# Patient Record
Sex: Male | Born: 1945 | Race: White | Hispanic: No | Marital: Single | Smoking: Light tobacco smoker
Health system: Southern US, Community
[De-identification: ages and names within clinical notes are randomized; demographics above are authoritative.]

## PROBLEM LIST (undated history)

## (undated) DIAGNOSIS — M069 Rheumatoid arthritis, unspecified: Secondary | ICD-10-CM

## (undated) DIAGNOSIS — N529 Male erectile dysfunction, unspecified: Secondary | ICD-10-CM

## (undated) DIAGNOSIS — R569 Unspecified convulsions: Secondary | ICD-10-CM

## (undated) DIAGNOSIS — K219 Gastro-esophageal reflux disease without esophagitis: Secondary | ICD-10-CM

## (undated) DIAGNOSIS — I4891 Unspecified atrial fibrillation: Secondary | ICD-10-CM

## (undated) DIAGNOSIS — I499 Cardiac arrhythmia, unspecified: Secondary | ICD-10-CM

## (undated) DIAGNOSIS — G20 Parkinson's disease: Secondary | ICD-10-CM

## (undated) DIAGNOSIS — B0229 Other postherpetic nervous system involvement: Secondary | ICD-10-CM

## (undated) DIAGNOSIS — M48061 Spinal stenosis, lumbar region without neurogenic claudication: Secondary | ICD-10-CM

## (undated) DIAGNOSIS — R972 Elevated prostate specific antigen [PSA]: Secondary | ICD-10-CM

## (undated) HISTORY — DX: Unspecified convulsions: R56.9

## (undated) HISTORY — DX: Rheumatoid arthritis, unspecified: M06.9

## (undated) HISTORY — DX: Gastro-esophageal reflux disease without esophagitis: K21.9

## (undated) HISTORY — DX: Elevated prostate specific antigen (PSA): R97.20

## (undated) HISTORY — DX: Spinal stenosis, lumbar region without neurogenic claudication: M48.061

## (undated) HISTORY — PX: COLONOSCOPY: SHX174

## (undated) HISTORY — DX: Other postherpetic nervous system involvement: B02.29

## (undated) HISTORY — DX: Male erectile dysfunction, unspecified: N52.9

## (undated) HISTORY — PX: AMPUTATION: SHX166

## (undated) HISTORY — DX: Parkinson's disease: G20

---

## 2004-04-03 ENCOUNTER — Ambulatory Visit: Payer: Self-pay | Admitting: Internal Medicine

## 2004-04-11 ENCOUNTER — Ambulatory Visit: Payer: Self-pay | Admitting: Internal Medicine

## 2005-05-01 ENCOUNTER — Ambulatory Visit: Payer: Self-pay | Admitting: Internal Medicine

## 2005-05-08 ENCOUNTER — Ambulatory Visit: Payer: Self-pay | Admitting: Internal Medicine

## 2006-06-26 ENCOUNTER — Ambulatory Visit: Payer: Self-pay | Admitting: Internal Medicine

## 2006-06-26 LAB — CONVERTED CEMR LAB
Albumin: 3.9 g/dL (ref 3.5–5.2)
Basophils Absolute: 0 10*3/uL (ref 0.0–0.1)
Bilirubin, Direct: 0.1 mg/dL (ref 0.0–0.3)
Chloride: 106 meq/L (ref 96–112)
Cholesterol: 184 mg/dL (ref 0–200)
Eosinophils Absolute: 0.2 10*3/uL (ref 0.0–0.6)
Eosinophils Relative: 3.2 % (ref 0.0–5.0)
GFR calc Af Amer: 99 mL/min
GFR calc non Af Amer: 82 mL/min
Glucose, Bld: 104 mg/dL — ABNORMAL HIGH (ref 70–99)
HCT: 44.6 % (ref 39.0–52.0)
Lymphocytes Relative: 26.7 % (ref 12.0–46.0)
MCHC: 33.8 g/dL (ref 30.0–36.0)
MCV: 88.9 fL (ref 78.0–100.0)
Monocytes Absolute: 0.6 10*3/uL (ref 0.2–0.7)
Neutro Abs: 4.3 10*3/uL (ref 1.4–7.7)
Neutrophils Relative %: 61.5 % (ref 43.0–77.0)
PSA: 1.07 ng/mL (ref 0.10–4.00)
Potassium: 4.2 meq/L (ref 3.5–5.1)
RBC: 5.02 M/uL (ref 4.22–5.81)
Sodium: 139 meq/L (ref 135–145)
TSH: 1.34 microintl units/mL (ref 0.35–5.50)
Total CHOL/HDL Ratio: 3.7

## 2006-07-03 ENCOUNTER — Ambulatory Visit: Payer: Self-pay | Admitting: Internal Medicine

## 2007-03-10 ENCOUNTER — Telehealth (INDEPENDENT_AMBULATORY_CARE_PROVIDER_SITE_OTHER): Payer: Self-pay | Admitting: *Deleted

## 2007-03-12 ENCOUNTER — Telehealth: Payer: Self-pay | Admitting: Internal Medicine

## 2007-06-24 ENCOUNTER — Emergency Department (HOSPITAL_COMMUNITY): Admission: EM | Admit: 2007-06-24 | Discharge: 2007-06-24 | Payer: Self-pay | Admitting: Emergency Medicine

## 2007-06-25 ENCOUNTER — Telehealth (INDEPENDENT_AMBULATORY_CARE_PROVIDER_SITE_OTHER): Payer: Self-pay | Admitting: *Deleted

## 2007-06-28 ENCOUNTER — Telehealth: Payer: Self-pay | Admitting: Internal Medicine

## 2007-06-29 ENCOUNTER — Ambulatory Visit: Payer: Self-pay | Admitting: Internal Medicine

## 2007-06-29 DIAGNOSIS — R972 Elevated prostate specific antigen [PSA]: Secondary | ICD-10-CM

## 2007-06-29 DIAGNOSIS — E785 Hyperlipidemia, unspecified: Secondary | ICD-10-CM | POA: Insufficient documentation

## 2007-06-29 HISTORY — DX: Elevated prostate specific antigen (PSA): R97.20

## 2007-06-29 LAB — CONVERTED CEMR LAB
Cholesterol: 155 mg/dL (ref 0–200)
HDL: 34.2 mg/dL — ABNORMAL LOW (ref >39.0)
Triglycerides: 47 mg/dL (ref 0–149)
VLDL: 9 mg/dL (ref 0–40)

## 2007-07-01 ENCOUNTER — Ambulatory Visit: Payer: Self-pay | Admitting: Internal Medicine

## 2007-07-01 DIAGNOSIS — R569 Unspecified convulsions: Secondary | ICD-10-CM | POA: Insufficient documentation

## 2007-07-01 DIAGNOSIS — K219 Gastro-esophageal reflux disease without esophagitis: Secondary | ICD-10-CM | POA: Insufficient documentation

## 2007-07-01 DIAGNOSIS — K209 Esophagitis, unspecified without bleeding: Secondary | ICD-10-CM | POA: Insufficient documentation

## 2007-07-01 HISTORY — DX: Gastro-esophageal reflux disease without esophagitis: K21.9

## 2007-07-01 HISTORY — DX: Unspecified convulsions: R56.9

## 2007-07-06 ENCOUNTER — Telehealth: Payer: Self-pay | Admitting: Internal Medicine

## 2007-07-07 ENCOUNTER — Telehealth: Payer: Self-pay | Admitting: Internal Medicine

## 2007-07-07 ENCOUNTER — Ambulatory Visit (HOSPITAL_COMMUNITY): Admission: RE | Admit: 2007-07-07 | Discharge: 2007-07-07 | Payer: Self-pay | Admitting: Internal Medicine

## 2007-07-12 ENCOUNTER — Encounter: Payer: Self-pay | Admitting: Internal Medicine

## 2007-07-13 ENCOUNTER — Telehealth: Payer: Self-pay | Admitting: Internal Medicine

## 2007-07-19 ENCOUNTER — Telehealth: Payer: Self-pay | Admitting: Internal Medicine

## 2007-07-23 ENCOUNTER — Encounter: Payer: Self-pay | Admitting: Internal Medicine

## 2007-08-26 ENCOUNTER — Telehealth (INDEPENDENT_AMBULATORY_CARE_PROVIDER_SITE_OTHER): Payer: Self-pay | Admitting: *Deleted

## 2007-09-13 ENCOUNTER — Ambulatory Visit: Payer: Self-pay | Admitting: Internal Medicine

## 2007-09-13 DIAGNOSIS — B0229 Other postherpetic nervous system involvement: Secondary | ICD-10-CM | POA: Insufficient documentation

## 2007-09-13 DIAGNOSIS — B029 Zoster without complications: Secondary | ICD-10-CM | POA: Insufficient documentation

## 2007-09-13 HISTORY — DX: Other postherpetic nervous system involvement: B02.29

## 2007-12-01 ENCOUNTER — Ambulatory Visit: Payer: Self-pay | Admitting: Internal Medicine

## 2007-12-01 DIAGNOSIS — L259 Unspecified contact dermatitis, unspecified cause: Secondary | ICD-10-CM | POA: Insufficient documentation

## 2007-12-13 ENCOUNTER — Telehealth: Payer: Self-pay | Admitting: Family Medicine

## 2008-01-11 ENCOUNTER — Telehealth: Payer: Self-pay | Admitting: Internal Medicine

## 2008-06-28 ENCOUNTER — Ambulatory Visit: Payer: Self-pay | Admitting: Internal Medicine

## 2008-06-28 LAB — CONVERTED CEMR LAB
Alkaline Phosphatase: 49 units/L (ref 39–117)
Basophils Absolute: 0.1 10*3/uL (ref 0.0–0.1)
Bilirubin Urine: NEGATIVE
Bilirubin, Direct: 0.1 mg/dL (ref 0.0–0.3)
Blood in Urine, dipstick: NEGATIVE
Calcium: 8.8 mg/dL (ref 8.4–10.5)
Cholesterol: 164 mg/dL (ref 0–200)
GFR calc Af Amer: 72 mL/min
GFR calc non Af Amer: 60 mL/min
Glucose, Urine, Semiquant: NEGATIVE
HCT: 35.2 % — ABNORMAL LOW (ref 39.0–52.0)
HDL: 32.7 mg/dL — ABNORMAL LOW (ref >39.0)
Hemoglobin: 11.7 g/dL — ABNORMAL LOW (ref 13.0–17.0)
Ketones, urine, test strip: NEGATIVE
LDL Cholesterol: 117 mg/dL — ABNORMAL HIGH (ref 0–99)
MCHC: 33.3 g/dL (ref 30.0–36.0)
Monocytes Absolute: 0.6 10*3/uL (ref 0.1–1.0)
Neutro Abs: 3.6 10*3/uL (ref 1.4–7.7)
Nitrite: NEGATIVE
Platelets: 214 10*3/uL (ref 150–400)
Potassium: 3.9 meq/L (ref 3.5–5.1)
Protein, U semiquant: NEGATIVE
RDW: 14.2 % (ref 11.5–14.6)
Sodium: 142 meq/L (ref 135–145)
Specific Gravity, Urine: 1.015
TSH: 1.61 microintl units/mL (ref 0.35–5.50)
Total Bilirubin: 0.8 mg/dL (ref 0.3–1.2)
Total CHOL/HDL Ratio: 5
Triglycerides: 70 mg/dL (ref 0–149)
Urobilinogen, UA: 0.2
pH: 5

## 2008-07-03 ENCOUNTER — Ambulatory Visit: Payer: Self-pay | Admitting: Internal Medicine

## 2008-07-04 ENCOUNTER — Telehealth: Payer: Self-pay | Admitting: Internal Medicine

## 2008-07-31 ENCOUNTER — Telehealth: Payer: Self-pay | Admitting: Internal Medicine

## 2009-07-06 ENCOUNTER — Ambulatory Visit: Payer: Self-pay | Admitting: Internal Medicine

## 2009-07-06 LAB — CONVERTED CEMR LAB
Albumin: 3.8 g/dL (ref 3.5–5.2)
Alkaline Phosphatase: 46 units/L (ref 39–117)
Basophils Relative: 0.6 % (ref 0.0–3.0)
CO2: 28 meq/L (ref 19–32)
Calcium: 9 mg/dL (ref 8.4–10.5)
Chloride: 107 meq/L (ref 96–112)
Eosinophils Absolute: 0.3 10*3/uL (ref 0.0–0.7)
Glucose, Bld: 93 mg/dL (ref 70–99)
HCT: 40.7 % (ref 39.0–52.0)
Hemoglobin: 13.6 g/dL (ref 13.0–17.0)
Lymphocytes Relative: 23.5 % (ref 12.0–46.0)
Lymphs Abs: 1.5 10*3/uL (ref 0.7–4.0)
MCHC: 33.3 g/dL (ref 30.0–36.0)
MCV: 88.8 fL (ref 78.0–100.0)
Neutro Abs: 4.2 10*3/uL (ref 1.4–7.7)
PSA: 1.2 ng/mL (ref 0.10–4.00)
Potassium: 4.2 meq/L (ref 3.5–5.1)
RBC: 4.58 M/uL (ref 4.22–5.81)
Sodium: 140 meq/L (ref 135–145)
Specific Gravity, Urine: 1.025 (ref 1.000–1.030)
TSH: 1.5 microintl units/mL (ref 0.35–5.50)
Total CHOL/HDL Ratio: 4
Total Protein: 7.5 g/dL (ref 6.0–8.3)
Triglycerides: 62 mg/dL (ref 0.0–149.0)
Urine Glucose: NEGATIVE mg/dL
Urobilinogen, UA: 0.2 (ref 0.0–1.0)

## 2009-07-17 ENCOUNTER — Ambulatory Visit: Payer: Self-pay | Admitting: Internal Medicine

## 2009-08-03 ENCOUNTER — Telehealth: Payer: Self-pay | Admitting: Internal Medicine

## 2010-06-05 NOTE — Progress Notes (Signed)
Summary: REQ FOR REFILL (Nexium)  Phone Note Call from Patient   Caller: Patient  [phone] Reason for Call: Refill Medication Summary of Call: Pt called to adv that he needs a refill on med: cialis (already done) and nexium...Marland KitchenMarland KitchenMarland Kitchen Pt adv that Rx for nexium can be sent to [facility].... Pt can be reached at [phone] with any questions or concerns.  Initial call taken by: Debbra Riding,  August 03, 2009 9:43 AM    Prescriptions: NEXIUM 40 MG PACK (ESOMEPRAZOLE MAGNESIUM) q day  #90 x 4   Entered by:   Duard Brady LPN   Authorized by:   Gordy Savers  MD   Signed by:   Duard Brady LPN on 95/28/4132   Method used:   Faxed to ...       [facility] [phone]* (retail)       [address]       [place], [place]  [address]       Ph: [phone]       Fax: [phone]   RxID:   [id number]

## 2010-06-05 NOTE — Assessment & Plan Note (Signed)
Summary: cpx/cjr   Vital Signs:  Patient profile:   65 year old male Height:      70 inches Weight:      230 pounds BMI:     33.12 Temp:     98.3 degrees F oral BP sitting:   152 / 80  (right arm) Cuff size:   regular  Vitals Entered By: Duard Brady LPN (July 17, 2009 8:52 AM) CC: cpx-no problems Is Patient Diabetic? No   CC:  cpx-no problems.  History of Present Illness: 65 old patient who is seen today for a wellness exam and he enjoys excellent health.  He had a single seizure approximately 2 years ago, which has not recurred.  Neurological evaluation was normal.  He has a history of gastroesophageal reflux disease and also a history of mild episodic ED.  No concerns or complaints.  Today  Preventive Screening-Counseling & Management  Alcohol-Tobacco     Smoking Status: current  Allergies (verified): No Known Drug Allergies  Past History:  Past Medical History: new-onset seizure disorder,February 2009 GERD ED  Past Surgical History: traumatic amputation, right great toe, age 79 colonoscopy 2006 or 2007  Family History: Reviewed history from 07/01/2007 and no changes required. father died age 36, history of COPD, coronary artery disease mother history of Zollinger-Ellison syndrome, diabetes  Grandfather history of throat cancer  No siblings except for a stepsister who is in good health  Social History: Reviewed history from 07/01/2007 and no changes required. quite active physically work Saturday, local gym 3 to 4 times weekly nonsmoker social drinker Occupation: Airline pilot Smoking Status:  current  Review of Systems  The patient denies anorexia, fever, weight loss, weight gain, vision loss, decreased hearing, hoarseness, chest pain, syncope, dyspnea on exertion, peripheral edema, prolonged cough, headaches, hemoptysis, abdominal pain, melena, hematochezia, severe indigestion/heartburn, hematuria, incontinence, genital sores, muscle weakness,  suspicious skin lesions, transient blindness, difficulty walking, depression, unusual weight change, abnormal bleeding, enlarged lymph nodes, angioedema, breast masses, and testicular masses.    Physical Exam  General:  Well-developed,well-nourished,in no acute distress; alert,appropriate and cooperative throughout examination Head:  Normocephalic and atraumatic without obvious abnormalities. No apparent alopecia or balding. Eyes:  No corneal or conjunctival inflammation noted. EOMI. Perrla. Funduscopic exam benign, without hemorrhages, exudates or papilledema. Vision grossly normal. Ears:  External ear exam shows no significant lesions or deformities.  Otoscopic examination reveals clear canals, tympanic membranes are intact bilaterally without bulging, retraction, inflammation or discharge. Hearing is grossly normal bilaterally. Nose:  External nasal examination shows no deformity or inflammation. Nasal mucosa are pink and moist without lesions or exudates. Mouth:  Oral mucosa and oropharynx without lesions or exudates.  Teeth in good repair. Neck:  No deformities, masses, or tenderness noted. Chest Wall:  No deformities, masses, tenderness or gynecomastia noted. Breasts:  No masses or gynecomastia noted Lungs:  Normal respiratory effort, chest expands symmetrically. Lungs are clear to auscultation, no crackles or wheezes. Heart:  Normal rate and regular rhythm. S1 and S2 normal without gallop, murmur, click, rub or other extra sounds. Abdomen:  Bowel sounds positive,abdomen soft and non-tender without masses, organomegaly or hernias noted. Rectal:  No external abnormalities noted. Normal sphincter tone. No rectal masses or tenderness. Genitalia:  Testes bilaterally descended without nodularity, tenderness or masses. No scrotal masses or lesions. No penis lesions or urethral discharge. Prostate:  left lobe, slightly more prominent Msk:  No deformity or scoliosis noted of thoracic or lumbar  spine.   Pulses:  dorsalis pedis pulses faint;  posterior tibial pulses full Extremities:  amputation right great toe and Neurologic:  No cranial nerve deficits noted. Station and gait are normal. Plantar reflexes are down-going bilaterally. DTRs are symmetrical throughout. Sensory, motor and coordinative functions appear intact. Skin:  Intact without suspicious lesions or rashes Cervical Nodes:  No lymphadenopathy noted Axillary Nodes:  No palpable lymphadenopathy Inguinal Nodes:  No significant adenopathy Psych:  Cognition and judgment appear intact. Alert and cooperative with normal attention span and concentration. No apparent delusions, illusions, hallucinations   Impression & Recommendations:  Problem # 1:  PREVENTIVE HEALTH CARE (ICD-V70.0)  Orders: EKG w/ Interpretation (93000)  Complete Medication List: 1)  Anacin 81 Mg Tbec (Aspirin) .... One daily 2)  Fish Oil Oil (Fish oil) 3)  Centrum Silver Tabs (Multiple vitamins-minerals) 4)  Cialis 20 Mg Tabs (Tadalafil) .... One daily and 5)  Claritin 10 Mg Tabs (Loratadine) .... One by mouth daily 6)  Nexium 40 Mg Pack (Esomeprazole magnesium) .... Q day  Patient Instructions: 1)  Please schedule a follow-up appointment in 1 year. 2)  Limit your Sodium (Salt). 3)  Avoid foods high in acid (tomatoes, citrus juices, spicy foods). Avoid eating within two hours of lying down or before exercising. Do not over eat; try smaller more frequent meals. Elevate head of bed twelve inches when sleeping. 4)  It is important that you exercise regularly at least 20 minutes 5 times a week. If you develop chest pain, have severe difficulty breathing, or feel very tired , stop exercising immediately and seek medical attention. Prescriptions: NEXIUM 40 MG PACK (ESOMEPRAZOLE MAGNESIUM) q day  #90 x 6   Entered and Authorized by:   Gordy Savers  MD   Signed by:   Gordy Savers  MD on 07/17/2009   Method used:   Print then Give to Patient    RxID:   [id number] CIALIS 20 MG  TABS (TADALAFIL) one daily and  #4 Tablet x 6   Entered and Authorized by:   Gordy Savers  MD   Signed by:   Gordy Savers  MD on 07/17/2009   Method used:   Print then Give to Patient   RxID:   [id number]

## 2010-07-25 ENCOUNTER — Other Ambulatory Visit: Payer: Self-pay

## 2010-07-26 ENCOUNTER — Other Ambulatory Visit (INDEPENDENT_AMBULATORY_CARE_PROVIDER_SITE_OTHER): Payer: BC Managed Care – PPO

## 2010-07-26 DIAGNOSIS — Z0000 Encounter for general adult medical examination without abnormal findings: Secondary | ICD-10-CM

## 2010-07-26 LAB — CBC WITH DIFFERENTIAL/PLATELET
Eosinophils Absolute: 0.3 10*3/uL (ref 0.0–0.7)
MCHC: 33.5 g/dL (ref 30.0–36.0)
MCV: 80.4 fl (ref 78.0–100.0)
Monocytes Absolute: 0.5 10*3/uL (ref 0.1–1.0)
Neutrophils Relative %: 60.3 % (ref 43.0–77.0)
Platelets: 210 10*3/uL (ref 150.0–400.0)
RDW: 16 % — ABNORMAL HIGH (ref 11.5–14.6)

## 2010-07-26 LAB — BASIC METABOLIC PANEL
BUN: 21 mg/dL (ref 6–23)
GFR: 69.67 mL/min (ref >60.00)
Potassium: 4.9 mEq/L (ref 3.5–5.1)
Sodium: 139 mEq/L (ref 135–145)

## 2010-07-26 LAB — HEPATIC FUNCTION PANEL
ALT: 15 U/L (ref 0–53)
Bilirubin, Direct: 0.1 mg/dL (ref 0.0–0.3)
Total Protein: 6.7 g/dL (ref 6.0–8.3)

## 2010-07-26 LAB — LIPID PANEL
Cholesterol: 165 mg/dL (ref 0–200)
HDL: 43.9 mg/dL (ref >39.00)
VLDL: 8.8 mg/dL (ref 0.0–40.0)

## 2010-07-26 LAB — URINALYSIS, ROUTINE W REFLEX MICROSCOPIC
Bilirubin Urine: NEGATIVE
Hgb urine dipstick: NEGATIVE
Ketones, ur: NEGATIVE
Nitrite: POSITIVE
Total Protein, Urine: NEGATIVE

## 2010-07-31 ENCOUNTER — Encounter: Payer: Self-pay | Admitting: Internal Medicine

## 2010-08-01 ENCOUNTER — Encounter: Payer: Self-pay | Admitting: Internal Medicine

## 2010-08-01 ENCOUNTER — Ambulatory Visit (INDEPENDENT_AMBULATORY_CARE_PROVIDER_SITE_OTHER): Payer: BC Managed Care – PPO | Admitting: Internal Medicine

## 2010-08-01 VITALS — BP 128/70 | HR 62 | Temp 98.1°F | Resp 14 | Ht 71.0 in | Wt 230.0 lb

## 2010-08-01 DIAGNOSIS — Z0000 Encounter for general adult medical examination without abnormal findings: Secondary | ICD-10-CM

## 2010-08-01 DIAGNOSIS — Z136 Encounter for screening for cardiovascular disorders: Secondary | ICD-10-CM

## 2010-08-01 MED ORDER — TADALAFIL 20 MG PO TABS: 20.0000 mg | ORAL_TABLET | Freq: Every day | ORAL | Status: DC | PRN

## 2010-08-01 NOTE — Patient Instructions (Signed)
It is important that you exercise regularly, at least 20 minutes 3 to 4 times per week.  If you develop chest pain or shortness of breath seek  medical attention.  Limit your sodium (Salt) intake  Avoids foods high in acid such as tomatoes citrus juices, and spicy foods.  Avoid eating within two hours of lying down or before exercising.  Do not overheat.  Try smaller more frequent meals.  If symptoms persist, elevate the head of her bed 12 inches while sleeping.  Return in one year for follow-up

## 2010-08-01 NOTE — Progress Notes (Signed)
  Subjective:    Patient ID: Samuel Moran, male    DOB: [birth year], 65 y.o.   MRN: [id number]  HPI  65 year old patient who is seen today for a annual health exam. He has done quite well. He does have remote history of a seizure that occurred 3 years ago without recurrence. He exercises regularly and has done quite well he takes no chronic medications except for ED.    Review of Systems  Constitutional: Negative for fever, chills, activity change, appetite change and fatigue.  HENT: Negative for hearing loss, ear pain, congestion, rhinorrhea, sneezing, mouth sores, trouble swallowing, neck pain, neck stiffness, dental problem, voice change, sinus pressure and tinnitus.   Eyes: Negative for photophobia, pain, redness and visual disturbance.  Respiratory: Negative for apnea, cough, choking, chest tightness, shortness of breath and wheezing.   Cardiovascular: Negative for chest pain, palpitations and leg swelling.  Gastrointestinal: Negative for nausea, vomiting, abdominal pain, diarrhea, constipation, blood in stool, abdominal distention, anal bleeding and rectal pain.  Genitourinary: Negative for dysuria, urgency, frequency, hematuria, flank pain, decreased urine volume, discharge, penile swelling, scrotal swelling, difficulty urinating, genital sores and testicular pain.  Musculoskeletal: Negative for myalgias, back pain, joint swelling, arthralgias and gait problem.  Skin: Negative for color change, rash and wound.  Neurological: Negative for dizziness, tremors, seizures, syncope, facial asymmetry, speech difficulty, weakness, light-headedness, numbness and headaches.  Hematological: Negative for adenopathy. Does not bruise/bleed easily.  Psychiatric/Behavioral: Negative for suicidal ideas, hallucinations, behavioral problems, confusion, sleep disturbance, self-injury, dysphoric mood, decreased concentration and agitation. The patient is not nervous/anxious.        Objective:   Physical  Exam  Constitutional: He appears well-developed and well-nourished.  HENT:  Head: Normocephalic and atraumatic.  Right Ear: External ear normal.  Left Ear: External ear normal.  Nose: Nose normal.  Mouth/Throat: Oropharynx is clear and moist.  Eyes: Conjunctivae and EOM are normal. Pupils are equal, round, and reactive to light. No scleral icterus.  Neck: Normal range of motion. Neck supple. No JVD present. No thyromegaly present.  Cardiovascular: Regular rhythm, normal heart sounds and intact distal pulses.  Exam reveals no gallop and no friction rub.   No murmur heard. Pulmonary/Chest: Effort normal and breath sounds normal. He exhibits no tenderness.  Abdominal: Soft. Bowel sounds are normal. He exhibits no distension and no mass. There is no tenderness.  Genitourinary: Prostate normal and penis normal.  Musculoskeletal: Normal range of motion. He exhibits no edema and no tenderness.       Amputated great toe  Lymphadenopathy:    He has no cervical adenopathy.  Neurological: He is alert. He has normal reflexes. No cranial nerve deficit. Coordination normal.  Skin: Skin is warm and dry. No rash noted.  Psychiatric: He has a normal mood and affect. His behavior is normal.          Assessment & Plan:  Unremarkable preventative health examination. We'll continue low-salt heart healthy diet and regular exercise program. He walks and exercises several times per week. He does have a history of mild reflux that is presently controlled off medication. Laboratory studies were reviewed and these were unremarkable. PSA was less than 2. He did have a history of mild anemia that has been sporadic over time related to blood donations. He did have a colonoscopy in 2006. We'll reassess in one year

## 2010-08-08 ENCOUNTER — Telehealth: Payer: Self-pay | Admitting: *Deleted

## 2010-08-08 NOTE — Telephone Encounter (Signed)
Pt got his Cialis which he thought was to be #12, but only got #2 for 52.00.   Would like to speak to someone that correct this.

## 2010-08-12 NOTE — Telephone Encounter (Signed)
[facility] - pt pic'd [facility] 4 pills earlier in month - ins , then pic'd up 2 on 3/29 paid cash . They do have our rx to disp # 12 .   Attempted to call pt on cell# - left msg r/t he needed to check with ins about how many and what they will cover in a month - otherwise , unfortunatly - he will have to pay out of pocket. KIK

## 2010-09-17 NOTE — Procedures (Signed)
EEG NUMBER:  [id number]   Ordered by Gordy Savers, M.D.   This is a 65 year old man with a history of seizure a few weeks ago and  has no prior history of seizure in the past.  He recalls walking out of  the grocery store, remembers seeing his car, and then woke up in an  ambulance.  He does not remember anything in between.   MEDICATIONS:  Baby aspirin and Fish oil.   This was a routine 17-channel EEG with one channel devoted to EKG  utilizing [employer] 10/20 lead placement system.  The patient was  described clinically as being awake and drowsy.  Electrographically he  also appeared to be awake and drowsy.  While awake, the background  consisted of a well-organized, well-developed, well-modulated, 9 Hz  alpha activity which was predominant in the posterior head regions and  reactive to eye opening.  No interhemispheric asymmetry is identified  and no definite epileptiform discharges were seen.  Both photic  stimulation and hypoventilation were performed and neither of them  produced any significant change in the background activity.  During  drowsiness there was an attenuation in the background with decrease in  frequency and amplitude.  The EKG monitor revealed there was a  relatively regular rhythm with a rate of 66 beats per minute.   CONCLUSION:  Normal EEG in the awake and drowsy states without focal  abnormality or seizure activity seen during the course of today's  recording.  Clinical correlation is recommended.      Catherine A. Orlin Hilding, M.D.  Electronically Signed     EAV:[id number]  D:  07/07/2007 11:58:09  T:  07/07/2007 12:32:15  Job #:  [id number]

## 2010-09-20 NOTE — Assessment & Plan Note (Signed)
[facility] OFFICE NOTE   Samuel Moran, Samuel Moran                    MRN:          [id number]  DATE:07/03/2006                            DOB:          Dec 11, 1945    A 65 year old gentleman seen today for a annual exam.  He enjoys  excellent health and takes no chronic medications.  There is a history  of a traumatic right great toe amputation at age 71.  He has some mild  ED difficulties controlled with Viagra.  Remains quite physically active  without cardiopulmonary complaints or physical limitations.   REVIEW OF SYSTEMS:  Negative.   FAMILY HISTORY:  Unchanged.   EXAMINATION:  Revealed a healthy appearing, fit male, in no acute  distress.  Blood pressure was down to 120/80 on repeat.  SKIN:  Negative,  FUNDI, EAR, NOSE, AND THROAT:  Unremarkable.  NECK:  So bruits and adenopathy.  CHEST:  Clear.  CARDIOVASCULAR:  Normal heart sounds, no murmurs.  ABDOMEN:  Benign.  EXTERNAL GENITALIA:  Normal.  EXTREMITIES:  Negative, intact peripheral pulses.  Right great toe was  absent.  RECTAL:  Minimally large prostate, heme negative stool.  NEURO:  Negative.   IMPRESSION:  Unremarkable clinical examination.   DISPOSITION:  Will continue p.r.n. Viagra, laboratory studies were  reviewed, there were unremarkable.  Recheck in one year.     Gordy Savers, MD  Electronically Signed    PFK/MedQ  DD: 07/03/2006  DT: 07/03/2006  Job #: [id number]

## 2010-11-29 ENCOUNTER — Ambulatory Visit (INDEPENDENT_AMBULATORY_CARE_PROVIDER_SITE_OTHER): Payer: BC Managed Care – PPO | Admitting: Family Medicine

## 2010-11-29 ENCOUNTER — Encounter: Payer: Self-pay | Admitting: Family Medicine

## 2010-11-29 VITALS — BP 120/82 | HR 69 | Temp 99.1°F | Wt 230.0 lb

## 2010-11-29 DIAGNOSIS — N390 Urinary tract infection, site not specified: Secondary | ICD-10-CM

## 2010-11-29 LAB — POCT URINALYSIS DIPSTICK
Ketones, UA: NEGATIVE
Nitrite, UA: NEGATIVE
Protein, UA: NEGATIVE
Urobilinogen, UA: 0.2

## 2010-11-29 MED ORDER — CIPROFLOXACIN HCL 500 MG PO TABS: 500.0000 mg | ORAL_TABLET | Freq: Two times a day (BID) | ORAL | Status: AC

## 2010-11-29 NOTE — Progress Notes (Signed)
  Subjective:    Patient ID: Samuel Moran, male    DOB: 02-15-1946, 65 y.o.   MRN: [id number]  HPI Here after he noticed a tiny bit of blood in his urine last night and again this morning. No burning or urgency or fever.    Review of Systems  Constitutional: Negative.   Respiratory: Negative.   Cardiovascular: Negative.   Gastrointestinal: Negative.   Genitourinary: Positive for hematuria. Negative for dysuria, flank pain, difficulty urinating and testicular pain.       Objective:   Physical Exam  Constitutional: He appears well-developed and well-nourished.  Abdominal: Soft. Bowel sounds are normal. He exhibits no distension and no mass. There is no tenderness. There is no rebound and no guarding.  Genitourinary: Testes normal. Right testis shows no mass, no swelling and no tenderness. Left testis shows no mass, no swelling and no tenderness.          Assessment & Plan:   Treat with Cipro, drink plenty of water. Await culture results

## 2010-11-29 NOTE — Progress Notes (Signed)
Addended by: Aniceto Boss A on: 11/29/2010 03:04 PM   Modules accepted: Orders

## 2010-12-04 LAB — URINE CULTURE: Colony Count: >=100000

## 2010-12-05 NOTE — Progress Notes (Signed)
Spoke with pt and gave results. 

## 2011-01-27 LAB — CBC
MCHC: 34.2
Platelets: 222
RDW: 13.3

## 2011-01-27 LAB — DIFFERENTIAL
Basophils Absolute: 0
Basophils Relative: 0
Lymphocytes Relative: 12
Neutro Abs: 10.8 — ABNORMAL HIGH
Neutrophils Relative %: 82 — ABNORMAL HIGH

## 2011-01-27 LAB — RAPID URINE DRUG SCREEN, HOSP PERFORMED
Amphetamines: NOT DETECTED
Barbiturates: NOT DETECTED
Opiates: NOT DETECTED

## 2011-01-27 LAB — POCT I-STAT CREATININE
Creatinine, Ser: 1.4
Operator id: 270651

## 2011-01-27 LAB — I-STAT 8, (EC8 V) (CONVERTED LAB)
Acid-base deficit: 5 — ABNORMAL HIGH
Chloride: 106
HCT: 41
Operator id: 270651
Potassium: 3.6
TCO2: 20
pCO2, Ven: 31 — ABNORMAL LOW

## 2011-01-27 LAB — ETHANOL: Alcohol, Ethyl (B): 6

## 2011-05-15 ENCOUNTER — Telehealth: Payer: Self-pay | Admitting: *Deleted

## 2011-05-15 NOTE — Telephone Encounter (Signed)
Is asking if we have anymore flu vaccines, and if so, would like to schedule appt.

## 2011-05-15 NOTE — Telephone Encounter (Signed)
Notified pt. 

## 2011-05-15 NOTE — Telephone Encounter (Signed)
We don not have any more at this time - I have not been told we will be ordering any more either

## 2011-07-28 ENCOUNTER — Other Ambulatory Visit: Payer: BC Managed Care – PPO

## 2011-07-30 ENCOUNTER — Other Ambulatory Visit (INDEPENDENT_AMBULATORY_CARE_PROVIDER_SITE_OTHER): Payer: BC Managed Care – PPO

## 2011-07-30 DIAGNOSIS — Z0000 Encounter for general adult medical examination without abnormal findings: Secondary | ICD-10-CM

## 2011-07-30 LAB — CBC WITH DIFFERENTIAL/PLATELET
Eosinophils Absolute: 0.4 10*3/uL (ref 0.0–0.7)
Eosinophils Relative: 5.1 % — ABNORMAL HIGH (ref 0.0–5.0)
HCT: 42.7 % (ref 39.0–52.0)
Lymphs Abs: 1.8 10*3/uL (ref 0.7–4.0)
MCHC: 33.6 g/dL (ref 30.0–36.0)
MCV: 92 fl (ref 78.0–100.0)
Monocytes Absolute: 0.5 10*3/uL (ref 0.1–1.0)
Platelets: 185 10*3/uL (ref 150.0–400.0)
WBC: 7 10*3/uL (ref 4.5–10.5)

## 2011-07-30 LAB — HEPATIC FUNCTION PANEL
ALT: 13 U/L (ref 0–53)
Total Bilirubin: 1 mg/dL (ref 0.3–1.2)
Total Protein: 7.1 g/dL (ref 6.0–8.3)

## 2011-07-30 LAB — BASIC METABOLIC PANEL
BUN: 22 mg/dL (ref 6–23)
CO2: 26 mEq/L (ref 19–32)
Chloride: 104 mEq/L (ref 96–112)
Creatinine, Ser: 1.2 mg/dL (ref 0.4–1.5)
Glucose, Bld: 96 mg/dL (ref 70–99)
Potassium: 4.8 mEq/L (ref 3.5–5.1)

## 2011-07-30 LAB — POCT URINALYSIS DIPSTICK
Blood, UA: NEGATIVE
Glucose, UA: NEGATIVE
Nitrite, UA: NEGATIVE
Urobilinogen, UA: 0.2
pH, UA: 6.5

## 2011-07-30 LAB — PSA: PSA: 0.65 ng/mL (ref 0.10–4.00)

## 2011-07-30 LAB — TSH: TSH: 1.38 u[IU]/mL (ref 0.35–5.50)

## 2011-07-30 LAB — LIPID PANEL: Total CHOL/HDL Ratio: 4

## 2011-08-04 ENCOUNTER — Encounter: Payer: Self-pay | Admitting: Internal Medicine

## 2011-08-04 ENCOUNTER — Ambulatory Visit (INDEPENDENT_AMBULATORY_CARE_PROVIDER_SITE_OTHER): Payer: BC Managed Care – PPO | Admitting: Internal Medicine

## 2011-08-04 VITALS — BP 118/70 | HR 56 | Temp 98.5°F | Resp 16 | Ht 71.0 in | Wt 232.0 lb

## 2011-08-04 DIAGNOSIS — Z0000 Encounter for general adult medical examination without abnormal findings: Secondary | ICD-10-CM

## 2011-08-04 MED ORDER — TADALAFIL 20 MG PO TABS: 20.0000 mg | ORAL_TABLET | Freq: Every day | ORAL | Status: DC | PRN

## 2011-08-04 NOTE — Progress Notes (Signed)
Patient ID: Samuel Moran, male   DOB: 08/13/1945, 66 y.o.   MRN: [id number]  Subjective:    Patient ID: Samuel Moran, male    DOB: 08-22-1945, 66 y.o.   MRN: [id number]  HPI  66 year old patient who is seen today for a annual health exam. He has done quite well. He does have remote history of a seizure that occurred 4 and alert gentleman doing for a 1.7 for her and including today things for years ago without recurrence. He exercises regularly and has done quite well he takes no chronic medications except for ED.    Review of Systems  Constitutional: Negative for fever, chills, activity change, appetite change and fatigue.  HENT: Negative for hearing loss, ear pain, congestion, rhinorrhea, sneezing, mouth sores, trouble swallowing, neck pain, neck stiffness, dental problem, voice change, sinus pressure and tinnitus.   Eyes: Negative for photophobia, pain, redness and visual disturbance.  Respiratory: Negative for apnea, cough, choking, chest tightness, shortness of breath and wheezing.   Cardiovascular: Negative for chest pain, palpitations and leg swelling.  Gastrointestinal: Negative for nausea, vomiting, abdominal pain, diarrhea, constipation, blood in stool, abdominal distention, anal bleeding and rectal pain.  Genitourinary: Negative for dysuria, urgency, frequency, hematuria, flank pain, decreased urine volume, discharge, penile swelling, scrotal swelling, difficulty urinating, genital sores and testicular pain.  Musculoskeletal: Negative for myalgias, back pain, joint swelling, arthralgias and gait problem.  Skin: Negative for color change, rash and wound.  Neurological: Negative for dizziness, tremors, seizures, syncope, facial asymmetry, speech difficulty, weakness, light-headedness, numbness and headaches.  Hematological: Negative for adenopathy. Does not bruise/bleed easily.  Psychiatric/Behavioral: Negative for suicidal ideas, hallucinations, behavioral problems, confusion,  sleep disturbance, self-injury, dysphoric mood, decreased concentration and agitation. The patient is not nervous/anxious.        Objective:   Physical Exam  Constitutional: He appears well-developed and well-nourished.  HENT:  Head: Normocephalic and atraumatic.  Right Ear: External ear normal.  Left Ear: External ear normal.  Nose: Nose normal.  Mouth/Throat: Oropharynx is clear and moist.  Eyes: Conjunctivae and EOM are normal. Pupils are equal, round, and reactive to light. No scleral icterus.  Neck: Normal range of motion. Neck supple. No JVD present. No thyromegaly present.  Cardiovascular: Regular rhythm, normal heart sounds and intact distal pulses.  Exam reveals no gallop and no friction rub.   No murmur heard. Pulmonary/Chest: Effort normal and breath sounds normal. He exhibits no tenderness.  Abdominal: Soft. Bowel sounds are normal. He exhibits no distension and no mass. There is no tenderness.  Genitourinary: Prostate normal and penis normal.  Musculoskeletal: Normal range of motion. He exhibits no edema and no tenderness.       Amputated great toe  Lymphadenopathy:    He has no cervical adenopathy.  Neurological: He is alert. He has normal reflexes. No cranial nerve deficit. Coordination normal.  Skin: Skin is warm and dry. No rash noted.  Psychiatric: He has a normal mood and affect. His behavior is normal.          Assessment & Plan:  Unremarkable preventative health examination. We'll continue low-salt heart healthy diet and regular exercise program. He walks and exercises several times per week. He does have a history of mild reflux that is presently controlled off medication. Laboratory studies were reviewed and these were unremarkable. PSA was less than 2. He did have a history of mild anemia that has been sporadic over time related to blood donations. He did have a colonoscopy in 2006.  We'll reassess in 2016.

## 2011-08-04 NOTE — Patient Instructions (Signed)
It is important that you exercise regularly, at least 20 minutes 3 to 4 times per week.  If you develop chest pain or shortness of breath seek  medical attention.   

## 2012-03-06 ENCOUNTER — Other Ambulatory Visit: Payer: Self-pay | Admitting: Internal Medicine

## 2012-04-19 ENCOUNTER — Telehealth: Payer: Self-pay | Admitting: Internal Medicine

## 2012-04-19 NOTE — Telephone Encounter (Signed)
Patient Information:  Caller Name: Jshawn  Phone: [phone]  Patient: Samuel Moran, Samuel Moran  Gender: Male  DOB: 1946-01-06  Age: 66 Years  PCP: Eleonore Chiquito [facility])  Office Follow Up:  Does the office need to follow up with this patient?: No  Instructions For The Office: N/A  RN Note:  Cough is dry and present mostly upon laying down.  Denies difficulty breathing.  Symptoms  Reason For Call & Symptoms: Cough  Reviewed Health History In EMR: Yes  Reviewed Medications In EMR: Yes  Reviewed Allergies In EMR: Yes  Reviewed Surgeries / Procedures: Yes  Date of Onset of Symptoms: 03/29/2012  Guideline(s) Used:  Cough  Disposition Per Guideline:   See Within 3 Days in Office  Reason For Disposition Reached:   Cough has been present for > 10 days  Advice Given:  Prevent Dehydration:  Drink adequate liquids.  This will help soothe an irritated or dry throat and loosen up the phlegm.  Call Back If:  Difficulty breathing  You become worse.  Appointment Scheduled:  04/20/2012 08:30:00 Appointment Scheduled Provider:  Eleonore Chiquito [facility])

## 2012-04-20 ENCOUNTER — Ambulatory Visit (INDEPENDENT_AMBULATORY_CARE_PROVIDER_SITE_OTHER): Payer: BC Managed Care – PPO | Admitting: Internal Medicine

## 2012-04-20 ENCOUNTER — Encounter: Payer: Self-pay | Admitting: Internal Medicine

## 2012-04-20 VITALS — BP 160/90 | HR 77 | Temp 98.2°F | Resp 18 | Wt 240.0 lb

## 2012-04-20 DIAGNOSIS — J069 Acute upper respiratory infection, unspecified: Secondary | ICD-10-CM

## 2012-04-20 DIAGNOSIS — N529 Male erectile dysfunction, unspecified: Secondary | ICD-10-CM

## 2012-04-20 DIAGNOSIS — K219 Gastro-esophageal reflux disease without esophagitis: Secondary | ICD-10-CM

## 2012-04-20 LAB — TESTOSTERONE: Testosterone: 406.74 ng/dL (ref 350.00–890.00)

## 2012-04-20 NOTE — Progress Notes (Signed)
Continue Cialis as needed 

## 2012-04-20 NOTE — Patient Instructions (Addendum)
It is important that you exercise regularly, at least 20 minutes 3 to 4 times per week.  If you develop chest pain or shortness of breath seek  medical attention.  Call or return to clinic prn if these symptoms worsen or fail to improve as anticipated.  Return in 6 months for follow-up 

## 2012-04-20 NOTE — Progress Notes (Signed)
Subjective:    Patient ID: Samuel Moran, male    DOB: [birth year], 66 y.o.   MRN: [id number]  HPI  66 year old patient who presents with chief complaint of chronic cough. This has been present for approximately 3 weeks following a URI. He describes a minimal postnasal drip but no significant sputum production.  No fever. Cough is described as mild and does not interfere with sleep or activities He has a history of erectile dysfunction and the suboptimal response with Cialis. He is requesting a testosterone level Past Medical History  Diagnosis Date  . GERD 07/01/2007  . HYPERLIPIDEMIA 06/29/2007  . 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 PROSTATE SPECIFIC ANTIGEN, ELEVATED 06/29/2007  . SEIZURE DISORDER 07/01/2007  . ED (erectile dysfunction)     History   Social History  . Marital Status: Single    Spouse Name: N/A    Number of Children: N/A  . Years of Education: N/A   Occupational History  . Not on file.   Social History Main Topics  . Smoking status: Never Smoker   . Smokeless tobacco: Never Used  . Alcohol Use: 2.0 oz/week    4 drink(s) per week  . Drug Use: No  . Sexually Active: Not on file   Other Topics Concern  . Not on file   Social History Narrative  . No narrative on file    Past Surgical History  Procedure Date  . Amputation     right great toe - tramatic age 66    No family history on file.  No Known Allergies  Current Outpatient Prescriptions on File Prior to Visit  Medication Sig Dispense Refill  . aspirin 81 MG EC tablet Take 81 mg by mouth daily.        . Multiple Vitamins-Minerals (CENTRUM SILVER PO) Take by mouth daily.        . Omega-3 Fatty Acids (FISH OIL) 1000 MG CAPS Take by mouth daily.        Marland Kitchen omeprazole (PRILOSEC) 20 MG capsule Take 20 mg by mouth daily.        . tadalafil (CIALIS) 20 MG tablet Take 1 tablet (20 mg total) by mouth daily as needed.  12 tablet  12  . loratadine (CLARITIN) 10 MG tablet Take 10 mg by mouth daily.           BP 160/90  Pulse 77  Temp 98.2 F (36.8 C) (Oral)  Resp 18  Wt 240 lb (108.863 kg)  SpO2 97%      Review of Systems  Constitutional: Negative for fever, chills, appetite change and fatigue.  HENT: Positive for postnasal drip. Negative for hearing loss, ear pain, congestion, sore throat, trouble swallowing, neck stiffness, dental problem, voice change and tinnitus.   Eyes: Negative for pain, discharge and visual disturbance.  Respiratory: Positive for cough. Negative for chest tightness, wheezing and stridor.   Cardiovascular: Negative for chest pain, palpitations and leg swelling.  Gastrointestinal: Negative for nausea, vomiting, abdominal pain, diarrhea, constipation, blood in stool and abdominal distention.  Genitourinary: Negative for urgency, hematuria, flank pain, discharge, difficulty urinating and genital sores.  Musculoskeletal: Negative for myalgias, back pain, joint swelling, arthralgias and gait problem.  Skin: Negative for rash.  Neurological: Negative for dizziness, syncope, speech difficulty, weakness, numbness and headaches.  Hematological: Negative for adenopathy. Does not bruise/bleed easily.  Psychiatric/Behavioral: Negative for behavioral problems and dysphoric mood. The patient is not nervous/anxious.        Objective:   Physical Exam  Constitutional: He is oriented to person, place, and time. He appears well-developed.  HENT:  Head: Normocephalic.  Right Ear: External ear normal.  Left Ear: External ear normal.  Eyes: Conjunctivae normal and EOM are normal.  Neck: Normal range of motion.  Cardiovascular: Normal rate and normal heart sounds.   Pulmonary/Chest: Breath sounds normal.  Abdominal: Bowel sounds are normal.  Musculoskeletal: Normal range of motion. He exhibits no edema and no tenderness.  Neurological: He is alert and oriented to person, place, and time.  Psychiatric: He has a normal mood and affect. His behavior is normal.           Assessment & Plan:   Resolving URI with  post viral cough syndrome.  Options were discussed we'll try Claritin to minimize postnasal drip and hopefully secondarily help cough. We'll call if unimproved ED. We'll check a testosterone level

## 2012-05-25 ENCOUNTER — Telehealth: Payer: Self-pay | Admitting: Internal Medicine

## 2012-05-25 NOTE — Telephone Encounter (Signed)
Patient Information:  Caller Name: Maclean  Phone: [phone]  Patient: Kameron, Blethen  Gender: Male  DOB: 03-14-1946  Age: 67 Years  PCP: Eleonore [facility])  Office Follow Up:  Does the office need to follow up with this patient?: No  Instructions For The Office: N/A  RN Note:  offered to schedule appt, but Justan had another appt at 0800 1/22 and will call back tomorrow about an appt; denies any back/flank pain  Symptoms  Reason For Call & Symptoms: was straining to have a BM and there was blood in the urine; has not went since; feels fine; denies blood in stool  Reviewed Health History In EMR: Yes  Reviewed Medications In EMR: Yes  Reviewed Allergies In EMR: Yes  Reviewed Surgeries / Procedures: Yes  Date of Onset of Symptoms: 05/25/2012  Guideline(s) Used:  Urination Pain - Male  No Protocol Available - Sick Adult  Disposition Per Guideline:   See Within 3 Days in Office  Reason For Disposition Reached:   Nursing judgment  Advice Given:  Call Back If:  You become worse.

## 2012-08-23 ENCOUNTER — Telehealth: Payer: Self-pay | Admitting: Internal Medicine

## 2012-08-23 NOTE — Telephone Encounter (Signed)
Patient is calling concerning the cost of his Cialis 20mg  .  He states he is currently paying out of pocket for the medication and for two pills it is  $63.00.  (1) He needs a new Rx   (2) Is there a cheaper alternative (3)  Are there any samples in the office. PLEASE CONTACT PATIENT REGARDING COST OF MEDICATION AND ALTERNATIVES IS AVAILABLE. Patient phone - [phone] Pharmacy is [facility] [place].

## 2012-08-23 NOTE — Telephone Encounter (Signed)
Please notify patient that samples are available and he may come by for pickup. A new prescription for Levitra 20 mg may be less expensive; please call in a new prescription for #6 with refill x6

## 2012-08-24 MED ORDER — VARDENAFIL HCL 20 MG PO TABS: 20.0000 mg | ORAL_TABLET | Freq: Every day | ORAL | Status: DC | PRN

## 2012-08-24 NOTE — Telephone Encounter (Signed)
Spoke to pt told him I do have samples of Cialis 20 mg, can come by and pick up. Also sending new Rx Levitra 20 mg to pharmacy it may be less expensive. Pt verbalized understanding.

## 2012-10-18 ENCOUNTER — Encounter: Payer: Self-pay | Admitting: Internal Medicine

## 2012-10-18 ENCOUNTER — Ambulatory Visit (INDEPENDENT_AMBULATORY_CARE_PROVIDER_SITE_OTHER): Payer: BC Managed Care – PPO | Admitting: Internal Medicine

## 2012-10-18 VITALS — BP 142/80 | HR 67 | Temp 98.9°F | Resp 20 | Wt 232.0 lb

## 2012-10-18 DIAGNOSIS — R05 Cough: Secondary | ICD-10-CM

## 2012-10-18 DIAGNOSIS — R059 Cough, unspecified: Secondary | ICD-10-CM

## 2012-10-18 NOTE — Progress Notes (Signed)
Subjective:    Patient ID: Samuel Moran, male    DOB: 1945/09/03, 67 y.o.   MRN: [id number]  HPI  67 year old patient who has a history of dyslipidemia remote seizure disorder. He presents with a three-week history of mild nonproductive cough. He did have some allergy symptoms in the spring which remain as well with Claritin. He did obtain a new dog 3 months ago and symptoms have been present for 3 weeks. No nocturnal coughing.  Past Medical History  Diagnosis Date  . GERD 07/01/2007  . HYPERLIPIDEMIA 06/29/2007  . 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 PROSTATE SPECIFIC ANTIGEN, ELEVATED 06/29/2007  . SEIZURE DISORDER 07/01/2007  . ED (erectile dysfunction)     History   Social History  . Marital Status: Single    Spouse Name: N/A    Number of Children: N/A  . Years of Education: N/A   Occupational History  . Not on file.   Social History Main Topics  . Smoking status: Never Smoker   . Smokeless tobacco: Never Used  . Alcohol Use: 2.0 oz/week    4 drink(s) per week  . Drug Use: No  . Sexually Active: Not on file   Other Topics Concern  . Not on file   Social History Narrative  . No narrative on file    Past Surgical History  Procedure Laterality Date  . Amputation      right great toe - tramatic age 17    No family history on file.  No Known Allergies  Current Outpatient Prescriptions on File Prior to Visit  Medication Sig Dispense Refill  . aspirin 81 MG EC tablet Take 81 mg by mouth daily.        . Multiple Vitamins-Minerals (CENTRUM SILVER PO) Take by mouth daily.        . Omega-3 Fatty Acids (FISH OIL) 1000 MG CAPS Take by mouth daily.        Marland Kitchen omeprazole (PRILOSEC) 20 MG capsule Take 20 mg by mouth daily.        . tadalafil (CIALIS) 20 MG tablet Take 1 tablet (20 mg total) by mouth daily as needed.  12 tablet  12  . vardenafil (LEVITRA) 20 MG tablet Take 1 tablet (20 mg total) by mouth daily as needed for erectile dysfunction.  6 tablet  6   No current  facility-administered medications on file prior to visit.    BP 142/80  Pulse 67  Temp(Src) 98.9 F (37.2 C) (Oral)  Resp 20  Wt 232 lb (105.235 kg)  BMI 32.37 kg/m2  SpO2 98%       Review of Systems  Constitutional: Negative for fever, chills, appetite change and fatigue.  HENT: Negative for hearing loss, ear pain, congestion, sore throat, trouble swallowing, neck stiffness, dental problem, voice change and tinnitus.   Eyes: Negative for pain, discharge and visual disturbance.  Respiratory: Negative for cough, chest tightness, wheezing and stridor.   Cardiovascular: Negative for chest pain, palpitations and leg swelling.  Gastrointestinal: Negative for nausea, vomiting, abdominal pain, diarrhea, constipation, blood in stool and abdominal distention.  Genitourinary: Negative for urgency, hematuria, flank pain, discharge, difficulty urinating and genital sores.  Musculoskeletal: Negative for myalgias, back pain, joint swelling, arthralgias and gait problem.  Skin: Negative for rash.  Neurological: Negative for dizziness, syncope, speech difficulty, weakness, numbness and headaches.  Hematological: Negative for adenopathy. Does not bruise/bleed easily.  Psychiatric/Behavioral: Negative for behavioral problems and dysphoric mood. The patient is not nervous/anxious.  Objective:   Physical Exam  Constitutional: He is oriented to person, place, and time. He appears well-developed.  HENT:  Head: Normocephalic.  Right Ear: External ear normal.  Left Ear: External ear normal.  Eyes: Conjunctivae and EOM are normal.  Neck: Normal range of motion.  Cardiovascular: Normal rate and normal heart sounds.   Pulmonary/Chest: Breath sounds normal.  Abdominal: Bowel sounds are normal.  Musculoskeletal: Normal range of motion. He exhibits no edema and no tenderness.  Neurological: He is alert and oriented to person, place, and time.  Psychiatric: He has a normal mood and affect. His  behavior is normal.          Assessment & Plan:   Cough. Probable mild viral syndrome. The patient will be treated symptomatically. He'll be given a trial  of Claritin and if this is not effective will try Mucinex DM

## 2012-10-18 NOTE — Patient Instructions (Signed)
Take over-the-counter expectorants and cough medications such as  Mucinex DM.  Call if there is no improvement in 5 to 7 days or if he developed worsening cough, fever, or new symptoms, such as shortness of breath or chest pain. 

## 2012-11-11 ENCOUNTER — Ambulatory Visit (INDEPENDENT_AMBULATORY_CARE_PROVIDER_SITE_OTHER): Payer: Medicare Other | Admitting: Family Medicine

## 2012-11-11 ENCOUNTER — Encounter: Payer: Self-pay | Admitting: Family Medicine

## 2012-11-11 VITALS — BP 130/68 | Temp 98.3°F | Wt 235.0 lb

## 2012-11-11 DIAGNOSIS — M79609 Pain in unspecified limb: Secondary | ICD-10-CM

## 2012-11-11 DIAGNOSIS — M2040 Other hammer toe(s) (acquired), unspecified foot: Secondary | ICD-10-CM

## 2012-11-11 DIAGNOSIS — M21961 Unspecified acquired deformity of right lower leg: Secondary | ICD-10-CM

## 2012-11-11 DIAGNOSIS — M2041 Other hammer toe(s) (acquired), right foot: Secondary | ICD-10-CM

## 2012-11-11 DIAGNOSIS — M79671 Pain in right foot: Secondary | ICD-10-CM

## 2012-11-11 DIAGNOSIS — M21969 Unspecified acquired deformity of unspecified lower leg: Secondary | ICD-10-CM

## 2012-11-11 DIAGNOSIS — S98119A Complete traumatic amputation of unspecified great toe, initial encounter: Secondary | ICD-10-CM

## 2012-11-11 DIAGNOSIS — IMO0002 Reserved for concepts with insufficient information to code with codable children: Secondary | ICD-10-CM

## 2012-11-11 MED ORDER — TRAMADOL HCL 50 MG PO TABS: 50.0000 mg | ORAL_TABLET | Freq: Three times a day (TID) | ORAL | Status: DC | PRN

## 2012-11-11 NOTE — Patient Instructions (Addendum)
-  wear wide toe well fitting shoes  -elevate feet for 30 minutes twice daily above waist  -get xrays - we will contact you once read by radiology  --We placed a referral for you as discussed to podiatry. It usually takes about 1-2 weeks to process and schedule this referral. If you have not heard from [place] regarding this appointment in 2 weeks please contact our office.  -follow up with your doctor in 1-2 weeks or sooner if worsening or other concerns

## 2012-11-11 NOTE — Progress Notes (Signed)
Chief Complaint  Patient presents with  . right foot swelling    HPI:  67 yo pt of Dr. Amador Cunas here for acute visit for R foot pain: -sudden onset R forefront pain a few days ago in the middle of a walk -had lawn mower accident remotely and had surgery on this foot - has some swelling in this foot from time to time but more then usual the last few days -has been out in the heat and on feet quite a bit -no diet changes he can think of -pain in ant foot persist -denies: fevers, chills, redness, heat, leg swelling, injury that he can think of, hx blood clots, recent immobilization of surgery, SOB, DOE -walks 2-3 miles several times per week -ibuprofen helps but doesn't want to take this frequently -new shoes a few months ago   ROS: See pertinent positives and negatives per HPI.  Past Medical History  Diagnosis Date  . GERD 07/01/2007  . HYPERLIPIDEMIA 06/29/2007  . 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 PROSTATE SPECIFIC ANTIGEN, ELEVATED 06/29/2007  . SEIZURE DISORDER 07/01/2007  . ED (erectile dysfunction)     No family history on file.  History   Social History  . Marital Status: Single    Spouse Name: N/A    Number of Children: N/A  . Years of Education: N/A   Social History Main Topics  . Smoking status: Never Smoker   . Smokeless tobacco: Never Used  . Alcohol Use: 2.0 oz/week    4 drink(s) per week  . Drug Use: No  . Sexually Active: None   Other Topics Concern  . None   Social History Narrative  . None    Current outpatient prescriptions:aspirin 81 MG EC tablet, Take 81 mg by mouth daily.  , Disp: , Rfl: ;  Multiple Vitamins-Minerals (CENTRUM SILVER PO), Take by mouth daily.  , Disp: , Rfl: ;  Omega-3 Fatty Acids (FISH OIL) 1000 MG CAPS, Take by mouth daily.  , Disp: , Rfl: ;  omeprazole (PRILOSEC) 20 MG capsule, Take 20 mg by mouth daily.  , Disp: , Rfl:  tadalafil (CIALIS) 20 MG tablet, Take 1 tablet (20 mg total) by mouth daily as needed., Disp: 12  tablet, Rfl: 12;  vardenafil (LEVITRA) 20 MG tablet, Take 1 tablet (20 mg total) by mouth daily as needed for erectile dysfunction., Disp: 6 tablet, Rfl: 6;  traMADol (ULTRAM) 50 MG tablet, Take 1 tablet (50 mg total) by mouth every 8 (eight) hours as needed for pain., Disp: 15 tablet, Rfl: 0  EXAM:  Filed Vitals:   11/11/12 1342  BP: 130/68  Temp: 98.3 F (36.8 C)    Body mass index is 32.79 kg/(m^2).  GENERAL: vitals reviewed and listed above, alert, oriented, appears well hydrated and in no acute distress  HEENT: atraumatic, conjunttiva clear, no obvious abnormalities on inspection of external nose and ears  NECK: no obvious masses on inspection  LUNGS: clear to auscultation bilaterally, no wheezes, rales or rhonchi, good air movement  CV: HRRR, no peripheral edema  MS: moves all extremities without noticeable abnormality -s/p amputation of R great toe, hammer toe defority of R 2-4th digits with varus deformity, no redness or warmth in feet or toes, some swelling of R forefoot an dminimal bilat swelling of ankles NOT > on R then left, minimal defuse TTP over R MTP joints 2-4, normal DP pulses and cap refill  PSYCH: pleasant and cooperative, no obvious depression or anxiety  ASSESSMENT AND PLAN:  Discussed the following assessment and plan:  Right foot pain - Plan: DG Foot Complete Right, Ambulatory referral to Podiatry, traMADol (ULTRAM) 50 MG tablet  Hammer toe of right foot - Plan: Ambulatory referral to Podiatry  Great toe amputation status, right - Plan: Ambulatory referral to Podiatry  Foot deformity, right - Plan: Ambulatory referral to Podiatry  -no obvious etiology on exam, suspect strain and discussed conservative tx. Given his concern and sudden onset will get plain films to exclude stress fx etc. Also discussed given degree of deformity and his active lifestyle seeing a podiatrist may be beneficial and he wanted this referral. Placed. Bilat mild edema - likely  mild venous insufficiency - advised elevation and follow up with PCP. -Patient advised to return or notify a doctor immediately if symptoms worsen or persist or new concerns arise.  Patient Instructions  -wear wide toe well fitting shoes  -elevate feet for 30 minutes twice daily above waist  -get xrays - we will contact you once read by radiology  --We placed a referral for you as discussed to podiatry. It usually takes about 1-2 weeks to process and schedule this referral. If you have not heard from [place] regarding this appointment in 2 weeks please contact our office.  -follow up with your doctor in 1-2 weeks or sooner if worsening or other concerns         Kenya Shiraishi R.

## 2012-11-12 ENCOUNTER — Ambulatory Visit (INDEPENDENT_AMBULATORY_CARE_PROVIDER_SITE_OTHER)
Admission: RE | Admit: 2012-11-12 | Discharge: 2012-11-12 | Disposition: A | Discharge status: Home / Self Care | Payer: Medicare Other | Source: Ambulatory Visit | Attending: Family Medicine | Admitting: Family Medicine

## 2012-11-12 DIAGNOSIS — M79671 Pain in right foot: Secondary | ICD-10-CM

## 2012-11-12 DIAGNOSIS — M79609 Pain in unspecified limb: Secondary | ICD-10-CM

## 2012-11-12 NOTE — Progress Notes (Signed)
Quick Note:  Called and spoke with pt and pt is aware. ______ 

## 2012-11-13 NOTE — Progress Notes (Signed)
Quick Note:  Called and spoke with pt and pt is aware. ______ 

## 2013-05-04 ENCOUNTER — Encounter: Payer: Self-pay | Admitting: Internal Medicine

## 2013-05-04 ENCOUNTER — Ambulatory Visit (INDEPENDENT_AMBULATORY_CARE_PROVIDER_SITE_OTHER): Payer: BC Managed Care – PPO | Admitting: Internal Medicine

## 2013-05-04 VITALS — BP 140/86 | HR 78 | Temp 98.3°F | Resp 20 | Wt 228.0 lb

## 2013-05-04 DIAGNOSIS — M2550 Pain in unspecified joint: Secondary | ICD-10-CM

## 2013-05-04 DIAGNOSIS — E785 Hyperlipidemia, unspecified: Secondary | ICD-10-CM

## 2013-05-04 NOTE — Patient Instructions (Addendum)
It is important that you exercise regularly, at least 20 minutes 3 to 4 times per week.  If you develop chest pain or shortness of breath seek  medical attention.  Call or return to clinic prn if these symptoms worsen or fail to improve as anticipated.   Return in 3 months for follow-up

## 2013-05-04 NOTE — Progress Notes (Signed)
Subjective:    Patient ID: Samuel Moran, male    DOB: [birth year], 67 y.o.   MRN: [id number]  HPI   67 year old patient who presents with a chief complaint of joint pain. This began about 6 weeks ago when he initially had left shoulder pain that was quite bothersome for a few days. He had a difficult time lifting his left arm. He later developed similar right shoulder pain. He has had the pain and also soft tissue swelling involving his third and fourth MCP joints on the left. He also describes pain and stiffness involving his right wrist. He continues to have a very active lifestyle. Presently no joint complaints. Denies any early morning stiffness or other constitutional complaints.  Past Medical History  Diagnosis Date  . GERD 07/01/2007  . HYPERLIPIDEMIA 06/29/2007  . 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 PROSTATE SPECIFIC ANTIGEN, ELEVATED 06/29/2007  . SEIZURE DISORDER 07/01/2007  . ED (erectile dysfunction)     History   Social History  . Marital Status: Single    Spouse Name: N/A    Number of Children: N/A  . Years of Education: N/A   Occupational History  . Not on file.   Social History Main Topics  . Smoking status: Never Smoker   . Smokeless tobacco: Never Used  . Alcohol Use: 2.0 oz/week    4 drink(s) per week  . Drug Use: No  . Sexual Activity: Not on file   Other Topics Concern  . Not on file   Social History Narrative  . No narrative on file    Past Surgical History  Procedure Laterality Date  . Amputation      right great toe - tramatic age 29    No family history on file.  No Known Allergies  Current Outpatient Prescriptions on File Prior to Visit  Medication Sig Dispense Refill  . aspirin 81 MG EC tablet Take 81 mg by mouth daily.        . Multiple Vitamins-Minerals (CENTRUM SILVER PO) Take by mouth daily.        . Omega-3 Fatty Acids (FISH OIL) 1000 MG CAPS Take by mouth daily.        Marland Kitchen omeprazole (PRILOSEC) 20 MG capsule Take 20 mg by mouth  daily.        . tadalafil (CIALIS) 20 MG tablet Take 1 tablet (20 mg total) by mouth daily as needed.  12 tablet  12  . vardenafil (LEVITRA) 20 MG tablet Take 1 tablet (20 mg total) by mouth daily as needed for erectile dysfunction.  6 tablet  6   No current facility-administered medications on file prior to visit.    BP 140/86  Pulse 78  Temp(Src) 98.3 F (36.8 C) (Oral)  Resp 20  Wt 228 lb (103.42 kg)  SpO2 98%       Review of Systems  Constitutional: Negative for fever, chills, appetite change and fatigue.  HENT: Negative for congestion, dental problem, ear pain, hearing loss, sore throat, tinnitus, trouble swallowing and voice change.   Eyes: Negative for pain, discharge and visual disturbance.  Respiratory: Negative for cough, chest tightness, wheezing and stridor.   Cardiovascular: Negative for chest pain, palpitations and leg swelling.  Gastrointestinal: Negative for nausea, vomiting, abdominal pain, diarrhea, constipation, blood in stool and abdominal distention.  Genitourinary: Negative for urgency, hematuria, flank pain, discharge, difficulty urinating and genital sores.  Musculoskeletal: Positive for arthralgias and joint swelling. Negative for back pain, gait problem, myalgias and neck stiffness.  Skin: Negative for rash.  Neurological: Negative for dizziness, syncope, speech difficulty, weakness, numbness and headaches.  Hematological: Negative for adenopathy. Does not bruise/bleed easily.  Psychiatric/Behavioral: Negative for behavioral problems and dysphoric mood. The patient is not nervous/anxious.        Objective:   Physical Exam  Constitutional: He is oriented to person, place, and time. He appears well-developed.  HENT:  Head: Normocephalic.  Right Ear: External ear normal.  Left Ear: External ear normal.  Eyes: Conjunctivae and EOM are normal.  Neck: Normal range of motion.  Cardiovascular: Normal rate and normal heart sounds.   Pulmonary/Chest:  Breath sounds normal.  Abdominal: Bowel sounds are normal.  Musculoskeletal: Normal range of motion. He exhibits no edema and no tenderness.  No signs of active synovitis No stiffness  Neurological: He is alert and oriented to person, place, and time.  Psychiatric: He has a normal mood and affect. His behavior is normal.          Assessment & Plan:   Arthralgias. Options discussed. Have elected to observe over the short-term and schedule a complete physical exam. If he develops worsening symptoms we'll perform a rheumatologic evaluation. Doubtful would be very revealing at this point

## 2013-05-04 NOTE — Progress Notes (Signed)
Pre-visit discussion using our clinic review tool. No additional management support is needed unless otherwise documented below in the visit note.  

## 2013-07-13 ENCOUNTER — Ambulatory Visit (INDEPENDENT_AMBULATORY_CARE_PROVIDER_SITE_OTHER): Payer: Medicare HMO | Admitting: Family

## 2013-07-13 ENCOUNTER — Encounter: Payer: Self-pay | Admitting: Family

## 2013-07-13 VITALS — BP 118/80 | Temp 97.8°F | Wt 233.0 lb

## 2013-07-13 DIAGNOSIS — M1711 Unilateral primary osteoarthritis, right knee: Secondary | ICD-10-CM

## 2013-07-13 DIAGNOSIS — M1710 Unilateral primary osteoarthritis, unspecified knee: Secondary | ICD-10-CM

## 2013-07-13 DIAGNOSIS — IMO0002 Reserved for concepts with insufficient information to code with codable children: Secondary | ICD-10-CM

## 2013-07-13 DIAGNOSIS — M7051 Other bursitis of knee, right knee: Secondary | ICD-10-CM

## 2013-07-13 DIAGNOSIS — M76899 Other specified enthesopathies of unspecified lower limb, excluding foot: Secondary | ICD-10-CM

## 2013-07-13 MED ORDER — MELOXICAM 15 MG PO TABS: 15.0000 mg | ORAL_TABLET | Freq: Every day | ORAL | Status: DC

## 2013-07-13 MED ORDER — METHYLPREDNISOLONE ACETATE 40 MG/ML IJ SUSP: 40.0000 mg | Freq: Once | INTRAMUSCULAR | Status: DC

## 2013-07-13 NOTE — Progress Notes (Signed)
Pre visit review using our clinic review tool, if applicable. No additional management support is needed unless otherwise documented below in the visit note. 

## 2013-07-13 NOTE — Progress Notes (Signed)
Subjective:    Patient ID: Samuel Moran, male    DOB: 1945-05-17, 68 y.o.   MRN: [id number]  Knee Pain    68 year old white male, nonsmoker is in today with complaints of swelling to the right knee present x1 week without any injury. Has a history of osteoarthritis. Then he tends to be better with movement and worse after sitting still for an extended period of time. Has mild pain about a 3/10. Occasionally takes ibuprofen.   Review of Systems  Constitutional: Negative.   Respiratory: Negative.   Cardiovascular: Negative.   Gastrointestinal: Negative.   Genitourinary: Negative.   Musculoskeletal: Positive for arthralgias.       Right knee swelling  Skin: Negative.   Neurological: Negative.    Past Medical History  Diagnosis Date  . GERD 07/01/2007  . HYPERLIPIDEMIA 06/29/2007  . 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 PROSTATE SPECIFIC ANTIGEN, ELEVATED 06/29/2007  . SEIZURE DISORDER 07/01/2007  . ED (erectile dysfunction)     History   Social History  . Marital Status: Single    Spouse Name: N/A    Number of Children: N/A  . Years of Education: N/A   Occupational History  . Not on file.   Social History Main Topics  . Smoking status: Never Smoker   . Smokeless tobacco: Never Used  . Alcohol Use: 2.0 oz/week    4 drink(s) per week  . Drug Use: No  . Sexual Activity: Not on file   Other Topics Concern  . Not on file   Social History Narrative  . No narrative on file    Past Surgical History  Procedure Laterality Date  . Amputation      right great toe - tramatic age 68    No family history on file.  No Known Allergies  Current Outpatient Prescriptions on File Prior to Visit  Medication Sig Dispense Refill  . aspirin 81 MG EC tablet Take 81 mg by mouth daily.        . Multiple Vitamins-Minerals (CENTRUM SILVER PO) Take by mouth daily.        . naproxen sodium (ALEVE) 220 MG tablet Take 220 mg by mouth 2 (two) times daily with a meal.      . Omega-3  Fatty Acids (FISH OIL) 1000 MG CAPS Take by mouth daily.        Marland Kitchen omeprazole (PRILOSEC) 20 MG capsule Take 20 mg by mouth daily.        . tadalafil (CIALIS) 20 MG tablet Take 1 tablet (20 mg total) by mouth daily as needed.  12 tablet  12  . vardenafil (LEVITRA) 20 MG tablet Take 1 tablet (20 mg total) by mouth daily as needed for erectile dysfunction.  6 tablet  6   No current facility-administered medications on file prior to visit.    BP 118/80  Temp(Src) 97.8 F (36.6 C) (Oral)  Wt 233 lb (105.688 kg)chart    Objective:   Physical Exam  Constitutional: He is oriented to person, place, and time. He appears well-developed and well-nourished.  Neck: Normal range of motion. Neck supple.  Cardiovascular: Normal rate, regular rhythm and normal heart sounds.   Pulmonary/Chest: Effort normal and breath sounds normal.  Musculoskeletal: He exhibits edema. He exhibits no tenderness.  Moderate swelling said the prepatellar area of the right knee. Mild crepitus. No erythema.  Neurological: He is alert and oriented to person, place, and time.  Skin: Skin is warm and dry.  Psychiatric:  He has a normal mood and affect.      Right knee: Informed consent obtained.  Under sterile technique  the patient's knee was prepped with betadine. Local anesthesia was obtained with topical spray. Then 40 mg of Depo-Medrol and 1/2 cc of lidocaine was injected into the joint space. The patient tolerated the procedure without complications. Post injection care discussed with patient.     Assessment & Plan:  Samuel Moran was seen today for knee pain.  Diagnoses and associated orders for this visit:  Bursitis of right knee  Other Orders - meloxicam (MOBIC) 15 MG tablet; Take 1 tablet (15 mg total) by mouth daily.    Consider referral to orthopedics if symptoms persist. Ice applied to the affected area.

## 2013-07-13 NOTE — Patient Instructions (Signed)
Prepatellar Bursitis °with Rehab  °Bursitis is a condition that is characterized by inflammation of a bursa. Bursa exists in many areas of the body. They are fluid filled sacs that lie between a soft tissue (skin, tendon, or ligament) and a bone, and they reduce friction between the structures as well as the stress placed on the soft tissue. Prepatellar bursitis is inflammation of the bursa that lies between the skin and the kneecap (patella). This condition often causes pain over the patella. °SYMPTOMS  °· Pain, tenderness, and/or inflammation over the patella. °· Pain that worsens with movement of the knee joint. °· Decreased range of motion for the knee joint. °· A crackling sound (crepitation) when the bursa is moved or touched. °· Occasionally, painless swelling of the bursa. °· Fever (when infected). °CAUSES  °Bursitis is caused by damage to the bursa, which results in an inflammatory response. Common mechanisms of injury include: °· Direct trauma to the front of the knee. °· Repetitive and/ or stressful use of the knee. °RISK INCREASES WITH: °· Activities in which kneeling and/or falling on one's knees is likely (volleyball or football). °· Repetitive and stressful training, especially if it involves running on hills. °· Improper training techniques, such as a sudden increase in the intensity, frequency or duration of training. °· Failure to warm-up properly before activity. °· Poor technique. °· Artificial turf. °PREVENTION  °· Avoid kneeling or falling on your knees. °· Warm up and stretch properly before activity. °· Allow for adequate recovery between workouts. °· Maintain physical fitness: °· Strength, flexibility, and endurance. °· Cardiovascular fitness. °· Learn and use proper technique. When possible, a have coach correct improper technique. °· Wear properly fitted and padded protective equipment (knee pads). °PROGNOSIS  °If treated properly, then the symptoms of prepatellar bursitis usually resolve  within 2 weeks. °RELATED COMPLICATIONS  °· Recurrent symptoms that result in a chronic problem. °· Prolonged healing time, if improperly treated or re-injured. °· Limited range of motion. °· Infection of bursa. °· Chronic inflammation or scarring of bursa. °TREATMENT  °Treatment initially involves the use of ice and medication to help reduce pain and inflammation. The use of strengthening and stretching exercises may help reduce pain with activity, especially those of the quadriceps and hamstring muscles. These exercises may be performed at home or with referral to a therapist. Your caregiver may recommend kneepads when you return to playing sports, in order to reduce the stress on the prepatellar bursa. If symptoms persist despite treatment, then your caregiver may drain fluid out with a needle (aspirate) the bursa. If symptoms persist for greater than 6 months despite non-surgical (conservative) treatment, then surgery may be recommended to remove the bursa.  °MEDICATION °· If pain medication is necessary, then nonsteroidal anti-inflammatory medications, such as aspirin and ibuprofen, or other minor pain relievers, such as acetaminophen, are often recommended. °· Do not take pain medication for 7 days before surgery. °· Prescription pain relievers may be given if deemed necessary by your caregiver. Use only as directed and only as much as you need. °· Corticosteroid injections may be given by your caregiver. These injections should be reserved for the most serious cases, because they may only be given a certain number of times. °HEAT AND COLD °· Cold treatment (icing) relieves pain and reduces inflammation. Cold treatment should be applied for 10 to 15 minutes every 2 to 3 hours for inflammation and pain and immediately after any activity that aggravates your symptoms. Use ice packs or massage the   area with a piece of ice (ice massage). °· Heat treatment may be used prior to performing the stretching and  strengthening activities prescribed by your caregiver, physical therapist, or athletic trainer. Use a heat pack or soak the injury in warm water. °SEEK MEDICAL CARE IF: °· Treatment seems to offer no benefit, or the condition worsens. °· Any medications produce adverse side effects. °EXERCISES °RANGE OF MOTION (ROM) AND STRETCHING EXERCISES - Prepatellar Bursitis °These exercises may help you when beginning to rehabilitate your injury. Your symptoms may resolve with or without further involvement from your physician, physical therapist or athletic trainer. While completing these exercises, remember:  °· Restoring tissue flexibility helps normal motion to return to the joints. This allows healthier, less painful movement and activity. °· An effective stretch should be held for at least 30 seconds. °· A stretch should never be painful. You should only feel a gentle lengthening or release in the stretched tissue. °STRETCH - Hamstrings, Standing °· Stand or sit and extend your right / left leg, placing your foot on a chair or foot stool °· Keeping a slight arch in your low back and your hips straight forward. °· Lead with your chest and lean forward at the waist until you feel a gentle stretch in the back of your right / left knee or thigh. (When done correctly, this exercise requires leaning only a small distance.) °· Hold this position for __________ seconds. °Repeat __________ times. Complete this stretch __________ times per day. °STRETCH - Quadriceps, Prone  °· Lie on your stomach on a firm surface, such as a bed or padded floor. °· Bend your right / left knee and grasp your ankle. If you are unable to reach, your ankle or pant leg, use a belt around your foot to lengthen your reach. °· Gently pull your heel toward your buttocks. Your knee should not slide out to the side. You should feel a stretch in the front of your thigh and/or knee. °· Hold this position for __________ seconds. °Repeat __________ times.  Complete this stretch __________ times per day.  °STRETCH - Hamstrings/Adductors, V-Sit  °· Sit on the floor with your legs extended in a large "V," keeping your knees straight. °· With your head and chest upright, bend at your waist reaching for your right foot to stretch your left adductors. °· You should feel a stretch in your left inner thigh. Hold for __________ seconds. °· Return to the upright position to relax your leg muscles. °· Continuing to keep your chest upright, bend straight forward at your waist to stretch your hamstrings. °· You should feel a stretch behind both of your thighs and/or knees. Hold for __________ seconds. °· Return to the upright position to relax your leg muscles. °· Repeat steps 2 through 4. °Repeat __________ times. Complete this exercise __________ times per day.  °STRENGTHENING EXERCISES - Prepatellar Bursitis °· These exercises may help you when beginning to rehabilitate your injury. They may resolve your symptoms with or without further involvement from your physician, physical therapist or athletic trainer. While completing these exercises, remember: °· Muscles can gain both the endurance and the strength needed for everyday activities through controlled exercises. °· Complete these exercises as instructed by your physician, physical therapist or athletic trainer. Progress the resistance and repetitions only as guided. °STRENGTH - Quadriceps, Isometrics °· Lie on your back with your right / left leg extended and your opposite knee bent. °· Gradually tense the muscles in the front of your right /   left thigh. You should see either your kneecap slide up toward your hip or increased dimpling just above the knee. This motion will push the back of the knee down toward the floor/mat/bed on which you are lying. °· Hold the muscle as tight as you can without increasing your pain for __________ seconds. °· Relax the muscles slowly and completely in between each repetition. °Repeat  __________ times. Complete this exercise __________ times per day.  °STRENGTH - Quadriceps, Short Arcs  °· Lie on your back. Place a __________ inch towel roll under your knee so that the knee slightly bends. °· Raise only your lower leg by tightening the muscles in the front of your thigh. Do not allow your thigh to rise. °· Hold this position for __________ seconds. °Repeat __________ times. Complete this exercise __________ times per day.  °OPTIONAL ANKLE WEIGHTS: Begin with ____________________, but DO NOT exceed ____________________. Increase in1 lb/0.5 kg increments.  °STRENGTH - Quadriceps, Straight Leg Raises  °Quality counts! Watch for signs that the quadriceps muscle is working to insure you are strengthening the correct muscles and not "cheating" by substituting with healthier muscles. °· Lay on your back with your right / left leg extended and your opposite knee bent. °· Tense the muscles in the front of your right / left thigh. You should see either your kneecap slide up or increased dimpling just above the knee. Your thigh may even quiver. °· Tighten these muscles even more and raise your leg 4 to 6 inches off the floor. Hold for __________ seconds. °· Keeping these muscles tense, lower your leg. °· Relax the muscles slowly and completely in between each repetition. °Repeat __________ times. Complete this exercise __________ times per day.  °STRENGTH - Quadriceps, Step-Ups  °· Use a thick book, step or step stool that is __________ inches tall. °· Holding a wall or counter for balance only, not support. °· Slowly step-up with your right / left foot, keeping your knee in line with your hip and foot. Do not allow your knee to bend so far that you cannot see your toes. °· Slowly unlock your knee and lower yourself to the starting position. Your muscles, not gravity, should lower you. °Repeat __________ times. Complete this exercise __________ times per day. °Document Released: 04/21/2005 Document Revised:  07/14/2011 Document Reviewed: 08/03/2008 °[employer]® Patient Information ©2014 [employer]. ° °

## 2013-07-18 ENCOUNTER — Ambulatory Visit: Payer: Medicare Other | Admitting: Family

## 2013-08-04 ENCOUNTER — Ambulatory Visit (INDEPENDENT_AMBULATORY_CARE_PROVIDER_SITE_OTHER): Payer: Medicare HMO | Admitting: Internal Medicine

## 2013-08-04 ENCOUNTER — Encounter: Payer: Self-pay | Admitting: Internal Medicine

## 2013-08-04 VITALS — BP 136/70 | HR 57 | Temp 98.4°F | Resp 20 | Ht 70.25 in | Wt 229.0 lb

## 2013-08-04 DIAGNOSIS — Z0000 Encounter for general adult medical examination without abnormal findings: Secondary | ICD-10-CM

## 2013-08-04 DIAGNOSIS — N529 Male erectile dysfunction, unspecified: Secondary | ICD-10-CM

## 2013-08-04 DIAGNOSIS — N400 Enlarged prostate without lower urinary tract symptoms: Secondary | ICD-10-CM

## 2013-08-04 DIAGNOSIS — E785 Hyperlipidemia, unspecified: Secondary | ICD-10-CM

## 2013-08-04 DIAGNOSIS — K219 Gastro-esophageal reflux disease without esophagitis: Secondary | ICD-10-CM

## 2013-08-04 DIAGNOSIS — Z23 Encounter for immunization: Secondary | ICD-10-CM

## 2013-08-04 DIAGNOSIS — R569 Unspecified convulsions: Secondary | ICD-10-CM

## 2013-08-04 LAB — CBC WITH DIFFERENTIAL/PLATELET
BASOS PCT: 0.4 % (ref 0.0–3.0)
Basophils Absolute: 0 10*3/uL (ref 0.0–0.1)
EOS ABS: 0.3 10*3/uL (ref 0.0–0.7)
EOS PCT: 4.2 % (ref 0.0–5.0)
HEMATOCRIT: 35.7 % — AB (ref 39.0–52.0)
Hemoglobin: 11.8 g/dL — ABNORMAL LOW (ref 13.0–17.0)
LYMPHS ABS: 1.8 10*3/uL (ref 0.7–4.0)
Lymphocytes Relative: 21.2 % (ref 12.0–46.0)
MCHC: 33 g/dL (ref 30.0–36.0)
MCV: 81.9 fl (ref 78.0–100.0)
MONO ABS: 0.6 10*3/uL (ref 0.1–1.0)
Monocytes Relative: 7.4 % (ref 3.0–12.0)
NEUTROS PCT: 66.8 % (ref 43.0–77.0)
Neutro Abs: 5.5 10*3/uL (ref 1.4–7.7)
PLATELETS: 250 10*3/uL (ref 150.0–400.0)
RBC: 4.36 Mil/uL (ref 4.22–5.81)
RDW: 14.7 % — ABNORMAL HIGH (ref 11.5–14.6)
WBC: 8.3 10*3/uL (ref 4.5–10.5)

## 2013-08-04 LAB — COMPREHENSIVE METABOLIC PANEL
ALBUMIN: 3.7 g/dL (ref 3.5–5.2)
ALK PHOS: 48 U/L (ref 39–117)
ALT: 15 U/L (ref 0–53)
AST: 16 U/L (ref 0–37)
BILIRUBIN TOTAL: 0.9 mg/dL (ref 0.3–1.2)
BUN: 23 mg/dL (ref 6–23)
CO2: 24 mEq/L (ref 19–32)
Calcium: 8.6 mg/dL (ref 8.4–10.5)
Chloride: 102 mEq/L (ref 96–112)
Creatinine, Ser: 1 mg/dL (ref 0.4–1.5)
GFR: 79.46 mL/min (ref >60.00)
Glucose, Bld: 92 mg/dL (ref 70–99)
POTASSIUM: 4.2 meq/L (ref 3.5–5.1)
SODIUM: 134 meq/L — AB (ref 135–145)
TOTAL PROTEIN: 7 g/dL (ref 6.0–8.3)

## 2013-08-04 LAB — LIPID PANEL
CHOL/HDL RATIO: 4
Cholesterol: 170 mg/dL (ref 0–200)
HDL: 47.9 mg/dL (ref >39.00)
LDL CALC: 114 mg/dL — AB (ref 0–99)
Triglycerides: 40 mg/dL (ref 0.0–149.0)
VLDL: 8 mg/dL (ref 0.0–40.0)

## 2013-08-04 LAB — TSH: TSH: 4.26 u[IU]/mL (ref 0.35–5.50)

## 2013-08-04 LAB — PSA: PSA: 0.56 ng/mL (ref 0.10–4.00)

## 2013-08-04 NOTE — Progress Notes (Signed)
Pre-visit discussion using our clinic review tool. No additional management support is needed unless otherwise documented below in the visit note.  

## 2013-08-04 NOTE — Patient Instructions (Signed)
It is important that you exercise regularly, at least 20 minutes 3 to 4 times per week.  If you develop chest pain or shortness of breath seek  medical attention.  Limit your sodium (Salt) intake  Return in one year for follow-up  

## 2013-08-04 NOTE — Progress Notes (Signed)
Subjective:    Patient ID: Samuel Moran, male    DOB: [birth year], 68 y.o.   MRN: [id number]  HPI   68 year old patient who is seen today for a wellness exam.  He has a remote history of a seizure in 2009 which has not reoccurred.  No new concerns or complaints.   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  HENT: Negative for congestion, dental problem, ear pain, hearing loss, mouth sores, rhinorrhea, sinus pressure, sneezing, tinnitus, trouble swallowing and voice change.   Eyes: Negative for photophobia, pain, redness and visual disturbance.  Respiratory: Negative for apnea, cough, choking, chest tightness, shortness of breath and wheezing.   Cardiovascular: Negative for chest pain, palpitations and leg swelling.  Gastrointestinal: Negative for nausea, vomiting, abdominal pain, diarrhea, constipation, blood in stool, abdominal distention, anal bleeding and rectal pain.  Genitourinary: Negative for dysuria, urgency, frequency, hematuria, flank pain, decreased urine volume, discharge, penile swelling, scrotal swelling, difficulty urinating, genital sores and testicular pain.  Musculoskeletal: Negative for arthralgias, back pain, gait problem, joint swelling, myalgias, neck pain and neck stiffness.  Skin: Negative for color  change, rash and wound.  Neurological: Negative for dizziness, tremors, seizures, syncope, facial asymmetry, speech difficulty, weakness, light-headedness, numbness and headaches.  Hematological: Negative for adenopathy. Does not bruise/bleed easily.  Psychiatric/Behavioral: Negative for suicidal ideas, hallucinations, behavioral problems, confusion, sleep disturbance, self-injury, dysphoric mood, decreased concentration and agitation. The patient is not nervous/anxious.        Objective:   Physical Exam  Constitutional: He appears well-developed and well-nourished.  HENT:  Head: Normocephalic and atraumatic.  Right Ear: External ear normal.  Left Ear: External ear normal.  Nose: Nose normal.  Mouth/Throat: Oropharynx is clear and moist.  Eyes: Conjunctivae and EOM are normal. Pupils are equal, round, and reactive to light. No scleral icterus.  Neck: Normal range of motion. Neck supple. No JVD present. No thyromegaly present.  Cardiovascular: Regular rhythm, normal heart sounds and intact distal pulses.  Exam reveals no gallop and no friction rub.   No murmur heard. Pulmonary/Chest: Effort normal and breath sounds normal. He exhibits no tenderness.  Abdominal: Soft. Bowel sounds are normal. He exhibits no distension and no mass. There is no tenderness.  Genitourinary: Prostate normal and penis normal.  Musculoskeletal: Normal range of motion. He exhibits no edema and no tenderness.  Amputated great toe  Lymphadenopathy:    He has no cervical adenopathy.  Neurological: He is alert. He has normal reflexes. No cranial nerve deficit. Coordination normal.  Skin: Skin is warm and dry. No rash noted.  Psychiatric: He has a normal mood and affect. His behavior is normal.          Assessment & Plan:  Unremarkable preventative health examination. We'll continue low-salt heart healthy diet and regular exercise program. He walks  and exercises several times per week. He does have a history of  mild reflux that is presently controlled off medication. Laboratory studies were reviewed and these were unremarkable.  He did have a colonoscopy in 2006. We'll reassess in one year

## 2013-08-18 ENCOUNTER — Encounter: Payer: Self-pay | Admitting: Internal Medicine

## 2013-08-18 ENCOUNTER — Ambulatory Visit (INDEPENDENT_AMBULATORY_CARE_PROVIDER_SITE_OTHER): Payer: Medicare HMO | Admitting: Internal Medicine

## 2013-08-18 VITALS — BP 140/80 | HR 64 | Temp 97.7°F | Resp 20 | Ht 70.25 in | Wt 227.0 lb

## 2013-08-18 DIAGNOSIS — M25561 Pain in right knee: Secondary | ICD-10-CM

## 2013-08-18 DIAGNOSIS — M25569 Pain in unspecified knee: Secondary | ICD-10-CM

## 2013-08-18 NOTE — Patient Instructions (Signed)
Orthopedic followup as discussed

## 2013-08-18 NOTE — Progress Notes (Signed)
Subjective:    Patient ID: Samuel Moran, male    DOB: January 24, 1946, 68 y.o.   MRN: [id number]  HPI  68 year old patient who is seen today with persistent right knee pain.  He was seen one month ago with a right knee effusion.  He responded well temporarily with a cortisone injection.  He was seen recently for his annual exam and was placed on Loxitane, with the some benefit.  He denies that pain that interferes with daily activities.  Pain is aggravated by getting in and out of a vehicle.  Past Medical History  Diagnosis Date  . GERD 07/01/2007  . HYPERLIPIDEMIA 06/29/2007  . 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 PROSTATE SPECIFIC ANTIGEN, ELEVATED 06/29/2007  . SEIZURE DISORDER 07/01/2007  . ED (erectile dysfunction)     History   Social History  . Marital Status: Single    Spouse Name: N/A    Number of Children: N/A  . Years of Education: N/A   Occupational History  . Not on file.   Social History Main Topics  . Smoking status: Never Smoker   . Smokeless tobacco: Never Used  . Alcohol Use: 2.0 oz/week    4 drink(s) per week  . Drug Use: No  . Sexual Activity: Not on file   Other Topics Concern  . Not on file   Social History Narrative  . No narrative on file    Past Surgical History  Procedure Laterality Date  . Amputation      right great toe - tramatic age 75    No family history on file.  No Known Allergies  Current Outpatient Prescriptions on File Prior to Visit  Medication Sig Dispense Refill  . aspirin 81 MG EC tablet Take 81 mg by mouth daily.        . Multiple Vitamins-Minerals (CENTRUM SILVER PO) Take by mouth daily.        . naproxen sodium (ALEVE) 220 MG tablet Take 220 mg by mouth 2 (two) times daily with a meal.      . Omega-3 Fatty Acids (FISH OIL) 1000 MG CAPS Take by mouth daily.        Marland Kitchen omeprazole (PRILOSEC) 20 MG capsule Take 20 mg by mouth daily.        . tadalafil (CIALIS) 20 MG tablet Take 1 tablet (20 mg total) by mouth daily as  needed.  12 tablet  12  . vardenafil (LEVITRA) 20 MG tablet Take 1 tablet (20 mg total) by mouth daily as needed for erectile dysfunction.  6 tablet  6  . meloxicam (MOBIC) 15 MG tablet Take 1 tablet (15 mg total) by mouth daily.  30 tablet  3   No current facility-administered medications on file prior to visit.    BP 140/80  Pulse 64  Temp(Src) 97.7 F (36.5 C) (Oral)  Resp 20  Ht 5' 10.25" (1.784 m)  Wt 227 lb (102.967 kg)  BMI 32.35 kg/m2  SpO2 99%       Review of Systems  Constitutional: Negative for fever, chills, appetite change and fatigue.  HENT: Negative for congestion, dental problem, ear pain, hearing loss, sore throat, tinnitus, trouble swallowing and voice change.   Eyes: Negative for pain, discharge and visual disturbance.  Respiratory: Negative for cough, chest tightness, wheezing and stridor.   Cardiovascular: Negative for chest pain, palpitations and leg swelling.  Gastrointestinal: Negative for nausea, vomiting, abdominal pain, diarrhea, constipation, blood in stool and abdominal distention.  Genitourinary: Negative for urgency,  hematuria, flank pain, discharge, difficulty urinating and genital sores.  Musculoskeletal: Positive for arthralgias, gait problem and joint swelling. Negative for back pain, myalgias and neck stiffness.  Skin: Negative for rash.  Neurological: Negative for dizziness, syncope, speech difficulty, weakness, numbness and headaches.  Hematological: Negative for adenopathy. Does not bruise/bleed easily.  Psychiatric/Behavioral: Negative for behavioral problems and dysphoric mood. The patient is not nervous/anxious.        Objective:   Physical Exam  Constitutional: He appears well-developed and well-nourished. No distress.  Musculoskeletal:  Suggestion of a mild effusion of The right knee, but not definite.  No signs of active inflammation          Assessment & Plan:   Right knee pain.  Probable meniscal tear in view of his  recent effusion.  Will refer to orthopedics for further evaluation and management

## 2013-08-18 NOTE — Progress Notes (Signed)
Pre-visit discussion using our clinic review tool. No additional management support is needed unless otherwise documented below in the visit note.  

## 2013-12-30 ENCOUNTER — Encounter: Payer: Self-pay | Admitting: Internal Medicine

## 2013-12-30 ENCOUNTER — Ambulatory Visit (INDEPENDENT_AMBULATORY_CARE_PROVIDER_SITE_OTHER): Payer: Medicare HMO | Admitting: Internal Medicine

## 2013-12-30 VITALS — BP 153/87 | HR 66 | Temp 98.0°F | Resp 20 | Ht 70.25 in | Wt 230.0 lb

## 2013-12-30 DIAGNOSIS — R600 Localized edema: Secondary | ICD-10-CM

## 2013-12-30 DIAGNOSIS — R609 Edema, unspecified: Secondary | ICD-10-CM

## 2013-12-30 MED ORDER — FUROSEMIDE 40 MG PO TABS: 40.0000 mg | ORAL_TABLET | Freq: Every day | ORAL | Status: DC

## 2013-12-30 NOTE — Progress Notes (Signed)
Pre visit review using our clinic review tool, if applicable. No additional management support is needed unless otherwise documented below in the visit note. 

## 2013-12-30 NOTE — Progress Notes (Signed)
Subjective:    Patient ID: Samuel Moran, male    DOB: 1945-12-13, 68 y.o.   MRN: [id number]  HPI  68 year old patient who was seen by orthopedics recently.  Due to right knee pain.  He was placed in a knee brace temporarily with improvement of the pain but this caused the edema of the right lower leg in a brace was discontinued.  He was also treated with a 12 day prednisone dose pack, which completed about one week ago.  He has noted some significant lower extremity edema bilaterally.  He describes some stiffness in the knees and hips that he feels is related to the edema.  Past Medical History  Diagnosis Date  . GERD 07/01/2007  . HYPERLIPIDEMIA 06/29/2007  . 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 PROSTATE SPECIFIC ANTIGEN, ELEVATED 06/29/2007  . SEIZURE DISORDER 07/01/2007  . ED (erectile dysfunction)     History   Social History  . Marital Status: Single    Spouse Name: N/A    Number of Children: N/A  . Years of Education: N/A   Occupational History  . Not on file.   Social History Main Topics  . Smoking status: Never Smoker   . Smokeless tobacco: Never Used  . Alcohol Use: 2.0 oz/week    4 drink(s) per week  . Drug Use: No  . Sexual Activity: Not on file   Other Topics Concern  . Not on file   Social History Narrative  . No narrative on file    Past Surgical History  Procedure Laterality Date  . Amputation      right great toe - tramatic age 80    No family history on file.  No Known Allergies  Current Outpatient Prescriptions on File Prior to Visit  Medication Sig Dispense Refill  . aspirin 81 MG EC tablet Take 81 mg by mouth daily.        . meloxicam (MOBIC) 15 MG tablet Take 1 tablet (15 mg total) by mouth daily.  30 tablet  3  . Multiple Vitamins-Minerals (CENTRUM SILVER PO) Take by mouth daily.        . naproxen sodium (ALEVE) 220 MG tablet Take 220 mg by mouth 2 (two) times daily with a meal.      . Omega-3 Fatty Acids (FISH OIL) 1000 MG CAPS Take  by mouth daily.        Marland Kitchen omeprazole (PRILOSEC) 20 MG capsule Take 20 mg by mouth daily.        . tadalafil (CIALIS) 20 MG tablet Take 1 tablet (20 mg total) by mouth daily as needed.  12 tablet  12  . vardenafil (LEVITRA) 20 MG tablet Take 1 tablet (20 mg total) by mouth daily as needed for erectile dysfunction.  6 tablet  6   No current facility-administered medications on file prior to visit.    BP 153/87  Pulse 66  Temp(Src) 98 F (36.7 C) (Oral)  Resp 20  Ht 5' 10.25" (1.784 m)  Wt 230 lb (104.327 kg)  BMI 32.78 kg/m2     Review of Systems  Constitutional: Negative for fever, chills, appetite change and fatigue.  HENT: Negative for congestion, dental problem, ear pain, hearing loss, sore throat, tinnitus, trouble swallowing and voice change.   Eyes: Negative for pain, discharge and visual disturbance.  Respiratory: Negative for cough, chest tightness, wheezing and stridor.   Cardiovascular: Positive for leg swelling. Negative for chest pain and palpitations.  Gastrointestinal: Negative for nausea, vomiting, abdominal  pain, diarrhea, constipation, blood in stool and abdominal distention.  Genitourinary: Negative for urgency, hematuria, flank pain, discharge, difficulty urinating and genital sores.  Musculoskeletal: Negative for arthralgias, back pain, gait problem, joint swelling, myalgias and neck stiffness.  Skin: Negative for rash.  Neurological: Negative for dizziness, syncope, speech difficulty, weakness, numbness and headaches.  Hematological: Negative for adenopathy. Does not bruise/bleed easily.  Psychiatric/Behavioral: Negative for behavioral problems and dysphoric mood. The patient is not nervous/anxious.        Objective:   Physical Exam  Constitutional: He appears well-developed and well-nourished. No distress.  Cardiovascular: Regular rhythm.   Pulmonary/Chest: Effort normal and breath sounds normal. No respiratory distress. He has no wheezes. He has no rales.    Musculoskeletal: He exhibits edema.  Prominent lower extremity edema.  Distal to the knees Considerable edema of his partially amputated right great toe          Assessment & Plan:   Lower extremity edema.  We'll place on a low salt diet and short-term use of furosemide. Right knee pain is, better, we'll attempt to avoid use of any anti-inflammatory medications

## 2013-12-30 NOTE — Patient Instructions (Addendum)
Low-Sodium Eating Plan Sodium raises blood pressure and causes water to be held in the body. Getting less sodium from food will help lower your blood pressure, reduce any swelling, and protect your heart, liver, and kidneys. We get sodium by adding salt (sodium chloride) to food. Most of our sodium comes from canned, boxed, and frozen foods. Restaurant foods, fast foods, and pizza are also very high in sodium. Even if you take medicine to lower your blood pressure or to reduce fluid in your body, getting less sodium from your food is important. WHAT IS MY PLAN? Most people should limit their sodium intake to 2,300 mg a day. Your health care provider recommends that you limit your sodium intake to __________ a day.  WHAT DO I NEED TO KNOW ABOUT THIS EATING PLAN? For the low-sodium eating plan, you will follow these general guidelines:  Choose foods with a % Daily Value for sodium of less than 5% (as listed on the food label).   Use salt-free seasonings or herbs instead of table salt or sea salt.   Check with your health care provider or pharmacist before using salt substitutes.   Eat fresh foods.  Eat more vegetables and fruits.  Limit canned vegetables. If you do use them, rinse them well to decrease the sodium.   Limit cheese to 1 oz (28 g) per day.   Eat lower-sodium products, often labeled as "lower sodium" or "no salt added."  Avoid foods that contain monosodium glutamate (MSG). MSG is sometimes added to [place] food and some canned foods.  Check food labels (Nutrition Facts labels) on foods to learn how much sodium is in one serving.  Eat more home-cooked food and less restaurant, buffet, and fast food.  When eating at a restaurant, ask that your food be prepared with less salt or none, if possible.  HOW DO I READ FOOD LABELS FOR SODIUM INFORMATION? The Nutrition Facts label lists the amount of sodium in one serving of the food. If you eat more than one serving, you must  multiply the listed amount of sodium by the number of servings. Food labels may also identify foods as:  Sodium free--Less than 5 mg in a serving.  Very low sodium--35 mg or less in a serving.  Low sodium--140 mg or less in a serving.  Light in sodium--50% less sodium in a serving. For example, if a food that usually has 300 mg of sodium is changed to become light in sodium, it will have 150 mg of sodium.  Reduced sodium--25% less sodium in a serving. For example, if a food that usually has 400 mg of sodium is changed to reduced sodium, it will have 300 mg of sodium. WHAT FOODS CAN I EAT? Grains Low-sodium cereals, including oats, puffed wheat and rice, and shredded wheat cereals. Low-sodium crackers. Unsalted rice and pasta. Lower-sodium bread.  Vegetables Frozen or fresh vegetables. Low-sodium or reduced-sodium canned vegetables. Low-sodium or reduced-sodium tomato sauce and paste. Low-sodium or reduced-sodium tomato and vegetable juices.  Fruits Fresh, frozen, and canned fruit. Fruit juice.  Meat and Other Protein Products Low-sodium canned tuna and salmon. Fresh or frozen meat, poultry, seafood, and fish. Lamb. Unsalted nuts. Dried beans, peas, and lentils without added salt. Unsalted canned beans. Homemade soups without salt. Eggs.  Dairy Milk. Soy milk. Ricotta cheese. Low-sodium or reduced-sodium cheeses. Yogurt.  Condiments Fresh and dried herbs and spices. Salt-free seasonings. Onion and garlic powders. Low-sodium varieties of mustard and ketchup. Lemon juice.  Fats and Oils  Reduced-sodium salad dressings. Unsalted butter.  Other Unsalted popcorn and pretzels.  The items listed above may not be a complete list of recommended foods or beverages. Contact your dietitian for more options. WHAT FOODS ARE NOT RECOMMENDED? Grains Instant hot cereals. Bread stuffing, pancake, and biscuit mixes. Croutons. Seasoned rice or pasta mixes. Noodle soup cups. Boxed or frozen  macaroni and cheese. Self-rising flour. Regular salted crackers. Vegetables Regular canned vegetables. Regular canned tomato sauce and paste. Regular tomato and vegetable juices. Frozen vegetables in sauces. Salted french fries. Olives. Angie Fava. Relishes. Sauerkraut. Salsa. Meat and Other Protein Products Salted, canned, smoked, spiced, or pickled meats, seafood, or fish. Bacon, ham, sausage, hot dogs, corned beef, chipped beef, and packaged luncheon meats. Salt pork. Jerky. Pickled herring. Anchovies, regular canned tuna, and sardines. Salted nuts. Dairy Processed cheese and cheese spreads. Cheese curds. Blue cheese and cottage cheese. Buttermilk.  Condiments Onion and garlic salt, seasoned salt, table salt, and sea salt. Canned and packaged gravies. Worcestershire sauce. Tartar sauce. Barbecue sauce. Teriyaki sauce. Soy sauce, including reduced sodium. Steak sauce. Fish sauce. Oyster sauce. Cocktail sauce. Horseradish. Regular ketchup and mustard. Meat flavorings and tenderizers. Bouillon cubes. Hot sauce. Tabasco sauce. Marinades. Taco seasonings. Relishes. Fats and Oils Regular salad dressings. Salted butter. Margarine. Ghee. Bacon fat.  Other Potato and tortilla chips. Corn chips and puffs. Salted popcorn and pretzels. Canned or dried soups. Pizza. Frozen entrees and pot pies.  The items listed above may not be a complete list of foods and beverages to avoid. Contact your dietitian for more information. Document Released: 10/11/2001 Document Revised: 04/26/2013 Document Reviewed: 02/23/2013 [facility] Patient Information 2015 [place], [place]. This information is not intended to replace advice given to you by your health care provider. Make sure you discuss any questions you have with your health care provider.   Limit your sodium (Salt) intake

## 2014-01-23 ENCOUNTER — Encounter: Payer: Self-pay | Admitting: Internal Medicine

## 2014-01-23 ENCOUNTER — Ambulatory Visit (INDEPENDENT_AMBULATORY_CARE_PROVIDER_SITE_OTHER): Payer: Medicare HMO | Admitting: Internal Medicine

## 2014-01-23 ENCOUNTER — Ambulatory Visit (INDEPENDENT_AMBULATORY_CARE_PROVIDER_SITE_OTHER)
Admission: RE | Admit: 2014-01-23 | Discharge: 2014-01-23 | Disposition: A | Discharge status: Home / Self Care | Payer: Medicare HMO | Source: Ambulatory Visit | Attending: Internal Medicine | Admitting: Internal Medicine

## 2014-01-23 VITALS — BP 132/74 | HR 73 | Temp 98.0°F | Resp 20 | Ht 70.25 in | Wt 227.0 lb

## 2014-01-23 DIAGNOSIS — K219 Gastro-esophageal reflux disease without esophagitis: Secondary | ICD-10-CM

## 2014-01-23 DIAGNOSIS — M2550 Pain in unspecified joint: Secondary | ICD-10-CM

## 2014-01-23 DIAGNOSIS — E785 Hyperlipidemia, unspecified: Secondary | ICD-10-CM

## 2014-01-23 LAB — CBC WITH DIFFERENTIAL/PLATELET
BASOS ABS: 0 10*3/uL (ref 0.0–0.1)
BASOS PCT: 0.4 % (ref 0.0–3.0)
EOS ABS: 0.3 10*3/uL (ref 0.0–0.7)
Eosinophils Relative: 3.2 % (ref 0.0–5.0)
HCT: 34 % — ABNORMAL LOW (ref 39.0–52.0)
HEMOGLOBIN: 11.4 g/dL — AB (ref 13.0–17.0)
LYMPHS PCT: 12.9 % (ref 12.0–46.0)
Lymphs Abs: 1.1 10*3/uL (ref 0.7–4.0)
MCHC: 33.4 g/dL (ref 30.0–36.0)
MCV: 79.5 fl (ref 78.0–100.0)
MONO ABS: 0.6 10*3/uL (ref 0.1–1.0)
Monocytes Relative: 7.4 % (ref 3.0–12.0)
NEUTROS ABS: 6.5 10*3/uL (ref 1.4–7.7)
Neutrophils Relative %: 76.1 % (ref 43.0–77.0)
Platelets: 287 10*3/uL (ref 150.0–400.0)
RBC: 4.28 Mil/uL (ref 4.22–5.81)
RDW: 16.5 % — AB (ref 11.5–15.5)
WBC: 8.6 10*3/uL (ref 4.0–10.5)

## 2014-01-23 LAB — C-REACTIVE PROTEIN: CRP: 7.9 mg/dL (ref 0.5–20.0)

## 2014-01-23 LAB — SEDIMENTATION RATE: Sed Rate: 48 mm/hr — ABNORMAL HIGH (ref 0–22)

## 2014-01-23 NOTE — Progress Notes (Signed)
Pre visit review using our clinic review tool, if applicable. No additional management support is needed unless otherwise documented below in the visit note. 

## 2014-01-23 NOTE — Progress Notes (Signed)
Subjective:    Patient ID: Samuel Moran, male    DOB: 16-Oct-1945, 68 y.o.   MRN: [id number]  HPI 68 year old patient, who presents with a two-week history of increasing pain in multiple joints.  He has had some pain and stiffness involving the ankles, knees, wrists, and has had some swelling involving the small joints of the hands.  He has been seen by orthopedics fairly recently and was treated with a prednisone Dosepak and a brace for right knee pain.  He remains on diclofenac. Some mild early morning stiffness.  No other constitutional complaints  He has a history of treated dyslipidemia, as well as gastroesophageal reflux disease, which have been stable.  Treated with diet therapy, but no statin therapy  Past Medical History  Diagnosis Date  . GERD 07/01/2007  . HYPERLIPIDEMIA 06/29/2007  . 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 PROSTATE SPECIFIC ANTIGEN, ELEVATED 06/29/2007  . SEIZURE DISORDER 07/01/2007  . ED (erectile dysfunction)     History   Social History  . Marital Status: Single    Spouse Name: N/A    Number of Children: N/A  . Years of Education: N/A   Occupational History  . Not on file.   Social History Main Topics  . Smoking status: Never Smoker   . Smokeless tobacco: Never Used  . Alcohol Use: 2.0 oz/week    4 drink(s) per week  . Drug Use: No  . Sexual Activity: Not on file   Other Topics Concern  . Not on file   Social History Narrative  . No narrative on file    Past Surgical History  Procedure Laterality Date  . Amputation      right great toe - tramatic age 86    No family history on file.  No Known Allergies  Current Outpatient Prescriptions on File Prior to Visit  Medication Sig Dispense Refill  . aspirin 81 MG EC tablet Take 81 mg by mouth daily.        . furosemide (LASIX) 40 MG tablet Take 1 tablet (40 mg total) by mouth daily.  30 tablet  3  . Multiple Vitamins-Minerals (CENTRUM SILVER PO) Take by mouth daily.        . naproxen  sodium (ALEVE) 220 MG tablet Take 220 mg by mouth 2 (two) times daily with a meal.      . Omega-3 Fatty Acids (FISH OIL) 1000 MG CAPS Take by mouth daily.        Marland Kitchen omeprazole (PRILOSEC) 20 MG capsule Take 20 mg by mouth daily.        . tadalafil (CIALIS) 20 MG tablet Take 1 tablet (20 mg total) by mouth daily as needed.  12 tablet  12  . vardenafil (LEVITRA) 20 MG tablet Take 1 tablet (20 mg total) by mouth daily as needed for erectile dysfunction.  6 tablet  6   No current facility-administered medications on file prior to visit.    BP 132/74  Pulse 73  Temp(Src) 98 F (36.7 C) (Oral)  Resp 20  Ht 5' 10.25" (1.784 m)  Wt 227 lb (102.967 kg)  BMI 32.35 kg/m2  SpO2 97%      Review of Systems  Constitutional: Negative for fever, chills, appetite change and fatigue.  HENT: Negative for congestion, dental problem, ear pain, hearing loss, sore throat, tinnitus, trouble swallowing and voice change.   Eyes: Negative for pain, discharge and visual disturbance.  Respiratory: Negative for cough, chest tightness, wheezing and stridor.  Cardiovascular: Negative for chest pain, palpitations and leg swelling.  Gastrointestinal: Negative for nausea, vomiting, abdominal pain, diarrhea, constipation, blood in stool and abdominal distention.  Genitourinary: Negative for urgency, hematuria, flank pain, discharge, difficulty urinating and genital sores.  Musculoskeletal: Positive for arthralgias, gait problem and joint swelling. Negative for back pain, myalgias and neck stiffness.  Skin: Negative for rash.  Neurological: Negative for dizziness, syncope, speech difficulty, weakness, numbness and headaches.  Hematological: Negative for adenopathy. Does not bruise/bleed easily.  Psychiatric/Behavioral: Negative for behavioral problems and dysphoric mood. The patient is not nervous/anxious.        Objective:   Physical Exam  Constitutional: He appears well-developed. No distress.  Musculoskeletal:   Soft tissue swelling over the second and third MCP and PIP joints bilaterally Mild swelling left knee Mild pedal edema          Assessment & Plan:   Symmetrical polyarthralgias.  Rule out RA We'll continue anti-inflammatory medication  We'll obtain radiographs and screening lab Will probably benefit from rheumatology referral

## 2014-01-23 NOTE — Patient Instructions (Signed)
X-rays as discussed Continue diclofenac  Call or return to clinic prn if these symptoms worsen or fail to improve as anticipated.

## 2014-01-24 ENCOUNTER — Other Ambulatory Visit: Payer: Self-pay | Admitting: Internal Medicine

## 2014-01-24 DIAGNOSIS — R700 Elevated erythrocyte sedimentation rate: Secondary | ICD-10-CM

## 2014-01-24 LAB — CYCLIC CITRUL PEPTIDE ANTIBODY, IGG: Cyclic Citrullin Peptide Ab: 55.5 U/mL — ABNORMAL HIGH (ref 0.0–5.0)

## 2014-01-24 LAB — RHEUMATOID FACTOR: Rhuematoid fact SerPl-aCnc: 15 IU/mL — ABNORMAL HIGH (ref <=14)

## 2014-01-26 ENCOUNTER — Telehealth: Payer: Self-pay | Admitting: Internal Medicine

## 2014-01-26 MED ORDER — PREDNISONE 10 MG PO TABS: ORAL_TABLET | ORAL | Status: DC

## 2014-01-26 MED ORDER — HYDROCODONE-ACETAMINOPHEN 5-325 MG PO TABS: 1.0000 | ORAL_TABLET | Freq: Four times a day (QID) | ORAL | Status: DC | PRN

## 2014-01-26 NOTE — Telephone Encounter (Signed)
Spoke to pt, told him Rx for Hydrocodone pain medication was ready for pickup, and I sent a Rx for Prednisone 10 mg one tablet twice a day x 1 week and then one daily to pharmacy. Pt verbalized understanding. Rx printed and signed.

## 2014-01-26 NOTE — Telephone Encounter (Signed)
Pt was seen on 01-23-14 and waiting on rheumatologist appt and needs pain med call into [facility]. Pt is wondering would cortisone inj help

## 2014-01-26 NOTE — Telephone Encounter (Signed)
Prednisone 10 mg , #30 One twice daily for one week and then one every morning  Generic Vicodin 5-325, #60 , one every 6 hours as needed for pain

## 2014-01-26 NOTE — Telephone Encounter (Signed)
Please see message and advise 

## 2014-02-15 ENCOUNTER — Telehealth: Payer: Self-pay | Admitting: Internal Medicine

## 2014-02-15 MED ORDER — PREDNISONE 10 MG PO TABS: 10.0000 mg | ORAL_TABLET | Freq: Every day | ORAL | Status: DC

## 2014-02-15 NOTE — Telephone Encounter (Signed)
Left detailed message refill for Prednisone sent to pharmacy.

## 2014-02-15 NOTE — Telephone Encounter (Signed)
Please see message and advise 

## 2014-02-15 NOTE — Telephone Encounter (Signed)
Please RF prednisone

## 2014-02-15 NOTE — Telephone Encounter (Signed)
Pt can not see the rheumatologist until 03-14-14. He said he is taking prednisone and dont think it will last until he see the doctor. He is asking for a refill or something else for pain   Pharm[facility]

## 2014-02-20 ENCOUNTER — Telehealth: Payer: Self-pay | Admitting: Internal Medicine

## 2014-02-20 NOTE — Telephone Encounter (Signed)
Left message on voicemail to call office.  

## 2014-02-20 NOTE — Telephone Encounter (Signed)
Okay to add coconut oil, but not aware of any studies that proved efficacy. Suggest continue prednisone, which, hopefully will be short-term

## 2014-02-20 NOTE — Telephone Encounter (Signed)
Pt called to ask what Dr Raliegh Ip would think about him using coconut oil instead of prednisone.   Cell phone [phone]

## 2014-02-20 NOTE — Telephone Encounter (Signed)
See message and advise

## 2014-02-20 NOTE — Telephone Encounter (Signed)
Pt called back told him, Okay to add coconut oil, but not aware of any studies that proved efficacy. Suggest continue prednisone, which, hopefully will be short-term per Dr. Raliegh Ip. Pt verbalized understanding.

## 2014-04-16 ENCOUNTER — Other Ambulatory Visit: Payer: Self-pay | Admitting: Internal Medicine

## 2014-05-17 ENCOUNTER — Other Ambulatory Visit: Payer: Self-pay | Admitting: Internal Medicine

## 2014-08-11 ENCOUNTER — Ambulatory Visit (INDEPENDENT_AMBULATORY_CARE_PROVIDER_SITE_OTHER): Payer: Medicare HMO | Admitting: Adult Health

## 2014-08-11 ENCOUNTER — Encounter: Payer: Self-pay | Admitting: Adult Health

## 2014-08-11 VITALS — BP 110/84 | Temp 98.3°F | Wt 225.3 lb

## 2014-08-11 DIAGNOSIS — M179 Osteoarthritis of knee, unspecified: Secondary | ICD-10-CM

## 2014-08-11 DIAGNOSIS — M1711 Unilateral primary osteoarthritis, right knee: Secondary | ICD-10-CM

## 2014-08-11 MED ORDER — METHYLPREDNISOLONE ACETATE 80 MG/ML IJ SUSP
80.0000 mg | Freq: Once | INTRAMUSCULAR | Status: AC
  Administered 2014-08-11: 80 mg via INTRA_ARTICULAR

## 2014-08-11 MED ORDER — METHYLPREDNISOLONE ACETATE 40 MG/ML IJ SUSP: 40.0000 mg | Freq: Once | INTRAMUSCULAR | Status: DC

## 2014-08-11 NOTE — Addendum Note (Signed)
Addended by: Apolinar Junes on: 08/11/2014 04:55 PM   Modules accepted: Level of Service, SmartSet

## 2014-08-11 NOTE — Patient Instructions (Addendum)
It was great meeting you! I hope this injection makes your knee feel better. If you have any complications such as continued pain after the first two days, redness, increased swelling or warmth at the site of injection, please let me know. Continue to follow up with Rheumatology. Enjoy the concert!  Joint Injection Care After Refer to this sheet in the next few days. These instructions provide you with information on caring for yourself after you have had a joint injection. Your caregiver also may give you more specific instructions. Your treatment has been planned according to current medical practices, but problems sometimes occur. Call your caregiver if you have any problems or questions after your procedure. After any type of joint injection, it is not uncommon to experience:  Soreness, swelling, or bruising around the injection site.  Mild numbness, tingling, or weakness around the injection site caused by the numbing medicine used before or with the injection. It also is possible to experience the following effects associated with the specific agent after injection:  Iodine-based contrast agents:  Allergic reaction (itching, hives, widespread redness, and swelling beyond the injection site).  Corticosteroids (These effects are rare.):  Allergic reaction.  Increased blood sugar levels (If you have diabetes and you notice that your blood sugar levels have increased, notify your caregiver).  Increased blood pressure levels.  Mood swings.  Hyaluronic acid in the use of viscosupplementation.  Temporary heat or redness.  Temporary rash and itching.  Increased fluid accumulation in the injected joint. These effects all should resolve within a day after your procedure.  HOME CARE INSTRUCTIONS  Limit yourself to light activity the day of your procedure. Avoid lifting heavy objects, bending, stooping, or twisting.  Take prescription or over-the-counter pain medication as directed by  your caregiver.  You may apply ice to your injection site to reduce pain and swelling the day of your procedure. Ice may be applied 03-04 times:  Put ice in a plastic bag.  Place a towel between your skin and the bag.  Leave the ice on for no longer than 15-20 minutes each time. SEEK IMMEDIATE MEDICAL CARE IF:   Pain and swelling get worse rather than better or extend beyond the injection site.  Numbness does not go away.  Blood or fluid continues to leak from the injection site.  You have chest pain.  You have swelling of your face or tongue.  You have trouble breathing or you become dizzy.  You develop a fever, chills, or severe tenderness at the injection site that last longer than 1 day. MAKE SURE YOU:  Understand these instructions.  Watch your condition.  Get help right away if you are not doing well or if you get worse. Document Released: 01/02/2011 Document Revised: 07/14/2011 Document Reviewed: 01/02/2011 [facility] Patient Information 2015 [place], [place]. This information is not intended to replace advice given to you by your health care provider. Make sure you discuss any questions you have with your health care provider.

## 2014-08-11 NOTE — Progress Notes (Addendum)
   Subjective:    Patient ID: Samuel Moran, male    DOB: 1945-06-15, 69 y.o.   MRN: [id number]  HPI  Samuel Moran presents to the office today for steroid injection into the left knee. Per patient he tried to get into his rheumatologist Dr. Berna Bue at [facility], but that they could not get him in till Monday and was told that he could go to his PCP to get it done. He has a history of OA and is taking prednisone 10 mg daily as well as Methotrexate 2.5 daily. He endorses feeling that his left knee is "loose" also endorses swelling on left knee.   Review of Systems  Constitutional: Negative for activity change and appetite change.  Musculoskeletal: Positive for joint swelling and arthralgias.       Objective:   Physical Exam  Constitutional: He is oriented to person, place, and time. He appears well-developed and well-nourished. No distress.  Musculoskeletal: He exhibits no tenderness.  Moderatewelling to prepatellar and lateral aspect right knee, no crepitus felt, no erythemia  Neurological: He is alert and oriented to person, place, and time.  Skin: Skin is warm and dry.  Psychiatric: He has a normal mood and affect. His behavior is normal. Judgment and thought content normal.       Assessment & Plan:  Right knee injection - Consent was signed by patient and risks and benefits reviewed with patient in which he understands at this time. Using sterile technique the patient's right knee was prepped with betadine. Local anesthesia was obtained with topical lidocaine spray. 80 mg of Depo-Medrol and 68ml lidocaine was injected into the right lateral joint space of the knee. The patient tolerated the procedure without complications. Post injection care was discussed with patient.

## 2014-08-11 NOTE — Addendum Note (Signed)
Addended by: Apolinar Junes on: 08/11/2014 04:56 PM   Modules accepted: Miquel Dunn

## 2014-08-14 ENCOUNTER — Other Ambulatory Visit: Payer: Self-pay | Admitting: Internal Medicine

## 2014-09-22 DIAGNOSIS — M48061 Spinal stenosis, lumbar region without neurogenic claudication: Secondary | ICD-10-CM

## 2014-09-22 HISTORY — DX: Spinal stenosis, lumbar region without neurogenic claudication: M48.061

## 2014-09-27 ENCOUNTER — Other Ambulatory Visit: Payer: Self-pay | Admitting: Internal Medicine

## 2014-10-17 ENCOUNTER — Other Ambulatory Visit (INDEPENDENT_AMBULATORY_CARE_PROVIDER_SITE_OTHER): Payer: Medicare HMO

## 2014-10-17 DIAGNOSIS — E785 Hyperlipidemia, unspecified: Secondary | ICD-10-CM | POA: Diagnosis not present

## 2014-10-17 DIAGNOSIS — Z0000 Encounter for general adult medical examination without abnormal findings: Secondary | ICD-10-CM

## 2014-10-17 LAB — BASIC METABOLIC PANEL
BUN: 21 mg/dL (ref 6–23)
CO2: 26 meq/L (ref 19–32)
CREATININE: 1.02 mg/dL (ref 0.40–1.50)
Calcium: 8.9 mg/dL (ref 8.4–10.5)
Chloride: 104 mEq/L (ref 96–112)
GFR: 77.38 mL/min (ref >60.00)
Glucose, Bld: 91 mg/dL (ref 70–99)
Potassium: 3.8 mEq/L (ref 3.5–5.1)
Sodium: 137 mEq/L (ref 135–145)

## 2014-10-17 LAB — POCT URINALYSIS DIPSTICK
BILIRUBIN UA: NEGATIVE
Blood, UA: NEGATIVE
Glucose, UA: NEGATIVE
KETONES UA: NEGATIVE
Leukocytes, UA: NEGATIVE
Nitrite, UA: NEGATIVE
Protein, UA: NEGATIVE
Spec Grav, UA: 1.015
Urobilinogen, UA: 0.2
pH, UA: 6

## 2014-10-17 LAB — CBC WITH DIFFERENTIAL/PLATELET
Basophils Absolute: 0 10*3/uL (ref 0.0–0.1)
Basophils Relative: 0.3 % (ref 0.0–3.0)
EOS PCT: 3 % (ref 0.0–5.0)
Eosinophils Absolute: 0.2 10*3/uL (ref 0.0–0.7)
HCT: 38.9 % — ABNORMAL LOW (ref 39.0–52.0)
Hemoglobin: 13.1 g/dL (ref 13.0–17.0)
LYMPHS PCT: 21.2 % (ref 12.0–46.0)
Lymphs Abs: 1.8 10*3/uL (ref 0.7–4.0)
MCHC: 33.7 g/dL (ref 30.0–36.0)
MCV: 94.8 fl (ref 78.0–100.0)
MONOS PCT: 8.1 % (ref 3.0–12.0)
Monocytes Absolute: 0.7 10*3/uL (ref 0.1–1.0)
NEUTROS ABS: 5.6 10*3/uL (ref 1.4–7.7)
NEUTROS PCT: 67.4 % (ref 43.0–77.0)
Platelets: 236 10*3/uL (ref 150.0–400.0)
RBC: 4.1 Mil/uL — ABNORMAL LOW (ref 4.22–5.81)
RDW: 14.7 % (ref 11.5–15.5)
WBC: 8.3 10*3/uL (ref 4.0–10.5)

## 2014-10-17 LAB — LIPID PANEL
CHOLESTEROL: 196 mg/dL (ref 0–200)
HDL: 42.7 mg/dL (ref >39.00)
LDL Cholesterol: 119 mg/dL — ABNORMAL HIGH (ref 0–99)
NonHDL: 153.3
TRIGLYCERIDES: 171 mg/dL — AB (ref 0.0–149.0)
Total CHOL/HDL Ratio: 5
VLDL: 34.2 mg/dL (ref 0.0–40.0)

## 2014-10-17 LAB — HEPATIC FUNCTION PANEL
ALBUMIN: 4 g/dL (ref 3.5–5.2)
ALK PHOS: 34 U/L — AB (ref 39–117)
ALT: 15 U/L (ref 0–53)
AST: 16 U/L (ref 0–37)
BILIRUBIN DIRECT: 0.2 mg/dL (ref 0.0–0.3)
Total Bilirubin: 1.1 mg/dL (ref 0.2–1.2)
Total Protein: 6.4 g/dL (ref 6.0–8.3)

## 2014-10-17 LAB — PSA: PSA: 0.39 ng/mL (ref 0.10–4.00)

## 2014-10-17 LAB — TSH: TSH: 2.29 u[IU]/mL (ref 0.35–4.50)

## 2014-10-23 ENCOUNTER — Ambulatory Visit (INDEPENDENT_AMBULATORY_CARE_PROVIDER_SITE_OTHER): Payer: Medicare HMO | Admitting: Internal Medicine

## 2014-10-23 ENCOUNTER — Encounter: Payer: Self-pay | Admitting: Internal Medicine

## 2014-10-23 VITALS — BP 120/80 | HR 79 | Temp 98.7°F | Resp 18 | Ht 70.0 in | Wt 223.0 lb

## 2014-10-23 DIAGNOSIS — E785 Hyperlipidemia, unspecified: Secondary | ICD-10-CM

## 2014-10-23 DIAGNOSIS — Z0000 Encounter for general adult medical examination without abnormal findings: Secondary | ICD-10-CM

## 2014-10-23 DIAGNOSIS — Z23 Encounter for immunization: Secondary | ICD-10-CM

## 2014-10-23 DIAGNOSIS — M069 Rheumatoid arthritis, unspecified: Secondary | ICD-10-CM

## 2014-10-23 DIAGNOSIS — K219 Gastro-esophageal reflux disease without esophagitis: Secondary | ICD-10-CM | POA: Diagnosis not present

## 2014-10-23 NOTE — Patient Instructions (Signed)
Limit your sodium (Salt) intake    It is important that you exercise regularly, at least 20 minutes 3 to 4 times per week.  If you develop chest pain or shortness of breath seek  medical attention.  Return in one year for follow-up  Confirm the date of your last colonoscopy and reschedule if it has been greater than 10 years  Health Maintenance A healthy lifestyle and preventative care can promote health and wellness.  Maintain regular health, dental, and eye exams.  Eat a healthy diet. Foods like vegetables, fruits, whole grains, low-fat dairy products, and lean protein foods contain the nutrients you need and are low in calories. Decrease your intake of foods high in solid fats, added sugars, and salt. Get information about a proper diet from your health care provider, if necessary.  Regular physical exercise is one of the most important things you can do for your health. Most adults should get at least 150 minutes of moderate-intensity exercise (any activity that increases your heart rate and causes you to sweat) each week. In addition, most adults need muscle-strengthening exercises on 2 or more days a week.   Maintain a healthy weight. The body mass index (BMI) is a screening tool to identify possible weight problems. It provides an estimate of body fat based on height and weight. Your health care provider can find your BMI and can help you achieve or maintain a healthy weight. For males 20 years and older:  A BMI below 18.5 is considered underweight.  A BMI of 18.5 to 24.9 is normal.  A BMI of 25 to 29.9 is considered overweight.  A BMI of 30 and above is considered obese.  Maintain normal blood lipids and cholesterol by exercising and minimizing your intake of saturated fat. Eat a balanced diet with plenty of fruits and vegetables. Blood tests for lipids and cholesterol should begin at age 53 and be repeated every 5 years. If your lipid or cholesterol levels are high, you are over  age 29, or you are at high risk for heart disease, you may need your cholesterol levels checked more frequently.Ongoing high lipid and cholesterol levels should be treated with medicines if diet and exercise are not working.  If you smoke, find out from your health care provider how to quit. If you do not use tobacco, do not start.  Lung cancer screening is recommended for adults aged 55-80 years who are at high risk for developing lung cancer because of a history of smoking. A yearly low-dose CT scan of the lungs is recommended for people who have at least a 30-pack-year history of smoking and are current smokers or have quit within the past 15 years. A pack year of smoking is smoking an average of 1 pack of cigarettes a day for 1 year (for example, a 30-pack-year history of smoking could mean smoking 1 pack a day for 30 years or 2 packs a day for 15 years). Yearly screening should continue until the smoker has stopped smoking for at least 15 years. Yearly screening should be stopped for people who develop a health problem that would prevent them from having lung cancer treatment.  If you choose to drink alcohol, do not have more than 2 drinks per day. One drink is considered to be 12 oz (360 mL) of beer, 5 oz (150 mL) of wine, or 1.5 oz (45 mL) of liquor.  Avoid the use of street drugs. Do not share needles with anyone. Ask for help  if you need support or instructions about stopping the use of drugs.  High blood pressure causes heart disease and increases the risk of stroke. Blood pressure should be checked at least every 1-2 years. Ongoing high blood pressure should be treated with medicines if weight loss and exercise are not effective.  If you are 80-10 years old, ask your health care provider if you should take aspirin to prevent heart disease.  Diabetes screening involves taking a blood sample to check your fasting blood sugar level. This should be done once every 3 years after age 13 if you  are at a normal weight and without risk factors for diabetes. Testing should be considered at a younger age or be carried out more frequently if you are overweight and have at least 1 risk factor for diabetes.  Colorectal cancer can be detected and often prevented. Most routine colorectal cancer screening begins at the age of 70 and continues through age 56. However, your health care provider may recommend screening at an earlier age if you have risk factors for colon cancer. On a yearly basis, your health care provider may provide home test kits to check for hidden blood in the stool. A small camera at the end of a tube may be used to directly examine the colon (sigmoidoscopy or colonoscopy) to detect the earliest forms of colorectal cancer. Talk to your health care provider about this at age 9 when routine screening begins. A direct exam of the colon should be repeated every 5-10 years through age 79, unless early forms of precancerous polyps or small growths are found.  People who are at an increased risk for hepatitis B should be screened for this virus. You are considered at high risk for hepatitis B if:  You were born in a country where hepatitis B occurs often. Talk with your health care provider about which countries are considered high risk.  Your parents were born in a high-risk country and you have not received a shot to protect against hepatitis B (hepatitis B vaccine).  You have HIV or AIDS.  You use needles to inject street drugs.  You live with, or have sex with, someone who has hepatitis B.  You are a man who has sex with other men (MSM).  You get hemodialysis treatment.  You take certain medicines for conditions like cancer, organ transplantation, and autoimmune conditions.  Hepatitis C blood testing is recommended for all people born from [birth year] through 1963 and any individual with known risk factors for hepatitis C.  Healthy men should no longer receive prostate-specific  antigen (PSA) blood tests as part of routine cancer screening. Talk to your health care provider about prostate cancer screening.  Testicular cancer screening is not recommended for adolescents or adult males who have no symptoms. Screening includes self-exam, a health care provider exam, and other screening tests. Consult with your health care provider about any symptoms you have or any concerns you have about testicular cancer.  Practice safe sex. Use condoms and avoid high-risk sexual practices to reduce the spread of sexually transmitted infections (STIs).  You should be screened for STIs, including gonorrhea and chlamydia if:  You are sexually active and are younger than 24 years.  You are older than 24 years, and your health care provider tells you that you are at risk for this type of infection.  Your sexual activity has changed since you were last screened, and you are at an increased risk for chlamydia or gonorrhea. Ask  your health care provider if you are at risk.  If you are at risk of being infected with HIV, it is recommended that you take a prescription medicine daily to prevent HIV infection. This is called pre-exposure prophylaxis (PrEP). You are considered at risk if:  You are a man who has sex with other men (MSM).  You are a heterosexual man who is sexually active with multiple partners.  You take drugs by injection.  You are sexually active with a partner who has HIV.  Talk with your health care provider about whether you are at high risk of being infected with HIV. If you choose to begin PrEP, you should first be tested for HIV. You should then be tested every 3 months for as long as you are taking PrEP.  Use sunscreen. Apply sunscreen liberally and repeatedly throughout the day. You should seek shade when your shadow is shorter than you. Protect yourself by wearing long sleeves, pants, a wide-brimmed hat, and sunglasses year round whenever you are outdoors.  Tell  your health care provider of new moles or changes in moles, especially if there is a change in shape or color. Also, tell your health care provider if a mole is larger than the size of a pencil eraser.  A one-time screening for abdominal aortic aneurysm (AAA) and surgical repair of large AAAs by ultrasound is recommended for men aged 39-75 years who are current or former smokers.  Stay current with your vaccines (immunizations). Document Released: 10/18/2007 Document Revised: 04/26/2013 Document Reviewed: 09/16/2010 [facility] Patient Information 2015 [place], [place]. This information is not intended to replace advice given to you by your health care provider. Make sure you discuss any questions you have with your health care provider.

## 2014-10-23 NOTE — Progress Notes (Signed)
Pre visit review using our clinic review tool, if applicable. No additional management support is needed unless otherwise documented below in the visit note. 

## 2014-10-23 NOTE — Progress Notes (Signed)
Subjective:    Patient ID: Samuel Moran, male    DOB: Jan 06, 1946, 69 y.o.   MRN: [id number]  HPI  69 year old patient who is seen today for a wellness exam.  The patient is followed closely by rheumatology due to RA.  Patient hopes to start Humira in the near future.  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 GERD 07/01/2007  . HYPERLIPIDEMIA 06/29/2007  . 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 PROSTATE SPECIFIC ANTIGEN, ELEVATED 06/29/2007  . SEIZURE DISORDER 07/01/2007  . ED (erectile dysfunction)   . Spinal stenosis of lumbar region 09/22/2014   1. Risk factors, based on past  M,S,F history.  History of mild dyslipidemia.  Otherwise no significant cardiovascular risk factors  2.  Physical activities: In spite of arthritis remains fairly active.  Does go to the gym 2-3 times per week and walks his dog daily  3.  Depression/mood: No history of depression or mood disorder  4.  Hearing: No deficits  5.  ADL's: Independent  6.  Fall risk: Low  7.  Home safety: No problems identified  8.  Height weight, and visual acuity; height and weight stable no change in visual acuity  9.  Counseling: Heart healthy diet regular exercise.  All encouraged   10. Lab orders based on risk factors: Laboratory profile including lipid panel reviewed   11. Referral : Follow-up  rheumatology.  12. Care plan: The patient will check with his GI consultant.  He may need follow-up colonoscopy  13. Cognitive assessment: Alert in order with normal affect.  No cognitive dysfunction  14. Screening: Patient provided with a written and personalized 5-10 year screening schedule in the AVS.  .  The patient will have colonoscopies at 10 year intervals.  Annual clinical exams with screening labs.  Recommended  15. Provider List Update: Rheumatology GI primary care medicine and ophthalmology     Review of Systems  Constitutional: Negative for fever, chills, activity change, appetite change and fatigue.  HENT: Negative for congestion, dental problem, ear pain, hearing loss, mouth sores, rhinorrhea, sinus pressure, sneezing, tinnitus, trouble swallowing and voice change.   Eyes: Negative for photophobia, pain, redness and visual disturbance.  Respiratory: Negative for apnea, cough, choking, chest tightness, shortness of breath and wheezing.   Cardiovascular: Negative for chest pain, palpitations and leg swelling.  Gastrointestinal: Negative for nausea, vomiting, abdominal pain, diarrhea, constipation, blood in stool, abdominal distention, anal bleeding and rectal pain.  Genitourinary: Negative for dysuria, urgency, frequency, hematuria, flank pain, decreased urine volume, discharge, penile swelling, scrotal swelling, difficulty urinating, genital sores and testicular pain.  Musculoskeletal: Positive for arthralgias. Negative for myalgias, back pain, joint swelling, gait problem, neck pain and neck stiffness.  Skin: Negative for color change, rash and wound.  Neurological: Negative for dizziness, tremors, seizures, syncope, facial asymmetry, speech difficulty, weakness, light-headedness, numbness and headaches.  Hematological: Negative for adenopathy. Does not  bruise/bleed easily.  Psychiatric/Behavioral: Negative for suicidal ideas, hallucinations, behavioral problems, confusion,  sleep disturbance, self-injury, dysphoric mood, decreased concentration and agitation. The patient is not nervous/anxious.        Objective:   Physical Exam  Constitutional: He appears well-developed and well-nourished.  HENT:  Head: Normocephalic and atraumatic.  Right Ear: External ear normal.  Left Ear: External ear normal.  Nose: Nose normal.  Mouth/Throat: Oropharynx is clear and moist.  Eyes: Conjunctivae and EOM are normal. Pupils are equal, round, and reactive to light. No scleral icterus.  Neck: Normal range of motion. Neck supple. No JVD present. No thyromegaly present.  Cardiovascular: Regular rhythm, normal heart sounds and intact distal pulses.  Exam reveals no gallop and no friction rub.   No murmur heard. Pulmonary/Chest: Effort normal and breath sounds normal. He exhibits no tenderness.  Abdominal: Soft. Bowel sounds are normal. He exhibits no distension and no mass. There is no tenderness.  Genitourinary: Prostate normal and penis normal.  Musculoskeletal: Normal range of motion. He exhibits no edema or tenderness.  Slight excessive warmth right knee Status post amputation right great toe with some soft tissue swelling about the stump site  Lymphadenopathy:    He has no cervical adenopathy.  Neurological: He is alert. He has normal reflexes. No cranial nerve deficit. Coordination normal.  Skin: Skin is warm and dry. No rash noted.  Psychiatric: He has a normal mood and affect. His behavior is normal.          Assessment & Plan:   Preventive health examination.  Patient will check with his gastroenterologist about follow-up colonoscopy.  Laboratory studies reviewed.  Heart healthy diet.  Encouraged as well as regular exercise Dyslipidemia Rheumatoid arthritis.  Follow-up rheumatology  Recheck one year or as needed

## 2014-10-24 ENCOUNTER — Telehealth: Payer: Self-pay | Admitting: Internal Medicine

## 2014-10-24 NOTE — Telephone Encounter (Signed)
Pts chart updated

## 2014-10-24 NOTE — Telephone Encounter (Signed)
FYI pt had last colonoscopy on 04/18/2003 at [facility] phone [phone]

## 2015-01-31 ENCOUNTER — Telehealth: Payer: Self-pay | Admitting: Internal Medicine

## 2015-01-31 NOTE — Telephone Encounter (Signed)
Pt has the blood test written down that dr [name] rheumatologist would like pt to have which is cbc, cmp.Can I sch?

## 2015-01-31 NOTE — Telephone Encounter (Signed)
Pt will get lab order from dr [name] and the lab is not contracted with his insurance. Pt will callback to sch

## 2015-01-31 NOTE — Telephone Encounter (Signed)
Need lab order from Dr. [name] with diagnosis codes. Why did pt not have labs done there?

## 2015-02-01 ENCOUNTER — Other Ambulatory Visit (INDEPENDENT_AMBULATORY_CARE_PROVIDER_SITE_OTHER): Payer: Medicare HMO

## 2015-02-01 ENCOUNTER — Other Ambulatory Visit: Payer: Self-pay | Admitting: Internal Medicine

## 2015-02-01 DIAGNOSIS — Z79899 Other long term (current) drug therapy: Secondary | ICD-10-CM | POA: Diagnosis not present

## 2015-02-01 LAB — CBC WITH DIFFERENTIAL/PLATELET
BASOS PCT: 0.1 % (ref 0.0–3.0)
Basophils Absolute: 0 10*3/uL (ref 0.0–0.1)
Eosinophils Absolute: 0.1 10*3/uL (ref 0.0–0.7)
Eosinophils Relative: 1.8 % (ref 0.0–5.0)
HCT: 40.1 % (ref 39.0–52.0)
Hemoglobin: 13.3 g/dL (ref 13.0–17.0)
LYMPHS ABS: 1.8 10*3/uL (ref 0.7–4.0)
Lymphocytes Relative: 22.4 % (ref 12.0–46.0)
MCHC: 33.2 g/dL (ref 30.0–36.0)
MCV: 91.6 fl (ref 78.0–100.0)
MONOS PCT: 6.2 % (ref 3.0–12.0)
Monocytes Absolute: 0.5 10*3/uL (ref 0.1–1.0)
NEUTROS ABS: 5.5 10*3/uL (ref 1.4–7.7)
NEUTROS PCT: 69.5 % (ref 43.0–77.0)
PLATELETS: 252 10*3/uL (ref 150.0–400.0)
RBC: 4.38 Mil/uL (ref 4.22–5.81)
RDW: 14.9 % (ref 11.5–15.5)
WBC: 7.9 10*3/uL (ref 4.0–10.5)

## 2015-02-01 LAB — COMPREHENSIVE METABOLIC PANEL
ALT: 18 U/L (ref 0–53)
AST: 16 U/L (ref 0–37)
Albumin: 4.2 g/dL (ref 3.5–5.2)
Alkaline Phosphatase: 55 U/L (ref 39–117)
BUN: 23 mg/dL (ref 6–23)
CALCIUM: 9.1 mg/dL (ref 8.4–10.5)
CHLORIDE: 103 meq/L (ref 96–112)
CO2: 27 meq/L (ref 19–32)
Creatinine, Ser: 1.27 mg/dL (ref 0.40–1.50)
GFR: 60.03 mL/min (ref >60.00)
GLUCOSE: 122 mg/dL — AB (ref 70–99)
Potassium: 4.1 mEq/L (ref 3.5–5.1)
Sodium: 137 mEq/L (ref 135–145)
Total Bilirubin: 0.6 mg/dL (ref 0.2–1.2)
Total Protein: 7 g/dL (ref 6.0–8.3)

## 2015-02-05 ENCOUNTER — Other Ambulatory Visit: Payer: Self-pay | Admitting: Internal Medicine

## 2015-02-13 ENCOUNTER — Telehealth: Payer: Self-pay | Admitting: Internal Medicine

## 2015-02-13 NOTE — Telephone Encounter (Signed)
Pt would like to know if his lab results were sent to [facility] radiology

## 2015-02-13 NOTE — Telephone Encounter (Signed)
Spoke to pt, asked him for Dr. [name] fax number. Pt said [phone]. Told him okay will fax lab results to her. Pt verbalized understanding.  Lab results faxed to Dr. [name] Rheumatologist.

## 2015-03-28 ENCOUNTER — Encounter: Payer: Self-pay | Admitting: Internal Medicine

## 2015-03-28 ENCOUNTER — Ambulatory Visit (INDEPENDENT_AMBULATORY_CARE_PROVIDER_SITE_OTHER): Payer: Medicare HMO | Admitting: Internal Medicine

## 2015-03-28 VITALS — BP 116/70 | Temp 98.7°F | Wt 228.0 lb

## 2015-03-28 DIAGNOSIS — Z79899 Other long term (current) drug therapy: Secondary | ICD-10-CM | POA: Diagnosis not present

## 2015-03-28 DIAGNOSIS — M069 Rheumatoid arthritis, unspecified: Secondary | ICD-10-CM | POA: Diagnosis not present

## 2015-03-28 DIAGNOSIS — R42 Dizziness and giddiness: Secondary | ICD-10-CM | POA: Diagnosis not present

## 2015-03-28 DIAGNOSIS — Z23 Encounter for immunization: Secondary | ICD-10-CM | POA: Diagnosis not present

## 2015-03-28 LAB — GLUCOSE, POCT (MANUAL RESULT ENTRY): POC Glucose: 99 mg/dl (ref 70–99)

## 2015-03-28 NOTE — Patient Instructions (Signed)
Your exam is reassuring  . Heart sounds good and I don't see  Problems on exam.  doesnt sound like a heart  Problem and not a stroke sx.   Suspect poss related to swings in blood sugar   Hydration and  Pre lund time. Although  Your blood sugar is ok . Prednisone and simple sugar breakfast such as waffles pancakes  breads biscuits could trigger this with some situations .  Get protein for breakfast and stay hydrated .   If recurring  I want you to make appt with Dr Raliegh Ip for fu .

## 2015-03-28 NOTE — Progress Notes (Signed)
Pre visit review using our clinic review tool, if applicable. No additional management support is needed unless otherwise documented below in the visit note.  Chief Complaint  Patient presents with  . Dizziness    Comes and goes X 1 month    HPI: Patient Samuel Moran  comes in today for SDA for  new problem evaluation. PCP Samuel Moran   Has hx of RA  Dyslipidemia    Intermittent dizzy spells  Lasting 30 seconds or less   Feel like unsteady lgiht headed   Has [place] on  One time was [employer]  Yesterday   And asked  Had to reach out to steady  And then previously  walking   To sandwich spot . For lunch  Light headed feeling  .  .  " Weak on my feet." feeling no spinning   ? Has had  ocass for   6 months .  30 seconds  Little light headed.  No change in voision hearing acute numbness   . On  pred  mtx  And now  Test study and helps  Her RA . On humira  Study .  Samuel Moran  Before lunch both times. Waffles .  Couple cups coffee no change  Etoh.  [place]  .with ice .   tingling  In skin and did pt. .. Ate blt.  Today at lunch now last afternoon  ROS: See pertinent positives and negatives per HPI. No cps ob bleeding falling  Has had righ arm  Sx at times poss  Pinched nerve  not related to above  . Does weight is active  No [place].   Past Medical History  Diagnosis Date  . GERD 07/01/2007  . HYPERLIPIDEMIA 06/29/2007  . 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 PROSTATE SPECIFIC ANTIGEN, ELEVATED 06/29/2007  . SEIZURE DISORDER 07/01/2007  . ED (erectile dysfunction)   . Spinal stenosis of lumbar region 09/22/2014    No family history on file.  Social History   Social History  . Marital Status: Single    Spouse Name: N/A  . Number of Children: N/A  . Years of Education: N/A   Social History Main Topics  . Smoking status: Never Smoker   . Smokeless tobacco: Never Used  . Alcohol Use: 2.0 oz/week    4 drink(s) per week  . Drug Use: No  . Sexual Activity: Not Asked   Other Topics  Concern  . None   Social History Narrative    Outpatient Prescriptions Prior to Visit  Medication Sig Dispense Refill  . acetaminophen (TYLENOL) 500 MG tablet Take 1,000-1,500 mg by mouth every 6 (six) hours as needed.    . folic acid (FOLVITE) 1 MG tablet Take 1 mg by mouth daily.  1  . methotrexate (RHEUMATREX) 2.5 MG tablet   0  . Multiple Vitamins-Minerals (CENTRUM SILVER PO) Take by mouth daily.      . Omega-3 Fatty Acids (FISH OIL) 1000 MG CAPS Take by mouth daily.      Marland Kitchen omeprazole (PRILOSEC) 20 MG capsule Take 20 mg by mouth daily.      . predniSONE (DELTASONE) 5 MG tablet TAKE 7.5 mg DAILY  3  . furosemide (LASIX) 40 MG tablet TAKE 1 TABLET BY MOUTH EVERY DAY (Patient not taking: Reported on 03/28/2015) 30 tablet 5  . traMADol (ULTRAM) 50 MG tablet Take 50 mg by mouth 3 (three) times daily as needed.  0  . aspirin 81 MG EC tablet  Take 81 mg by mouth daily.      . Glucosamine 500 MG CAPS Take 1 capsule by mouth daily.     No facility-administered medications prior to visit.     EXAM:  BP 116/70 mmHg  Temp(Src) 98.7 F (37.1 C) (Oral)  Wt 228 lb (103.42 kg)  Body mass index is 32.71 kg/(m^2).  GENERAL: vitals reviewed and listed above, alert, oriented, appears well hydrated and in no acute distress looks well cognition intact  HEENT: atraumatic, conjunctiva  clear, no obvious abnormalities on inspection of external nose and ears OP : no lesion edema or exudate  Tongue midline  NECK: no obvious masses on inspection palpation  LUNGS: clear to auscultation bilaterally, no wheezes, rales or rhonchi, CV: HRRR, no g or m  no clubbing cyanosis or  peripheral edema nl cap refill Abdomen:  Sof,t normal bowel sounds without hepatosplenomegaly, no guarding rebound or masses no CVA tenderness MS: moves all extremities without noticeable focal  abnormality PSYCH: pleasant and cooperative, no obvious depression or anxiety NEURO: oriented x 3 CN 3-12 appear intact. No focal muscle  weakness or atrophy. DTRs symmetrical.? Gait WNL.  Grossly non focal. No tremor or abnormal movement.   Lab Results  Component Value Date   WBC 7.9 02/01/2015   HGB 13.3 02/01/2015   HCT 40.1 02/01/2015   PLT 252.0 02/01/2015   GLUCOSE 122* 02/01/2015   CHOL 196 10/17/2014   TRIG 171.0* 10/17/2014   HDL 42.70 10/17/2014   LDLCALC 119* 10/17/2014   ALT 18 02/01/2015   AST 16 02/01/2015   Samuel Moran 137 02/01/2015   K 4.1 02/01/2015   CL 103 02/01/2015   CREATININE 1.27 02/01/2015   BUN 23 02/01/2015   CO2 27 02/01/2015   TSH 2.29 10/17/2014   PSA 0.39 10/17/2014      ASSESSMENT AND PLAN:  Discussed the following assessment and plan:  Dizzy spells - ocass pre lunch time  consdier glucose intolerance  hydration last seconds and no evidence of cv neuro events etc.  close observation diet change and fu  - Plan: POC Glucose (CBG), CANCELED: POCT urinalysis dipstick  Rheumatoid arthritis involving multiple sites, unspecified rheumatoid factor presence (HCC)  High risk medication use  able to lift weight and no assoc sx but in upright before lunch   .   -Patient advised to return or notify health care team  if symptoms worsen ,persist or new concerns arise. Total visit 66mins > 50% spent counseling and coordinating care as indicated in above note and in instructions to patient .   Patient Instructions  Your exam is reassuring  . Heart sounds good and I don't see  Problems on exam.  doesnt sound like a heart  Problem and not a stroke sx.   Suspect poss related to swings in blood sugar   Hydration and  Pre lund time. Although  Your blood sugar is ok . Prednisone and simple sugar breakfast such as waffles pancakes  breads biscuits could trigger this with some situations .  Get protein for breakfast and stay hydrated .   If recurring  I want you to make appt with Dr Raliegh Ip for fu .    Standley Brooking. Zarra Geffert M.D.

## 2015-04-06 DIAGNOSIS — M0570 Rheumatoid arthritis with rheumatoid factor of unspecified site without organ or systems involvement: Secondary | ICD-10-CM | POA: Diagnosis not present

## 2015-04-06 DIAGNOSIS — H16223 Keratoconjunctivitis sicca, not specified as Sjogren's, bilateral: Secondary | ICD-10-CM | POA: Diagnosis not present

## 2015-04-06 DIAGNOSIS — H2513 Age-related nuclear cataract, bilateral: Secondary | ICD-10-CM | POA: Diagnosis not present

## 2015-04-17 ENCOUNTER — Ambulatory Visit (INDEPENDENT_AMBULATORY_CARE_PROVIDER_SITE_OTHER)
Admission: RE | Admit: 2015-04-17 | Discharge: 2015-04-17 | Disposition: A | Discharge status: Home / Self Care | Payer: Medicare HMO | Source: Ambulatory Visit | Attending: Internal Medicine | Admitting: Internal Medicine

## 2015-04-17 ENCOUNTER — Encounter: Payer: Self-pay | Admitting: Internal Medicine

## 2015-04-17 ENCOUNTER — Ambulatory Visit (INDEPENDENT_AMBULATORY_CARE_PROVIDER_SITE_OTHER): Payer: Medicare HMO | Admitting: Internal Medicine

## 2015-04-17 VITALS — BP 100/60 | HR 76 | Temp 98.9°F | Ht 70.0 in | Wt 232.0 lb

## 2015-04-17 DIAGNOSIS — R5382 Chronic fatigue, unspecified: Secondary | ICD-10-CM | POA: Diagnosis not present

## 2015-04-17 DIAGNOSIS — M069 Rheumatoid arthritis, unspecified: Secondary | ICD-10-CM | POA: Diagnosis not present

## 2015-04-17 DIAGNOSIS — M25374 Other instability, right foot: Secondary | ICD-10-CM | POA: Diagnosis not present

## 2015-04-17 DIAGNOSIS — R5383 Other fatigue: Secondary | ICD-10-CM | POA: Diagnosis not present

## 2015-04-17 NOTE — Progress Notes (Signed)
Subjective:    Patient ID: Samuel Moran, male    DOB: [birth year], 69 y.o.   MRN: [id number]  HPI 69 year old patient who has a several month history of fatigue.  He describes daytime sleepiness, a sense of being tired with lack of energy.  He states that he has very little in the way of exercise intolerance including weight training. He has a history of RA and is on both Humira and methotrexate.  His prednisone has been down titrated to 5 mg daily, but not recently.  He has noted his blood pressure to running a bit lower.  Medical regimen does include furosemide with frequent urination.  He has had no peripheral edema except worsening swelling about a right first MTP amputation site  Past Medical History  Diagnosis Date  . GERD 07/01/2007  . HYPERLIPIDEMIA 06/29/2007  . 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 PROSTATE SPECIFIC ANTIGEN, ELEVATED 06/29/2007  . SEIZURE DISORDER 07/01/2007  . ED (erectile dysfunction)   . Spinal stenosis of lumbar region 09/22/2014    Social History   Social History  . Marital Status: Single    Spouse Name: N/A  . Number of Children: N/A  . Years of Education: N/A   Occupational History  . Not on file.   Social History Main Topics  . Smoking status: Never Smoker   . Smokeless tobacco: Never Used  . Alcohol Use: 2.0 oz/week    4 drink(s) per week  . Drug Use: No  . Sexual Activity: Not on file   Other Topics Concern  . Not on file   Social History Narrative    Past Surgical History  Procedure Laterality Date  . Amputation      right great toe - tramatic age 49    No family history on file.  No Known Allergies  Current Outpatient Prescriptions on File Prior to Visit  Medication Sig Dispense Refill  . acetaminophen (TYLENOL) 500 MG tablet Take 1,000-1,500 mg by mouth every 6 (six) hours as needed.    . Adalimumab (HUMIRA [place]) Inject into the skin. 1 INJECTION EVERY TWO WEEKS    . folic acid (FOLVITE) 1 MG tablet Take 1 mg by mouth  daily.  1  . methotrexate (RHEUMATREX) 2.5 MG tablet   0  . Multiple Vitamins-Minerals (CENTRUM SILVER PO) Take by mouth daily.      . Omega-3 Fatty Acids (FISH OIL) 1000 MG CAPS Take by mouth daily.      Marland Kitchen omeprazole (PRILOSEC) 20 MG capsule Take 20 mg by mouth daily.      . predniSONE (DELTASONE) 5 MG tablet TAKE 7.5 mg DAILY  3  . traMADol (ULTRAM) 50 MG tablet Take 50 mg by mouth 3 (three) times daily as needed.  0   No current facility-administered medications on file prior to visit.    BP 100/60 mmHg  Pulse 76  Temp(Src) 98.9 F (37.2 C) (Oral)  Ht 5\' 10"  (1.778 m)  Wt 232 lb (105.235 kg)  BMI 33.29 kg/m2  SpO2 96%      Review of Systems  Constitutional: Positive for fatigue. Negative for fever, chills and appetite change.  HENT: Negative for congestion, dental problem, ear pain, hearing loss, sore throat, tinnitus, trouble swallowing and voice change.   Eyes: Negative for pain, discharge and visual disturbance.  Respiratory: Negative for cough, chest tightness, wheezing and stridor.   Cardiovascular: Negative for chest pain, palpitations and leg swelling.  Gastrointestinal: Negative for nausea, vomiting, abdominal pain, diarrhea, constipation, blood  in stool and abdominal distention.  Genitourinary: Negative for urgency, hematuria, flank pain, discharge, difficulty urinating and genital sores.  Musculoskeletal: Negative for myalgias, back pain, joint swelling, arthralgias, gait problem and neck stiffness.  Skin: Negative for rash.  Neurological: Positive for weakness. Negative for dizziness, syncope, speech difficulty, numbness and headaches.  Hematological: Negative for adenopathy. Does not bruise/bleed easily.  Psychiatric/Behavioral: Positive for decreased concentration. Negative for behavioral problems and dysphoric mood. The patient is not nervous/anxious.        Objective:   Physical Exam  Constitutional: He is oriented to person, place, and time. He appears  well-developed.  HENT:  Head: Normocephalic.  Right Ear: External ear normal.  Left Ear: External ear normal.  Eyes: Conjunctivae and EOM are normal.  Neck: Normal range of motion.  Cardiovascular: Normal rate and normal heart sounds.   Pulmonary/Chest:  Scattered rhonchi  Abdominal: Bowel sounds are normal.  Musculoskeletal: Normal range of motion. He exhibits no edema or tenderness.  Prominent cystic mass about the right first MTP  amputation site  Neurological: He is alert and oriented to person, place, and time.  Psychiatric: He has a normal mood and affect. His behavior is normal.          Assessment & Plan:   Chronic fatigue Daytime sleepiness RA  We'll check some updated lab including sedimentation rate, TSH.  Check a chest x-ray.  Orthopedic referral for evaluation of the chronic swelling about the right first MTP amputation site  Follow-up rheumatology

## 2015-04-17 NOTE — Progress Notes (Signed)
Pre visit review using our clinic review tool, if applicable. No additional management support is needed unless otherwise documented below in the visit note. 

## 2015-04-17 NOTE — Patient Instructions (Addendum)
Rheumatology follow-up as scheduled  Discontinue furosemide  Orthopedic follow-up as scheduled

## 2015-04-18 ENCOUNTER — Telehealth: Payer: Self-pay

## 2015-04-18 DIAGNOSIS — M2550 Pain in unspecified joint: Secondary | ICD-10-CM | POA: Diagnosis not present

## 2015-04-18 DIAGNOSIS — M0579 Rheumatoid arthritis with rheumatoid factor of multiple sites without organ or systems involvement: Secondary | ICD-10-CM | POA: Diagnosis not present

## 2015-04-18 DIAGNOSIS — Z79899 Other long term (current) drug therapy: Secondary | ICD-10-CM | POA: Diagnosis not present

## 2015-04-18 DIAGNOSIS — R5383 Other fatigue: Secondary | ICD-10-CM | POA: Diagnosis not present

## 2015-04-18 LAB — CBC WITH DIFFERENTIAL/PLATELET
BASOS ABS: 0.1 10*3/uL (ref 0.0–0.1)
Basophils Relative: 0.8 % (ref 0.0–3.0)
EOS ABS: 0.1 10*3/uL (ref 0.0–0.7)
Eosinophils Relative: 1.1 % (ref 0.0–5.0)
HCT: 41 % (ref 39.0–52.0)
Hemoglobin: 13.5 g/dL (ref 13.0–17.0)
LYMPHS ABS: 1.9 10*3/uL (ref 0.7–4.0)
LYMPHS PCT: 28.7 % (ref 12.0–46.0)
MCHC: 32.9 g/dL (ref 30.0–36.0)
MCV: 91.2 fl (ref 78.0–100.0)
MONO ABS: 0.4 10*3/uL (ref 0.1–1.0)
Monocytes Relative: 6.5 % (ref 3.0–12.0)
NEUTROS ABS: 4.1 10*3/uL (ref 1.4–7.7)
NEUTROS PCT: 62.9 % (ref 43.0–77.0)
PLATELETS: 210 10*3/uL (ref 150.0–400.0)
RBC: 4.5 Mil/uL (ref 4.22–5.81)
RDW: 16 % — AB (ref 11.5–15.5)
WBC: 6.5 10*3/uL (ref 4.0–10.5)

## 2015-04-18 LAB — COMPREHENSIVE METABOLIC PANEL
ALT: 22 U/L (ref 0–53)
AST: 22 U/L (ref 0–37)
Albumin: 4 g/dL (ref 3.5–5.2)
Alkaline Phosphatase: 45 U/L (ref 39–117)
BILIRUBIN TOTAL: 0.7 mg/dL (ref 0.2–1.2)
BUN: 21 mg/dL (ref 6–23)
CHLORIDE: 104 meq/L (ref 96–112)
CO2: 27 meq/L (ref 19–32)
CREATININE: 1.22 mg/dL (ref 0.40–1.50)
Calcium: 8.8 mg/dL (ref 8.4–10.5)
GFR: 62.84 mL/min (ref >60.00)
GLUCOSE: 117 mg/dL — AB (ref 70–99)
Potassium: 4 mEq/L (ref 3.5–5.1)
SODIUM: 139 meq/L (ref 135–145)
Total Protein: 6.8 g/dL (ref 6.0–8.3)

## 2015-04-18 LAB — SEDIMENTATION RATE: SED RATE: 20 mm/h (ref 0–22)

## 2015-04-18 LAB — TSH: TSH: 1.15 u[IU]/mL (ref 0.35–4.50)

## 2015-04-18 LAB — CK: CK TOTAL: 63 U/L (ref 7–232)

## 2015-04-18 NOTE — Telephone Encounter (Signed)
Samuel Moran would like to know what is the next step since the lab were normal. Also he said he had an Xray done yesterday.    PLEASE ADVICE

## 2015-04-18 NOTE — Telephone Encounter (Signed)
Called and left message.

## 2015-04-19 DIAGNOSIS — M542 Cervicalgia: Secondary | ICD-10-CM | POA: Diagnosis not present

## 2015-04-25 DIAGNOSIS — M50122 Cervical disc disorder at C5-C6 level with radiculopathy: Secondary | ICD-10-CM | POA: Diagnosis not present

## 2015-04-25 DIAGNOSIS — M9902 Segmental and somatic dysfunction of thoracic region: Secondary | ICD-10-CM | POA: Diagnosis not present

## 2015-04-25 DIAGNOSIS — M9901 Segmental and somatic dysfunction of cervical region: Secondary | ICD-10-CM | POA: Diagnosis not present

## 2015-04-25 DIAGNOSIS — M62838 Other muscle spasm: Secondary | ICD-10-CM | POA: Diagnosis not present

## 2015-04-25 DIAGNOSIS — M50321 Other cervical disc degeneration at C4-C5 level: Secondary | ICD-10-CM | POA: Diagnosis not present

## 2015-04-25 DIAGNOSIS — M5384 Other specified dorsopathies, thoracic region: Secondary | ICD-10-CM | POA: Diagnosis not present

## 2015-04-25 DIAGNOSIS — M50322 Other cervical disc degeneration at C5-C6 level: Secondary | ICD-10-CM | POA: Diagnosis not present

## 2015-05-01 DIAGNOSIS — M62838 Other muscle spasm: Secondary | ICD-10-CM | POA: Diagnosis not present

## 2015-05-01 DIAGNOSIS — M50322 Other cervical disc degeneration at C5-C6 level: Secondary | ICD-10-CM | POA: Diagnosis not present

## 2015-05-01 DIAGNOSIS — M50321 Other cervical disc degeneration at C4-C5 level: Secondary | ICD-10-CM | POA: Diagnosis not present

## 2015-05-01 DIAGNOSIS — M9902 Segmental and somatic dysfunction of thoracic region: Secondary | ICD-10-CM | POA: Diagnosis not present

## 2015-05-01 DIAGNOSIS — M50122 Cervical disc disorder at C5-C6 level with radiculopathy: Secondary | ICD-10-CM | POA: Diagnosis not present

## 2015-05-01 DIAGNOSIS — M5384 Other specified dorsopathies, thoracic region: Secondary | ICD-10-CM | POA: Diagnosis not present

## 2015-05-01 DIAGNOSIS — M9901 Segmental and somatic dysfunction of cervical region: Secondary | ICD-10-CM | POA: Diagnosis not present

## 2015-05-02 ENCOUNTER — Telehealth: Payer: Self-pay | Admitting: Internal Medicine

## 2015-05-02 NOTE — Telephone Encounter (Signed)
Left detailed message on personal voicemail that Dr. Elease Hashimoto recommend you make an appt to be evaluated. Please call the office and schedule.

## 2015-05-02 NOTE — Telephone Encounter (Signed)
Recommend patient be evaluated here in office

## 2015-05-02 NOTE — Telephone Encounter (Signed)
Dr. Elease Hashimoto, please see message and advise.

## 2015-05-02 NOTE — Telephone Encounter (Signed)
PLEASE NOTE: All timestamps contained within this report are represented as [place] Standard Time. CONFIDENTIALTY NOTICE: This fax transmission is intended only for the addressee. It contains information that is legally privileged, confidential or otherwise protected from use or disclosure. If you are not the intended recipient, you are strictly prohibited from reviewing, disclosing, copying using or disseminating any of this information or taking any action in reliance on or regarding this information. If you have received this fax in error, please notify us immediately by telephone so that we can arrange for its return to [place]. Phone: [phone], Toll-Free: [phone], Fax: [phone] Page: 1 of 1 Call Id: [id number] [facility] - Client [place] Patient Name: Samuel Moran DOB: 11-04-1945 Initial Comment Caller states he has deep cough in chest, on Humira study. Nurse Assessment Nurse: Marcelline Deist, RN, Lynda Date/Time (Eastern Time): 03-Mar-202016 10:07:34 AM Confirm and document reason for call. If symptomatic, describe symptoms. ---Caller states he has deep cough in chest for a month, on Humira study. No fever. Has the patient traveled out of the country within the last 30 days? ---Not Applicable Does the patient have any new or worsening symptoms? ---Yes Will a triage be completed? ---Yes Related visit to physician within the last 2 weeks? ---No Does the PT have any chronic conditions? (i.e. diabetes, asthma, etc.) ---Yes List chronic conditions. ---RA Is this a behavioral health or substance abuse call? ---No Guidelines Guideline Title Affirmed Question Affirmed Notes Cough - Chronic [1] Continuous coughing keeps from working AND [2] no improvement using cough treatment per protocol Final Disposition User See Physician within [place], RN, [employer] would like an antibiotic called in if  possible. He uses [facility] in [place] on [address]. [address]. He gives himself shots each week of Humira. was told that he would need to stop Humira while on antibiotic. Please advise, contact caller at this #. Referrals REFERRED TO PCP OFFICE Disagree/Comply: Disagree Disagree/Comply Reason: Disagree with instructions

## 2015-05-02 NOTE — Telephone Encounter (Signed)
Spoke to pt, told him Dr. Raliegh Ip is not in the office this week. Pt said he just needs something for his cough, has tried OTC cough meds and nothing is working. He said cough is worse at night. It is just a dry cough, non productive. Pt is on Humira study did call Rheumatologist office and provider is on vacation and was told to contact PCP. Told pt will send message to Dr. Elease Hashimoto to see if he will prescribe something for him and get back to him. Pt verbalized understanding.

## 2015-05-03 ENCOUNTER — Ambulatory Visit (INDEPENDENT_AMBULATORY_CARE_PROVIDER_SITE_OTHER): Payer: Medicare HMO | Admitting: Family Medicine

## 2015-05-03 ENCOUNTER — Encounter: Payer: Self-pay | Admitting: Family Medicine

## 2015-05-03 VITALS — BP 128/74 | HR 81 | Temp 98.1°F | Ht 70.0 in | Wt 230.8 lb

## 2015-05-03 DIAGNOSIS — M5384 Other specified dorsopathies, thoracic region: Secondary | ICD-10-CM | POA: Diagnosis not present

## 2015-05-03 DIAGNOSIS — M50122 Cervical disc disorder at C5-C6 level with radiculopathy: Secondary | ICD-10-CM | POA: Diagnosis not present

## 2015-05-03 DIAGNOSIS — M62838 Other muscle spasm: Secondary | ICD-10-CM | POA: Diagnosis not present

## 2015-05-03 DIAGNOSIS — K219 Gastro-esophageal reflux disease without esophagitis: Secondary | ICD-10-CM

## 2015-05-03 DIAGNOSIS — R059 Cough, unspecified: Secondary | ICD-10-CM

## 2015-05-03 DIAGNOSIS — M9902 Segmental and somatic dysfunction of thoracic region: Secondary | ICD-10-CM | POA: Diagnosis not present

## 2015-05-03 DIAGNOSIS — R05 Cough: Secondary | ICD-10-CM

## 2015-05-03 DIAGNOSIS — M50321 Other cervical disc degeneration at C4-C5 level: Secondary | ICD-10-CM | POA: Diagnosis not present

## 2015-05-03 DIAGNOSIS — M9901 Segmental and somatic dysfunction of cervical region: Secondary | ICD-10-CM | POA: Diagnosis not present

## 2015-05-03 DIAGNOSIS — M069 Rheumatoid arthritis, unspecified: Secondary | ICD-10-CM

## 2015-05-03 DIAGNOSIS — M50322 Other cervical disc degeneration at C5-C6 level: Secondary | ICD-10-CM | POA: Diagnosis not present

## 2015-05-03 MED ORDER — BENZONATATE 100 MG PO CAPS: 100.0000 mg | ORAL_CAPSULE | Freq: Three times a day (TID) | ORAL | Status: DC | PRN

## 2015-05-03 NOTE — Progress Notes (Signed)
Pre visit review using our clinic review tool, if applicable. No additional management support is needed unless otherwise documented below in the visit note. 

## 2015-05-03 NOTE — Progress Notes (Signed)
HPI:  Cough -started: 4 weeks ago - reports saw PCP and had labs and CXR wich were fine -symptoms: tickle in throat that causes cough - sometimes worse after meals - worse after [place] recently -denies:fever, SOB, NVD, tooth pain, sinus pain, wheezing, Orthopnea, CP, dysphagia -he has a hx of "terrible" acid reflux but has been on omeprazole for this and feels has been much better -has tried: cough drops and musinex -sick contacts/travel/risks: denies flu exposure, tick exposure or or Ebola risks -Hx of: has RA on low dose prednisone chronically, methotrexate and humira - reports has been feeling well the last few weeks other then the cough  -reports he will not take abx as does not want to stop humira  ROS: See pertinent positives and negatives per HPI.  Past Medical History  Diagnosis Date  . GERD 07/01/2007  . HYPERLIPIDEMIA 06/29/2007  . 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 PROSTATE SPECIFIC ANTIGEN, ELEVATED 06/29/2007  . SEIZURE DISORDER 07/01/2007  . ED (erectile dysfunction)   . Spinal stenosis of lumbar region 09/22/2014    Past Surgical History  Procedure Laterality Date  . Amputation      right great toe - tramatic age 69    No family history on file.  Social History   Social History  . Marital Status: Single    Spouse Name: N/A  . Number of Children: N/A  . Years of Education: N/A   Social History Main Topics  . Smoking status: Never Smoker   . Smokeless tobacco: Never Used  . Alcohol Use: 2.0 oz/week    4 drink(s) per week  . Drug Use: No  . Sexual Activity: Not Asked   Other Topics Concern  . None   Social History Narrative     Current outpatient prescriptions:  .  acetaminophen (TYLENOL) 500 MG tablet, Take 1,000-1,500 mg by mouth every 6 (six) hours as needed., Disp: , Rfl:  .  Adalimumab (HUMIRA [place]), Inject into the skin. 1 INJECTION EVERY TWO WEEKS, Disp: , Rfl:  .  folic acid (FOLVITE) 1 MG tablet, Take 1 mg by mouth daily., Disp: ,  Rfl: 1 .  methotrexate (RHEUMATREX) 2.5 MG tablet, , Disp: , Rfl: 0 .  Multiple Vitamins-Minerals (CENTRUM SILVER PO), Take by mouth daily.  , Disp: , Rfl:  .  Omega-3 Fatty Acids (FISH OIL) 1000 MG CAPS, Take by mouth daily.  , Disp: , Rfl:  .  omeprazole (PRILOSEC) 20 MG capsule, Take 20 mg by mouth daily.  , Disp: , Rfl:  .  predniSONE (DELTASONE) 5 MG tablet, TAKE 7.5 mg DAILY, Disp: , Rfl: 3 .  traMADol (ULTRAM) 50 MG tablet, Take 50 mg by mouth 3 (three) times daily as needed., Disp: , Rfl: 0 .  benzonatate (TESSALON PERLES) 100 MG capsule, Take 1 capsule (100 mg total) by mouth 3 (three) times daily as needed for cough., Disp: 20 capsule, Rfl: 0  EXAM:  Filed Vitals:   05/03/15 1625  BP: 128/74  Pulse: 81  Temp: 98.1 F (36.7 C)    Body mass index is 33.12 kg/(m^2).  GENERAL: vitals reviewed and listed above, alert, oriented, appears well hydrated and in no acute distress  HEENT: atraumatic, conjunttiva clear, no obvious abnormalities on inspection of external nose and ears, normal appearance of ear canals and TMs, thick yellow nasal congestion R > L, mild post oropharyngeal erythema with PND, no tonsillar edema or exudate, no sinus TTP  NECK: no obvious masses on inspection  LUNGS: clear to auscultation bilaterally, no wheezes, rales or rhonchi, good air movement, breathing comfortably, O2 sats normal  CV: HRRR, no peripheral edema  MS: moves all extremities without noticeable abnormality  PSYCH: pleasant and cooperative, no obvious depression or anxiety  ASSESSMENT AND PLAN:  Discussed the following assessment and plan:  Cough  Gastroesophageal reflux disease without esophagitis  Rheumatoid arthritis involving multiple sites, unspecified rheumatoid factor presence ([place])  -reviewed recent labs and CXR - he seems to be doing better since that visit other then the tickle and cough -suspect PND from viral or allergic URI vs silent reflux contributing to his  cough -he had neg CXR and labs recently - reports this started just before that visit and has not worsened -we discuss and abx for possible bacterial URI given thick nasal congestion on exam - but he is having no symptoms of a bacterial infection and refuses abx and opted to try nasal sprays, tesaslon perles and changing PPI to nexium with close follow up with PCP -of course, we advised to return or notify a doctor immediately if symptoms worsen or persist or new concerns arise.    Patient Instructions  BEFORE YOU LEAVE: -schedule follow up with Dr. Burnice Logan in 1-2 weeks  AFRIN nasal spray twice daily for 3 days then STOP  Stop the omeprazole and do nexium 20mg  daily  Nasal saline  Flonase 2 sprays each nostril daily for 3 weeks  Follow up immediately if worsening, new symptoms, trouble breathing, fevers, sinus pain or other concerns  Tessalon perles (cough medication) as needed per instructions     Colin Benton R.

## 2015-05-03 NOTE — Patient Instructions (Addendum)
BEFORE YOU LEAVE: -schedule follow up with Dr. Burnice Logan in 1-2 weeks  AFRIN nasal spray twice daily for 3 days then STOP  Stop the omeprazole and do nexium 20mg  daily  Nasal saline  Flonase 2 sprays each nostril daily for 3 weeks  Follow up immediately if worsening, new symptoms, trouble breathing, fevers, sinus pain or other concerns  Tessalon perles (cough medication) as needed per instructions

## 2015-05-07 DIAGNOSIS — M5384 Other specified dorsopathies, thoracic region: Secondary | ICD-10-CM | POA: Diagnosis not present

## 2015-05-07 DIAGNOSIS — M62838 Other muscle spasm: Secondary | ICD-10-CM | POA: Diagnosis not present

## 2015-05-07 DIAGNOSIS — M50122 Cervical disc disorder at C5-C6 level with radiculopathy: Secondary | ICD-10-CM | POA: Diagnosis not present

## 2015-05-07 DIAGNOSIS — M50321 Other cervical disc degeneration at C4-C5 level: Secondary | ICD-10-CM | POA: Diagnosis not present

## 2015-05-07 DIAGNOSIS — M9901 Segmental and somatic dysfunction of cervical region: Secondary | ICD-10-CM | POA: Diagnosis not present

## 2015-05-07 DIAGNOSIS — M50322 Other cervical disc degeneration at C5-C6 level: Secondary | ICD-10-CM | POA: Diagnosis not present

## 2015-05-07 DIAGNOSIS — M9902 Segmental and somatic dysfunction of thoracic region: Secondary | ICD-10-CM | POA: Diagnosis not present

## 2015-05-10 DIAGNOSIS — R2241 Localized swelling, mass and lump, right lower limb: Secondary | ICD-10-CM | POA: Diagnosis not present

## 2015-05-11 DIAGNOSIS — M5384 Other specified dorsopathies, thoracic region: Secondary | ICD-10-CM | POA: Diagnosis not present

## 2015-05-11 DIAGNOSIS — M9901 Segmental and somatic dysfunction of cervical region: Secondary | ICD-10-CM | POA: Diagnosis not present

## 2015-05-11 DIAGNOSIS — M50321 Other cervical disc degeneration at C4-C5 level: Secondary | ICD-10-CM | POA: Diagnosis not present

## 2015-05-11 DIAGNOSIS — M62838 Other muscle spasm: Secondary | ICD-10-CM | POA: Diagnosis not present

## 2015-05-11 DIAGNOSIS — M50322 Other cervical disc degeneration at C5-C6 level: Secondary | ICD-10-CM | POA: Diagnosis not present

## 2015-05-11 DIAGNOSIS — M50122 Cervical disc disorder at C5-C6 level with radiculopathy: Secondary | ICD-10-CM | POA: Diagnosis not present

## 2015-05-11 DIAGNOSIS — M9902 Segmental and somatic dysfunction of thoracic region: Secondary | ICD-10-CM | POA: Diagnosis not present

## 2015-05-15 DIAGNOSIS — M50322 Other cervical disc degeneration at C5-C6 level: Secondary | ICD-10-CM | POA: Diagnosis not present

## 2015-05-15 DIAGNOSIS — M9901 Segmental and somatic dysfunction of cervical region: Secondary | ICD-10-CM | POA: Diagnosis not present

## 2015-05-15 DIAGNOSIS — M50122 Cervical disc disorder at C5-C6 level with radiculopathy: Secondary | ICD-10-CM | POA: Diagnosis not present

## 2015-05-15 DIAGNOSIS — M5384 Other specified dorsopathies, thoracic region: Secondary | ICD-10-CM | POA: Diagnosis not present

## 2015-05-15 DIAGNOSIS — M50321 Other cervical disc degeneration at C4-C5 level: Secondary | ICD-10-CM | POA: Diagnosis not present

## 2015-05-15 DIAGNOSIS — M9902 Segmental and somatic dysfunction of thoracic region: Secondary | ICD-10-CM | POA: Diagnosis not present

## 2015-05-15 DIAGNOSIS — M62838 Other muscle spasm: Secondary | ICD-10-CM | POA: Diagnosis not present

## 2015-05-17 ENCOUNTER — Ambulatory Visit: Payer: Medicare HMO | Admitting: Internal Medicine

## 2015-05-17 DIAGNOSIS — M50122 Cervical disc disorder at C5-C6 level with radiculopathy: Secondary | ICD-10-CM | POA: Diagnosis not present

## 2015-05-17 DIAGNOSIS — M50322 Other cervical disc degeneration at C5-C6 level: Secondary | ICD-10-CM | POA: Diagnosis not present

## 2015-05-17 DIAGNOSIS — M5384 Other specified dorsopathies, thoracic region: Secondary | ICD-10-CM | POA: Diagnosis not present

## 2015-05-17 DIAGNOSIS — M50321 Other cervical disc degeneration at C4-C5 level: Secondary | ICD-10-CM | POA: Diagnosis not present

## 2015-05-17 DIAGNOSIS — M9901 Segmental and somatic dysfunction of cervical region: Secondary | ICD-10-CM | POA: Diagnosis not present

## 2015-05-17 DIAGNOSIS — M62838 Other muscle spasm: Secondary | ICD-10-CM | POA: Diagnosis not present

## 2015-05-17 DIAGNOSIS — M9902 Segmental and somatic dysfunction of thoracic region: Secondary | ICD-10-CM | POA: Diagnosis not present

## 2015-05-22 DIAGNOSIS — M50322 Other cervical disc degeneration at C5-C6 level: Secondary | ICD-10-CM | POA: Diagnosis not present

## 2015-05-22 DIAGNOSIS — M5384 Other specified dorsopathies, thoracic region: Secondary | ICD-10-CM | POA: Diagnosis not present

## 2015-05-22 DIAGNOSIS — M9901 Segmental and somatic dysfunction of cervical region: Secondary | ICD-10-CM | POA: Diagnosis not present

## 2015-05-22 DIAGNOSIS — M50122 Cervical disc disorder at C5-C6 level with radiculopathy: Secondary | ICD-10-CM | POA: Diagnosis not present

## 2015-05-22 DIAGNOSIS — M62838 Other muscle spasm: Secondary | ICD-10-CM | POA: Diagnosis not present

## 2015-05-22 DIAGNOSIS — M9902 Segmental and somatic dysfunction of thoracic region: Secondary | ICD-10-CM | POA: Diagnosis not present

## 2015-05-22 DIAGNOSIS — M50321 Other cervical disc degeneration at C4-C5 level: Secondary | ICD-10-CM | POA: Diagnosis not present

## 2015-05-24 DIAGNOSIS — M5384 Other specified dorsopathies, thoracic region: Secondary | ICD-10-CM | POA: Diagnosis not present

## 2015-05-24 DIAGNOSIS — M50122 Cervical disc disorder at C5-C6 level with radiculopathy: Secondary | ICD-10-CM | POA: Diagnosis not present

## 2015-05-24 DIAGNOSIS — M9901 Segmental and somatic dysfunction of cervical region: Secondary | ICD-10-CM | POA: Diagnosis not present

## 2015-05-24 DIAGNOSIS — M50321 Other cervical disc degeneration at C4-C5 level: Secondary | ICD-10-CM | POA: Diagnosis not present

## 2015-05-24 DIAGNOSIS — M62838 Other muscle spasm: Secondary | ICD-10-CM | POA: Diagnosis not present

## 2015-05-24 DIAGNOSIS — M9902 Segmental and somatic dysfunction of thoracic region: Secondary | ICD-10-CM | POA: Diagnosis not present

## 2015-05-24 DIAGNOSIS — M50322 Other cervical disc degeneration at C5-C6 level: Secondary | ICD-10-CM | POA: Diagnosis not present

## 2015-05-28 DIAGNOSIS — M50122 Cervical disc disorder at C5-C6 level with radiculopathy: Secondary | ICD-10-CM | POA: Diagnosis not present

## 2015-05-28 DIAGNOSIS — M9902 Segmental and somatic dysfunction of thoracic region: Secondary | ICD-10-CM | POA: Diagnosis not present

## 2015-05-28 DIAGNOSIS — M50321 Other cervical disc degeneration at C4-C5 level: Secondary | ICD-10-CM | POA: Diagnosis not present

## 2015-05-28 DIAGNOSIS — M62838 Other muscle spasm: Secondary | ICD-10-CM | POA: Diagnosis not present

## 2015-05-28 DIAGNOSIS — M5384 Other specified dorsopathies, thoracic region: Secondary | ICD-10-CM | POA: Diagnosis not present

## 2015-05-28 DIAGNOSIS — M9901 Segmental and somatic dysfunction of cervical region: Secondary | ICD-10-CM | POA: Diagnosis not present

## 2015-05-28 DIAGNOSIS — M50322 Other cervical disc degeneration at C5-C6 level: Secondary | ICD-10-CM | POA: Diagnosis not present

## 2015-05-31 DIAGNOSIS — M50122 Cervical disc disorder at C5-C6 level with radiculopathy: Secondary | ICD-10-CM | POA: Diagnosis not present

## 2015-05-31 DIAGNOSIS — M9901 Segmental and somatic dysfunction of cervical region: Secondary | ICD-10-CM | POA: Diagnosis not present

## 2015-05-31 DIAGNOSIS — M50322 Other cervical disc degeneration at C5-C6 level: Secondary | ICD-10-CM | POA: Diagnosis not present

## 2015-05-31 DIAGNOSIS — M9902 Segmental and somatic dysfunction of thoracic region: Secondary | ICD-10-CM | POA: Diagnosis not present

## 2015-05-31 DIAGNOSIS — M62838 Other muscle spasm: Secondary | ICD-10-CM | POA: Diagnosis not present

## 2015-05-31 DIAGNOSIS — M5384 Other specified dorsopathies, thoracic region: Secondary | ICD-10-CM | POA: Diagnosis not present

## 2015-05-31 DIAGNOSIS — M50321 Other cervical disc degeneration at C4-C5 level: Secondary | ICD-10-CM | POA: Diagnosis not present

## 2015-06-04 DIAGNOSIS — M50322 Other cervical disc degeneration at C5-C6 level: Secondary | ICD-10-CM | POA: Diagnosis not present

## 2015-06-04 DIAGNOSIS — M62838 Other muscle spasm: Secondary | ICD-10-CM | POA: Diagnosis not present

## 2015-06-04 DIAGNOSIS — M9901 Segmental and somatic dysfunction of cervical region: Secondary | ICD-10-CM | POA: Diagnosis not present

## 2015-06-04 DIAGNOSIS — M50122 Cervical disc disorder at C5-C6 level with radiculopathy: Secondary | ICD-10-CM | POA: Diagnosis not present

## 2015-06-04 DIAGNOSIS — M50321 Other cervical disc degeneration at C4-C5 level: Secondary | ICD-10-CM | POA: Diagnosis not present

## 2015-06-04 DIAGNOSIS — M9902 Segmental and somatic dysfunction of thoracic region: Secondary | ICD-10-CM | POA: Diagnosis not present

## 2015-06-04 DIAGNOSIS — M5384 Other specified dorsopathies, thoracic region: Secondary | ICD-10-CM | POA: Diagnosis not present

## 2015-06-11 DIAGNOSIS — Z89411 Acquired absence of right great toe: Secondary | ICD-10-CM | POA: Diagnosis not present

## 2015-06-11 DIAGNOSIS — R2241 Localized swelling, mass and lump, right lower limb: Secondary | ICD-10-CM | POA: Diagnosis not present

## 2015-06-12 DIAGNOSIS — Z01812 Encounter for preprocedural laboratory examination: Secondary | ICD-10-CM | POA: Diagnosis not present

## 2015-06-20 DIAGNOSIS — R2241 Localized swelling, mass and lump, right lower limb: Secondary | ICD-10-CM | POA: Diagnosis not present

## 2015-06-27 DIAGNOSIS — M7741 Metatarsalgia, right foot: Secondary | ICD-10-CM | POA: Diagnosis not present

## 2015-06-27 DIAGNOSIS — Z89411 Acquired absence of right great toe: Secondary | ICD-10-CM | POA: Diagnosis not present

## 2015-06-27 DIAGNOSIS — M205X1 Other deformities of toe(s) (acquired), right foot: Secondary | ICD-10-CM | POA: Diagnosis not present

## 2015-07-14 ENCOUNTER — Other Ambulatory Visit: Payer: Self-pay | Admitting: Internal Medicine

## 2015-07-20 DIAGNOSIS — M2550 Pain in unspecified joint: Secondary | ICD-10-CM | POA: Diagnosis not present

## 2015-07-20 DIAGNOSIS — Z79899 Other long term (current) drug therapy: Secondary | ICD-10-CM | POA: Diagnosis not present

## 2015-07-20 DIAGNOSIS — M0579 Rheumatoid arthritis with rheumatoid factor of multiple sites without organ or systems involvement: Secondary | ICD-10-CM | POA: Diagnosis not present

## 2015-07-24 DIAGNOSIS — M50322 Other cervical disc degeneration at C5-C6 level: Secondary | ICD-10-CM | POA: Diagnosis not present

## 2015-07-24 DIAGNOSIS — M9902 Segmental and somatic dysfunction of thoracic region: Secondary | ICD-10-CM | POA: Diagnosis not present

## 2015-07-24 DIAGNOSIS — M62838 Other muscle spasm: Secondary | ICD-10-CM | POA: Diagnosis not present

## 2015-07-24 DIAGNOSIS — M5384 Other specified dorsopathies, thoracic region: Secondary | ICD-10-CM | POA: Diagnosis not present

## 2015-07-24 DIAGNOSIS — M50321 Other cervical disc degeneration at C4-C5 level: Secondary | ICD-10-CM | POA: Diagnosis not present

## 2015-07-24 DIAGNOSIS — M50122 Cervical disc disorder at C5-C6 level with radiculopathy: Secondary | ICD-10-CM | POA: Diagnosis not present

## 2015-07-24 DIAGNOSIS — M9901 Segmental and somatic dysfunction of cervical region: Secondary | ICD-10-CM | POA: Diagnosis not present

## 2015-09-27 DIAGNOSIS — Z79899 Other long term (current) drug therapy: Secondary | ICD-10-CM | POA: Diagnosis not present

## 2015-09-27 DIAGNOSIS — M2550 Pain in unspecified joint: Secondary | ICD-10-CM | POA: Diagnosis not present

## 2015-09-27 DIAGNOSIS — M0579 Rheumatoid arthritis with rheumatoid factor of multiple sites without organ or systems involvement: Secondary | ICD-10-CM | POA: Diagnosis not present

## 2015-10-24 DIAGNOSIS — R5383 Other fatigue: Secondary | ICD-10-CM | POA: Diagnosis not present

## 2015-10-24 DIAGNOSIS — T148 Other injury of unspecified body region: Secondary | ICD-10-CM | POA: Diagnosis not present

## 2015-10-24 DIAGNOSIS — M0579 Rheumatoid arthritis with rheumatoid factor of multiple sites without organ or systems involvement: Secondary | ICD-10-CM | POA: Diagnosis not present

## 2015-11-12 ENCOUNTER — Ambulatory Visit (INDEPENDENT_AMBULATORY_CARE_PROVIDER_SITE_OTHER): Payer: Medicare HMO | Admitting: Internal Medicine

## 2015-11-12 ENCOUNTER — Encounter: Payer: Self-pay | Admitting: Internal Medicine

## 2015-11-12 VITALS — BP 128/72 | HR 89 | Temp 98.0°F | Ht 70.0 in | Wt 223.0 lb

## 2015-11-12 DIAGNOSIS — R5382 Chronic fatigue, unspecified: Secondary | ICD-10-CM

## 2015-11-12 DIAGNOSIS — M069 Rheumatoid arthritis, unspecified: Secondary | ICD-10-CM

## 2015-11-12 NOTE — Patient Instructions (Signed)
Call or return to clinic prn if these symptoms worsen or fail to improve as anticipated.  Report any new or worsening symptoms  Otherwise, return for your annual exam in 6 months

## 2015-11-12 NOTE — Progress Notes (Signed)
Pre visit review using our clinic review tool, if applicable. No additional management support is needed unless otherwise documented below in the visit note. 

## 2015-11-12 NOTE — Progress Notes (Signed)
Subjective:    Patient ID: Samuel Moran, male    DOB: [birth year], 69 y.o.   MRN: [id number]  HPI  70 year old patient who has a history of RA.  He is followed accordingly by rheumatology. He continues to have chronic fatigue.  This was his complaint 7 months ago.  Sedimentation rate at that time was normal as was general.  Laboratory screen. At the present time.  He is on methotrexate only and Humira has been discontinued.  Prednisone has been down titrated to 2.5 milligrams daily. There is been some modest weight loss due to decreased caloric intake.  He states at times he has been too fatigued to cook a meal and often eats salads or soup He remains active with his exercise regimen including weight training and yoga  Past Medical History  Diagnosis Date  . GERD 07/01/2007  . HYPERLIPIDEMIA 06/29/2007  . 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 PROSTATE SPECIFIC ANTIGEN, ELEVATED 06/29/2007  . SEIZURE DISORDER 07/01/2007  . ED (erectile dysfunction)   . Spinal stenosis of lumbar region 09/22/2014     Social History   Social History  . Marital Status: Single    Spouse Name: N/A  . Number of Children: N/A  . Years of Education: N/A   Occupational History  . Not on file.   Social History Main Topics  . Smoking status: Never Smoker   . Smokeless tobacco: Never Used  . Alcohol Use: 2.0 oz/week    4 drink(s) per week  . Drug Use: No  . Sexual Activity: Not on file   Other Topics Concern  . Not on file   Social History Narrative    Past Surgical History  Procedure Laterality Date  . Amputation      right great toe - tramatic age 52    No family history on file.  No Known Allergies  Current Outpatient Prescriptions on File Prior to Visit  Medication Sig Dispense Refill  . acetaminophen (TYLENOL) 500 MG tablet Take 1,000-1,500 mg by mouth every 6 (six) hours as needed.    . Adalimumab (HUMIRA [place]) Inject into the skin. 1 INJECTION EVERY TWO WEEKS    . folic acid  (FOLVITE) 1 MG tablet Take 1 mg by mouth daily.  1  . methotrexate (RHEUMATREX) 2.5 MG tablet   0  . Multiple Vitamins-Minerals (CENTRUM SILVER PO) Take by mouth daily.      . Omega-3 Fatty Acids (FISH OIL) 1000 MG CAPS Take by mouth daily.      Marland Kitchen omeprazole (PRILOSEC) 20 MG capsule Take 20 mg by mouth daily.      . traMADol (ULTRAM) 50 MG tablet Take 50 mg by mouth 3 (three) times daily as needed.  0   No current facility-administered medications on file prior to visit.    BP 128/72 mmHg  Pulse 89  Temp(Src) 98 F (36.7 C) (Oral)  Ht 5\' 10"  (1.778 m)  Wt 223 lb (101.152 kg)  BMI 32.00 kg/m2  SpO2 98%     Review of Systems  Constitutional: Positive for fatigue. Negative for fever, chills and appetite change.  HENT: Negative for congestion, dental problem, ear pain, hearing loss, sore throat, tinnitus, trouble swallowing and voice change.   Eyes: Negative for pain, discharge and visual disturbance.  Respiratory: Negative for cough, chest tightness, wheezing and stridor.   Cardiovascular: Negative for chest pain, palpitations and leg swelling.  Gastrointestinal: Negative for nausea, vomiting, abdominal pain, diarrhea, constipation, blood in stool and abdominal distention.  Genitourinary: Negative for urgency, hematuria, flank pain, discharge, difficulty urinating and genital sores.  Musculoskeletal: Negative for myalgias, back pain, joint swelling, arthralgias, gait problem and neck stiffness.  Skin: Negative for rash.  Neurological: Negative for dizziness, syncope, speech difficulty, weakness, numbness and headaches.  Hematological: Negative for adenopathy. Does not bruise/bleed easily.  Psychiatric/Behavioral: Negative for behavioral problems and dysphoric mood. The patient is not nervous/anxious.        Objective:   Physical Exam  Constitutional: He is oriented to person, place, and time. He appears well-developed.  HENT:  Head: Normocephalic.  Right Ear: External ear  normal.  Left Ear: External ear normal.  Eyes: Conjunctivae and EOM are normal.  Neck: Normal range of motion.  Cardiovascular: Normal rate and normal heart sounds.   Pulmonary/Chest: Breath sounds normal.  Abdominal: Bowel sounds are normal.  Musculoskeletal: Normal range of motion. He exhibits no edema or tenderness.  Neurological: He is alert and oriented to person, place, and time.  Psychiatric: He has a normal mood and affect. His behavior is normal.          Assessment & Plan:   Rheumatoid arthritis Fatigue.  Probably secondary to #1 Seizure disorder stable   Will report any new or worsening symptoms.  Otherwise, return for his annual exam

## 2015-11-13 ENCOUNTER — Telehealth: Payer: Self-pay | Admitting: Internal Medicine

## 2015-11-13 MED ORDER — VITAMIN D3 1.25 MG (50000 UT) PO TABS: 1.0000 | ORAL_TABLET | ORAL | Status: DC

## 2015-11-13 NOTE — Telephone Encounter (Signed)
Pt would like to know if you are ok with prescribing Vitamin D 3 for his fatique? Pt has a friend who was prescribed 3 x week, a heavy dose pack and that gave good results. If so, pt would llike a rx called in to  [facility]

## 2015-11-13 NOTE — Telephone Encounter (Signed)
Vitamin D 50,000 unit tablet, #12, one weekly

## 2015-11-13 NOTE — Telephone Encounter (Signed)
Please see message and advise 

## 2015-11-13 NOTE — Telephone Encounter (Signed)
Left detailed message on personal voicemail Rx for Vit D 50,000 units one tablet weekly x 12 weeks. Any questions please call office. Rx sent to pharmacy.

## 2015-11-22 DIAGNOSIS — Z79899 Other long term (current) drug therapy: Secondary | ICD-10-CM | POA: Diagnosis not present

## 2016-01-02 DIAGNOSIS — Z79899 Other long term (current) drug therapy: Secondary | ICD-10-CM | POA: Diagnosis not present

## 2016-01-02 DIAGNOSIS — M0579 Rheumatoid arthritis with rheumatoid factor of multiple sites without organ or systems involvement: Secondary | ICD-10-CM | POA: Diagnosis not present

## 2016-01-02 DIAGNOSIS — R109 Unspecified abdominal pain: Secondary | ICD-10-CM | POA: Diagnosis not present

## 2016-01-02 DIAGNOSIS — M2550 Pain in unspecified joint: Secondary | ICD-10-CM | POA: Diagnosis not present

## 2016-01-28 ENCOUNTER — Encounter: Payer: Self-pay | Admitting: Internal Medicine

## 2016-01-28 ENCOUNTER — Ambulatory Visit (INDEPENDENT_AMBULATORY_CARE_PROVIDER_SITE_OTHER): Payer: Medicare HMO | Admitting: Internal Medicine

## 2016-01-28 VITALS — BP 136/90 | HR 76 | Temp 98.4°F | Resp 20 | Ht 70.0 in | Wt 221.4 lb

## 2016-01-28 DIAGNOSIS — R258 Other abnormal involuntary movements: Secondary | ICD-10-CM | POA: Diagnosis not present

## 2016-01-28 DIAGNOSIS — M459 Ankylosing spondylitis of unspecified sites in spine: Secondary | ICD-10-CM | POA: Diagnosis not present

## 2016-01-28 DIAGNOSIS — E785 Hyperlipidemia, unspecified: Secondary | ICD-10-CM

## 2016-01-28 DIAGNOSIS — R5383 Other fatigue: Secondary | ICD-10-CM

## 2016-01-28 NOTE — Patient Instructions (Signed)
Schedule your colonoscopy to help detect colon cancer.  Neurology consultation as discussed  Annual exam.  3 months  Rheumatology follow-up as scheduled

## 2016-01-28 NOTE — Progress Notes (Signed)
Subjective:    Patient ID: Samuel Moran, male    DOB: [birth year], 70 y.o.   MRN: [id number]  HPI  70 year old patient who is followed by rheumatology for RA.  He presents with a several month history of fatigue, dizziness and general sense of unwellness.  His fatigue initially felt secondary to either RA or treatment of RA. For the past 6 months, he has noted an intermittent tremor involving the right hand. At times he feels quite unsteady and feels off balance.   He intermittently drags his feet.  He now has a difficult time using his right hand to button shirts.  He gives a history consistent with micrographia.  He states that he feels that he has slowed down and has become more tired and weak.   No family history of PD.   Past Medical History:  Diagnosis Date  . ED (erectile dysfunction)   . GERD 07/01/2007  . HYPERLIPIDEMIA 06/29/2007  . 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 PROSTATE SPECIFIC ANTIGEN, ELEVATED 06/29/2007  . SEIZURE DISORDER 07/01/2007  . Spinal stenosis of lumbar region 09/22/2014     Social History   Social History  . Marital status: Single    Spouse name: N/A  . Number of children: N/A  . Years of education: N/A   Occupational History  . Not on file.   Social History Main Topics  . Smoking status: Never Smoker  . Smokeless tobacco: Never Used  . Alcohol use 2.0 oz/week    4 drink(s) per week  . Drug use: No  . Sexual activity: Not on file   Other Topics Concern  . Not on file   Social History Narrative  . No narrative on file    Past Surgical History:  Procedure Laterality Date  . AMPUTATION     right great toe - tramatic age 19    No family history on file.  No Known Allergies  Current Outpatient Prescriptions on File Prior to Visit  Medication Sig Dispense Refill  . acetaminophen (TYLENOL) 500 MG tablet Take 1,000-1,500 mg by mouth every 6 (six) hours as needed.    . Cholecalciferol (VITAMIN D3) 50000 units TABS Take 1 tablet by  mouth once a week. X 12 weeks. 12 tablet 0  . methotrexate (RHEUMATREX) 2.5 MG tablet   0  . Multiple Vitamins-Minerals (CENTRUM SILVER PO) Take by mouth daily.      Marland Kitchen omeprazole (PRILOSEC) 20 MG capsule Take 20 mg by mouth daily.       No current facility-administered medications on file prior to visit.     BP 136/90 (BP Location: Left Arm, Patient Position: Sitting, Cuff Size: Normal)   Pulse 76   Temp 98.4 F (36.9 C) (Oral)   Resp 20   Ht 5\' 10"  (1.778 m)   Wt 221 lb 6.1 oz (100.4 kg)   SpO2 99%   BMI 31.76 kg/m    Review of Systems  Constitutional: Positive for activity change and fatigue. Negative for appetite change, chills and fever.  HENT: Negative for congestion, dental problem, ear pain, hearing loss, sore throat, tinnitus, trouble swallowing and voice change.   Eyes: Negative for pain, discharge and visual disturbance.  Respiratory: Negative for cough, chest tightness, wheezing and stridor.   Cardiovascular: Negative for chest pain, palpitations and leg swelling.  Gastrointestinal: Negative for abdominal distention, abdominal pain, blood in stool, constipation, diarrhea, nausea and vomiting.  Genitourinary: Negative for difficulty urinating, discharge, flank pain, genital sores, hematuria and urgency.  Musculoskeletal: Positive for gait problem. Negative for arthralgias, back pain, joint swelling, myalgias and neck stiffness.  Skin: Negative for rash.  Neurological: Positive for tremors and weakness. Negative for dizziness, syncope, speech difficulty, numbness and headaches.  Hematological: Negative for adenopathy. Does not bruise/bleed easily.  Psychiatric/Behavioral: Negative for behavioral problems and dysphoric mood. The patient is not nervous/anxious.        Objective:   Physical Exam  Constitutional: He is oriented to person, place, and time. He appears well-developed.  HENT:  Head: Normocephalic.  Right Ear: External ear normal.  Left Ear: External ear  normal.  Eyes: Conjunctivae and EOM are normal.  Neck: Normal range of motion.  Cardiovascular: Normal rate and normal heart sounds.   Pulmonary/Chest: Breath sounds normal.  Abdominal: Bowel sounds are normal.  Musculoskeletal: Normal range of motion. He exhibits no edema or tenderness.  Neurological: He is alert and oriented to person, place, and time.  No obvious tremor of the right hand Rapid alternating movements performed fairly well Gait a bit slow Normal finger to nose testing  Psychiatric: He has a normal mood and affect. His behavior is normal.          Assessment & Plan:  Rule out Parkinson's disease.  Patient has a 6 month history of an intermittent tremor involving the right hand, although not obvious today.  He has a several month history of weakness and tiredness and now complains of incoordination and decreased manual dexterity, shuffling of the feet, all consistent with bradykinesia.  Will schedule a neurology consultation Rheumatoid arthritis.  Follow-up rheumatology History of a seizure disorder  Samuel Moran

## 2016-01-28 NOTE — Progress Notes (Signed)
Pre visit review using our clinic review tool, if applicable. No additional management support is needed unless otherwise documented below in the visit note. 
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
## 2016-03-05 ENCOUNTER — Ambulatory Visit (INDEPENDENT_AMBULATORY_CARE_PROVIDER_SITE_OTHER): Payer: Medicare HMO | Admitting: Neurology

## 2016-03-05 ENCOUNTER — Encounter: Payer: Self-pay | Admitting: Neurology

## 2016-03-05 VITALS — BP 130/70 | HR 76 | Ht 71.0 in | Wt 222.0 lb

## 2016-03-05 DIAGNOSIS — G20A1 Parkinson's disease without dyskinesia, without mention of fluctuations: Secondary | ICD-10-CM

## 2016-03-05 DIAGNOSIS — G20 Parkinson's disease: Secondary | ICD-10-CM | POA: Diagnosis not present

## 2016-03-05 DIAGNOSIS — R251 Tremor, unspecified: Secondary | ICD-10-CM | POA: Diagnosis not present

## 2016-03-05 NOTE — Patient Instructions (Signed)
1. We have sent a referral to [facility] for your MRI and they will call you directly to schedule your appt. They are located at [address] If you need to contact them directly please call [phone].   

## 2016-03-06 ENCOUNTER — Other Ambulatory Visit: Payer: Medicare HMO

## 2016-03-09 DIAGNOSIS — R69 Illness, unspecified: Secondary | ICD-10-CM | POA: Diagnosis not present

## 2016-03-10 ENCOUNTER — Telehealth: Payer: Self-pay | Admitting: Neurology

## 2016-03-10 NOTE — Telephone Encounter (Signed)
Patient made aware.   He has decided he wants to go ahead and start medication.   Pramipexole was discussed at his visit. Would you like me to send to his pharmacy?

## 2016-03-10 NOTE — Telephone Encounter (Signed)
Insurance denied MRI without atypical features on examination.  Said that they would not approve for PD.  No use in calling for peer to peer as I have nothing further to offer as he doesn't have atypical features.  Please let pt know that his insurance won't approve for tremor or PD

## 2016-03-10 NOTE — Telephone Encounter (Signed)
Sure with f/u in 8-12 weeks.  Not sure that insurance will pay for the qday med but can try if that is what he wants

## 2016-03-10 NOTE — Telephone Encounter (Signed)
Left message on machine for patient to call back.

## 2016-03-11 NOTE — Telephone Encounter (Signed)
Patient wants to come in to discuss medications before starting. Appt made.

## 2016-03-11 NOTE — Telephone Encounter (Signed)
Left message on machine for patient to call back if he wants me to send in medication.

## 2016-03-14 ENCOUNTER — Telehealth: Payer: Self-pay

## 2016-03-14 ENCOUNTER — Telehealth: Payer: Self-pay | Admitting: Neurology

## 2016-03-14 ENCOUNTER — Ambulatory Visit (INDEPENDENT_AMBULATORY_CARE_PROVIDER_SITE_OTHER): Payer: Medicare HMO | Admitting: Neurology

## 2016-03-14 ENCOUNTER — Encounter: Payer: Self-pay | Admitting: Neurology

## 2016-03-14 VITALS — BP 128/70 | HR 72 | Ht 71.0 in | Wt 220.0 lb

## 2016-03-14 DIAGNOSIS — G20 Parkinson's disease: Secondary | ICD-10-CM

## 2016-03-14 MED ORDER — PRAMIPEXOLE DIHYDROCHLORIDE 0.5 MG PO TABS: 0.5000 mg | ORAL_TABLET | Freq: Three times a day (TID) | ORAL | 1 refills | Status: DC

## 2016-03-14 MED ORDER — PRAMIPEXOLE DIHYDROCHLORIDE 0.125 MG PO TABS: ORAL_TABLET | ORAL | 0 refills | Status: DC

## 2016-03-14 NOTE — Telephone Encounter (Signed)
Made patient aware if tremor is the problem that it may benefit, but it probably won't help the small handwriting. He expressed understanding.

## 2016-03-14 NOTE — Telephone Encounter (Signed)
Samuel Moran [facility] 09-Sep-2045. He wanted to know if his hand writing would improve with the new medication? His # is [id number]. Thank you

## 2016-03-14 NOTE — Telephone Encounter (Signed)
Barbara from [facility] called. Medical director has denied request for MRI brain. MRI brain only approved for parkinson's if pt has atypical symptoms for parkinson's. MRI will need to be appealed via [employer]. They will fax letter about denial and instructions for appeal.   Pamala Hurry - [id number] Case ID# [id number]
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
## 2016-03-14 NOTE — Patient Instructions (Signed)
Start mirapex (pramipexole) as follows:  0.125 mg - 1 tablet three times per day for a week, then 2 tablets three times per day for a week and then fill the 0.5 mg tablet and take that, 1 pill three times per day   

## 2016-03-16 ENCOUNTER — Other Ambulatory Visit: Payer: Medicare HMO

## 2016-03-31 ENCOUNTER — Telehealth: Payer: Self-pay | Admitting: Neurology

## 2016-03-31 NOTE — Telephone Encounter (Signed)
Pt is speaking about Mirapex. He missed a dose Please advise.

## 2016-03-31 NOTE — Telephone Encounter (Signed)
PT called and said he forgot to take a pill and wants to know if he can double up/Dawn CB# [phone]

## 2016-03-31 NOTE — Telephone Encounter (Signed)
Advised pt that Dr. Carles Collet had already left for the afternoon and suggested he contact pharmacist for recommendation.

## 2016-04-01 NOTE — Telephone Encounter (Signed)
Patient doing well. He did not double up on medication. Will call with any problems.

## 2016-04-01 NOTE — Telephone Encounter (Signed)
Call patient and see how doing this AM.  He didn't need to double up but wouldn't have hurt him if he did.

## 2016-04-03 DIAGNOSIS — M2550 Pain in unspecified joint: Secondary | ICD-10-CM | POA: Diagnosis not present

## 2016-04-03 DIAGNOSIS — M0579 Rheumatoid arthritis with rheumatoid factor of multiple sites without organ or systems involvement: Secondary | ICD-10-CM | POA: Diagnosis not present

## 2016-04-03 DIAGNOSIS — Z79899 Other long term (current) drug therapy: Secondary | ICD-10-CM | POA: Diagnosis not present

## 2016-04-03 DIAGNOSIS — Z6830 Body mass index (BMI) 30.0-30.9, adult: Secondary | ICD-10-CM | POA: Diagnosis not present

## 2016-04-03 DIAGNOSIS — E669 Obesity, unspecified: Secondary | ICD-10-CM | POA: Diagnosis not present

## 2016-04-09 DIAGNOSIS — H524 Presbyopia: Secondary | ICD-10-CM | POA: Diagnosis not present

## 2016-04-09 DIAGNOSIS — H52223 Regular astigmatism, bilateral: Secondary | ICD-10-CM | POA: Diagnosis not present

## 2016-06-09 ENCOUNTER — Telehealth: Payer: Self-pay | Admitting: Neurology

## 2016-06-09 NOTE — Telephone Encounter (Signed)
Patient wants to talk to someone about medication side effects. Patient states he is not having any side effects at this time please call [phone]

## 2016-06-09 NOTE — Telephone Encounter (Signed)
I discussed that SE with him last time at length.  It is not common but well reported (and documented in my notes).  Every med has potential side effects and right now I think that this is probably the best one for him, since he is side effect free.
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
## 2016-06-09 NOTE — Telephone Encounter (Signed)
Left message on machine for patient to call back.

## 2016-06-10 NOTE — Telephone Encounter (Signed)
Patient made aware. He will stay on medication for now.

## 2016-06-10 NOTE — Telephone Encounter (Signed)
Left message on machine for patient to call back.

## 2016-06-23 ENCOUNTER — Telehealth: Payer: Self-pay | Admitting: Neurology

## 2016-06-23 NOTE — Telephone Encounter (Signed)
He can just d/c the medication then.  Looks like based on 06/09/16 phone call that he felt he might get this SE anyway based on fact that his friends brother in law had it.  We can discuss in person next steps at next visit since has been almost 3 months since I have seen him

## 2016-06-23 NOTE — Telephone Encounter (Signed)
Patient made aware. He will stop medication and keep follow up appt to discuss.

## 2016-06-23 NOTE — Telephone Encounter (Signed)
Patient called and left  A message stating that he was having a reaction to the medication he was taking such a swelling of the hand and feet and falling a sleep please call him at [phone]

## 2016-06-23 NOTE — Telephone Encounter (Signed)
Spoke with patient. He states side effects started about a month ago. He is having swelling in hands and feet. He is also feeling tired all the time. He states only sleeping about 4 hours at a time. He can be sitting watching TV and falls asleep quickly. He has not fallen asleep while driving, etc. Always when relaxing. He states when he takes Pramipexole with his RA medication (leflunomide) he feels "spacey". Please advise.

## 2016-06-30 DIAGNOSIS — Z79899 Other long term (current) drug therapy: Secondary | ICD-10-CM | POA: Diagnosis not present

## 2016-06-30 DIAGNOSIS — M2550 Pain in unspecified joint: Secondary | ICD-10-CM | POA: Diagnosis not present

## 2016-06-30 DIAGNOSIS — Z6830 Body mass index (BMI) 30.0-30.9, adult: Secondary | ICD-10-CM | POA: Diagnosis not present

## 2016-06-30 DIAGNOSIS — M0579 Rheumatoid arthritis with rheumatoid factor of multiple sites without organ or systems involvement: Secondary | ICD-10-CM | POA: Diagnosis not present

## 2016-06-30 DIAGNOSIS — E669 Obesity, unspecified: Secondary | ICD-10-CM | POA: Diagnosis not present

## 2016-07-01 NOTE — Progress Notes (Signed)
Samuel Moran was seen today in the movement disorders clinic for neurologic consultation at the request of Nyoka Cowden, MD.  The consultation is for the evaluation of R hand tremor and gait change x 6 months.  Tremor is noted at rest.  He is R hand dominant.  Tremor is gone when he picks up arm or makes a fist.  Pt states that he takes methotrexate and he noted that after he takes it he will shuffle for a few days and not swing the arms.  Dr. [name], therefore, took him off of the medication 3 weeks ago and he feels that walking is a bit better.  The records that were made available to me were reviewed.  Does report hx of seizure in 2008 and was at self checkout and couldn't figure out how to do steps.  Got finished, walked to car and next thing he remembers is being in ambulance.  Apparently, got in car and had death grip on wheel.  EMS got there and he was confused and wouldn't unlock door.  They broke window and took him to hospital.  W/U negative except "white spot" on brain.  03/14/16 update:  Pt returns for f/u much earlier than expected.  Has dx of PD, and last visit he didn't want to start medication.  He decided almost immediately after the visit that he wanted to start medication but he wanted to come back and discuss and discuss the medication in more detail.  Has a myriad of questions today.  Insurance company denied authorization for his MRI brain.  07/03/16 update:  Patient follows up today.  I started him on pramipexole last visit.  He called me in February to state that he had no side effects with medication, but his friends brother-in-law had a sleep attack on the medication and he was concerned about the potential side effect.  We had discussed this potential side effect previously.  Since he was side effect free, I told him that I would recommend that he continue on the medication.  2 weeks later he called to tell me that he was sleeping all of the time on the medication.    Today he states that it was really sleepiness but "spaciness" and "I wasn't all there."   I told him just to discontinue the medication and we could talk about it at follow-up.  I did get a note from his rheumatologist, Dr. [name], dated 06/30/2016 that stated that the patient had an acute rheumatoid arthritis flareup.  She thought that it was because he discontinued his methotrexate.  The patient did that because he thought methotrexate was causing dizziness, but she did not think that was the case and thought it was the fact that he was taking a different version of folic acid.  She restarted methotrexate and folic acid.  Thinks that he is moving around okay short of the RA flare.  He is riding the bike for 10 min 3 times a week.  No falls.  No lightheadedness or near syncope.  Mood has been okay.    The only neuroimaging that has been done is a CT head back in 2009 which was unremarkable.  PREVIOUS MEDICATIONS: Pramipexole (patient had a friend who had a sleep attack on the medication and subsequently the patient developed sleepiness on the medication and we decided to discontinue)  ALLERGIES:  No Known Allergies  CURRENT MEDICATIONS:  Outpatient Encounter Prescriptions as of 07/03/2016  Medication Sig  . folic  acid (FOLVITE) 1 MG tablet Take 1 mg by mouth daily.  Marland Kitchen ibuprofen (ADVIL,MOTRIN) 800 MG tablet Take 800 mg by mouth as needed.  . leflunomide (ARAVA) 20 MG tablet Take 20 mg by mouth daily.   . methotrexate (RHEUMATREX) 2.5 MG tablet Take 20 mg by mouth once a week. Caution:Chemotherapy. Protect from light.  . Multiple Vitamins-Minerals (CENTRUM SILVER PO) Take by mouth daily.    . Omega-3 Fatty Acids (FISH OIL) 1000 MG CAPS Take by mouth.  Marland Kitchen omeprazole (PRILOSEC) 20 MG capsule Take 20 mg by mouth daily.    . [DISCONTINUED] acetaminophen (TYLENOL) 500 MG tablet Take 1,000-1,500 mg by mouth every 6 (six) hours as needed.  . [DISCONTINUED] pramipexole (MIRAPEX) 0.125 MG tablet 1 tablet  three times per day for a week, then 2 po three times per day for a week  . [DISCONTINUED] pramipexole (MIRAPEX) 0.5 MG tablet Take 1 tablet (0.5 mg total) by mouth 3 (three) times daily.   No facility-administered encounter medications on file as of 07/03/2016.     PAST MEDICAL HISTORY:   Past Medical History:  Diagnosis Date  . ED (erectile dysfunction)   . GERD 07/01/2007  . 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 PROSTATE SPECIFIC ANTIGEN, ELEVATED 06/29/2007  . Rheumatoid arthritis ([place])   . SEIZURE DISORDER 07/01/2007   one epsiode only  . Spinal stenosis of lumbar region 09/22/2014    PAST SURGICAL HISTORY:   Past Surgical History:  Procedure Laterality Date  . AMPUTATION     right great toe - tramatic age 54    SOCIAL HISTORY:   Social History   Social History  . Marital status: Single    Spouse name: N/A  . Number of children: N/A  . Years of education: N/A   Occupational History  . Chartered certified accountant    Social History Main Topics  . Smoking status: Light Tobacco Smoker    Types: Cigars  . Smokeless tobacco: Never Used     Comment: 1 cigar a week  . Alcohol use 2.0 oz/week    4 Standard drinks or equivalent per week     Comment: one drink a day (liquor)  . Drug use: No  . Sexual activity: Not on file   Other Topics Concern  . Not on file   Social History Narrative  . No narrative on file    FAMILY HISTORY:   Family Status  Relation Status  . Mother Deceased  . Father Deceased  . Child Alive    ROS:  LBP on the right.  A complete 10 system review of systems was obtained and was unremarkable apart from what is mentioned above.  PHYSICAL EXAMINATION:    VITALS:   Vitals:   07/03/16 0919  BP: 126/82  Pulse: 78  Weight: 217 lb (98.4 kg)  Height: 5' 11"  (1.803 m)    GEN:  The patient appears stated age and is in NAD. HEENT:  Normocephalic, atraumatic.  The mucous membranes are moist. The superficial temporal arteries are without ropiness or  tenderness. CV:  RRR Lungs:  CTAB Neck/HEME:  There are no carotid bruits bilaterally.  Neurological examination:  Orientation: The patient is alert and oriented x3.  Cranial nerves: There is good facial symmetry. There is facial hypomimia. The visual fields are full to confrontational testing. The speech is fluent and clear.  He is hypophonic.  Soft palate rises symmetrically and there is no tongue deviation. Hearing is intact to conversational tone. Sensation: Sensation is intact to  light touch throughout. Motor: Strength is 5/5 in the bilateral upper and lower extremities.   Shoulder shrug is equal and symmetric.  There is no pronator drift.  Movement examination: Tone: There is mild increased tone in the RUE.  It is increased with activation on the right only.  There is normal tone elsewhere Abnormal movements: Rare tremor in the RUE that is mostly felt (as opposed to seen) with distraction procedures.  Coordination:  There is decremation with RAM's, with hand opening and closing on the right and some with finger taps on the L and toe taps on the left.   Gait and Station: The patient has minimal difficulty arising out of a deep-seated chair without the use of the hands. The patient's stride length is normal with decreased arm swing bilaterally.  The patient has a negative pull test.      LABS    Chemistry      Component Value Date/Time   NA 139 04/17/2015 1607   K 4.0 04/17/2015 1607   CL 104 04/17/2015 1607   CO2 27 04/17/2015 1607   BUN 21 04/17/2015 1607   CREATININE 1.22 04/17/2015 1607      Component Value Date/Time   CALCIUM 8.8 04/17/2015 1607   ALKPHOS 45 04/17/2015 1607   AST 22 04/17/2015 1607   ALT 22 04/17/2015 1607   BILITOT 0.7 04/17/2015 1607     Lab Results  Component Value Date   TSH 1.15 04/17/2015   No results found for: VITAMINB12   ASSESSMENT/PLAN:  1.  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  We discussed treatment options as well as prognostic indicators.  Patient education was provided.  -The patient had numerous questions today and I answered them to the best of my ability.   Asked me about "typical" disease course, what to expect, etc  -Talked about pramipexole that he was previously on.  It certainly could be possible that the sleepiness he was having was from the pramipexole, but it also seems that the side effect did not come up into he heard of a friend's brother-in-law who had a sleep attacks.  Regardless, I talked to him about the fact that all of the dopamine agonists can potentially have the side effect of sleepiness and sleep attacks.  They can also have the potential side effect of compulsive behaviors.  I explained in detail that no medication is side effect free.  We talked about the long-term risks, including motor fluctuations, of levodopa as his friend, who is a Marine scientist, told him to start that.  He has H&Y stage 1 disease and not sure that I am ready to start levodopa.  Talked about neupro which may be better tolerated.  In the end, I told him I didn't want to start anything yet, since he just started methotrexate yesterday and he thought that he had SE with this in the past.  I just don't want to confuse the picture.    -We discussed community resources in the area including patient support groups and community exercise programs for PD and pt education was provided to the patient.  -he asked me about the [facility] interdisciplinary clinic or a referral to [place] and I am happy to refer him.  He decided to hold on that.  He did say once he retires soon he may want to start getting more involved with our parkinsons program and will want referrals  then.   2.  Follow up is anticipated in the next few months, sooner should new neurologic issues arise.   Much greater than 50% of this visit was spent in counseling and coordinating care.  Total face to face  time:  30 min     Cc:  Nyoka Cowden, MD

## 2016-07-03 ENCOUNTER — Encounter: Payer: Self-pay | Admitting: Neurology

## 2016-07-03 ENCOUNTER — Ambulatory Visit (INDEPENDENT_AMBULATORY_CARE_PROVIDER_SITE_OTHER): Payer: Medicare HMO | Admitting: Neurology

## 2016-07-03 VITALS — BP 126/82 | HR 78 | Ht 71.0 in | Wt 217.0 lb

## 2016-07-03 DIAGNOSIS — M069 Rheumatoid arthritis, unspecified: Secondary | ICD-10-CM | POA: Diagnosis not present

## 2016-07-03 DIAGNOSIS — G20 Parkinson's disease: Secondary | ICD-10-CM | POA: Diagnosis not present

## 2016-07-03 HISTORY — PX: DEBRIDEMENT TOE: SUR397

## 2016-07-05 DIAGNOSIS — S91331A Puncture wound without foreign body, right foot, initial encounter: Secondary | ICD-10-CM | POA: Diagnosis not present

## 2016-07-06 DIAGNOSIS — S91301A Unspecified open wound, right foot, initial encounter: Secondary | ICD-10-CM | POA: Diagnosis not present

## 2016-07-08 ENCOUNTER — Ambulatory Visit (INDEPENDENT_AMBULATORY_CARE_PROVIDER_SITE_OTHER): Payer: Medicare HMO | Admitting: Podiatry

## 2016-07-08 ENCOUNTER — Encounter: Payer: Self-pay | Admitting: Podiatry

## 2016-07-08 ENCOUNTER — Telehealth: Payer: Self-pay | Admitting: *Deleted

## 2016-07-08 ENCOUNTER — Ambulatory Visit (HOSPITAL_BASED_OUTPATIENT_CLINIC_OR_DEPARTMENT_OTHER)
Admission: RE | Admit: 2016-07-08 | Discharge: 2016-07-08 | Disposition: A | Discharge status: Home / Self Care | Payer: Medicare HMO | Source: Ambulatory Visit | Attending: Podiatry | Admitting: Podiatry

## 2016-07-08 VITALS — BP 121/80 | HR 84 | Temp 97.9°F | Resp 18

## 2016-07-08 DIAGNOSIS — S93301A Unspecified subluxation of right foot, initial encounter: Secondary | ICD-10-CM | POA: Diagnosis not present

## 2016-07-08 DIAGNOSIS — L97512 Non-pressure chronic ulcer of other part of right foot with fat layer exposed: Secondary | ICD-10-CM | POA: Diagnosis not present

## 2016-07-08 DIAGNOSIS — M779 Enthesopathy, unspecified: Secondary | ICD-10-CM

## 2016-07-08 DIAGNOSIS — M7989 Other specified soft tissue disorders: Secondary | ICD-10-CM | POA: Diagnosis not present

## 2016-07-08 DIAGNOSIS — L02619 Cutaneous abscess of unspecified foot: Secondary | ICD-10-CM

## 2016-07-08 DIAGNOSIS — L03115 Cellulitis of right lower limb: Secondary | ICD-10-CM | POA: Diagnosis not present

## 2016-07-08 DIAGNOSIS — L03119 Cellulitis of unspecified part of limb: Principal | ICD-10-CM

## 2016-07-08 DIAGNOSIS — M79671 Pain in right foot: Secondary | ICD-10-CM | POA: Diagnosis present

## 2016-07-08 MED ORDER — CIPROFLOXACIN HCL 500 MG PO TABS: 500.0000 mg | ORAL_TABLET | Freq: Two times a day (BID) | ORAL | 0 refills | Status: DC

## 2016-07-08 MED ORDER — CLINDAMYCIN HCL 300 MG PO CAPS: 300.0000 mg | ORAL_CAPSULE | Freq: Three times a day (TID) | ORAL | 2 refills | Status: DC

## 2016-07-08 NOTE — Telephone Encounter (Addendum)
-----   Message from Trula Slade, DPM sent at 07/08/2016 11:18 AM EST ----- Can you please order an MRI of the right foot to rule out abscess/osteomtylitis. Orders to [facility] and gave to [facility] for [employer]. 07/09/2016-DrJacqualyn Posey ordered Calcium Alginate with Silver, 1 inch tape, saline 5 bottles, 1/4 inch iodoform packing for daily dressing changes of right plantar foot ulcer L97.512 measuring 6.0 x 5.0 x 2.0 cm with moderate drainage from [facility]. Orders and demographics faxed to [facility]. 07/10/2016-Pt states has an appt at MRI on Monday and appt with Dr. Jacqualyn Posey on Tuesday, needs to confirm appt time at [facility]. 07/28/2016-DrJacqualyn Posey asked to check with [facility] for wound culture results. I spoke with Leontine Locket, she states she will investigate and fax to our office.07/31/2016-Pt states Dr. Jacqualyn Posey had told him not to get refill of doxycycline until results had been reviewed for surgical biopsy. I ordered biopsy results from [facility]. Judson Roch states she will fax once they have been retrieved from the surgical room. Received wound culture and biopsy results of 07/23/2016 and Dr. Jacqualyn Posey reviewed as benign and ordered continue the doxycycline 100 mg capsules #28 one bid. Orders called to pt. Pt called for results. I left a message informing pt of the message left with results and instruction to continue the doxycycline that had been called to [facility]. 08/12/2016-I spoke with Dr. Gari Crown and asked to clarify diagnosis for toe bone biosy results. Dr. 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 I informed Dr. Jacqualyn Posey. Pt called states Dr. Jacqualyn Posey gave him a different antibiotic-Cipro and he doesn't understand why, he was told the biopsy grew out nothing and the bone was solid. I told him I did not see an explanation in the chart notes, but would ask Dr. Jacqualyn Posey and call again. I informed pt that  Dr. Jacqualyn Posey had wanted broaden the coverage of the antibiotic against the osteomyelitis pathology showed and also as a precautions since the wound had been opened for so long. Pt states understanding. 09/15/2016-Pt called states surgery foot flared up this weekend and had drainage. Unable to leave message to have pt make an appt. Left message on pt's mobile and work phone to make an appt for today.09/22/2016-Pt states he was seen by Dr. Jacqualyn Posey today and told to check status for his appt with Infectious Disease and the next available is 10/14/2016 at 9:00am, pt wondered if that would be okay or would Dr. Jacqualyn Posey like to talk to the Infectious Disease doctor for an earlier appt. I spoke with Loma Sousa - Infectious Disease and asked if they could get pt in either this week or next week, she states she will have to talk with the doctors and call again. Kennyth Lose - Infectious Disease states call the Triage Nurse [phone]. I spoke with Triage Nurse - Infectious Disease and she states it would be a doctor's decision to put pt in this week and Dr. Jacqualyn Posey would need to speak to the Doctor On Call - Dr. Tommy Medal [phone]/23/2016-Pt states he is on Neupro for Parkinsons Disease, he was not on this at the last surgery. 10/09/2016-Cheryl Crandall, pt's friend request copies of surgical pathology, cultures and biopsies to be picked up this evening. Dr. Jacqualyn Posey states had C. Clark - receptionist fax labs to Infectious Disease.

## 2016-07-09 DIAGNOSIS — L97512 Non-pressure chronic ulcer of other part of right foot with fat layer exposed: Secondary | ICD-10-CM | POA: Diagnosis not present

## 2016-07-09 LAB — CBC WITH DIFFERENTIAL/PLATELET
Basophils Absolute: 0 10*3/uL (ref 0.0–0.2)
Basos: 0 %
EOS (ABSOLUTE): 0.2 10*3/uL (ref 0.0–0.4)
EOS: 3 %
HEMATOCRIT: 35.2 % — AB (ref 37.5–51.0)
HEMOGLOBIN: 11.4 g/dL — AB (ref 13.0–17.7)
IMMATURE GRANS (ABS): 0 10*3/uL (ref 0.0–0.1)
IMMATURE GRANULOCYTES: 0 %
LYMPHS ABS: 1 10*3/uL (ref 0.7–3.1)
LYMPHS: 18 %
MCH: 25.7 pg — ABNORMAL LOW (ref 26.6–33.0)
MCHC: 32.4 g/dL (ref 31.5–35.7)
MCV: 80 fL (ref 79–97)
MONOCYTES: 13 %
Monocytes Absolute: 0.7 10*3/uL (ref 0.1–0.9)
Neutrophils Absolute: 3.9 10*3/uL (ref 1.4–7.0)
Neutrophils: 66 %
Platelets: 340 10*3/uL (ref 150–379)
RBC: 4.43 x10E6/uL (ref 4.14–5.80)
RDW: 14.5 % (ref 12.3–15.4)
WBC: 5.8 10*3/uL (ref 3.4–10.8)

## 2016-07-09 LAB — BASIC METABOLIC PANEL
BUN/Creatinine Ratio: 22 (ref 10–24)
BUN: 22 mg/dL (ref 8–27)
CALCIUM: 8.6 mg/dL (ref 8.6–10.2)
CHLORIDE: 101 mmol/L (ref 96–106)
CO2: 21 mmol/L (ref 18–29)
CREATININE: 0.98 mg/dL (ref 0.76–1.27)
GFR calc Af Amer: 91 mL/min/{1.73_m2} (ref >59)
GFR calc non Af Amer: 79 mL/min/{1.73_m2} (ref >59)
GLUCOSE: 93 mg/dL (ref 65–99)
Potassium: 4.7 mmol/L (ref 3.5–5.2)
Sodium: 139 mmol/L (ref 134–144)

## 2016-07-09 LAB — C-REACTIVE PROTEIN: CRP: 47.9 mg/L — AB (ref 0.0–4.9)

## 2016-07-10 ENCOUNTER — Telehealth: Payer: Self-pay | Admitting: *Deleted

## 2016-07-10 NOTE — Telephone Encounter (Signed)
"  He's coming in for a MRI of the foot right, with and without contrast.  It's going to need to be precerted through [facility].  She has [employer].  Thank you and hope your day is great!"

## 2016-07-11 NOTE — Progress Notes (Signed)
Subjective:     Patient ID: Samuel Moran, male   DOB: 04-21-1946, 71 y.o.   MRN: [id number]  HPI  Samuel Moran presents to the office today for concerns of an ulcer and possible abscess of his right foot. Last week his callus was trimmed. He was also also seen by his rheumatologist who thought he was having an arthritis flare. Over the weekend he started to notice a hole form where the callus was. He went to urgent care and the area was debrided. He then followed up with his PCP. He is not on antibiotics. A wound culture was obtained. He has not had blood work since this. He denies any systemic complaints such as fevers, chills, nausea, vomiting. He has no other complaints at this time.   Review of Systems  All other systems reviewed and are negative.      Objective:   Physical Exam General: AAO x3, NAD  Dermatological: On the plantar aspect of the right foot there is an ulceration sub metatarsal 3 area which probes very close to bone. There is a superficial granular ulcer present surrounding the area. There is chronic edema but he states that it has not worsened. There is no erythema. There is mild warmth to the foot. There is no fluctuance or crepitance. No other open lesions present.   Vascular: Dorsalis Pedis artery and Posterior Tibial artery pedal pulses are 2/4 bilateral with immedate capillary fill time. Pedal hair growth present. There is no pain with calf compression, swelling, warmth, erythema.   Neruologic: Grossly intact via light touch bilateral. Vibratory intact via tuning fork bilateral. Protective threshold with Semmes Wienstein monofilament intact to all pedal sites bilateral.   Musculoskeletal: Prominence of metatarsal heads plantar. There is mild tenderness to the ulcer.  Muscular strength 5/5 in all groups tested bilateral.  Gait: Unassisted, Nonantalgic.      Assessment:     Ulceration right foot with cellulitis; rule out abscess.     Plan:     -Treatment options  discussed including all alternatives, risks, and complications -Etiology of symptoms were discussed -X-rays obtained -Blood work ordered.  -Will start clinda and cipro. Awaiting culture results.  -Will order MRI to rule out abscess/osteomyelitis. X-rays negative.  -Discussed surgical intervention pending MRI -Monitor for any clinical signs or symptoms of infection and directed to call the office immediately should any occur or go to the ER. -RTC as scheduled or sooner if needed  Celesta Gentile, DPM

## 2016-07-11 NOTE — Telephone Encounter (Signed)
I called and left Threasa Beards a message that MRI was authorized from 07/11/2016 to 10/09/2016.  I asked her to call if she had any further questions.

## 2016-07-14 ENCOUNTER — Ambulatory Visit (HOSPITAL_COMMUNITY)
Admission: RE | Admit: 2016-07-14 | Discharge: 2016-07-14 | Disposition: A | Discharge status: Home / Self Care | Payer: Medicare HMO | Source: Ambulatory Visit | Attending: Podiatry | Admitting: Podiatry

## 2016-07-14 DIAGNOSIS — L03115 Cellulitis of right lower limb: Secondary | ICD-10-CM | POA: Diagnosis present

## 2016-07-14 DIAGNOSIS — L02611 Cutaneous abscess of right foot: Secondary | ICD-10-CM | POA: Diagnosis not present

## 2016-07-14 DIAGNOSIS — R600 Localized edema: Secondary | ICD-10-CM | POA: Diagnosis not present

## 2016-07-14 DIAGNOSIS — L97519 Non-pressure chronic ulcer of other part of right foot with unspecified severity: Secondary | ICD-10-CM | POA: Insufficient documentation

## 2016-07-14 MED ORDER — GADOBENATE DIMEGLUMINE 529 MG/ML IV SOLN
20.0000 mL | Freq: Once | INTRAVENOUS | Status: AC | PRN
  Administered 2016-07-14: 20 mL via INTRAVENOUS

## 2016-07-15 ENCOUNTER — Ambulatory Visit: Payer: Medicare HMO | Admitting: Podiatry

## 2016-07-16 ENCOUNTER — Ambulatory Visit (INDEPENDENT_AMBULATORY_CARE_PROVIDER_SITE_OTHER): Payer: Medicare HMO | Admitting: Podiatry

## 2016-07-16 ENCOUNTER — Encounter: Payer: Self-pay | Admitting: Podiatry

## 2016-07-16 VITALS — BP 156/87 | HR 77 | Resp 18

## 2016-07-16 DIAGNOSIS — L03119 Cellulitis of unspecified part of limb: Secondary | ICD-10-CM

## 2016-07-16 DIAGNOSIS — M86071 Acute hematogenous osteomyelitis, right ankle and foot: Secondary | ICD-10-CM | POA: Diagnosis not present

## 2016-07-16 DIAGNOSIS — L02619 Cutaneous abscess of unspecified foot: Secondary | ICD-10-CM | POA: Diagnosis not present

## 2016-07-16 MED ORDER — DOXYCYCLINE HYCLATE 100 MG PO TABS: 100.0000 mg | ORAL_TABLET | Freq: Two times a day (BID) | ORAL | 0 refills | Status: DC

## 2016-07-16 NOTE — Patient Instructions (Signed)

## 2016-07-17 NOTE — Progress Notes (Signed)
Subjective: Mr. Bonano presents to the office today for follow-up evaluation a right foot ulceration, infection. He states that he is doing much better. He states that the foot has not had any significant swelling and this at the smallest the foot has looked in a long time. He has continued with antibiotics but having some mild diarrhea. He has remained in the surgical shoe. Denies any systemic complaints such as fevers, chills, nausea, vomiting. No acute changes since last appointment, and no other complaints at this time.   Objective: AAO x3, NAD DP/PT pulses palpable bilaterally, CRT less than 3 seconds Protective sensation intact with Simms Weinstein monofilament, vibratory sensation intact, Achilles tendon reflex intact Previous hallux amputation present. Ulceration submetarsal 3 on the right foot continues  which measures approximately 0.3/ x 0.3 x 2 cm and proves very close to bone. There is a small amount of bloody drainage but there is no pus. There is no fluctuance, crepitance, malodor. There is no ascending cellulitis. There is mild edema to the foot however this overall is much improved.  No other open lesions are identified.  No open lesions or pre-ulcerative lesions.  No pain with calf compression, swelling, warmth, erythema  Assessment: Right foot continued ulceration, possible residual abscess, osteotomyelitis.   Plan: -All treatment options discussed with the patient including all alternatives, risks, complications.  -MRI results were discussed with the patient. See below.  -At this time the foot overall looks much better. However the ulcer does continue and goes to bone and MRI findings are consistent with osteomyelitis and abscess. Because of this I have recommended an I&D and bone biopsy to be done next week. Symptoms are improved but given the MRI and clinical findings I believe that surgery is warranted. Discussed possible amputation given these findings a swell but since the  foot looks much better hopefully we can continue antibiotics and local wound care to control the infection, but no guarantee given.  -The incision placement as well as the postoperative course was discussed with the patient. I discussed risks of the surgery which include, but not limited to, infection, bleeding, pain, swelling, need for further surgery, delayed or nonhealing, painful or ugly scar, numbness or sensation changes, over/under correction, recurrence, transfer lesions, further deformity, hardware failure, DVT/PE, loss of toe/foot. Patient understands these risks and wishes to proceed with surgery. The surgical consent was reviewed with the patient all 3 pages were signed. No promises or guarantees were given to the outcome of the procedure. All questions were answered to the best of my ability. Before the surgery the patient was encouraged to call the office if there is any further questions. The surgery will be performed at the [facility] on an outpatient basis. -D/C antibiotics and start doxycycline -Patient encouraged to call the office with any questions, concerns, change in symptoms.   IMPRESSION: Marrow edema and enhancement throughout the third toe, almost entire third metatarsal, entire fourth metatarsal and in the head of the first metatarsal are most consistent with osteomyelitis.  Rim enhancing fluid collection deep to an ulceration on the plantar surface of the foot measures 1.4 cm in diameter and is consistent with abscess.  Mixed signal intensity collections distal to the first metatarsal head and deep to the distal fourth metatarsal could be abscesses but have minimal rim enhancement may represent adventitial bursa or exuberant granulation tissue.  Celesta Gentile, DPM

## 2016-07-22 MED ORDER — GADOBENATE DIMEGLUMINE 529 MG/ML IV SOLN
20.0000 mL | Freq: Once | INTRAVENOUS | Status: AC | PRN
  Administered 2016-07-22: 20 mL via INTRAVENOUS

## 2016-07-23 DIAGNOSIS — L02619 Cutaneous abscess of unspecified foot: Secondary | ICD-10-CM | POA: Diagnosis not present

## 2016-07-23 DIAGNOSIS — M86071 Acute hematogenous osteomyelitis, right ankle and foot: Secondary | ICD-10-CM | POA: Diagnosis not present

## 2016-07-23 DIAGNOSIS — K219 Gastro-esophageal reflux disease without esophagitis: Secondary | ICD-10-CM | POA: Diagnosis not present

## 2016-07-23 DIAGNOSIS — L02611 Cutaneous abscess of right foot: Secondary | ICD-10-CM | POA: Diagnosis not present

## 2016-07-23 DIAGNOSIS — E11621 Type 2 diabetes mellitus with foot ulcer: Secondary | ICD-10-CM | POA: Diagnosis not present

## 2016-07-23 DIAGNOSIS — L97514 Non-pressure chronic ulcer of other part of right foot with necrosis of bone: Secondary | ICD-10-CM | POA: Diagnosis not present

## 2016-07-23 DIAGNOSIS — M86171 Other acute osteomyelitis, right ankle and foot: Secondary | ICD-10-CM | POA: Diagnosis not present

## 2016-07-23 DIAGNOSIS — M868X7 Other osteomyelitis, ankle and foot: Secondary | ICD-10-CM | POA: Diagnosis not present

## 2016-07-28 ENCOUNTER — Encounter: Payer: Self-pay | Admitting: Podiatry

## 2016-07-28 ENCOUNTER — Ambulatory Visit: Payer: Medicare HMO

## 2016-07-28 ENCOUNTER — Encounter: Payer: Medicare HMO | Admitting: Podiatry

## 2016-07-28 ENCOUNTER — Ambulatory Visit (INDEPENDENT_AMBULATORY_CARE_PROVIDER_SITE_OTHER): Payer: Self-pay | Admitting: Podiatry

## 2016-07-28 DIAGNOSIS — Z9889 Other specified postprocedural states: Secondary | ICD-10-CM

## 2016-07-28 DIAGNOSIS — M86071 Acute hematogenous osteomyelitis, right ankle and foot: Secondary | ICD-10-CM

## 2016-07-30 ENCOUNTER — Ambulatory Visit: Payer: Medicare HMO

## 2016-07-30 DIAGNOSIS — Z9889 Other specified postprocedural states: Secondary | ICD-10-CM | POA: Diagnosis not present

## 2016-07-30 DIAGNOSIS — M86071 Acute hematogenous osteomyelitis, right ankle and foot: Secondary | ICD-10-CM | POA: Diagnosis not present

## 2016-07-30 LAB — COMPREHENSIVE METABOLIC PANEL
ALBUMIN: 3.4 g/dL — AB (ref 3.6–5.1)
ALK PHOS: 56 U/L (ref 40–115)
ALT: 12 U/L (ref 9–46)
AST: 15 U/L (ref 10–35)
BILIRUBIN TOTAL: 0.6 mg/dL (ref 0.2–1.2)
BUN: 20 mg/dL (ref 7–25)
CALCIUM: 8.8 mg/dL (ref 8.6–10.3)
CO2: 24 mmol/L (ref 20–31)
Chloride: 106 mmol/L (ref 98–110)
Creat: 1.02 mg/dL (ref 0.70–1.25)
Glucose, Bld: 87 mg/dL (ref 65–99)
POTASSIUM: 4.4 mmol/L (ref 3.5–5.3)
Sodium: 138 mmol/L (ref 135–146)
Total Protein: 6.3 g/dL (ref 6.1–8.1)

## 2016-07-30 LAB — ALBUMIN: ALBUMIN: 3.5 g/dL — AB (ref 3.6–5.1)

## 2016-07-31 LAB — VITAMIN D 25 HYDROXY (VIT D DEFICIENCY, FRACTURES): Vit D, 25-Hydroxy: 45 ng/mL (ref 30–100)

## 2016-07-31 MED ORDER — DOXYCYCLINE HYCLATE 100 MG PO CAPS: 100.0000 mg | ORAL_CAPSULE | Freq: Two times a day (BID) | ORAL | 0 refills | Status: DC

## 2016-07-31 NOTE — Progress Notes (Signed)
Subjective: Samuel Moran is a 71 y.o. is seen today in office s/p right bone biopsy x 3 and wound excisional debridement preformed on 07/23/16. They state their pain is controlled. He has them in the surgical shoe but he states he is been pretty active denies feet more. He is continuing antibiotics. He is not taking pain medication at this time. Denies any systemic complaints such as fevers, chills, nausea, vomiting. No calf pain, chest pain, shortness of breath.   Objective: General: No acute distress, AAOx3  DP/PT pulses palpable 2/4, CRT < 3 sec to all digits.  Protective sensation intact. Motor function intact.  Right foot: Incision is well coapted without any evidence of dehiscence and sutures are intact. Wound on the plantar aspect of the foot submetatarsal 3 does continue. I'm unable to probe to bone today. There is no drainage or pus expressed. There is no swelling erythema, ascending cellulitis. There is mild edema. No fluctuance or crepitus.  No other areas of tenderness to bilateral lower extremities.  No other open lesions or pre-ulcerative lesions.  No pain with calf compression, swelling, warmth, erythema.   Assessment and Plan:  Status post right foot surgery, doing well with no complications   -Treatment options discussed including all alternatives, risks, and complications -X-rays were obtained and reviewed. No evidence of acute fracture. No definitive evidence of acute osteomyelitis. -Continue with saline wet-to-dry dressing changes daily and this was done today for the wound. Continue with the offloading surgical shoe with a heel. Continue analyze. I'm awaiting the cultures/pathology results. -Elevation -Pain medication as needed. -Monitor for any clinical signs or symptoms of infection and DVT/PE and directed to call the office immediately should any occur or go to the ER. -Follow-up in 1 week or sooner if any problems arise. In the meantime, encouraged to call the office  with any questions, concerns, change in symptoms.   Celesta Gentile, DPM

## 2016-08-04 ENCOUNTER — Ambulatory Visit (INDEPENDENT_AMBULATORY_CARE_PROVIDER_SITE_OTHER): Payer: Medicare HMO | Admitting: Podiatry

## 2016-08-04 DIAGNOSIS — Z9889 Other specified postprocedural states: Secondary | ICD-10-CM

## 2016-08-04 DIAGNOSIS — M86071 Acute hematogenous osteomyelitis, right ankle and foot: Secondary | ICD-10-CM

## 2016-08-05 ENCOUNTER — Encounter: Payer: Self-pay | Admitting: Podiatry

## 2016-08-06 NOTE — Progress Notes (Signed)
Subjective: Samuel Moran is a 71 y.o. is seen today in office s/p right bone biopsy x 3 and wound excisional debridement preformed on 07/23/16. He states that overall he is doing well. He has continue with saline wet-to-dry dressing into the wound. He's remain in the surgical shoe with offloading. He is continuing antibiotics. Is not taking pain medication. Denies any systemic complaints such as fevers, chills, nausea, vomiting. No calf pain, chest pain, shortness of breath.   Objective: General: No acute distress, AAOx3  DP/PT pulses palpable 2/4, CRT < 3 sec to all digits.  Protective sensation intact. Motor function intact.  Right foot: Incision is well coapted without any evidence of dehiscence and sutures are intact. Wound on the plantar aspect of the foot submetatarsal 3 does continue. Area still probes close to bone but there is no undermining or tunneling identified today. There is no drainage or pus. There is no significant erythema there is no ascending cellulitis. There is mild edema to the Foley he states is chronic and ongoing for several years and this is the best the foot is often some time as far as the swelling/size. There is no fluctuance or crepitus there is no malodor. Overall he states he is doing well. No other areas of tenderness to bilateral lower extremities.  No other open lesions or pre-ulcerative lesions.  No pain with calf compression, swelling, warmth, erythema.   Assessment and Plan:  Status post right foot surgery, doing well with no complications   -Treatment options discussed including all alternatives, risks, and complications -I discussed culture results as well as pathology. I am going to contact the pathologist to try to get clarification on the third metatarsal as it states his benign bone with osteomyelitis. -I discussed with his possible need for further surgery which may include amputation vs removal of the metatarsal head.  -Continue antibiotics for  now. -Continue with saline wet to dry dressing changes.  -Monitor for any clinical signs or symptoms of infection and directed to call the office immediately should any occur or go to the ER. -RTC as scheduled or sooner if needed.   Celesta Gentile, DPM  -X-rays were obtained and reviewed. No evidence of acute fracture. No definitive evidence of acute osteomyelitis. -Continue with saline wet-to-dry dressing changes daily and this was done today for the wound. Continue with the offloading surgical shoe with a heel. Continue analyze. I'm awaiting the cultures/pathology results. -Elevation -Pain medication as needed. -Monitor for any clinical signs or symptoms of infection and DVT/PE and directed to call the office immediately should any occur or go to the ER. -Follow-up in 1 week or sooner if any problems arise. In the meantime, encouraged to call the office with any questions, concerns, change in symptoms.   Celesta Gentile, DPM

## 2016-08-11 ENCOUNTER — Encounter: Payer: Self-pay | Admitting: Podiatry

## 2016-08-11 ENCOUNTER — Ambulatory Visit (INDEPENDENT_AMBULATORY_CARE_PROVIDER_SITE_OTHER): Payer: Medicare HMO

## 2016-08-11 ENCOUNTER — Ambulatory Visit (INDEPENDENT_AMBULATORY_CARE_PROVIDER_SITE_OTHER): Payer: Medicare HMO | Admitting: Podiatry

## 2016-08-11 ENCOUNTER — Ambulatory Visit: Payer: Medicare HMO

## 2016-08-11 DIAGNOSIS — Z9889 Other specified postprocedural states: Secondary | ICD-10-CM

## 2016-08-11 DIAGNOSIS — Z899 Acquired absence of limb, unspecified: Secondary | ICD-10-CM | POA: Diagnosis not present

## 2016-08-11 DIAGNOSIS — M2041 Other hammer toe(s) (acquired), right foot: Secondary | ICD-10-CM

## 2016-08-11 DIAGNOSIS — Z87898 Personal history of other specified conditions: Secondary | ICD-10-CM | POA: Diagnosis not present

## 2016-08-11 DIAGNOSIS — M216X1 Other acquired deformities of right foot: Secondary | ICD-10-CM | POA: Diagnosis not present

## 2016-08-11 DIAGNOSIS — G629 Polyneuropathy, unspecified: Secondary | ICD-10-CM

## 2016-08-11 DIAGNOSIS — M86071 Acute hematogenous osteomyelitis, right ankle and foot: Secondary | ICD-10-CM

## 2016-08-11 DIAGNOSIS — M2042 Other hammer toe(s) (acquired), left foot: Secondary | ICD-10-CM | POA: Diagnosis not present

## 2016-08-11 MED ORDER — DOXYCYCLINE HYCLATE 100 MG PO CAPS: 100.0000 mg | ORAL_CAPSULE | Freq: Two times a day (BID) | ORAL | 0 refills | Status: DC

## 2016-08-11 MED ORDER — CIPROFLOXACIN HCL 500 MG PO TABS: 500.0000 mg | ORAL_TABLET | Freq: Two times a day (BID) | ORAL | 0 refills | Status: DC

## 2016-08-12 NOTE — Telephone Encounter (Signed)
We have pathology that is showing osteomyelitis. I just wanted to broaden it to make sure more types of bacteria are covered as the wound has been open for a longer period of time and its more of a precaution.

## 2016-08-13 NOTE — Progress Notes (Addendum)
Subjective: Samuel Moran is a 71 y.o. is seen today in office s/p right bone biopsy x 3 and wound excisional debridement preformed on 07/23/16. He states that overall he is doing well. He continues to not have any pain and he also states that this is again the best the foot has looked in years as far as swelling. He denies any redness, drainage, or any other signs of infection. He is having no pain. Denies any systemic complaints such as fevers, chills, nausea, vomiting. No calf pain, chest pain, shortness of breath.   Objective: General: No acute distress, AAOx3  DP/PT pulses palpable 2/4, CRT < 3 sec to all digits.  Protective sensation intact. Motor function intact.  Right foot: Incision is well coapted without any evidence of dehiscence and sutures are intact. Wound on the plantar aspect of the foot submetatarsal 3 does continue. However appear to be much improved. Sutures were removed today and the wound today measures 0.6 x 0.4 x 0.4 cm. I'm unable to probe to any bone and overall the wound appears to be much improved. There is no swelling erythema, ascending cellulitis. There is no questions, crepitus, malodor. Continues be prominence the metatarsal heads plantarly. Previous hallux irritation. No other open lesions or pre-ulcerative lesions.  No pain with calf compression, swelling, warmth, erythema.   Assessment and Plan:  Status post right foot surgery, doing well with no complications   -Treatment options discussed including all alternatives, risks, and complications -Again discussed pathologyas well as microbiology results. Pathology is reveal onto my last the third metatarsal and confirmed by X-rays were obtained and reviewed today other is no gross bony destruction in the wound is healing well. Because this we will continue antibiotics. Prescribed doxycycline well Cipro for broad-spectrum. -continuing with saline with dry dressing changes. The wound is healing well however continue  to monitor for any signs or symptoms of infection. -I discussed with his possible need for further surgery which may include amputation vs removal of the metatarsal head.  -Continue antibiotics for now. -Continue with saline wet to dry dressing changes.  -Long term he is going to need a custom insert. He was molded for them today by Liliane Channel and were ordered.  -Monitor for any clinical signs or symptoms of infection and directed to call the office immediately should any occur or go to the ER. -RTC as scheduled or sooner if needed.   Celesta Gentile, DPM

## 2016-08-14 ENCOUNTER — Telehealth: Payer: Self-pay | Admitting: Neurology

## 2016-08-14 NOTE — Telephone Encounter (Signed)
Spoke with patient. He states he did have side effects on Methotrexate and he is now taking Leflunomide 20 mg daily and doing well.  His tremors have been worse the last couple of weeks. I talked to him about medications that he spoke about during his last visit (going back on Pramipexole vs Neupro patch). Patient decided to hold meds until visit in May to talk to Dr. Carles Collet about the medications options again.  He will call if needed prior to this.

## 2016-08-14 NOTE — Telephone Encounter (Signed)
Caller: PT  Urgent? No  Reason for the call: PT called to schedule an appointment for a reevaluation for medication due to the shaking getting worse, wants to know if this can be done over the phone or is an appointment needed, did make him an appointment

## 2016-08-14 NOTE — Telephone Encounter (Signed)
Left message on machine for patient to call back.   Just saw patient in March. He is not on medication for PD because he wanted to stop Mirapex at that time. He was restarting Methotrexate and we were holding on starting new meds to make sure no side effects from Methotrexate. Will discuss with patient when he calls back.

## 2016-08-25 ENCOUNTER — Ambulatory Visit (INDEPENDENT_AMBULATORY_CARE_PROVIDER_SITE_OTHER): Payer: Medicare HMO

## 2016-08-25 ENCOUNTER — Ambulatory Visit (INDEPENDENT_AMBULATORY_CARE_PROVIDER_SITE_OTHER): Payer: Medicare HMO | Admitting: Podiatry

## 2016-08-25 ENCOUNTER — Encounter: Payer: Self-pay | Admitting: Podiatry

## 2016-08-25 DIAGNOSIS — M869 Osteomyelitis, unspecified: Secondary | ICD-10-CM | POA: Diagnosis not present

## 2016-08-25 DIAGNOSIS — L84 Corns and callosities: Secondary | ICD-10-CM | POA: Diagnosis not present

## 2016-08-25 DIAGNOSIS — Z9889 Other specified postprocedural states: Secondary | ICD-10-CM | POA: Diagnosis not present

## 2016-08-26 NOTE — Progress Notes (Signed)
Subjective: Samuel Moran is a 71 y.o. is seen today in office s/p right bone biopsy x 3 and wound excisional debridement preformed on 07/23/16. He states that he believes the wound has "healed". He has not noticed any drainage or pus. He remains in the surgical shoe. He has continue with antibiotics, doxycycline she rash the ciprofloxacin. Denies any systemic complaints such as fevers, chills, nausea, vomiting. No calf pain, chest pain, shortness of breath.   Objective: General: No acute distress, AAOx3  DP/PT pulses palpable 2/4, CRT < 3 sec to all digits.  Protective sensation intact. Motor function intact.  Right foot: Wound submetatarsal 3 has a hyperkeratotic lesion over top of it. Upon debridement the wound appears to be healed however it is pre-ulcerative. There is no swelling erythema, ascending synovitis pairs mild edema to this area There is no fluctuance or crepitus or any malodor. Biopsy site for the 1st metatarsal is well healed. There is mild edema to the foot but he states that this has been chronic for several years.  No other open lesions or pre-ulcerative lesions.  No pain with calf compression, swelling, warmth, erythema.   Assessment and Plan:  Status post right foot surgery, doing well with no complications   -Treatment options discussed including all alternatives, risks, and complications -X-rays were obtained and reviewed with the patient. Status post biopsy site of the third metatarsal and first metatarsal noticeable but there is no other evidence of acute fracture or any definitive evidence of acute osteomyelitis. -Sharply debrided the callus without any complications and the wound appears to be healed however will continue to monitor closely. -He is awaiting his inserts. For now continue with surgical shoe. -Dry dressing daily.  -Finish course of antibiotics. After he finishes this round we will hold any further and observe.  -Monitor for any clinical signs or  symptoms of infection and directed to call the office immediately should any occur or go to the ER. -RTC as scheduled or sooner if needed.   Celesta Gentile, DPM

## 2016-08-28 DIAGNOSIS — Z6829 Body mass index (BMI) 29.0-29.9, adult: Secondary | ICD-10-CM | POA: Diagnosis not present

## 2016-08-28 DIAGNOSIS — Z79899 Other long term (current) drug therapy: Secondary | ICD-10-CM | POA: Diagnosis not present

## 2016-08-28 DIAGNOSIS — E663 Overweight: Secondary | ICD-10-CM | POA: Diagnosis not present

## 2016-08-28 DIAGNOSIS — M0579 Rheumatoid arthritis with rheumatoid factor of multiple sites without organ or systems involvement: Secondary | ICD-10-CM | POA: Diagnosis not present

## 2016-08-28 DIAGNOSIS — M2550 Pain in unspecified joint: Secondary | ICD-10-CM | POA: Diagnosis not present

## 2016-09-01 NOTE — Progress Notes (Signed)
Samuel Moran was seen today in the movement disorders clinic for neurologic consultation at the request of Nyoka Cowden, MD.  The consultation is for the evaluation of R hand tremor and gait change x 6 months.  Tremor is noted at rest.  He is R hand dominant.  Tremor is gone when he picks up arm or makes a fist.  Pt states that he takes methotrexate and he noted that after he takes it he will shuffle for a few days and not swing the arms.  Dr. [name], therefore, took him off of the medication 3 weeks ago and he feels that walking is a bit better.  The records that were made available to me were reviewed.  Does report hx of seizure in 2008 and was at self checkout and couldn't figure out how to do steps.  Got finished, walked to car and next thing he remembers is being in ambulance.  Apparently, got in car and had death grip on wheel.  EMS got there and he was confused and wouldn't unlock door.  They broke window and took him to hospital.  W/U negative except "white spot" on brain.  03/14/16 update:  Pt returns for f/u much earlier than expected.  Has dx of PD, and last visit he didn't want to start medication.  He decided almost immediately after the visit that he wanted to start medication but he wanted to come back and discuss and discuss the medication in more detail.  Has a myriad of questions today.  Insurance company denied authorization for his MRI brain.  07/03/16 update:  Patient follows up today.  I started him on pramipexole last visit.  He called me in February to state that he had no side effects with medication, but his friends brother-in-law had a sleep attack on the medication and he was concerned about the potential side effect.  We had discussed this potential side effect previously.  Since he was side effect free, I told him that I would recommend that he continue on the medication.  2 weeks later he called to tell me that he was sleeping all of the time on the medication.    Today he states that it was really sleepiness but "spaciness" and "I wasn't all there."   I told him just to discontinue the medication and we could talk about it at follow-up.  I did get a note from his rheumatologist, Dr. [name], dated 06/30/2016 that stated that the patient had an acute rheumatoid arthritis flareup.  She thought that it was because he discontinued his methotrexate.  The patient did that because he thought methotrexate was causing dizziness, but she did not think that was the case and thought it was the fact that he was taking a different version of folic acid.  She restarted methotrexate and folic acid.  Thinks that he is moving around okay short of the RA flare.  He is riding the bike for 10 min 3 times a week.  No falls.  No lightheadedness or near syncope.  Mood has been okay.    09/03/16 update:  Patient seen today in follow-up.  He is not on any Parkinson's medication, which was his desire.  He got off of methotrexate, which he felt caused side effects.  He is now on leflunomide and would like to get back on medications for Parkinson's disease.  I just got a note from his rheumatologist that the patient wanted to wait on restarting methotrexate until he  started his Parkinson's medicine.  The reason we waited last visit to restart his Parkinson's medicine was because he was getting started back on rheumatoid medicine.  He brings medication pamphlet on rheumatoid meds he is considering so that meds we choose don't interfere.   The patient is having more trouble with tremor and arm swing.  He has not had any falls.  He has not had any lightheadedness or near syncope.  He denies significant depression.  The only neuroimaging that has been done is a CT head back in 2009 which was unremarkable.  PREVIOUS MEDICATIONS: Pramipexole (patient had a friend who had a sleep attack on the medication and subsequently the patient developed sleepiness on the medication and we decided to  discontinue)  ALLERGIES:  No Known Allergies  CURRENT MEDICATIONS:  Outpatient Encounter Prescriptions as of 09/03/2016  Medication Sig  . ciprofloxacin (CIPRO) 500 MG tablet Take 1 tablet (500 mg total) by mouth 2 (two) times daily.  Marland Kitchen doxycycline (VIBRA-TABS) 100 MG tablet Take 1 tablet (100 mg total) by mouth 2 (two) times daily.  Marland Kitchen doxycycline (VIBRAMYCIN) 100 MG capsule Take 1 capsule (100 mg total) by mouth 2 (two) times daily.  . folic acid (FOLVITE) 1 MG tablet Take 1 mg by mouth daily.  Marland Kitchen ibuprofen (ADVIL,MOTRIN) 800 MG tablet Take 800 mg by mouth as needed.  . leflunomide (ARAVA) 20 MG tablet Take 20 mg by mouth daily.   . methotrexate (RHEUMATREX) 2.5 MG tablet Take 20 mg by mouth once a week. Caution:Chemotherapy. Protect from light.  . Multiple Vitamins-Minerals (CENTRUM SILVER PO) Take by mouth daily.    . Omega-3 Fatty Acids (FISH OIL) 1000 MG CAPS Take by mouth.  Marland Kitchen omeprazole (PRILOSEC) 20 MG capsule Take 20 mg by mouth daily.     No facility-administered encounter medications on file as of 09/03/2016.     PAST MEDICAL HISTORY:   Past Medical History:  Diagnosis Date  . ED (erectile dysfunction)   . GERD 07/01/2007  . 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 PROSTATE SPECIFIC ANTIGEN, ELEVATED 06/29/2007  . Rheumatoid arthritis ([place])   . SEIZURE DISORDER 07/01/2007   one epsiode only  . Spinal stenosis of lumbar region 09/22/2014    PAST SURGICAL HISTORY:   Past Surgical History:  Procedure Laterality Date  . AMPUTATION     right great toe - tramatic age 42    SOCIAL HISTORY:   Social History   Social History  . Marital status: Single    Spouse name: N/A  . Number of children: N/A  . Years of education: N/A   Occupational History  . Chartered certified accountant    Social History Main Topics  . Smoking status: Light Tobacco Smoker    Types: Cigars  . Smokeless tobacco: Never Used     Comment: 1 cigar a week  . Alcohol use 2.0 oz/week    4 Standard drinks or equivalent  per week     Comment: one drink a day (liquor)  . Drug use: No  . Sexual activity: Not on file   Other Topics Concern  . Not on file   Social History Narrative  . No narrative on file    FAMILY HISTORY:   Family Status  Relation Status  . Mother Deceased  . Father Deceased  . Child Alive    ROS:  LBP on the right.  A complete 10 system review of systems was obtained and was unremarkable apart from what is mentioned above.  PHYSICAL EXAMINATION:  VITALS:   There were no vitals filed for this visit.  GEN:  The patient appears stated age and is in NAD. HEENT:  Normocephalic, atraumatic.  The mucous membranes are moist. The superficial temporal arteries are without ropiness or tenderness. CV:  RRR Lungs:  CTAB Neck/HEME:  There are no carotid bruits bilaterally.  Neurological examination:  Orientation: The patient is alert and oriented x3.  Cranial nerves: There is good facial symmetry. There is facial hypomimia. The visual fields are full to confrontational testing. The speech is fluent and clear.  He is hypophonic.  Soft palate rises symmetrically and there is no tongue deviation. Hearing is intact to conversational tone. Sensation: Sensation is intact to light touch throughout. Motor: Strength is 5/5 in the bilateral upper and lower extremities.   Shoulder shrug is equal and symmetric.  There is no pronator drift.  Movement examination: Tone: There is mild increased tone in the RUE.  It is increased with activation on the right only.  There is normal tone elsewhere Abnormal movements: Rare tremor in the RUE that is mostly felt (as opposed to seen) with distraction procedures.  Coordination:  There is decremation with RAM's, with hand opening and closing on the right and some with finger taps on the L and toe taps on the left.   Gait and Station: The patient has minimal difficulty arising out of a deep-seated chair without the use of the hands. The patient's stride length  is normal with decreased arm swing bilaterally.  The patient has a negative pull test.      LABS    Chemistry      Component Value Date/Time   NA 138 07/30/2016 0810   NA 139 07/08/2016 1158   K 4.4 07/30/2016 0810   CL 106 07/30/2016 0810   CO2 24 07/30/2016 0810   BUN 20 07/30/2016 0810   BUN 22 07/08/2016 1158   CREATININE 1.02 07/30/2016 0810      Component Value Date/Time   CALCIUM 8.8 07/30/2016 0810   ALKPHOS 56 07/30/2016 0810   AST 15 07/30/2016 0810   ALT 12 07/30/2016 0810   BILITOT 0.6 07/30/2016 0810     Lab Results  Component Value Date   TSH 1.15 04/17/2015   No results found for: VITAMINB12   ASSESSMENT/PLAN:  1.  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  We discussed treatment options as well as prognostic indicators.  Patient education was provided.  -The patient had numerous questionsAgain today and I answered them to the best of my ability.     -Revisited our discussion last visit on dopamine agonist..  Talked about pramipexole that he was previously on.  It certainly could be possible that the sleepiness he was having was from the pramipexole, but it also seems that the side effect did not come up until he heard of a friend's brother-in-law who had  sleep attacks.  Regardless, I talked to him about the fact that all of the dopamine agonists can potentially have the side effect of sleepiness and sleep attacks.  They can also have the potential side effect of compulsive behaviors.  I explained in detail that no medication is side effect free.  Talked about neupro which may be better tolerated.  Ultimately, the patient decided to patient decided to try that.  Given samples and he actually put the first 31m on in the room.  Given 2 mg samples  for a week and will work to 44m after a week.  Told to rotate sites to avoid skin reaction and given diagram about that.  Risks, benefits,  side effects and alternative therapies were discussed.  The opportunity to ask questions was given and they were answered to the best of my ability.  The patient expressed understanding and willingness to follow the outlined treatment protocols.  2.  Follow up is anticipated in the next few months, sooner should new neurologic issues arise.   Much greater than 50% of this visit was spent in counseling and coordinating care.  Total face to face time:  25 min     Cc:  KNyoka Cowden MD

## 2016-09-03 ENCOUNTER — Ambulatory Visit (INDEPENDENT_AMBULATORY_CARE_PROVIDER_SITE_OTHER): Payer: Medicare HMO | Admitting: Neurology

## 2016-09-03 ENCOUNTER — Encounter: Payer: Self-pay | Admitting: Neurology

## 2016-09-03 VITALS — BP 140/90 | HR 80 | Ht 71.0 in | Wt 215.0 lb

## 2016-09-03 DIAGNOSIS — M069 Rheumatoid arthritis, unspecified: Secondary | ICD-10-CM | POA: Diagnosis not present

## 2016-09-03 DIAGNOSIS — G20 Parkinson's disease: Secondary | ICD-10-CM

## 2016-09-03 MED ORDER — ROTIGOTINE 4 MG/24HR TD PT24: 1.0000 | MEDICATED_PATCH | Freq: Every day | TRANSDERMAL | 0 refills | Status: DC

## 2016-09-03 MED ORDER — ROTIGOTINE 2 MG/24HR TD PT24: 1.0000 | MEDICATED_PATCH | Freq: Every day | TRANSDERMAL | 0 refills | Status: DC

## 2016-09-04 ENCOUNTER — Ambulatory Visit (INDEPENDENT_AMBULATORY_CARE_PROVIDER_SITE_OTHER): Payer: Medicare HMO | Admitting: Podiatry

## 2016-09-04 DIAGNOSIS — L84 Corns and callosities: Secondary | ICD-10-CM | POA: Diagnosis not present

## 2016-09-04 DIAGNOSIS — M869 Osteomyelitis, unspecified: Secondary | ICD-10-CM

## 2016-09-08 NOTE — Progress Notes (Signed)
Subjective: Samuel Moran is a 71 y.o. is seen today in office s/p right bone biopsy x 3 and wound excisional debridement preformed on 07/23/16. He states he is doing well. He's been wearing a regular shoe. He feels that the wound is still healed he has not noticed any swelling or redness or drainage. He denies any systemic complaints as fevers, chills, nausea, vomiting. Denies any calf pain, chest pain, shortness of breath. He has no other concerns today.  Objective: General: No acute distress, AAOx3  DP/PT pulses palpable 2/4, CRT < 3 sec to all digits.  Protective sensation intact. Motor function intact.  Right foot: Hyperkeratotic lesion submetatarsal 3 right foot. Upon debridement this area is pre-ulcerative how there is no definitive open sores identified today. There is mild edema to the foot however he states this has been chronic for several years and this is still less swelling than he normally has had over the last couple years. There is no erythema, ascending synovitis. There is no fluctuance or crepitus or any malodor. There is prominent metatarsal heads plantarly with atrophy of the fat pad as well as chronically dislocated toes of the MPJ. 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 No other open lesions or pre-ulcerative lesions.  No pain with calf compression, swelling, warmth, erythema.   Assessment and Plan:  Status post right foot surgery, doing well with no complications   -Treatment options discussed including all alternatives, risks, and complications -Debrided the hyperkeratotic lesion similar any complications or bleeding. The wound appears to be healed still. I do recommend him to continue the bandages area for protection top offload the area. I did try to dispense the inserts or him today but they were too bulky in his shoe knee did not wear them. We will send him back. I will discuss this with work as well. -Currently not on antibiotics at this  time. We're and observe off of antibiotics as he does. -Monitor for any clinical signs or symptoms of infection and directed to call the office immediately should any occur or go to the ER. -RTC as scheduled or sooner if needed.   Celesta Gentile, DPM

## 2016-09-10 ENCOUNTER — Telehealth: Payer: Self-pay | Admitting: Neurology

## 2016-09-10 NOTE — Telephone Encounter (Signed)
Noted  

## 2016-09-10 NOTE — Telephone Encounter (Signed)
Patient just finished his nuepro 2mg  and is getting ready to start the 4mg 

## 2016-09-15 ENCOUNTER — Ambulatory Visit (INDEPENDENT_AMBULATORY_CARE_PROVIDER_SITE_OTHER): Payer: Medicare HMO | Admitting: Podiatry

## 2016-09-15 ENCOUNTER — Encounter: Payer: Self-pay | Admitting: Podiatry

## 2016-09-15 ENCOUNTER — Ambulatory Visit (INDEPENDENT_AMBULATORY_CARE_PROVIDER_SITE_OTHER): Payer: Medicare HMO

## 2016-09-15 DIAGNOSIS — L02619 Cutaneous abscess of unspecified foot: Secondary | ICD-10-CM | POA: Diagnosis not present

## 2016-09-15 DIAGNOSIS — L03119 Cellulitis of unspecified part of limb: Secondary | ICD-10-CM | POA: Diagnosis not present

## 2016-09-15 DIAGNOSIS — R52 Pain, unspecified: Secondary | ICD-10-CM | POA: Diagnosis not present

## 2016-09-15 DIAGNOSIS — M869 Osteomyelitis, unspecified: Secondary | ICD-10-CM

## 2016-09-15 MED ORDER — DOXYCYCLINE HYCLATE 100 MG PO TABS: 100.0000 mg | ORAL_TABLET | Freq: Two times a day (BID) | ORAL | 0 refills | Status: DC

## 2016-09-15 MED ORDER — CIPROFLOXACIN HCL 500 MG PO TABS: 500.0000 mg | ORAL_TABLET | Freq: Two times a day (BID) | ORAL | 0 refills | Status: DC

## 2016-09-15 NOTE — Progress Notes (Addendum)
Subjective: 71 year old male presents the office today for concerns of a flare in his right foot. He states that over the weekend his foot surgery get red and swollen. He feels that is doing better today however. He denies any red streaks or any drainage or pus that he has noticed.He has and wearing a regular shoe. He states that on Saturday he was doing yard work and on feet more which needs aggravated symptoms. Denies any systemic complaints such as fevers, chills, nausea, vomiting. No acute changes since last appointment, and no other complaints at this time.   Objective: AAO x3, NAD DP/PT pulses palpable bilaterally, CRT less than 3 seconds Previous amputation of the hallux. There is prominence of metatarsal heads plantar. Sub-metatarsal 3 there is localized edema and erythema. There is a hyperkeratotic lesion submetatarsal 3. Upon debridement purulence was expressed and there was a wound appropriately docked the third metatarsal head plantarly. This is how this wound started when I first met him. He does tunnel around the area but only able to palpate the third metatarsal head. There is chronic dislocation of the MPJs of the digits. There is no fluctuance or crepitus there is no malodor. Oh ascending synovitis in the erythema appears to be localized. No open lesions or pre-ulcerative lesions.  No pain with calf compression, swelling, warmth, erythema  Assessment: Abscess right foot, osteomyelitis third metatarsal head  Plan: -All treatment options discussed with the patient including all alternatives, risks, complications.  -At this time the wound did heal but it did open back up. This has been ongoing issue apparently for several years. This point the infection has returned. I recommended third metatarsal head excision to remove the osteomyelitis. He does not want have amputation of the toe. He does not want a TMA. We had a long discussion in regards to treatment at this time. Ultimately, I  think this needs to be a staged surgery if he wants to salvage the foot. We will start with an I&D, wound debridement and 3rd metatarsal head excision. He will likely need to go back for more surgery including removing the 2nd and 4th but am hesitant on doing this with the active infection and they look alright on x-ray right now.  -Infectious disease consult -Would culture obtained today.  -Return to surgical she/offloading -Doxy, cipro started -He cannot do surgery this week and will plan on next week. I will see him on Monday for consent form and see how he is doing. Any worsening he is to right to the ER.   Celesta Gentile, DPM

## 2016-09-16 ENCOUNTER — Telehealth: Payer: Self-pay | Admitting: Neurology

## 2016-09-16 MED ORDER — ROTIGOTINE 4 MG/24HR TD PT24: 1.0000 | MEDICATED_PATCH | Freq: Every day | TRANSDERMAL | 2 refills | Status: DC

## 2016-09-16 NOTE — Telephone Encounter (Signed)
Patient is doing well on Neupro. No side effects. Medication sent to pharmacy.

## 2016-09-16 NOTE — Telephone Encounter (Signed)
Caller: Samuel Moran  Urgent? No  Reason for the call: He was calling regarding his Neupro medication. He was needing to know should he continue with the 4 MG? Thanks

## 2016-09-17 ENCOUNTER — Telehealth: Payer: Self-pay | Admitting: Podiatry

## 2016-09-17 NOTE — Telephone Encounter (Signed)
That is fine to change 2 times a day. I want all that fluid to drain out and not get caught in there again. If there is any worsening swelling, redness, drainage or pus to call us or go to the ER. Also monitor for any fevers, chills, or other flu like symptoms.   Also, if we cannot get him into ID at [facility] we can try [facility].

## 2016-09-17 NOTE — Telephone Encounter (Addendum)
Left message informing pt that the referral was made to Infectious Disease and they would review and call him for an appt, but if he would like to see his status for an appt call [phone]. I also informed pt I would tell Dr. Jacqualyn Posey of the frequency of the dressing changes due to drainage. Left message informing pt of Dr. Jacqualyn Posey' 09/17/2016 2:41pm orders.

## 2016-09-17 NOTE — Telephone Encounter (Signed)
Samuel Moran, Dr. Jacqualyn Posey was supposed to call an infection disease doctors office when I saw him Monday and it is now Wednesday. I have not heard anything and would like to know what is going on. Also, we have been changing the dressing twice a day because the fluid is building up from squeezing it out.

## 2016-09-18 LAB — WOUND CULTURE: Gram Stain: NONE SEEN

## 2016-09-22 ENCOUNTER — Ambulatory Visit (INDEPENDENT_AMBULATORY_CARE_PROVIDER_SITE_OTHER): Payer: Medicare HMO | Admitting: Podiatry

## 2016-09-22 ENCOUNTER — Encounter: Payer: Self-pay | Admitting: Podiatry

## 2016-09-22 DIAGNOSIS — M216X1 Other acquired deformities of right foot: Secondary | ICD-10-CM | POA: Diagnosis not present

## 2016-09-22 DIAGNOSIS — M869 Osteomyelitis, unspecified: Secondary | ICD-10-CM

## 2016-09-22 NOTE — Patient Instructions (Signed)

## 2016-09-23 NOTE — Telephone Encounter (Signed)
Ok, should not be an issue.

## 2016-09-24 DIAGNOSIS — M86171 Other acute osteomyelitis, right ankle and foot: Secondary | ICD-10-CM | POA: Diagnosis not present

## 2016-09-24 DIAGNOSIS — M86671 Other chronic osteomyelitis, right ankle and foot: Secondary | ICD-10-CM | POA: Diagnosis not present

## 2016-09-24 DIAGNOSIS — M86179 Other acute osteomyelitis, unspecified ankle and foot: Secondary | ICD-10-CM | POA: Diagnosis not present

## 2016-09-24 DIAGNOSIS — M216X1 Other acquired deformities of right foot: Secondary | ICD-10-CM | POA: Diagnosis not present

## 2016-09-24 DIAGNOSIS — K219 Gastro-esophageal reflux disease without esophagitis: Secondary | ICD-10-CM | POA: Diagnosis not present

## 2016-09-24 DIAGNOSIS — L02611 Cutaneous abscess of right foot: Secondary | ICD-10-CM | POA: Diagnosis not present

## 2016-09-24 DIAGNOSIS — M21541 Acquired clubfoot, right foot: Secondary | ICD-10-CM | POA: Diagnosis not present

## 2016-09-25 NOTE — Progress Notes (Signed)
Subjective: 71 year old male presents the office today for concerns of a flare in his right foot. He states the drainage to the foot has improved his swelling is also improved with the wound does continue on the bottom of the right foot. He states that you have proceed with surgical intervention to help get rid of infection and help prevent any future recurrence of the wounds. He is remaining of the surgical shoe with a heel wedge. Denies any systemic complaints such as fevers, chills, nausea, vomiting. No acute changes since last appointment, and no other complaints at this time.   Objective: AAO x3, NAD DP/PT pulses palpable bilaterally, CRT less than 3 seconds Previous amputation of the hallux. There is prominence of metatarsal heads plantar. 7 social 3 continues to be a wound measuring about 1 x 0.5 cm  And the wound does probe very close the third metatarsal head. The wound does tunnel both medially and laterally but unable to probe the other metatarsal heads at this time. There is chronic edema to the plantar aspect of the foot as well as chronic edema to the flap on the previous hallux amputation site. There is no fluctuance or crepitus there is no ascending cellulitis. Overall foot against inserts to look better however the wound does continue. No open lesions or pre-ulcerative lesions.  No pain with calf compression, swelling, warmth, erythema  Assessment: Osteomyelitis third metatarsal head; chronic deformity/discoloation of lesser digits.   Plan: -All treatment options discussed with the patient including all alternatives, risks, complications.  -At this time given the x-ray findings concerning for osteomy arise the third metatarsal head as well as chronic deformity is reoccurring wound I recommended incision and drainage, wound debridement as well as at least a third metatarsal head excision. Long-term I believe she benefit more from having the second, third, fourth metatarsal head  excision. He was doing this while one surgery if possible. Discussed with him the lifting of the third metatarsal head and if able uptake at the other ones during the same surgery. Discussed with him the surgery as well as the postoperative course. He was a comminuted today by his friend. -The incision placement as well as the postoperative course was discussed with the patient. I discussed risks of the surgery which include, but not limited to, infection, bleeding, pain, swelling, need for further surgery, delayed or nonhealing, painful or ugly scar, numbness or sensation changes, over/under correction, recurrence, transfer lesions, further deformity, hardware failure, DVT/PE, loss of toe/foot. Patient understands these risks and wishes to proceed with surgery. The surgical consent was reviewed with the patient all 3 pages were signed. No promises or guarantees were given to the outcome of the procedure. All questions were answered to the best of my ability. Before the surgery the patient was encouraged to call the office if there is any further questions. The surgery will be performed at the [facility] on an outpatient basis. -Awaiting ID consult.   Celesta Gentile, DPM

## 2016-09-26 ENCOUNTER — Ambulatory Visit (INDEPENDENT_AMBULATORY_CARE_PROVIDER_SITE_OTHER): Payer: Self-pay | Admitting: Podiatry

## 2016-09-26 ENCOUNTER — Encounter: Payer: Self-pay | Admitting: Podiatry

## 2016-09-26 ENCOUNTER — Ambulatory Visit (INDEPENDENT_AMBULATORY_CARE_PROVIDER_SITE_OTHER): Payer: Medicare HMO

## 2016-09-26 DIAGNOSIS — M869 Osteomyelitis, unspecified: Secondary | ICD-10-CM

## 2016-09-26 DIAGNOSIS — M216X1 Other acquired deformities of right foot: Secondary | ICD-10-CM

## 2016-09-26 MED ORDER — HYDROCODONE-ACETAMINOPHEN 5-325 MG PO TABS: 1.0000 | ORAL_TABLET | Freq: Four times a day (QID) | ORAL | 0 refills | Status: DC | PRN

## 2016-09-26 MED ORDER — CIPROFLOXACIN HCL 500 MG PO TABS: 500.0000 mg | ORAL_TABLET | Freq: Two times a day (BID) | ORAL | 0 refills | Status: DC

## 2016-09-26 MED ORDER — DOXYCYCLINE HYCLATE 100 MG PO TABS: 100.0000 mg | ORAL_TABLET | Freq: Two times a day (BID) | ORAL | 0 refills | Status: DC

## 2016-09-30 NOTE — Progress Notes (Signed)
Subjective: Samuel Moran is a 71 y.o. is seen today in office s/p right foot I&D, metatarsal head excision of 2-4 preformed on 09/24/2016. They state their pain is minimal. He has remained in the surgical shoe. He has continued with antibiotics. Denies any systemic complaints such as fevers, chills, nausea, vomiting. No calf pain, chest pain, shortness of breath.   Objective: General: No acute distress, AAOx3  DP/PT pulses palpable 2/4, CRT < 3 sec to all digits.  Protective sensation intact. Motor function intact.  Right foot: Wound continues on the plantar aspect of the right foot sub metatarsal 3. There is decreased pressure on the plantar aspect of the foot status post metatarsal head excision. There is no drainage or pus expressed. There is mild edema. No surrounding erythema, ascending synovitis. There is no fluctuance, crepitus, malodor. No other areas of tenderness to bilateral lower extremities.  No other open lesions or pre-ulcerative lesions.  No pain with calf compression, swelling, warmth, erythema.   Assessment and Plan:  Status post right foot surgery, doing well with no complications   -Treatment options discussed including all alternatives, risks, and complications -X-rays were obtained and reviewed with the patient. Status post metatarsal head excision 2 through 4 -Recommended daily saline wet to dry dressing changes. -Continue with surgical shoe -Awaiting ID consult; cultures  -Ice/elevation -Pain medication as needed. -Monitor for any clinical signs or symptoms of infection and DVT/PE and directed to call the office immediately should any occur or go to the ER. -Follow-up in 1 week or sooner if any problems arise. In the meantime, encouraged to call the office with any questions, concerns, change in symptoms.   Celesta Gentile, DPM

## 2016-10-01 ENCOUNTER — Encounter: Payer: Self-pay | Admitting: Podiatry

## 2016-10-02 ENCOUNTER — Ambulatory Visit: Payer: Medicare HMO

## 2016-10-02 ENCOUNTER — Ambulatory Visit (INDEPENDENT_AMBULATORY_CARE_PROVIDER_SITE_OTHER): Payer: Self-pay | Admitting: Podiatry

## 2016-10-02 ENCOUNTER — Encounter: Payer: Self-pay | Admitting: Podiatry

## 2016-10-02 DIAGNOSIS — M869 Osteomyelitis, unspecified: Secondary | ICD-10-CM

## 2016-10-02 MED ORDER — CIPROFLOXACIN HCL 500 MG PO TABS: 500.0000 mg | ORAL_TABLET | Freq: Two times a day (BID) | ORAL | 0 refills | Status: DC

## 2016-10-02 MED ORDER — DOXYCYCLINE HYCLATE 100 MG PO TABS: 100.0000 mg | ORAL_TABLET | Freq: Two times a day (BID) | ORAL | 0 refills | Status: DC

## 2016-10-03 NOTE — Progress Notes (Signed)
Subjective: Samuel Moran is a 71 y.o. is seen today in office s/p right foot I&D, metatarsal head excision of 2-4 preformed on 09/24/2016. He states he is doing well and not having any pain. He states he just board at home as he is been off his feet. This for and his been changing the bandage daily with saline wet-to-dry packing. They have not noticed any pus. Denies any increase in swelling or redness. Overall he states he is doing well he has no concerns. No calf pain, chest pain, shortness of breath.   Objective: General: No acute distress, AAOx3  DP/PT pulses palpable 2/4, CRT < 3 sec to all digits.  Protective sensation intact. Motor function intact.  Right foot: Wound continues on the plantar aspect of the right foot sub metatarsal 3. The wound does probe approximately 2.4 cm however unable to probe to bone. Status post metatarsal head excision 2 through 4 with decreased plantar pressures. There is no surrounding erythema, ascending cellulitis from the wound. There is no pus expressed however small wound of clear bloody drainage is expressed. No other areas of tenderness to bilateral lower extremities.  No other open lesions or pre-ulcerative lesions.  No pain with calf compression, swelling, warmth, erythema.   Assessment and Plan:  Status post right foot surgery, doing well with no complications   -Treatment options discussed including all alternatives, risks, and complications -Dressings were changed with saline wet-to-dry packing. -Awaiting infectious disease consult. For now we will continue with ciprofloxacin and doxycycline. I reviewed the cultures and pathology from surgery. -Continue elevation. -Surgical shoe with offloading heel. -He is not taking pain medication. -Monitor for any clinical signs or symptoms of infection and directed to call the office immediately should any occur or go to the ER. -Follow-up in 1 week or sooner if any problems arise. In the meantime,  encouraged to call the office with any questions, concerns, change in symptoms.   Celesta Gentile, DPM

## 2016-10-06 ENCOUNTER — Telehealth: Payer: Self-pay | Admitting: *Deleted

## 2016-10-06 NOTE — Telephone Encounter (Signed)
Patient called stating he has an MD appt with Dr. Megan Salon on 10/14/16 to establish care. He was referred by [facility] for cellulitis and abscess of foot. He has vacation plans from 11/15/16 through 11/24/16 and wanted to know if he could travel with a "picc line". Advised patient that Dr. Megan Salon will need to access him and see if he needs a picc line. He is currently on two oral antibiotics. Will give his notes to MD tomorrow and call patient back after review. He wants to know if he can put off the appointment until he comes back from vacation. Myrtis Hopping

## 2016-10-06 NOTE — Telephone Encounter (Signed)
Please let [name] know that I cannot determine if he will need a PICC and IV antibiotics until I have seen him in clinic. It is up to him and/or Dr. Jacqualyn Posey to determine if it is okay for him to postpone his appointment until he returns from vacation in late July.

## 2016-10-07 ENCOUNTER — Encounter: Payer: Self-pay | Admitting: Podiatry

## 2016-10-07 NOTE — Telephone Encounter (Signed)
Patient informed  Myrtis Hopping CMA

## 2016-10-07 NOTE — Progress Notes (Signed)
Right foot incision and drainage Bone biopsy

## 2016-10-07 NOTE — Progress Notes (Signed)
Right foot incision and drainage Removal of metatarsal heads of 2, 3, 4 metatarsaly if able

## 2016-10-09 ENCOUNTER — Ambulatory Visit (INDEPENDENT_AMBULATORY_CARE_PROVIDER_SITE_OTHER): Payer: Medicare HMO | Admitting: Podiatry

## 2016-10-09 ENCOUNTER — Encounter: Payer: Self-pay | Admitting: Podiatry

## 2016-10-09 DIAGNOSIS — M216X1 Other acquired deformities of right foot: Secondary | ICD-10-CM

## 2016-10-09 DIAGNOSIS — M869 Osteomyelitis, unspecified: Secondary | ICD-10-CM

## 2016-10-10 ENCOUNTER — Telehealth: Payer: Self-pay | Admitting: Neurology

## 2016-10-10 NOTE — Telephone Encounter (Signed)
Caller: Baltazar Najjar  Urgent? No  Reason for the call: Since starting the patch medication he has noticed his Right Arm Shaking. Please Call. Thanks

## 2016-10-10 NOTE — Telephone Encounter (Signed)
Patient has noticed increase shaking in his right hand since starting the patch.  Informed him that Dr. Carles Collet is out of the office but that I would let her know and call him back with advice.

## 2016-10-11 ENCOUNTER — Other Ambulatory Visit: Payer: Self-pay | Admitting: Podiatry

## 2016-10-12 NOTE — Progress Notes (Signed)
Subjective: Samuel Moran is a 71 y.o. is seen today in office s/p right foot I&D, metatarsal head excision of 2-4 preformed on 09/24/2016 history and continues to change the packing daily with saline wet-to-dry packing the wound. He states that he is having no pain adhesions board at home as he has not been doing anything. He has been trying to stay off of his feet as much as possible. He has an upcoming appointment with infectious disease next week. He denies any drainage or pus. Denies any increase in swelling or redness. He has no pain. He has no systemic complaints such as fevers, chills, nausea, vomiting. No calf pain, chest pain, shortness of breath.  Objective: General: No acute distress, AAOx3  DP/PT pulses palpable 2/4, CRT < 3 sec to all digits.  Protective sensation intact. Motor function intact.  Right foot: Wound continues on the plantar aspect of the right foot sub metatarsal 3. The wound does probe approximately 1.6 cm however unable to probe to bone. Status post metatarsal head excision 2 through 4 with decreased plantar pressures. There is no surrounding erythema, ascending cellulitis from the wound. There is no pus expressed however small wound of clear bloody drainage is expressed. Overall the wound is healing.  No other areas of tenderness to bilateral lower extremities.  No other open lesions or pre-ulcerative lesions.  No pain with calf compression, swelling, warmth, erythema.   Assessment and Plan:  Status post right foot surgery, doing well  -Treatment options discussed including all alternatives, risks, and complications -Again reviewed cultures and pathology. Copies of this was given to the patient I also faxed this to infectious disease for their review next week. He has been on doxycycline and ciprofloxacin. We will continue with this until he sees infectious disease. -Continue daily dressing changes with saline with dry packing. -Elevation. -Continue darker shoe  with a heel to take pressure off the forefoot plantarly. -Overall he states the swelling has improved. He states that for several years she's had chronic swelling to this foot and this is the least amount of swelling that he has been having.  -Monitor for any clinical signs or symptoms of infection and directed to call the office immediately should any occur or go to the ER. -Follow-up in 1 week or sooner if any problems arise. In the meantime, encouraged to call the office with any questions, concerns, change in symptoms.   Celesta Gentile, DPM

## 2016-10-13 DIAGNOSIS — M869 Osteomyelitis, unspecified: Secondary | ICD-10-CM | POA: Insufficient documentation

## 2016-10-13 DIAGNOSIS — G20 Parkinson's disease: Secondary | ICD-10-CM | POA: Insufficient documentation

## 2016-10-13 NOTE — Telephone Encounter (Signed)
Please advise 

## 2016-10-13 NOTE — Telephone Encounter (Signed)
Left message on machine for patient to call back.

## 2016-10-13 NOTE — Telephone Encounter (Signed)
Spoke with patient. He states his tremors were doing better but two weeks ago he noticed increased tremors in the right hand when he is gripping (like holding a phone) and more towards the end of the day (when he is tired). He is currently on 4 mg Neupro daily. Please advise.

## 2016-10-13 NOTE — Telephone Encounter (Signed)
Patient made aware. Requested sooner appt. Since follow up in August. He was placed on a wait list.

## 2016-10-13 NOTE — Telephone Encounter (Signed)
So, he thinks that the patch caused the shaking?  That doesn't make sense but he can d/c it if that is what he thinks and see how he does.

## 2016-10-13 NOTE — Telephone Encounter (Signed)
Would rather hold on possible increasing the med until next visit, if he can wait

## 2016-10-14 ENCOUNTER — Encounter: Payer: Self-pay | Admitting: Internal Medicine

## 2016-10-14 ENCOUNTER — Ambulatory Visit (INDEPENDENT_AMBULATORY_CARE_PROVIDER_SITE_OTHER): Payer: Medicare HMO | Admitting: Internal Medicine

## 2016-10-14 DIAGNOSIS — G20 Parkinson's disease: Secondary | ICD-10-CM

## 2016-10-14 DIAGNOSIS — M86471 Chronic osteomyelitis with draining sinus, right ankle and foot: Secondary | ICD-10-CM

## 2016-10-14 DIAGNOSIS — G20A1 Parkinson's disease without dyskinesia, without mention of fluctuations: Secondary | ICD-10-CM

## 2016-10-14 LAB — CBC
HCT: 37.5 % — ABNORMAL LOW (ref 38.5–50.0)
HEMOGLOBIN: 11.7 g/dL — AB (ref 13.2–17.1)
MCH: 26.8 pg — ABNORMAL LOW (ref 27.0–33.0)
MCHC: 31.2 g/dL — ABNORMAL LOW (ref 32.0–36.0)
MCV: 85.8 fL (ref 80.0–100.0)
MPV: 9.4 fL (ref 7.5–12.5)
Platelets: 246 10*3/uL (ref 140–400)
RBC: 4.37 MIL/uL (ref 4.20–5.80)
RDW: 15.9 % — ABNORMAL HIGH (ref 11.0–15.0)
WBC: 6.5 10*3/uL (ref 3.8–10.8)

## 2016-10-14 LAB — BASIC METABOLIC PANEL
BUN: 15 mg/dL (ref 7–25)
CO2: 23 mmol/L (ref 20–31)
CREATININE: 0.98 mg/dL (ref 0.70–1.25)
Calcium: 8.2 mg/dL — ABNORMAL LOW (ref 8.6–10.3)
Chloride: 105 mmol/L (ref 98–110)
GLUCOSE: 92 mg/dL (ref 65–99)
POTASSIUM: 4.3 mmol/L (ref 3.5–5.3)
Sodium: 135 mmol/L (ref 135–146)

## 2016-10-14 MED ORDER — SODIUM CHLORIDE 0.9 % IV SOLN: 1.0000 g | INTRAVENOUS | Status: DC

## 2016-10-14 NOTE — Progress Notes (Signed)
[facility] for Infectious Disease  Reason for Consult: Osteomyelitis of right foot Referring Physician: Dr. Celesta Gentile  Patient Active Problem List   Diagnosis Date Noted  . Osteomyelitis of right foot ([place]) 10/13/2016    Priority: High  . Parkinson's disease ([place]) 10/13/2016  . Rheumatoid arthritis ([place]) 10/23/2014  . ED (erectile dysfunction) 04/20/2012  . 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 GERD 07/01/2007  . SEIZURE DISORDER 07/01/2007  . Dyslipidemia 06/29/2007    Patient's Medications  New Prescriptions   ERTAPENEM 1 G IN SODIUM CHLORIDE 0.9 % 50 ML    Inject 1 g into the vein daily.  Previous Medications   HYDROCODONE-ACETAMINOPHEN (NORCO/VICODIN) 5-325 MG TABLET    Take 1 tablet by mouth every 6 (six) hours as needed.   IBUPROFEN (ADVIL,MOTRIN) 800 MG TABLET    Take 800 mg by mouth as needed.   LEFLUNOMIDE (ARAVA) 20 MG TABLET    Take 20 mg by mouth daily.    MULTIPLE VITAMINS-MINERALS (CENTRUM SILVER PO)    Take by mouth daily.     OMEGA-3 FATTY ACIDS (FISH OIL) 1000 MG CAPS    Take by mouth.   OMEPRAZOLE (PRILOSEC) 20 MG CAPSULE    Take 20 mg by mouth daily.     OXYCODONE-ACETAMINOPHEN (PERCOCET/ROXICET) 5-325 MG TABLET    Take 1 tablet by mouth every 4 (four) hours as needed for severe pain.   PROMETHAZINE (PHENERGAN) 25 MG TABLET    Take 25 mg by mouth every 8 (eight) hours as needed for nausea or vomiting.   ROTIGOTINE (NEUPRO) 4 MG/24HR    Place 1 patch onto the skin daily.  Modified Medications   No medications on file  Discontinued Medications   CIPROFLOXACIN (CIPRO) 500 MG TABLET    Take 1 tablet (500 mg total) by mouth 2 (two) times daily.   DOXYCYCLINE (ADOXA) 100 MG TABLET    Take 100 mg by mouth 2 (two) times daily.   DOXYCYCLINE (VIBRA-TABS) 100 MG TABLET    Take 1 tablet (100 mg total) by mouth 2 (two) times daily.   ROTIGOTINE (NEUPRO) 2 MG/24HR    Place 1 patch onto the skin daily.    Recommendations: 1. PICC  placement 2. CBC, BMP, sedimentation rate and C-reactive protein 3. Start ertapenem 1 g IV daily 4. Discontinue ciprofloxacin and doxycycline 5. Follow-up here on 11/11/2016   Assessment: Samuel Moran has acute and chronic osteomyelitis of his right foot that has only partially responded to 2 surgeries and oral antibiotic therapy. I talked him about options including continuing his current antibiotics, broadening oral antibiotic therapy or switching to IV ertapenem. After much discussion we chose the latter option. He currently plans on being out of town on vacation from July 14 to July 23. I will see him back prior to his departure to determine if he will continue IV antibiotics while away or switch back to an oral regimen at that time.   HPI: Samuel Moran is a 71 y.o. male with rheumatoid arthritis and Parkinson's disease. He suffered a traumatic amputation of his right great toe when he was 71 years old when a lawnmower ran over his foot. He had no problems healing after that event. He has had some "flares" over the past few years when he would have intermittent swelling of his right foot but these were attributed to his rheumatoid arthritis. He had developed a large callus on the plantar surface of his right foot and  he began to note swelling and redness in early March. He began to develop drainage from a small ulcer in the callus and was seen at [facility] urgent care on 07/06/2016. The area was debrided and a swab culture was obtained that grew MSSA. He was referred to Dr. Jacqualyn Posey who saw him 2 days later. He was started on ciprofloxacin and clindamycin. He underwent an MRI which revealed osteomyelitis in the first, third and fourth metatarsal heads with associated abscess. He underwent bone biopsy and incision and drainage on 07/23/2016. I do not have the pathology results. No organisms were seen on Gram stain or culture. On 08/11/2016 he was changed to ciprofloxacin and doxycycline and took that  until late April. He had some improvement but he noticed more swelling in mid May. A swab culture on 09/15/2016 grew MSSA again. He underwent incision and drainage with excision of the second through fourth right metatarsal heads. Pathology revealed acute and chronic osteomyelitis. Operative specimen showed no organisms on Gram stain but cultures grew multiple organisms. He has continued ciprofloxacin and doxycycline. A friend who is a nurse has been changing his dressing daily. She says that the open portion of the wound has been getting smaller. There is still a scant amount of yellow-green drainage. He has noted more swelling of his foot recently but attributes that to increase activity.  Review of Systems: Review of Systems  Constitutional: Negative for chills, diaphoresis, fever, malaise/fatigue and weight loss.  Gastrointestinal: Negative for abdominal pain, diarrhea, nausea and vomiting.  Musculoskeletal: Positive for joint pain.  Skin: Negative for rash.      Past Medical History:  Diagnosis Date  . ED (erectile dysfunction)   . GERD 07/01/2007  . 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 PROSTATE SPECIFIC ANTIGEN, ELEVATED 06/29/2007  . Rheumatoid arthritis ([place])   . SEIZURE DISORDER 07/01/2007   one epsiode only  . Spinal stenosis of lumbar region 09/22/2014    Social History  Substance Use Topics  . Smoking status: Light Tobacco Smoker    Types: Cigars  . Smokeless tobacco: Never Used     Comment: 1 cigar a week  . Alcohol use 2.0 oz/week    4 Standard drinks or equivalent per week     Comment: one drink a day (liquor)    Family History  Problem Relation Age of Onset  . Arthritis Mother   . Alcoholism Mother   . Arthritis Father        died after fall/trauma and had TBI   No Known Allergies  OBJECTIVE: Vitals:   10/14/16 0904  BP: 121/79  Pulse: 82  Temp: 97.9 F (36.6 C)  TempSrc: Oral  Weight: 216 lb (98 kg)  Height: 5\' 11"  (1.803 m)   Body mass index is  30.13 kg/m.   Physical Exam  Constitutional: He is oriented to person, place, and time. No distress.  He is in good spirits.  Musculoskeletal:  He has sutures in the surgical incision on the plantar surface of his right foot. The midportion of the incision is open about 1/2 inch. There is some green drainage on the gauze dressing. There is marked swelling around his first metatarsal head. He states this is much worse than normal for him.  Neurological: He is alert and oriented to person, place, and time.  No tremor.  Skin: No rash noted.  Psychiatric: Mood and affect normal.    Microbiology: No results found for this or any previous visit (from the past 240 hour(s)).  Samuel Bickers, MD [facility] [phone] pager   [phone] cell 10/14/2016, 10:00 AM

## 2016-10-15 ENCOUNTER — Other Ambulatory Visit: Payer: Self-pay | Admitting: Internal Medicine

## 2016-10-15 ENCOUNTER — Telehealth: Payer: Self-pay | Admitting: Podiatry

## 2016-10-15 ENCOUNTER — Telehealth: Payer: Self-pay

## 2016-10-15 ENCOUNTER — Telehealth: Payer: Self-pay | Admitting: *Deleted

## 2016-10-15 DIAGNOSIS — M86172 Other acute osteomyelitis, left ankle and foot: Secondary | ICD-10-CM

## 2016-10-15 LAB — SEDIMENTATION RATE: SED RATE: 6 mm/h (ref 0–20)

## 2016-10-15 LAB — C-REACTIVE PROTEIN: CRP: 8.2 mg/L — ABNORMAL HIGH (ref <8.0)

## 2016-10-15 NOTE — Telephone Encounter (Signed)
If he is going to get the PICC line this week then that is fine. If it is not going to be in the next couple of days then I would continue.

## 2016-10-15 NOTE — Telephone Encounter (Signed)
Patient called yesterday when he got home from appointment with Samuel Moran to find out when he would hear back regarding picc line placement. Explained the process and that Samuel Moran, CMA was working on this order and had not heard back from IR yet. He called today and left a voice mail stating he needed to know about the picc line because he was almost out of oral antibiotics and he did not want to refill more due to cost, if he was going to be getting a picc line soon. I called [facility] and got Samuel Moran's voice mail. I called [phone] and explained the situation and Samuel Moran was able to schedule the patient for tomorrow at 11:30 AM. I called the patient back and he said he could not go tomorrow because he has an appt with the podiatrist. I gave Samuel Moran the # [phone] for IR scheduling, so that he could call them and arrange a time convenient for him.

## 2016-10-15 NOTE — Telephone Encounter (Signed)
Patient called about the two antibiotics he is on. Stated he is out of one and only has two days worth left on the other. Stated he is supposed to be getting a pick line soon but has not heard from infectious disease. Wants to know if he should spend the money to refill the antibiotics or wait for the pick line. Requested a call back at [phone].

## 2016-10-15 NOTE — Telephone Encounter (Signed)
Called Samuel Moran in IR to schedule PICC placement for Pt. No answer, left a second voicemail to have my call returned to get Pt scheduled. Pt has called several times for appointment. Appointment was made by another nurse earlier today and Pt declined appointment time and date.

## 2016-10-16 ENCOUNTER — Other Ambulatory Visit: Payer: Self-pay | Admitting: Internal Medicine

## 2016-10-16 ENCOUNTER — Ambulatory Visit (INDEPENDENT_AMBULATORY_CARE_PROVIDER_SITE_OTHER): Payer: Medicare HMO | Admitting: Podiatry

## 2016-10-16 ENCOUNTER — Encounter: Payer: Self-pay | Admitting: Podiatry

## 2016-10-16 ENCOUNTER — Telehealth: Payer: Self-pay | Admitting: *Deleted

## 2016-10-16 ENCOUNTER — Ambulatory Visit (HOSPITAL_COMMUNITY)
Admission: RE | Admit: 2016-10-16 | Discharge: 2016-10-16 | Disposition: A | Discharge status: Home / Self Care | Payer: Medicare HMO | Source: Ambulatory Visit | Attending: Internal Medicine | Admitting: Internal Medicine

## 2016-10-16 DIAGNOSIS — Z452 Encounter for adjustment and management of vascular access device: Secondary | ICD-10-CM | POA: Diagnosis not present

## 2016-10-16 DIAGNOSIS — M86172 Other acute osteomyelitis, left ankle and foot: Secondary | ICD-10-CM

## 2016-10-16 DIAGNOSIS — M869 Osteomyelitis, unspecified: Secondary | ICD-10-CM | POA: Insufficient documentation

## 2016-10-16 DIAGNOSIS — Z8739 Personal history of other diseases of the musculoskeletal system and connective tissue: Secondary | ICD-10-CM | POA: Diagnosis not present

## 2016-10-16 DIAGNOSIS — M216X1 Other acquired deformities of right foot: Secondary | ICD-10-CM

## 2016-10-16 HISTORY — PX: IR US GUIDE VASC ACCESS RIGHT: IMG2390

## 2016-10-16 HISTORY — PX: IR FLUORO GUIDE CV LINE RIGHT: IMG2283

## 2016-10-16 MED ORDER — HEPARIN SOD (PORK) LOCK FLUSH 100 UNIT/ML IV SOLN
INTRAVENOUS | Status: AC
  Filled 2016-10-16: qty 5

## 2016-10-16 MED ORDER — HEPARIN SOD (PORK) LOCK FLUSH 100 UNIT/ML IV SOLN
INTRAVENOUS | Status: DC | PRN
  Administered 2016-10-16: 500 [IU] via INTRAVENOUS

## 2016-10-16 MED ORDER — LIDOCAINE HCL (PF) 1 % IJ SOLN
INTRAMUSCULAR | Status: DC | PRN
  Administered 2016-10-16: 10 mL

## 2016-10-16 MED ORDER — LIDOCAINE HCL 1 % IJ SOLN
INTRAMUSCULAR | Status: AC
  Filled 2016-10-16: qty 20

## 2016-10-16 NOTE — Telephone Encounter (Signed)
Patient got his PICC today, but not first dose of IV antibiotics. Pt called to see what the next step would be and when he starts medication. RN found written orders for [facility], faxed with face sheet, insurance and office note.  They will be able to assume care on Saturday 6/16. RN called short stay, asking for first dose to be administered Friday 6/15 if at all possible.  Short Stay was already closed, RN found orders and faxed them to [phone], will follow up with call 6/15 am.   Patient was updated, RN will follow up 6/15. Landis Gandy, RN

## 2016-10-16 NOTE — Telephone Encounter (Signed)
Left message informing pt, Dr. Jacqualyn Posey stated if going to have PICC line place this week would be okay not to have the antibiotics, but if not being placed until later would need to continue the antibiotics, I asked for call back with PICC status.

## 2016-10-16 NOTE — Procedures (Signed)
Pre procedural Diagnosis: Poor venous access Post Procedural Diagnosis: Same  Successful placement of right brachial vein approach 36 cm single lumen PICC line with tip at the superior caval-atrial junction.    EBL: None  No immediate post procedural complication.  The PICC line is ready for immediate use.  Jay Zannie Locastro, MD Pager #: [phone]   

## 2016-10-17 ENCOUNTER — Telehealth: Payer: Self-pay | Admitting: *Deleted

## 2016-10-17 ENCOUNTER — Ambulatory Visit (HOSPITAL_COMMUNITY)
Admission: RE | Admit: 2016-10-17 | Discharge: 2016-10-17 | Disposition: A | Discharge status: Home / Self Care | Payer: Medicare HMO | Source: Ambulatory Visit | Attending: Internal Medicine | Admitting: Internal Medicine

## 2016-10-17 DIAGNOSIS — M868X7 Other osteomyelitis, ankle and foot: Secondary | ICD-10-CM | POA: Insufficient documentation

## 2016-10-17 MED ORDER — HEPARIN SOD (PORK) LOCK FLUSH 100 UNIT/ML IV SOLN
INTRAVENOUS | Status: AC
  Filled 2016-10-17: qty 5

## 2016-10-17 MED ORDER — HEPARIN SOD (PORK) LOCK FLUSH 100 UNIT/ML IV SOLN
250.0000 [IU] | Freq: Once | INTRAVENOUS | Status: AC
  Administered 2016-10-17: 250 [IU] via INTRAVENOUS

## 2016-10-17 MED ORDER — SODIUM CHLORIDE 0.9 % IV SOLN
1.0000 g | Freq: Once | INTRAVENOUS | Status: AC
  Administered 2016-10-17: 1 g via INTRAVENOUS
  Filled 2016-10-17: qty 1

## 2016-10-17 NOTE — Telephone Encounter (Signed)
Patient called with concerns that he has not heard from [facility] and he believes he needs a dose of antibiotic today. He already received his first dose of Ertapenem at short stay today at 1:00 pm. I advised the patient that Stanton Kidney at [facility] had called him earlier today and left a voice mail. He said that is not possible, as he has been sitting by both his phones all day. I called [facility] and spoke to [facility] and she stated she did in fact leave him a voice mail and the phone # she had is correct. She will give patient a call back and she stated that [facility] will deliver his IV antibiotic today for tomorrows dose. Myrtis Hopping

## 2016-10-17 NOTE — Telephone Encounter (Signed)
Patient will get his first dose ertapenem at Short Stay 12:00 today. [facility] will see patient Saturday for teaching, medication delivery.  Patient aware, understands plan. [facility] and Short Stay notified.

## 2016-10-18 DIAGNOSIS — Z452 Encounter for adjustment and management of vascular access device: Secondary | ICD-10-CM | POA: Diagnosis not present

## 2016-10-18 DIAGNOSIS — M069 Rheumatoid arthritis, unspecified: Secondary | ICD-10-CM | POA: Diagnosis not present

## 2016-10-18 DIAGNOSIS — S98111D Complete traumatic amputation of right great toe, subsequent encounter: Secondary | ICD-10-CM | POA: Diagnosis not present

## 2016-10-18 DIAGNOSIS — G40909 Epilepsy, unspecified, not intractable, without status epilepticus: Secondary | ICD-10-CM | POA: Diagnosis not present

## 2016-10-18 DIAGNOSIS — M86171 Other acute osteomyelitis, right ankle and foot: Secondary | ICD-10-CM | POA: Diagnosis not present

## 2016-10-18 DIAGNOSIS — B0222 Postherpetic trigeminal neuralgia: Secondary | ICD-10-CM | POA: Diagnosis not present

## 2016-10-19 NOTE — Progress Notes (Signed)
Subjective: Samuel Moran is a 71 y.o. is seen today in office s/p right foot I&D, metatarsal head excision of 2-4 preformed on 09/24/2016 history and continues to change the packing daily with saline wet-to-dry packing the wound. His findings continues to change the bandage and there is still a small amount of drainage coming from the area however much improved. She states that she cannot pack the area very deep. The posterior that he has numbness feet quite a bit more while waxing the car as well as changing light bulbs in the house. He is scheduled for a PICC today at the hospital. Since doing the season is an increase in swelling to the foot but this is much improved today than it has been. He has no pain. Denies systemic complaints such as fevers, chills, nausea, vomiting. No calf pain, chest pain, shortness of breath.  Objective: General: No acute distress, AAOx3  DP/PT pulses palpable 2/4, CRT < 3 sec to all digits.  Protective sensation intact. Motor function intact.  Right foot: Wound continues on the plantar aspect of the right foot sub metatarsal 3. The wound does probe approximately 1 cm however unable to probe to bone. Status post metatarsal head excision 2 through 4 with decreased plantar pressures. There is no surrounding erythema, ascending cellulitis from the wound. There is no pus expressed however small wound of clear bloody drainage is expressed. Overall the wound is healing.  No other areas of tenderness to bilateral lower extremities. Mild increase in swelling compared to his last appointment but there is no erythema or increase in warmth. No other open lesions or pre-ulcerative lesions.  No pain with calf compression, swelling, warmth, erythema.   Assessment and Plan:  Status post right foot surgery, mild increase in swelling likely due to overactivity.  -Treatment options discussed including all alternatives, risks, and complications -Continue with saline wet-to-dry  dressing packing to the wound daily. Also continue with interbody expert infectious disease. He is scheduled for PICC line today. -Elevation. -Continue surgical shoe with a heel to take pressure off the forefoot plantarly. -Monitor for any clinical signs or symptoms of infection and directed to call the office immediately should any occur or go to the ER. -Follow-up in 1 week or sooner if any problems arise. In the meantime, encouraged to call the office with any questions, concerns, change in symptoms.   Celesta Gentile, DPM

## 2016-10-20 NOTE — Telephone Encounter (Signed)
Checked in with patient today to see how [place] set up went and his IV antibiotics are going. Patient states things are going well, "other than a little bit of diarrhea."  He is able to eat and drink, will notify us if the diarrhea increases. He states he doesn't know when his nurse is coming back out for labs. He will call the number she left when she came Saturday for his first visit. He has no other questions at this time.

## 2016-10-22 DIAGNOSIS — M86171 Other acute osteomyelitis, right ankle and foot: Secondary | ICD-10-CM | POA: Diagnosis not present

## 2016-10-22 DIAGNOSIS — B0222 Postherpetic trigeminal neuralgia: Secondary | ICD-10-CM | POA: Diagnosis not present

## 2016-10-22 DIAGNOSIS — Z452 Encounter for adjustment and management of vascular access device: Secondary | ICD-10-CM | POA: Diagnosis not present

## 2016-10-24 ENCOUNTER — Ambulatory Visit (INDEPENDENT_AMBULATORY_CARE_PROVIDER_SITE_OTHER): Payer: Medicare HMO

## 2016-10-24 ENCOUNTER — Ambulatory Visit (INDEPENDENT_AMBULATORY_CARE_PROVIDER_SITE_OTHER): Payer: Self-pay | Admitting: Podiatry

## 2016-10-24 DIAGNOSIS — M216X1 Other acquired deformities of right foot: Secondary | ICD-10-CM

## 2016-10-24 DIAGNOSIS — M869 Osteomyelitis, unspecified: Secondary | ICD-10-CM

## 2016-10-24 NOTE — Progress Notes (Signed)
Subjective: Samuel Moran is a 71 y.o. is seen today in office s/p right foot I&D, metatarsal head excision of 2-4 preformed on 09/24/2016 history and continues to change the packing daily with saline wet-to-dry packing the wound. His friend states that he is doing well she has notany pus coming from the area. The swelling is somewhat improved and he denies any pain. Denies any increase in redness or any red streaks. His continue with IV antibiotics. This is scheduled in July 10 he believes.Denies systemic complaints such as fevers, chills, nausea, vomiting. No calf pain, chest pain, shortness of breath.  Objective: General: No acute distress, AAOx3  DP/PT pulses palpable 2/4, CRT < 3 sec to all digits.  Protective sensation intact. Motor function intact.  Right foot: Wound continues on the plantar aspect of the right foot sub metatarsal 3. The wound does probe approximately 0.5 cm however unable to probe to bone. Status post metatarsal head excision 2 through 4 with decreased plantar pressures. There is no surrounding erythema, ascending cellulitis from the wound. There is no drainage or pus expressed . Overall the wound is healing.  No other areas of tenderness to bilateral lower extremities. Mild increase in swelling compared to his last appointment but there is no erythema or increase in warmth. No other open lesions or pre-ulcerative lesions.  No pain with calf compression, swelling, warmth, erythema.   Assessment and Plan:  Status post right foot surgery, healing ulcer  -Treatment options discussed including all alternatives, risks, and complications -X-rays were obtained and reviewed with the patient. Status post metatarsal head excision 2 through 5. Chronic subluxation/dislocation of the digits. No soft tissue emphysema is present. No definitive evidence of also myelitis how there is some remodeling to the distal aspect of the second metatarsal remnant. -Continue with iodoform dressing  packing to the wound daily.  -Continue surgical shoe with offloading. -IV antibiotics infectious disease. -Elevation. -Continue surgical shoe with a heel to take pressure off the forefoot plantarly. -I remolded his right foot for a new insert/possible custom shoe. Will discuss with Rick.  -Monitor for any clinical signs or symptoms of infection and directed to call the office immediately should any occur or go to the ER. -Follow-up in 1 week or sooner if any problems arise. In the meantime, encouraged to call the office with any questions, concerns, change in symptoms.   Celesta Gentile, DPM

## 2016-10-28 DIAGNOSIS — B0222 Postherpetic trigeminal neuralgia: Secondary | ICD-10-CM | POA: Diagnosis not present

## 2016-10-28 DIAGNOSIS — M86171 Other acute osteomyelitis, right ankle and foot: Secondary | ICD-10-CM | POA: Diagnosis not present

## 2016-10-28 DIAGNOSIS — Z452 Encounter for adjustment and management of vascular access device: Secondary | ICD-10-CM | POA: Diagnosis not present

## 2016-10-30 ENCOUNTER — Ambulatory Visit (INDEPENDENT_AMBULATORY_CARE_PROVIDER_SITE_OTHER): Payer: Medicare HMO | Admitting: Podiatry

## 2016-10-30 ENCOUNTER — Encounter: Payer: Self-pay | Admitting: Podiatry

## 2016-10-30 VITALS — BP 146/85 | HR 72

## 2016-10-30 DIAGNOSIS — M869 Osteomyelitis, unspecified: Secondary | ICD-10-CM

## 2016-10-30 DIAGNOSIS — M216X1 Other acquired deformities of right foot: Secondary | ICD-10-CM

## 2016-11-02 NOTE — Progress Notes (Signed)
Subjective: Samuel Moran is a 71 y.o. is seen today in office s/p right foot I&D, metatarsal head excision of 2-4 preformed on 09/24/2016. He states that he believes the wound is closed and he has not been putting a bandage over the wound. He has also returned wearing a regular shoe. Today he does admit that he has been wearing a regular shoe intermittently for the last month. He denies any drainage or pus or any redness or warmth the foot. Denies any red streaks. His excellent bone to the gym and return to normal activities. He does continue with IV antibiotics and has a PICC line which is scheduled for another 2 weeks. Denies systemic complaints such as fevers, chills, nausea, vomiting. No calf pain, chest pain, shortness of breath.  Objective: General: No acute distress, AAOx3  DP/PT pulses palpable 2/4, CRT < 3 sec to all digits.  Protective sensation intact. Motor function intact.  Right foot: Hyperkeratotic lesion submetatarsal 3 area. Upon debridement the wound appears to be healed and there is no drainage or pus expressed. There is no surrounding erythema, ascending synovitis. There is no fluctuance, crepitus, malodor. There is mild-to-moderate edema which continues he again states the swelling has been ongoing for several years and this less than what it has been. The swelling has improved compared to last appointment. There is equal temperature gradient bilaterally. No other open lesions or pre-ulcerative lesions.  No pain with calf compression, swelling, warmth, erythema.   Assessment and Plan:  Status post right foot surgery, healed wound  -Treatment options discussed including all alternatives, risks, and complications -At this time I debrided hyperkeratotic lesion and underlying wound appears to be healed. However as it is" still like him to remain in a surgical shoe until the area heals a little bit more to prevent any further breakdown. He presents today wearing a regular sneaker.  Given the deformity to his foot and the chronic swelling at the cuboid benefit more from a custom shoe however he does not want to do this but did have brisk talked to him about it as well. -Finish course of IV antibiotics per infectious disease -Would continue at least a Band-Aid or something, covering over the wound for some protection as he continues to heal. -Monitor for any clinical signs or symptoms of infection and directed to call the office immediately should any occur or go to the ER. -RTC 2 weeks or sooner if needed.   Celesta Gentile, DPM

## 2016-11-04 DIAGNOSIS — B0222 Postherpetic trigeminal neuralgia: Secondary | ICD-10-CM | POA: Diagnosis not present

## 2016-11-04 DIAGNOSIS — M86171 Other acute osteomyelitis, right ankle and foot: Secondary | ICD-10-CM | POA: Diagnosis not present

## 2016-11-04 DIAGNOSIS — Z452 Encounter for adjustment and management of vascular access device: Secondary | ICD-10-CM | POA: Diagnosis not present

## 2016-11-11 ENCOUNTER — Telehealth: Payer: Self-pay

## 2016-11-11 DIAGNOSIS — M86171 Other acute osteomyelitis, right ankle and foot: Secondary | ICD-10-CM | POA: Diagnosis not present

## 2016-11-11 DIAGNOSIS — B0222 Postherpetic trigeminal neuralgia: Secondary | ICD-10-CM | POA: Diagnosis not present

## 2016-11-11 DIAGNOSIS — Z452 Encounter for adjustment and management of vascular access device: Secondary | ICD-10-CM | POA: Diagnosis not present

## 2016-11-11 NOTE — Telephone Encounter (Signed)
Per Dr Megan Salon ok to pull PICC today.  [facility] informed.   Laverle Patter, RN

## 2016-11-11 NOTE — Telephone Encounter (Signed)
Home Health is calling to see if PICC needs to be removed.  Patient has completed his IV antibiotics.   Please advise.   Laverle Patter, RN

## 2016-11-13 ENCOUNTER — Ambulatory Visit (INDEPENDENT_AMBULATORY_CARE_PROVIDER_SITE_OTHER): Payer: Self-pay | Admitting: Podiatry

## 2016-11-13 ENCOUNTER — Encounter: Payer: Self-pay | Admitting: Podiatry

## 2016-11-13 DIAGNOSIS — M869 Osteomyelitis, unspecified: Secondary | ICD-10-CM

## 2016-11-13 DIAGNOSIS — M216X1 Other acquired deformities of right foot: Secondary | ICD-10-CM

## 2016-11-13 NOTE — Progress Notes (Signed)
Subjective: Samuel Moran is a 71 y.o. is seen today in office s/p right foot I&D, metatarsal head excision of 2-4 preformed on 09/24/2016. Since last appointment his PICC line has been removed. He states that he is doing well he's having no issues with his feet he feels of the wound is completely healed he has not had any increase in swelling or redness. Overall the swelling he states is improved. He's been wearing a regular shoe with an over-the-counter insert as the custom insert has not been helping. He feels that this custom insert was too bulky for her shoes. Denies systemic complaints such as fevers, chills, nausea, vomiting. No calf pain, chest pain, shortness of breath.  Objective: General: No acute distress, AAOx3  DP/PT pulses palpable 2/4, CRT < 3 sec to all digits.  Protective sensation intact. Motor function intact.  Right foot: Hyperkeratotic lesion submetatarsal 3 area. Upon debridement there is no underlying ulceration, drainage or any clinical signs of infection. Decrease edema to the foot and is no erythema or increase in warmth. Overall the foot appears to be doing well and there is no other open lesions or pre-ulcerative lesions. There is no clinical signs of infection noted today. No other open lesions or pre-ulcerative lesions.  No pain with calf compression, swelling, warmth, erythema.   Assessment and Plan:  Status post right foot surgery, healed wound  -Treatment options discussed including all alternatives, risks, and complications -At this point he is doing well. He is finish his course of IV antibiotics and PICC line has been removed. He is been wearing a regular shoe there is no open sore identified. I debrided the hyperkeratotic tissue today to make sure there is no underlying wound. There is minimal hyperkeratotic tissue present. At this point discussed and monitor his feet daily. Gradually increase activity level. At this point we'll discharge him from the  postoperative cares is doing well but I encouraged to call any questions or concerns or any change in symptoms.  Celesta Gentile, DPM

## 2016-11-19 ENCOUNTER — Ambulatory Visit (INDEPENDENT_AMBULATORY_CARE_PROVIDER_SITE_OTHER): Payer: Medicare HMO | Admitting: Internal Medicine

## 2016-11-19 ENCOUNTER — Encounter: Payer: Self-pay | Admitting: Internal Medicine

## 2016-11-19 DIAGNOSIS — M86271 Subacute osteomyelitis, right ankle and foot: Secondary | ICD-10-CM | POA: Diagnosis not present

## 2016-11-19 NOTE — Progress Notes (Signed)
[facility]  Patient Active Problem List   Diagnosis Date Noted  . 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 Parkinson's disease ([place]) 10/13/2016  . Rheumatoid arthritis ([place]) 10/23/2014  . ED (erectile dysfunction) 04/20/2012  . 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 GERD 07/01/2007  . SEIZURE DISORDER 07/01/2007  . Dyslipidemia 06/29/2007    Patient's Medications  New Prescriptions   No medications on file  Previous Medications   HYDROCODONE-ACETAMINOPHEN (NORCO/VICODIN) 5-325 MG TABLET    Take 1 tablet by mouth every 6 (six) hours as needed.   IBUPROFEN (ADVIL,MOTRIN) 800 MG TABLET    Take 800 mg by mouth as needed.   LEFLUNOMIDE (ARAVA) 20 MG TABLET    Take 20 mg by mouth daily.    MULTIPLE VITAMINS-MINERALS (CENTRUM SILVER PO)    Take by mouth daily.     OMEGA-3 FATTY ACIDS (FISH OIL) 1000 MG CAPS    Take by mouth.   OMEPRAZOLE (PRILOSEC) 20 MG CAPSULE    Take 20 mg by mouth daily.     OXYCODONE-ACETAMINOPHEN (PERCOCET/ROXICET) 5-325 MG TABLET    Take 1 tablet by mouth every 4 (four) hours as needed for severe pain.   PROMETHAZINE (PHENERGAN) 25 MG TABLET    Take 25 mg by mouth every 8 (eight) hours as needed for nausea or vomiting.   ROTIGOTINE (NEUPRO) 4 MG/24HR    Place 1 patch onto the skin daily.  Modified Medications   No medications on file  Discontinued Medications   CIPROFLOXACIN (CIPRO) 500 MG TABLET    Take 500 mg by mouth 2 (two) times daily.   DOXYCYCLINE (VIBRA-TABS) 100 MG TABLET    Take 100 mg by mouth 2 (two) times daily.   ERTAPENEM 1 G IN SODIUM CHLORIDE 0.9 % 50 ML    Inject 1 g into the vein daily.    Subjective: Samuel Moran is in for his routine follow-up visit. He completed 4 weeks of IV ertapenem for his polymicrobial it right foot metatarsal osteomyelitis 1 week ago. He had no problems tolerating his PICC or ertapenem. He is feeling much better. His surgical incision has healed. He is back  in the gym exercising regularly.  Review of Systems: Review of Systems  Constitutional: Negative for chills, diaphoresis and fever.  Gastrointestinal: Negative for abdominal pain, diarrhea, nausea and vomiting.  Musculoskeletal: Negative for joint pain.    Past Medical History:  Diagnosis Date  . ED (erectile dysfunction)   . GERD 07/01/2007  . 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 PROSTATE SPECIFIC ANTIGEN, ELEVATED 06/29/2007  . Rheumatoid arthritis ([place])   . SEIZURE DISORDER 07/01/2007   one epsiode only  . Spinal stenosis of lumbar region 09/22/2014    Social History  Substance Use Topics  . Smoking status: Light Tobacco Smoker    Types: Cigars  . Smokeless tobacco: Never Used     Comment: 1 cigar a week  . Alcohol use 2.0 oz/week    4 Standard drinks or equivalent per week     Comment: one drink a day (liquor)    Family History  Problem Relation Age of Onset  . Arthritis Mother   . Alcoholism Mother   . Arthritis Father        died after fall/trauma and had TBI    No Known Allergies  Objective: Vitals:   11/19/16 1547  BP: 137/78  Pulse: 82  Temp: 98.1 F (36.7 C)  TempSrc: Oral  Weight: 215 lb (97.5 kg)   Body mass index is 29.99 kg/m.  Physical Exam  Constitutional: He is oriented to person, place, and time. No distress.  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 He has some bogginess around that area that he states is normal for him. The surgical incision on the bottom of his foot is completely healed. There is no redness, swelling or warmth or other signs suggestive of active infection.  Neurological: He is alert and oriented to person, place, and time.  Skin: No rash noted.  Psychiatric: Mood and affect normal.    Lab Results    Problem List Items Addressed This Visit      High   Osteomyelitis of right foot ([place])    I'm hopeful that his osteomyelitis has been cured. He will stay off of  antibiotics and follow-up here in 6 weeks.          Samuel Bickers, MD [facility] [employer] [phone] pager   [phone] cell 11/19/2016, 4:04 PM

## 2016-11-19 NOTE — Addendum Note (Signed)
Addended by: Reggy Eye on: 11/19/2016 04:06 PM   Modules accepted: Orders

## 2016-11-19 NOTE — Assessment & Plan Note (Signed)
I'm hopeful that his osteomyelitis has been cured. He will stay off of antibiotics and follow-up here in 6 weeks.

## 2016-11-25 NOTE — Progress Notes (Signed)
Samuel Moran was seen today in the movement disorders clinic for neurologic consultation at the request of Samuel Lor, MD.  The consultation is for the evaluation of R hand tremor and gait change x 6 months.  Tremor is noted at rest.  He is R hand dominant.  Tremor is gone when he picks up arm or makes a fist.  Pt states that he takes methotrexate and he noted that after he takes it he will shuffle for a few days and not swing the arms.  Dr. Lenna Moran, therefore, took him off of the medication 3 weeks ago and he feels that walking is a bit better.  The records that were made available to me were reviewed.  Does report hx of seizure in 2008 and was at self checkout and couldn't figure out how to do steps.  Got finished, walked to car and next thing he remembers is being in ambulance.  Apparently, got in car and had death grip on wheel.  EMS got there and he was confused and wouldn't unlock door.  They broke window and took him to hospital.  W/U negative except "white spot" on brain.  03/14/16 update:  Pt returns for f/u much earlier than expected.  Has dx of PD, and last visit he didn't want to start medication.  He decided almost immediately after the visit that he wanted to start medication but he wanted to come back and discuss and discuss the medication in more detail.  Has a myriad of questions today.  Insurance company denied authorization for his MRI brain.  07/03/16 update:  Patient follows up today.  I started him on pramipexole last visit.  He called me in February to state that he had no side effects with medication, but his friends brother-in-law had a sleep attack on the medication and he was concerned about the potential side effect.  We had discussed this potential side effect previously.  Since he was side effect free, I told him that I would recommend that he continue on the medication.  2 weeks later he called to tell me that he was sleeping all of the time on the medication.    Today he states that it was really sleepiness but "spaciness" and "I wasn't all there."   I told him just to discontinue the medication and we could talk about it at follow-up.  I did get a note from his rheumatologist, Dr. Trudie Moran, dated 06/30/2016 that stated that the patient had an acute rheumatoid arthritis flareup.  She thought that it was because he discontinued his methotrexate.  The patient did that because he thought methotrexate was causing dizziness, but she did not think that was the case and thought it was the fact that he was taking a different version of folic acid.  She restarted methotrexate and folic acid.  Thinks that he is moving around okay short of the RA flare.  He is riding the bike for 10 min 3 times a week.  No falls.  No lightheadedness or near syncope.  Mood has been okay.    09/03/16 update:  Patient seen today in follow-up.  He is not on any Parkinson's medication, which was his desire.  He got off of methotrexate, which he felt caused side effects.  He is now on leflunomide and would like to get back on medications for Parkinson's disease.  I just got a note from his rheumatologist that the patient wanted to wait on restarting methotrexate until  he started his Parkinson's medicine.  The reason we waited last visit to restart his Parkinson's medicine was because he was getting started back on rheumatoid medicine.  He brings medication pamphlet on rheumatoid meds he is considering so that meds we choose don't interfere.   The patient is having more trouble with tremor and arm swing.  He has not had any falls.  He has not had any lightheadedness or near syncope.  He denies significant depression.  12/09/16 update:  Patient seen in follow-up.  He was started on Neupro last visit.  He is now on 4 mg daily.  He has had no sleep attacks.  No compulsive behaviors.  No site rxn's. He thinks that it controls tremor well.  He did miss the medication on Sunday and then noted more tremor on Monday.   He had one fall in [place] but he caught his foot on a cushion; he didn't get hurt.  Pt denies lightheadedness, near syncope.  No hallucinations.  Mood has been good.  He is exercising on the stationary bike in the gym 4 days a week (11 min per time).      PREVIOUS MEDICATIONS: Pramipexole (patient had a friend who had a sleep attack on the medication and subsequently the patient developed sleepiness on the medication and we decided to discontinue)  ALLERGIES:  No Known Allergies  CURRENT MEDICATIONS:  Outpatient Encounter Prescriptions as of 12/09/2016  Medication Sig  . ibuprofen (ADVIL,MOTRIN) 800 MG tablet Take 800 mg by mouth as needed.  . leflunomide (ARAVA) 20 MG tablet Take 20 mg by mouth daily.   . Multiple Vitamins-Minerals (CENTRUM SILVER PO) Take by mouth daily.    . Omega-3 Fatty Acids (FISH OIL) 1000 MG CAPS Take by mouth.  Marland Kitchen omeprazole (PRILOSEC) 20 MG capsule Take 20 mg by mouth daily.    . rotigotine (NEUPRO) 4 MG/24HR Place 1 patch onto the skin daily.  . [DISCONTINUED] oxyCODONE-acetaminophen (PERCOCET/ROXICET) 5-325 MG tablet Take 1 tablet by mouth every 4 (four) hours as needed for severe pain.   No facility-administered encounter medications on file as of 12/09/2016.     PAST MEDICAL HISTORY:   Past Medical History:  Diagnosis Date  . ED (erectile dysfunction)   . GERD 07/01/2007  . 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 PROSTATE SPECIFIC ANTIGEN, ELEVATED 06/29/2007  . Rheumatoid arthritis ([place])   . SEIZURE DISORDER 07/01/2007   one epsiode only  . Spinal stenosis of lumbar region 09/22/2014    PAST SURGICAL HISTORY:   Past Surgical History:  Procedure Laterality Date  . AMPUTATION     right great toe - tramatic age 59  . IR FLUORO GUIDE CV LINE RIGHT  10/16/2016  . IR US GUIDE VASC ACCESS RIGHT  10/16/2016    SOCIAL HISTORY:   Social History   Social History  . Marital status: Single    Spouse name: N/A  . Number of children: N/A  . Years of education:  N/A   Occupational History  . Chartered certified accountant    Social History Main Topics  . Smoking status: Light Tobacco Smoker    Types: Cigars  . Smokeless tobacco: Never Used     Comment: 1 cigar a week  . Alcohol use 2.0 oz/week    4 Standard drinks or equivalent per week     Comment: one drink a day (liquor)  . Drug use: No  . Sexual activity: Not on file   Other Topics Concern  . Not on file  Social History Narrative  . No narrative on file    FAMILY HISTORY:   Family Status  Relation Status  . Mother Deceased  . Father Deceased  . Child Alive    ROS:  LBP on the right.  A complete 10 system review of systems was obtained and was unremarkable apart from what is mentioned above.  PHYSICAL EXAMINATION:    VITALS:   Vitals:   12/09/16 0816  BP: 124/76  Pulse: 84  SpO2: 97%  Weight: 214 lb (97.1 kg)  Height: 5\' 11"  (1.803 m)    GEN:  The patient appears stated age and is in NAD. HEENT:  Normocephalic, atraumatic.  The mucous membranes are moist. The superficial temporal arteries are without ropiness or tenderness. CV:  RRR Lungs:  CTAB Neck/HEME:  There are no carotid bruits bilaterally. Derm:  There are nodules over the PIP joints.  No skin site reactions where patches placed  Neurological examination:  Orientation: The patient is alert and oriented x3.  Cranial nerves: There is good facial symmetry. There is facial hypomimia. The visual fields are full to confrontational testing. The speech is fluent and clear.  He is hypophonic.  Soft palate rises symmetrically and there is no tongue deviation. Hearing is intact to conversational tone. Sensation: Sensation is intact to light touch throughout. Motor: Strength is 5/5 in the bilateral upper and lower extremities.   Shoulder shrug is equal and symmetric.  There is no pronator drift.  Movement examination: Tone: There is normal tone in the RUE/LUE and just slightly increased tone in the RLE Abnormal movements: Rare  tremor in the RUE and increased tremor with ambulation in the RUE Coordination:  There is no decremation today, with any form of RAMS, including alternating supination and pronation of the forearm, hand opening and closing, finger taps, heel taps and toe taps. Gait and Station: The patient has minimal difficulty arising out of a deep-seated chair without the use of the hands. The patient's stride length is normal with decreased arm swing bilaterally.  The patient has a negative pull test.      LABS    Chemistry      Component Value Date/Time   NA 135 10/14/2016 1048   NA 139 07/08/2016 1158   K 4.3 10/14/2016 1048   CL 105 10/14/2016 1048   CO2 23 10/14/2016 1048   BUN 15 10/14/2016 1048   BUN 22 07/08/2016 1158   CREATININE 0.98 10/14/2016 1048      Component Value Date/Time   CALCIUM 8.2 (L) 10/14/2016 1048   ALKPHOS 56 07/30/2016 0810   AST 15 07/30/2016 0810   ALT 12 07/30/2016 0810   BILITOT 0.6 07/30/2016 0810     Lab Results  Component Value Date   TSH 1.15 04/17/2015   No results found for: VITAMINB12   ASSESSMENT/PLAN:  1.  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  We discussed treatment options as well as prognostic indicators.  Patient education was provided.  -continue neupro, 4 mg daily.  Discussed r/b/se of the medication extensively  -talked about adding balancing exercises to CV exercise regimen  2.  Rheumatoid arthritis  -seeing Dr. 04/19/2015  -asked me about DIP nodules and told him to ask Dr. Nickola Major  3.  Follow up is anticipated in the next few months, sooner should new neurologic issues arise.   Much greater than 50% of this visit was spent in  counseling and coordinating care.  Total face to face time:  25 min     Cc:  Samuel Lor, MD

## 2016-11-27 DIAGNOSIS — M0579 Rheumatoid arthritis with rheumatoid factor of multiple sites without organ or systems involvement: Secondary | ICD-10-CM | POA: Diagnosis not present

## 2016-11-27 DIAGNOSIS — Z6830 Body mass index (BMI) 30.0-30.9, adult: Secondary | ICD-10-CM | POA: Diagnosis not present

## 2016-11-27 DIAGNOSIS — M2550 Pain in unspecified joint: Secondary | ICD-10-CM | POA: Diagnosis not present

## 2016-11-27 DIAGNOSIS — E669 Obesity, unspecified: Secondary | ICD-10-CM | POA: Diagnosis not present

## 2016-11-27 DIAGNOSIS — Z79899 Other long term (current) drug therapy: Secondary | ICD-10-CM | POA: Diagnosis not present

## 2016-12-09 ENCOUNTER — Ambulatory Visit (INDEPENDENT_AMBULATORY_CARE_PROVIDER_SITE_OTHER): Payer: Medicare HMO | Admitting: Neurology

## 2016-12-09 ENCOUNTER — Encounter: Payer: Self-pay | Admitting: Neurology

## 2016-12-09 VITALS — BP 124/76 | HR 84 | Ht 71.0 in | Wt 214.0 lb

## 2016-12-09 DIAGNOSIS — G20 Parkinson's disease: Secondary | ICD-10-CM | POA: Diagnosis not present

## 2016-12-09 DIAGNOSIS — M069 Rheumatoid arthritis, unspecified: Secondary | ICD-10-CM | POA: Diagnosis not present

## 2016-12-11 ENCOUNTER — Ambulatory Visit (INDEPENDENT_AMBULATORY_CARE_PROVIDER_SITE_OTHER): Payer: Medicare HMO | Admitting: Family

## 2016-12-11 ENCOUNTER — Encounter: Payer: Self-pay | Admitting: Podiatry

## 2016-12-11 ENCOUNTER — Ambulatory Visit (INDEPENDENT_AMBULATORY_CARE_PROVIDER_SITE_OTHER): Payer: Medicare HMO | Admitting: Podiatry

## 2016-12-11 ENCOUNTER — Ambulatory Visit (INDEPENDENT_AMBULATORY_CARE_PROVIDER_SITE_OTHER): Payer: Medicare HMO

## 2016-12-11 VITALS — BP 151/91 | HR 82 | Temp 98.1°F | Resp 18

## 2016-12-11 DIAGNOSIS — L02619 Cutaneous abscess of unspecified foot: Secondary | ICD-10-CM

## 2016-12-11 DIAGNOSIS — R609 Edema, unspecified: Secondary | ICD-10-CM | POA: Diagnosis not present

## 2016-12-11 DIAGNOSIS — M71071 Abscess of bursa, right ankle and foot: Secondary | ICD-10-CM | POA: Diagnosis not present

## 2016-12-11 DIAGNOSIS — L03119 Cellulitis of unspecified part of limb: Secondary | ICD-10-CM | POA: Diagnosis not present

## 2016-12-11 DIAGNOSIS — M869 Osteomyelitis, unspecified: Secondary | ICD-10-CM | POA: Diagnosis not present

## 2016-12-11 DIAGNOSIS — M5441 Lumbago with sciatica, right side: Secondary | ICD-10-CM | POA: Diagnosis not present

## 2016-12-12 LAB — CBC WITH DIFFERENTIAL/PLATELET
BASOS PCT: 0 %
Basophils Absolute: 0 cells/uL (ref 0–200)
EOS PCT: 4 %
Eosinophils Absolute: 272 cells/uL (ref 15–500)
HCT: 38.5 % (ref 38.5–50.0)
Hemoglobin: 12.9 g/dL — ABNORMAL LOW (ref 13.2–17.1)
LYMPHS ABS: 1360 {cells}/uL (ref 850–3900)
Lymphocytes Relative: 20 %
MCH: 27.8 pg (ref 27.0–33.0)
MCHC: 33.5 g/dL (ref 32.0–36.0)
MCV: 83 fL (ref 80.0–100.0)
MONOS PCT: 10 %
MPV: 9.6 fL (ref 7.5–12.5)
Monocytes Absolute: 680 cells/uL (ref 200–950)
NEUTROS ABS: 4488 {cells}/uL (ref 1500–7800)
Neutrophils Relative %: 66 %
PLATELETS: 272 10*3/uL (ref 140–400)
RBC: 4.64 MIL/uL (ref 4.20–5.80)
RDW: 15.8 % — ABNORMAL HIGH (ref 11.0–15.0)
WBC: 6.8 10*3/uL (ref 3.8–10.8)

## 2016-12-13 LAB — SEDIMENTATION RATE: SED RATE: 8 mm/h (ref 0–20)

## 2016-12-14 LAB — CULTURE, ROUTINE-ABSCESS
Gram Stain: NONE SEEN
Gram Stain: NONE SEEN
Organism ID, Bacteria: NO GROWTH

## 2016-12-15 ENCOUNTER — Other Ambulatory Visit: Payer: Self-pay | Admitting: Neurology

## 2016-12-15 ENCOUNTER — Telehealth: Payer: Self-pay

## 2016-12-15 LAB — C-REACTIVE PROTEIN: CRP: 11.7 mg/L — AB (ref <8.0)

## 2016-12-15 NOTE — Progress Notes (Signed)
Subjective: Mr. 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 He states his been ongoing the last week or more. Denies any redness or warmth denies any open sores. After his last appointment with me he did go on vacation and he is been doing more walking. Denies any recent injury or trauma. Denies any systemic complaints such as fevers, chills, nausea, vomiting. No acute changes since last appointment, and no other complaints at this time.   Objective: AAO x3, NAD DP/PT pulses palpable bilaterally, CRT less than 3 seconds Previous surgical site is well-healed there is no hyperkeratotic tissue and there is minimal swelling along this area. The majority swelling appears to be submetatarsal 1. There is no fluctuance or crepitus is no erythema or increase in warmth. There is no clinical signs of infection present. No open lesions or pre-ulcerative lesions.  No pain with calf compression, swelling, warmth, erythema  Assessment: Swelling medial right foot  Plan: -All treatment options discussed with the patient including all alternatives, risks, complications.  -X-rays were obtained and reviewed. No evidence of acute osteomyelitis or soft tissue emphysema present. There was air on x-ray but this is after the aspiration and the areas likely from the procedure. -Due to the swelling I recommended drainage of the area. Utilizing 18-gauge needle and a sterile fashion was able to remove approximately 36 mL of clear/yellow fluid from the area. I sent this for cell count analysis as well as culture. -Order blood work today including ESR, CRP, CBC -We'll hold antibiotic for now there is no clinical signs of infection and look at the results of the culture blood work. -Patient encouraged to call the office with any questions, concerns, change in symptoms.   Celesta Gentile, DPM

## 2016-12-15 NOTE — Telephone Encounter (Signed)
Spoke with patient informing him of normal WBC, and mildly elevated CRP. I told him that we were still waiting on the pathology results and once we received them we would call him and inform him of further care/follow up.

## 2016-12-17 LAB — SYNOVIAL FLUID, CELL COUNT
EOS FL: 0 %
LINING CELLS, SYNOVIAL: 0 %
LYMPHS FL: 3 %
MACROPHAGES FLD: 10 %
Nuc cell # Fld: 2388 cells/uL — ABNORMAL HIGH (ref 0–200)
POLYS FL: 87 %
RBC, Fluid: 4000 /uL

## 2016-12-18 DIAGNOSIS — M5416 Radiculopathy, lumbar region: Secondary | ICD-10-CM | POA: Diagnosis not present

## 2016-12-18 DIAGNOSIS — M48061 Spinal stenosis, lumbar region without neurogenic claudication: Secondary | ICD-10-CM | POA: Diagnosis not present

## 2016-12-26 ENCOUNTER — Telehealth: Payer: Self-pay | Admitting: *Deleted

## 2016-12-26 NOTE — Telephone Encounter (Addendum)
-----   Message from Trula Slade, DPM sent at 12/23/2016  4:27 PM EDT ----- Culture is negative; pathology shows that this is likely more inflammatory and not infectious especially the culture is negative. CRP was elevated but other labs were normal. Please let him know. Thank you.12/26/2016-Left message requesting call for results.01/09/2017-Left message with Dr. Leigh Aurora review of results.

## 2017-01-06 ENCOUNTER — Ambulatory Visit: Payer: Medicare HMO | Admitting: Internal Medicine

## 2017-01-22 ENCOUNTER — Telehealth: Payer: Self-pay | Admitting: Neurology

## 2017-01-22 NOTE — Telephone Encounter (Signed)
Patient called needing to speak with you regarding his Neupro patch. He is having a hard time staying a sleep. He is needing to know can he adjust the time he uses the patch? Please call. Thanks

## 2017-01-22 NOTE — Telephone Encounter (Signed)
Patient wants to move the time of day he is putting on the Neupro patch. I let him know it may not help, but no harm in trying a different time.   He will call with any other questions.

## 2017-01-26 ENCOUNTER — Ambulatory Visit (INDEPENDENT_AMBULATORY_CARE_PROVIDER_SITE_OTHER): Payer: Medicare HMO | Admitting: Internal Medicine

## 2017-01-26 ENCOUNTER — Encounter: Payer: Self-pay | Admitting: Internal Medicine

## 2017-01-26 VITALS — BP 122/76 | HR 88 | Temp 99.2°F | Wt 223.2 lb

## 2017-01-26 DIAGNOSIS — Z23 Encounter for immunization: Secondary | ICD-10-CM

## 2017-01-26 DIAGNOSIS — R69 Illness, unspecified: Secondary | ICD-10-CM | POA: Diagnosis not present

## 2017-01-26 DIAGNOSIS — M0630 Rheumatoid nodule, unspecified site: Secondary | ICD-10-CM | POA: Diagnosis not present

## 2017-01-26 DIAGNOSIS — F5101 Primary insomnia: Secondary | ICD-10-CM

## 2017-01-26 DIAGNOSIS — M05711 Rheumatoid arthritis with rheumatoid factor of right shoulder without organ or systems involvement: Secondary | ICD-10-CM

## 2017-01-26 DIAGNOSIS — G20 Parkinson's disease: Secondary | ICD-10-CM | POA: Diagnosis not present

## 2017-01-26 MED ORDER — ZOLPIDEM TARTRATE 5 MG PO TABS: 5.0000 mg | ORAL_TABLET | Freq: Every evening | ORAL | 1 refills | Status: DC | PRN

## 2017-01-26 NOTE — Progress Notes (Signed)
Subjective:    Patient ID: Samuel Moran, male    DOB: 11/01/1945, 71 y.o.   MRN: [id number]  HPI  71 year old patient who has a history of PD as well as RA. He is seen here today with 2 concerns.  For the past 2 months she has had some issues with insomnia.  He states he usually goes to bed between 9:30 and 10 PM. He does have occasional naps.  He also states he often eats a late meal. He has modest caffeine use in the morning, but modest in the afternoon.  He does usually have a cocktail at dinner time. He has tried melatonin with inconsistent benefit. He does exercise regularly.  He is followed by rheumatology with RA.  He complains of scattered nodules involving several fingers of both hands.  He states that the nodules on his right hand interferes with handwriting.  Nodules are largely asymptomatic. According to the patient, he was told by rheumatology that these were not rheumatoid nodules  Past Medical History:  Diagnosis Date  . ED (erectile dysfunction)   . GERD 07/01/2007  . 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 PROSTATE SPECIFIC ANTIGEN, ELEVATED 06/29/2007  . Rheumatoid arthritis ([place])   . SEIZURE DISORDER 07/01/2007   one epsiode only  . Spinal stenosis of lumbar region 09/22/2014     Social History   Social History  . Marital status: Single    Spouse name: N/A  . Number of children: N/A  . Years of education: N/A   Occupational History  . Chartered certified accountant    Social History Main Topics  . Smoking status: Light Tobacco Smoker    Types: Cigars  . Smokeless tobacco: Never Used     Comment: 1 cigar a week  . Alcohol use 2.0 oz/week    4 Standard drinks or equivalent per week     Comment: one drink a day (liquor)  . Drug use: No  . Sexual activity: Not on file   Other Topics Concern  . Not on file   Social History Narrative  . No narrative on file    Past Surgical History:  Procedure Laterality Date  . AMPUTATION     right great toe - tramatic age 47   . IR FLUORO GUIDE CV LINE RIGHT  10/16/2016  . IR US GUIDE VASC ACCESS RIGHT  10/16/2016    Family History  Problem Relation Age of Onset  . Arthritis Mother   . Alcoholism Mother   . Arthritis Father        died after fall/trauma and had TBI    No Known Allergies  Current Outpatient Prescriptions on File Prior to Visit  Medication Sig Dispense Refill  . ibuprofen (ADVIL,MOTRIN) 800 MG tablet Take 800 mg by mouth as needed.    . Multiple Vitamins-Minerals (CENTRUM SILVER PO) Take by mouth daily.      Marland Kitchen NEUPRO 4 MG/24HR PLACE 1 PATCH ONTO THE SKIN DAILY. 30 patch 5  . Omega-3 Fatty Acids (FISH OIL) 1000 MG CAPS Take by mouth.    Marland Kitchen omeprazole (PRILOSEC) 20 MG capsule Take 20 mg by mouth daily.       No current facility-administered medications on file prior to visit.     BP 122/76 (BP Location: Right Arm, Patient Position: Sitting, Cuff Size: Normal)   Pulse 88   Temp 99.2 F (37.3 C) (Oral) Comment: coffee in last 10 minutes  Wt 223 lb 3.2 oz (101.2 kg)   BMI 31.13 kg/m  .  as   Review of Systems  Constitutional: Negative for appetite change, chills, fatigue and fever.  HENT: Negative for congestion, dental problem, ear pain, hearing loss, sore throat, tinnitus, trouble swallowing and voice change.   Eyes: Negative for pain, discharge and visual disturbance.  Respiratory: Negative for cough, chest tightness, wheezing and stridor.   Cardiovascular: Negative for chest pain, palpitations and leg swelling.  Gastrointestinal: Negative for abdominal distention, abdominal pain, blood in stool, constipation, diarrhea, nausea and vomiting.  Genitourinary: Negative for difficulty urinating, discharge, flank pain, genital sores, hematuria and urgency.  Musculoskeletal: Negative for arthralgias, back pain, gait problem, joint swelling, myalgias and neck stiffness.  Skin: Negative for rash.       Multiple nodules involving fingers of both hands  Neurological: Positive for tremors.  Negative for dizziness, syncope, speech difficulty, weakness, numbness and headaches.  Hematological: Negative for adenopathy. Does not bruise/bleed easily.  Psychiatric/Behavioral: Positive for sleep disturbance. Negative for behavioral problems and dysphoric mood. The patient is not nervous/anxious.        Objective:   Physical Exam  Constitutional: He appears well-developed and well-nourished. No distress.  Blood pressure nicely controlled  Neurological:  Tremor right hand  Skin:  Multiple nodules involving several fingers of both hands, nontender ranging in diameter from 1-2 cm          Assessment & Plan:   Probable pulmonary nodules.  Patient desires referral to a hand surgeon for definitive diagnosis and treatment Insomnia.  Sleep hygiene issues discussed.  Prescription for limited dose Ambien refilled.  The book, "why we sleep"recommended  Nyoka Cowden

## 2017-01-26 NOTE — Patient Instructions (Addendum)

## 2017-02-03 ENCOUNTER — Ambulatory Visit (INDEPENDENT_AMBULATORY_CARE_PROVIDER_SITE_OTHER): Payer: Medicare HMO | Admitting: Internal Medicine

## 2017-02-03 ENCOUNTER — Encounter: Payer: Self-pay | Admitting: Internal Medicine

## 2017-02-03 DIAGNOSIS — M86171 Other acute osteomyelitis, right ankle and foot: Secondary | ICD-10-CM

## 2017-02-03 NOTE — Progress Notes (Signed)
Samuel Moran for Infectious Disease  Patient Active Problem List   Diagnosis Date Noted  . Osteomyelitis of right foot ([place]) 10/13/2016    Priority: High  . Parkinson's disease ([place]) 10/13/2016  . Rheumatoid arthritis ([place]) 10/23/2014  . ED (erectile dysfunction) 04/20/2012  . 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 GERD 07/01/2007  . SEIZURE DISORDER 07/01/2007  . Dyslipidemia 06/29/2007    Patient's Medications  New Prescriptions   No medications on file  Previous Medications   IBUPROFEN (ADVIL,MOTRIN) 800 MG TABLET    Take 800 mg by mouth as needed.   LEFLUNOMIDE (ARAVA) 20 MG TABLET    Take 20 mg by mouth daily.   MULTIPLE VITAMINS-MINERALS (CENTRUM SILVER PO)    Take by mouth daily.     NEUPRO 4 MG/24HR    PLACE 1 PATCH ONTO THE SKIN DAILY.   OMEGA-3 FATTY ACIDS (FISH OIL) 1000 MG CAPS    Take by mouth.   OMEPRAZOLE (PRILOSEC) 20 MG CAPSULE    Take 20 mg by mouth daily.     ZOLPIDEM (AMBIEN) 5 MG TABLET    Take 1 tablet (5 mg total) by mouth at bedtime as needed for sleep.  Modified Medications   No medications on file  Discontinued Medications   No medications on file    Subjective: Samuel Moran is in for his routine follow-up visit. He completed 4 weeks of IV ertapenem on 11/12/2016 for his right foot osteomyelitis. He had involvement of the right first, third and fourth metatarsal heads. He has done well since stopping antibiotics. He has his usual, chronic swelling of his right foot ever since he had a lawnmower accident at age 6. He has had no unusual new swelling, pain or redness.  Review of Systems: Review of Systems  Constitutional: Negative for chills, diaphoresis and fever.  Gastrointestinal: Negative for abdominal pain, diarrhea, nausea and vomiting.  Musculoskeletal: Negative for joint pain.    Past Medical History:  Diagnosis Date  . ED (erectile dysfunction)   . GERD 07/01/2007  . 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 PROSTATE SPECIFIC  ANTIGEN, ELEVATED 06/29/2007  . Rheumatoid arthritis ([place])   . SEIZURE DISORDER 07/01/2007   one epsiode only  . Spinal stenosis of lumbar region 09/22/2014    Social History  Substance Use Topics  . Smoking status: Light Tobacco Smoker    Types: Cigars  . Smokeless tobacco: Never Used     Comment: 1 cigar a week  . Alcohol use 2.0 oz/week    4 Standard drinks or equivalent per week     Comment: one drink a day (liquor)    Family History  Problem Relation Age of Onset  . Arthritis Mother   . Alcoholism Mother   . Arthritis Father        died after fall/trauma and had TBI    No Known Allergies  Objective: Vitals:   02/03/17 0901  BP: (!) 166/80  Pulse: 79  Temp: 98.2 F (36.8 C)  TempSrc: Oral  Weight: 222 lb 1.9 oz (100.8 kg)   Body mass index is 30.98 kg/m.  Physical Exam  Constitutional:  He is in good spirits.  Musculoskeletal:  His right great toe was traumatically amputated at age 71. He has chronic, unchanged swelling around the right first metatarsal head but no signs of acute inflammation suggesting ongoing infection.    Lab Results    Problem List Items Addressed This Visit      High  Osteomyelitis of right foot ([place])    I believe his infection has been cured. He can follow-up here as need.          Michel Bickers, MD [facility] [phone] pager   [phone] cell 02/03/2017, 9:10 AM

## 2017-02-03 NOTE — Assessment & Plan Note (Signed)
I believe his infection has been cured. He can follow-up here as need.

## 2017-02-05 ENCOUNTER — Telehealth: Payer: Self-pay | Admitting: Neurology

## 2017-02-05 NOTE — Telephone Encounter (Signed)
Patient called regarding a flight that he will be taking tomorrow across country. He has problems with Restless Leg Syndrome. He is wanting to know about a medication? Please Advise. Thanks

## 2017-02-05 NOTE — Telephone Encounter (Signed)
Spoke with patient. He states RLS only acts up when he sits for long amounts of time. He hasn't flown in along time and wanted to make sure Neupro wouldn't make RLS worse on his long flight.  I made him aware Neupro is actually used to treat RLS so it should help.  He will call with any other problems.

## 2017-02-10 ENCOUNTER — Encounter: Payer: Medicare HMO | Admitting: Internal Medicine

## 2017-02-19 DIAGNOSIS — M06341 Rheumatoid nodule, right hand: Secondary | ICD-10-CM | POA: Diagnosis not present

## 2017-02-19 DIAGNOSIS — M06342 Rheumatoid nodule, left hand: Secondary | ICD-10-CM | POA: Diagnosis not present

## 2017-02-20 ENCOUNTER — Telehealth: Payer: Self-pay | Admitting: Internal Medicine

## 2017-02-20 NOTE — Telephone Encounter (Signed)
Pt is calling wanting a referral to a audiologist due to having hearing problems.  Pt is aware that Dr. Raliegh Ip is not in the office and refused to see another provider.

## 2017-02-24 ENCOUNTER — Other Ambulatory Visit: Payer: Self-pay | Admitting: Internal Medicine

## 2017-02-24 DIAGNOSIS — H9190 Unspecified hearing loss, unspecified ear: Secondary | ICD-10-CM

## 2017-02-24 NOTE — Telephone Encounter (Signed)
Please advise 

## 2017-02-25 NOTE — Telephone Encounter (Signed)
Pt was made aware.  

## 2017-02-25 NOTE — Telephone Encounter (Signed)
Notify patient that I put consultation in for audiology

## 2017-02-26 ENCOUNTER — Ambulatory Visit (INDEPENDENT_AMBULATORY_CARE_PROVIDER_SITE_OTHER): Payer: Medicare HMO | Admitting: Internal Medicine

## 2017-02-26 ENCOUNTER — Encounter: Payer: Self-pay | Admitting: Internal Medicine

## 2017-02-26 VITALS — BP 138/72 | HR 76 | Temp 98.1°F | Ht 71.0 in | Wt 216.2 lb

## 2017-02-26 DIAGNOSIS — H6121 Impacted cerumen, right ear: Secondary | ICD-10-CM | POA: Diagnosis not present

## 2017-02-26 DIAGNOSIS — Z0000 Encounter for general adult medical examination without abnormal findings: Secondary | ICD-10-CM

## 2017-02-26 DIAGNOSIS — E785 Hyperlipidemia, unspecified: Secondary | ICD-10-CM

## 2017-02-26 DIAGNOSIS — Z0001 Encounter for general adult medical examination with abnormal findings: Secondary | ICD-10-CM | POA: Diagnosis not present

## 2017-02-26 DIAGNOSIS — M069 Rheumatoid arthritis, unspecified: Secondary | ICD-10-CM

## 2017-02-26 DIAGNOSIS — K219 Gastro-esophageal reflux disease without esophagitis: Secondary | ICD-10-CM | POA: Diagnosis not present

## 2017-02-26 LAB — CBC WITH DIFFERENTIAL/PLATELET
BASOS ABS: 0 10*3/uL (ref 0.0–0.1)
BASOS PCT: 0.3 % (ref 0.0–3.0)
EOS ABS: 0.1 10*3/uL (ref 0.0–0.7)
Eosinophils Relative: 1.1 % (ref 0.0–5.0)
HEMATOCRIT: 38.5 % — AB (ref 39.0–52.0)
HEMOGLOBIN: 12.5 g/dL — AB (ref 13.0–17.0)
LYMPHS PCT: 15.3 % (ref 12.0–46.0)
Lymphs Abs: 1.2 10*3/uL (ref 0.7–4.0)
MCHC: 32.5 g/dL (ref 30.0–36.0)
MCV: 84.9 fl (ref 78.0–100.0)
MONO ABS: 0.5 10*3/uL (ref 0.1–1.0)
Monocytes Relative: 6.2 % (ref 3.0–12.0)
Neutro Abs: 6.1 10*3/uL (ref 1.4–7.7)
Neutrophils Relative %: 77.1 % — ABNORMAL HIGH (ref 43.0–77.0)
PLATELETS: 281 10*3/uL (ref 150.0–400.0)
RBC: 4.53 Mil/uL (ref 4.22–5.81)
RDW: 14.7 % (ref 11.5–15.5)
WBC: 8 10*3/uL (ref 4.0–10.5)

## 2017-02-26 LAB — LIPID PANEL
CHOL/HDL RATIO: 3
Cholesterol: 168 mg/dL (ref 0–200)
HDL: 52.9 mg/dL (ref >39.00)
LDL Cholesterol: 104 mg/dL — ABNORMAL HIGH (ref 0–99)
NonHDL: 114.88
Triglycerides: 55 mg/dL (ref 0.0–149.0)
VLDL: 11 mg/dL (ref 0.0–40.0)

## 2017-02-26 LAB — COMPREHENSIVE METABOLIC PANEL
ALK PHOS: 49 U/L (ref 39–117)
ALT: 14 U/L (ref 0–53)
AST: 22 U/L (ref 0–37)
Albumin: 3.8 g/dL (ref 3.5–5.2)
BILIRUBIN TOTAL: 0.8 mg/dL (ref 0.2–1.2)
BUN: 24 mg/dL — ABNORMAL HIGH (ref 6–23)
CALCIUM: 8.8 mg/dL (ref 8.4–10.5)
CHLORIDE: 101 meq/L (ref 96–112)
CO2: 28 meq/L (ref 19–32)
CREATININE: 0.87 mg/dL (ref 0.40–1.50)
GFR: 92.32 mL/min (ref >60.00)
Glucose, Bld: 106 mg/dL — ABNORMAL HIGH (ref 70–99)
POTASSIUM: 3.8 meq/L (ref 3.5–5.1)
SODIUM: 136 meq/L (ref 135–145)
Total Protein: 6.5 g/dL (ref 6.0–8.3)

## 2017-02-26 LAB — TSH: TSH: 1.59 u[IU]/mL (ref 0.35–4.50)

## 2017-02-26 NOTE — Patient Instructions (Addendum)
Limit your sodium (Salt) intake    It is important that you exercise regularly, at least 20 minutes 3 to 4 times per week.  If you develop chest pain or shortness of breath seek  medical attention.   Earwax Buildup, Adult The ears produce a substance called earwax that helps keep bacteria out of the ear and protects the skin in the ear canal. Occasionally, earwax can build up in the ear and cause discomfort or hearing loss. What increases the risk? This condition is more likely to develop in people who:  Are male.  Are elderly.  Naturally produce more earwax.  Clean their ears often with cotton swabs.  Use earplugs often.  Use in-ear headphones often.  Wear hearing aids.  Have narrow ear canals.  Have earwax that is overly thick or sticky.  Have eczema.  Are dehydrated.  Have excess hair in the ear canal.  What are the signs or symptoms? Symptoms of this condition include:  Reduced or muffled hearing.  A feeling of fullness in the ear or feeling that the ear is plugged.  Fluid coming from the ear.  Ear pain.  Ear itch.  Ringing in the ear.  Coughing.  An obvious piece of earwax that can be seen inside the ear canal.  How is this diagnosed? This condition may be diagnosed based on:  Your symptoms.  Your medical history.  An ear exam. During the exam, your health care provider will look into your ear with an instrument called an otoscope.  You may have tests, including a hearing test. How is this treated? This condition may be treated by:  Using ear drops to soften the earwax.  Having the earwax removed by a health care provider. The health care provider may: ? Flush the ear with water. ? Use an instrument that has a loop on the end (curette). ? Use a suction device.  Surgery to remove the wax buildup. This may be done in severe cases.  Follow these instructions at home:  Take over-the-counter and prescription medicines only as told by your  health care provider.  Do not put any objects, including cotton swabs, into your ear. You can clean the opening of your ear canal with a washcloth or facial tissue.  Follow instructions from your health care provider about cleaning your ears. Do not over-clean your ears.  Drink enough fluid to keep your urine clear or pale yellow. This will help to thin the earwax.  Keep all follow-up visits as told by your health care provider. If earwax builds up in your ears often or if you use hearing aids, consider seeing your health care provider for routine, preventive ear cleanings. Ask your health care provider how often you should schedule your cleanings.  If you have hearing aids, clean them according to instructions from the manufacturer and your health care provider. Contact a health care provider if:  You have ear pain.  You develop a fever.  You have blood, pus, or other fluid coming from your ear.  You have hearing loss.  You have ringing in your ears that does not go away.  Your symptoms do not improve with treatment.  You feel like the room is spinning (vertigo). Summary  Earwax can build up in the ear and cause discomfort or hearing loss.  The most common symptoms of this condition include reduced or muffled hearing and a feeling of fullness in the ear or feeling that the ear is plugged.  This condition  may be diagnosed based on your symptoms, your medical history, and an ear exam.  This condition may be treated by using ear drops to soften the earwax or by having the earwax removed by a health care provider.  Do not put any objects, including cotton swabs, into your ear. You can clean the opening of your ear canal with a washcloth or facial tissue.   Schedule your colonoscopy to help detect colon cancer.

## 2017-02-26 NOTE — Progress Notes (Signed)
Subjective:    Patient ID: Samuel Moran, male    DOB: 05/26/1945, 71 y.o.   MRN: [id number]  HPI 71 year old patient who is seen today for a preventive health examination as well as subsequent Medicare wellness visit He has a history of RA and is followed closely by rheumatology. He has been treated this year for osteomyelitis involving the right foot and recently has been released by infectious disease.  He is followed closely by neurology with a history of PD. He has recently completed prednisone therapy for flare of RA.  He continues to have significant right hip pain  Past Medical History:  Diagnosis Date  . ED (erectile dysfunction)   . GERD 07/01/2007  . 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 PROSTATE SPECIFIC ANTIGEN, ELEVATED 06/29/2007  . Rheumatoid arthritis ([place])   . SEIZURE DISORDER 07/01/2007   one epsiode only  . Spinal stenosis of lumbar region 09/22/2014     Social History   Social History  . Marital status: Single    Spouse name: N/A  . Number of children: N/A  . Years of education: N/A   Occupational History  . Chartered certified accountant    Social History Main Topics  . Smoking status: Light Tobacco Smoker    Types: Cigars  . Smokeless tobacco: Never Used     Comment: 1 cigar a week  . Alcohol use 2.0 oz/week    4 Standard drinks or equivalent per week     Comment: one drink a day (liquor)  . Drug use: No  . Sexual activity: Not on file   Other Topics Concern  . Not on file   Social History Narrative  . No narrative on file    Past Surgical History:  Procedure Laterality Date  . AMPUTATION     right great toe - tramatic age 45  . IR FLUORO GUIDE CV LINE RIGHT  10/16/2016  . IR US GUIDE VASC ACCESS RIGHT  10/16/2016    Family History  Problem Relation Age of Onset  . Arthritis Mother   . Alcoholism Mother   . Arthritis Father        died after fall/trauma and had TBI    No Known Allergies  Current Outpatient Prescriptions on File Prior to Visit   Medication Sig Dispense Refill  . ibuprofen (ADVIL,MOTRIN) 800 MG tablet Take 800 mg by mouth as needed.    . leflunomide (ARAVA) 20 MG tablet Take 20 mg by mouth daily.    . Multiple Vitamins-Minerals (CENTRUM SILVER PO) Take by mouth daily.      Marland Kitchen NEUPRO 4 MG/24HR PLACE 1 PATCH ONTO THE SKIN DAILY. 30 patch 5  . Omega-3 Fatty Acids (FISH OIL) 1000 MG CAPS Take by mouth.    Marland Kitchen omeprazole (PRILOSEC) 20 MG capsule Take 20 mg by mouth daily.      Marland Kitchen zolpidem (AMBIEN) 5 MG tablet Take 1 tablet (5 mg total) by mouth at bedtime as needed for sleep. 15 tablet 1   No current facility-administered medications on file prior to visit.     BP 138/72 (BP Location: Left Arm, Patient Position: Sitting, Cuff Size: Normal)   Pulse 76   Temp 98.1 F (36.7 C) (Oral)   Ht 5\' 11"  (1.803 m)   Wt 216 lb 3.2 oz (98.1 kg)   SpO2 98%   BMI 30.15 kg/m   Subsequent Medicare wellness visit  1. Risk factors, based on past  M,S,F history.  Progress risk factors include a history of  mild dyslipidemia  2.  Physical activities: recently retired.  Does participated his health club 3-4 times a week with weight training and has more recently started cardio  3.  Depression/mood:o history of major depression or mood disorder  4.  Hearing: slight hearing loss, right ear 5.  ADL's:  6.  Fall risk: low  7.  Home safety:no problems identified  8.  Height weight, and visual acuity;height and weight stable no change in visual acuity does have an annual eye examination  9.  Counseling: modest weight loss more rigorousaerobic activities.  Encouraged  10. Lab orders based on risk factors:laboratory update will be reviewed  11. Referral :GI referral for follow-up colonoscopy  12. Care plan:continue efforts at aggressive risk factor modification  13. Cognitive assessment: alert and oriented with normal affect.  No cognitive dysfunction  14. Screening: Patient provided with a written and personalized 5-10 year  screening schedule in the AVS.    15. Provider List Update: primary care neurology, GI and rheumatology as well as ophthalmology.  Patient has been released by infectious disease  16.  Patient does have advanced directives in    Review of Systems  Constitutional: Negative for appetite change, chills, fatigue and fever.  HENT: Negative for congestion, dental problem, ear pain, hearing loss, sore throat, tinnitus, trouble swallowing and voice change.   Eyes: Negative for pain, discharge and visual disturbance.  Respiratory: Negative for cough, chest tightness, wheezing and stridor.   Cardiovascular: Negative for chest pain, palpitations and leg swelling.  Gastrointestinal: Negative for abdominal distention, abdominal pain, blood in stool, constipation, diarrhea, nausea and vomiting.  Genitourinary: Negative for difficulty urinating, discharge, flank pain, genital sores, hematuria and urgency.  Musculoskeletal: Positive for arthralgias. Negative for back pain, gait problem, joint swelling, myalgias and neck stiffness.  Skin: Negative for rash.  Neurological: Negative for dizziness, syncope, speech difficulty, weakness, numbness and headaches.  Hematological: Negative for adenopathy. Does not bruise/bleed easily.  Psychiatric/Behavioral: Negative for behavioral problems and dysphoric mood. The patient is not nervous/anxious.        Objective:   Physical Exam  Constitutional: He appears well-developed and well-nourished.  Weight 216 Blood pressure 124/80  HENT:  Head: Normocephalic and atraumatic.  Right Ear: External ear normal.  Left Ear: External ear normal.  Nose: Nose normal.  Mouth/Throat: Oropharynx is clear and moist.  Cerumen impaction.  Right canal  Eyes: Pupils are equal, round, and reactive to light. Conjunctivae and EOM are normal. No scleral icterus.  Neck: Normal range of motion. Neck supple. No JVD present. No thyromegaly present.  Cardiovascular: Regular rhythm,  normal heart sounds and intact distal pulses.  Exam reveals no gallop and no friction rub.   No murmur heard. Pulmonary/Chest: Effort normal and breath sounds normal. He exhibits no tenderness.  Abdominal: Soft. Bowel sounds are normal. He exhibits no distension and no mass. There is no tenderness.  Genitourinary: Prostate normal and penis normal.  Genitourinary Comments: Mild BPH  Musculoskeletal: Normal range of motion. He exhibits no edema or tenderness.  Status post right great toe amputation and chronic swelling of the distal right foot  Lymphadenopathy:    He has no cervical adenopathy.  Neurological: He is alert. He has normal reflexes. No cranial nerve deficit. Coordination normal.  Resting tremor right hand  Skin: Skin is warm and dry. No rash noted.  Psychiatric: He has a normal mood and affect. His behavior is normal.          Assessment & Plan:  Preventive health examination Medicare wellness visit history dyslipidemia.  Will review a lipid profile PD.  Follow-up neurology RA.  Follow-up rheumatology Status post osteomyelitis, right foot  Clinically resolved Cerumen impaction.  Right canal irrigated until clear  Screening colonoscopy Flu vaccine obtained last month  Follow-up one year or as needed  [employer]

## 2017-02-27 DIAGNOSIS — M0579 Rheumatoid arthritis with rheumatoid factor of multiple sites without organ or systems involvement: Secondary | ICD-10-CM | POA: Diagnosis not present

## 2017-02-27 DIAGNOSIS — M2550 Pain in unspecified joint: Secondary | ICD-10-CM | POA: Diagnosis not present

## 2017-02-27 DIAGNOSIS — Z79899 Other long term (current) drug therapy: Secondary | ICD-10-CM | POA: Diagnosis not present

## 2017-02-27 DIAGNOSIS — Z6830 Body mass index (BMI) 30.0-30.9, adult: Secondary | ICD-10-CM | POA: Diagnosis not present

## 2017-02-27 DIAGNOSIS — E669 Obesity, unspecified: Secondary | ICD-10-CM | POA: Diagnosis not present

## 2017-02-28 DIAGNOSIS — M5416 Radiculopathy, lumbar region: Secondary | ICD-10-CM | POA: Diagnosis not present

## 2017-02-28 DIAGNOSIS — M5441 Lumbago with sciatica, right side: Secondary | ICD-10-CM | POA: Diagnosis not present

## 2017-02-28 DIAGNOSIS — M5136 Other intervertebral disc degeneration, lumbar region: Secondary | ICD-10-CM | POA: Diagnosis not present

## 2017-02-28 DIAGNOSIS — M48061 Spinal stenosis, lumbar region without neurogenic claudication: Secondary | ICD-10-CM | POA: Diagnosis not present

## 2017-03-02 ENCOUNTER — Encounter: Payer: Self-pay | Admitting: Gastroenterology

## 2017-03-05 HISTORY — PX: HAND SURGERY: SHX662

## 2017-03-06 ENCOUNTER — Other Ambulatory Visit: Payer: Self-pay | Admitting: Orthopedic Surgery

## 2017-03-06 DIAGNOSIS — M0579 Rheumatoid arthritis with rheumatoid factor of multiple sites without organ or systems involvement: Secondary | ICD-10-CM | POA: Diagnosis not present

## 2017-03-06 DIAGNOSIS — M5136 Other intervertebral disc degeneration, lumbar region: Secondary | ICD-10-CM

## 2017-03-16 DIAGNOSIS — R69 Illness, unspecified: Secondary | ICD-10-CM | POA: Diagnosis not present

## 2017-03-16 DIAGNOSIS — D481 Neoplasm of uncertain behavior of connective and other soft tissue: Secondary | ICD-10-CM | POA: Diagnosis not present

## 2017-03-16 DIAGNOSIS — M06341 Rheumatoid nodule, right hand: Secondary | ICD-10-CM | POA: Diagnosis not present

## 2017-03-18 ENCOUNTER — Telehealth: Payer: Self-pay | Admitting: Neurology

## 2017-03-18 ENCOUNTER — Ambulatory Visit
Admission: RE | Admit: 2017-03-18 | Discharge: 2017-03-18 | Disposition: A | Discharge status: Home / Self Care | Payer: Medicare HMO | Source: Ambulatory Visit | Attending: Orthopedic Surgery | Admitting: Orthopedic Surgery

## 2017-03-18 DIAGNOSIS — M48061 Spinal stenosis, lumbar region without neurogenic claudication: Secondary | ICD-10-CM | POA: Diagnosis not present

## 2017-03-18 DIAGNOSIS — M5136 Other intervertebral disc degeneration, lumbar region: Secondary | ICD-10-CM

## 2017-03-18 MED ORDER — DIAZEPAM 5 MG PO TABS
5.0000 mg | ORAL_TABLET | Freq: Once | ORAL | Status: AC
  Administered 2017-03-18: 5 mg via ORAL

## 2017-03-18 MED ORDER — ONDANSETRON HCL 4 MG/2ML IJ SOLN: 4.0000 mg | Freq: Four times a day (QID) | INTRAMUSCULAR | Status: DC | PRN

## 2017-03-18 MED ORDER — IOPAMIDOL (ISOVUE-M 200) INJECTION 41%
18.0000 mL | Freq: Once | INTRAMUSCULAR | Status: AC
  Administered 2017-03-18: 18 mL via INTRATHECAL

## 2017-03-18 NOTE — Telephone Encounter (Signed)
Will discuss at his appt in 3 weeks (12/7)

## 2017-03-18 NOTE — Telephone Encounter (Signed)
Left message on machine for patient to call back.

## 2017-03-18 NOTE — Telephone Encounter (Signed)
Pt called and said the shaking is getting worse and wants to know if his Nupro patch should be adjusted

## 2017-03-18 NOTE — Telephone Encounter (Signed)
Patient on Neupro 4 mg daily. Please advise.

## 2017-03-18 NOTE — Discharge Instructions (Signed)

## 2017-03-20 NOTE — Telephone Encounter (Signed)
Patient made aware.

## 2017-03-24 DIAGNOSIS — M5136 Other intervertebral disc degeneration, lumbar region: Secondary | ICD-10-CM | POA: Diagnosis not present

## 2017-03-24 DIAGNOSIS — M5441 Lumbago with sciatica, right side: Secondary | ICD-10-CM | POA: Diagnosis not present

## 2017-03-24 DIAGNOSIS — M48061 Spinal stenosis, lumbar region without neurogenic claudication: Secondary | ICD-10-CM | POA: Diagnosis not present

## 2017-03-24 DIAGNOSIS — M5416 Radiculopathy, lumbar region: Secondary | ICD-10-CM | POA: Diagnosis not present

## 2017-03-30 ENCOUNTER — Telehealth: Payer: Self-pay | Admitting: *Deleted

## 2017-03-30 NOTE — Telephone Encounter (Signed)
Please refer to Neurosurgery

## 2017-03-30 NOTE — Telephone Encounter (Signed)
Patient requesting a referral to a different orthopedic or surgeon, please advise.

## 2017-03-30 NOTE — Telephone Encounter (Signed)
Copied from [place] [id number]. Topic: Referral - Request >> Mar 30, 2017  8:38 AM Samuel Moran, NT wrote: Reason for CRM: Patient requesting a referral to a different orthopedic or surgeon for his back. He currently goes to [facility] and would like to see someone else for a second opinion. If before 10am mobile number if not call home number.

## 2017-03-31 NOTE — Telephone Encounter (Signed)
Pt  Had  questions   About  Another  referral  Pt  Advised  That  A  Neurosurgical  Consult  Being  Done

## 2017-04-01 ENCOUNTER — Other Ambulatory Visit: Payer: Self-pay | Admitting: Internal Medicine

## 2017-04-01 DIAGNOSIS — M549 Dorsalgia, unspecified: Secondary | ICD-10-CM

## 2017-04-01 NOTE — Telephone Encounter (Signed)
Pt refers to Neurosurgery, pt was made aware.

## 2017-04-08 DIAGNOSIS — H2513 Age-related nuclear cataract, bilateral: Secondary | ICD-10-CM | POA: Diagnosis not present

## 2017-04-08 DIAGNOSIS — H52223 Regular astigmatism, bilateral: Secondary | ICD-10-CM | POA: Diagnosis not present

## 2017-04-08 DIAGNOSIS — H532 Diplopia: Secondary | ICD-10-CM | POA: Diagnosis not present

## 2017-04-08 DIAGNOSIS — H16223 Keratoconjunctivitis sicca, not specified as Sjogren's, bilateral: Secondary | ICD-10-CM | POA: Diagnosis not present

## 2017-04-08 DIAGNOSIS — M0570 Rheumatoid arthritis with rheumatoid factor of unspecified site without organ or systems involvement: Secondary | ICD-10-CM | POA: Diagnosis not present

## 2017-04-08 DIAGNOSIS — H524 Presbyopia: Secondary | ICD-10-CM | POA: Diagnosis not present

## 2017-04-09 NOTE — Progress Notes (Signed)
Samuel Moran was seen today in the movement disorders clinic for neurologic consultation at the request of [name], MD.  The consultation is for the evaluation of R hand tremor and gait change x 6 months.  Tremor is noted at rest.  He is R hand dominant.  Tremor is gone when he picks up arm or makes a fist.  Pt states that he takes methotrexate and he noted that after he takes it he will shuffle for a few days and not swing the arms.  Dr. [name], therefore, took him off of the medication 3 weeks ago and he feels that walking is a bit better.  The records that were made available to me were reviewed.  Does report hx of seizure in 2008 and was at self checkout and couldn't figure out how to do steps.  Got finished, walked to car and next thing he remembers is being in ambulance.  Apparently, got in car and had death grip on wheel.  EMS got there and he was confused and wouldn't unlock door.  They broke window and took him to hospital.  W/U negative except "white spot" on brain.  03/14/16 update:  Pt returns for f/u much earlier than expected.  Has dx of PD, and last visit he didn't want to start medication.  He decided almost immediately after the visit that he wanted to start medication but he wanted to come back and discuss and discuss the medication in more detail.  Has a myriad of questions today.  Insurance company denied authorization for his MRI brain.  07/03/16 update:  Patient follows up today.  I started him on pramipexole last visit.  He called me in February to state that he had no side effects with medication, but his friends brother-in-law had a sleep attack on the medication and he was concerned about the potential side effect.  We had discussed this potential side effect previously.  Since he was side effect free, I told him that I would recommend that he continue on the medication.  2 weeks later he called to tell me that he was sleeping all of the time on the medication.    Today he states that it was really sleepiness but "spaciness" and "I wasn't all there."   I told him just to discontinue the medication and we could talk about it at follow-up.  I did get a note from his rheumatologist, Dr. [name], dated 06/30/2016 that stated that the patient had an acute rheumatoid arthritis flareup.  She thought that it was because he discontinued his methotrexate.  The patient did that because he thought methotrexate was causing dizziness, but she did not think that was the case and thought it was the fact that he was taking a different version of folic acid.  She restarted methotrexate and folic acid.  Thinks that he is moving around okay short of the RA flare.  He is riding the bike for 10 min 3 times a week.  No falls.  No lightheadedness or near syncope.  Mood has been okay.    09/03/16 update:  Patient seen today in follow-up.  He is not on any Parkinson's medication, which was his desire.  He got off of methotrexate, which he felt caused side effects.  He is now on leflunomide and would like to get back on medications for Parkinson's disease.  I just got a note from his rheumatologist that the patient wanted to wait on restarting methotrexate until  he started his Parkinson's medicine.  The reason we waited last visit to restart his Parkinson's medicine was because he was getting started back on rheumatoid medicine.  He brings medication pamphlet on rheumatoid meds he is considering so that meds we choose don't interfere.   The patient is having more trouble with tremor and arm swing.  He has not had any falls.  He has not had any lightheadedness or near syncope.  He denies significant depression.  12/09/16 update:  Patient seen in follow-up.  He was started on Neupro last visit.  He is now on 4 mg daily.  He has had no sleep attacks.  No compulsive behaviors.  No site rxn's. He thinks that it controls tremor well.  He did miss the medication on Sunday and then noted more tremor on Monday.   He had one fall in [place] but he caught his foot on a cushion; he didn't get hurt.  Pt denies lightheadedness, near syncope.  No hallucinations.  Mood has been good.  He is exercising on the stationary bike in the gym 4 days a week (11 min per time).    04/10/17 update: Patient seen in follow-up for Parkinson's disease.  He is on the Neupro patch, 4 mg daily. No site reactions. He called me last month to state that he felt his tremors worse.  He wanted me to adjust his Neupro.  I told him I wanted to see him first, as I already knew he had an pending appointment.  He has had no falls.   He had a single instance since last visit of lightheadedness/dizziness but he accidentally was wearing 2 neupro patches. I have reviewed records available to me since last visit.  He was seen in orthopedic surgeon who does spine for low back pain.  He has requested a referral to a neurosurgeon, which has been made by his primary care physician.  PREVIOUS MEDICATIONS: Pramipexole (patient had a friend who had a sleep attack on the medication and subsequently the patient developed sleepiness on the medication and we decided to discontinue)  ALLERGIES:  No Known Allergies  CURRENT MEDICATIONS:  Outpatient Encounter Medications as of 04/10/2017  Medication Sig  . ibuprofen (ADVIL,MOTRIN) 800 MG tablet Take 800 mg by mouth as needed.  . leflunomide (ARAVA) 20 MG tablet Take 20 mg by mouth daily.  . Multiple Vitamins-Minerals (CENTRUM SILVER PO) Take by mouth daily.    Marland Kitchen NEUPRO 4 MG/24HR PLACE 1 PATCH ONTO THE SKIN DAILY.  Marland Kitchen Omega-3 Fatty Acids (FISH OIL) 1000 MG CAPS Take by mouth.  . [DISCONTINUED] omeprazole (PRILOSEC) 20 MG capsule Take 20 mg by mouth daily.    . [DISCONTINUED] zolpidem (AMBIEN) 5 MG tablet Take 1 tablet (5 mg total) by mouth at bedtime as needed for sleep. (Patient not taking: Reported on 03/06/2017)   No facility-administered encounter medications on file as of 04/10/2017.     PAST MEDICAL  HISTORY:   Past Medical History:  Diagnosis Date  . ED (erectile dysfunction)   . GERD 07/01/2007  . 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 PROSTATE SPECIFIC ANTIGEN, ELEVATED 06/29/2007  . Rheumatoid arthritis ([place])   . SEIZURE DISORDER 07/01/2007   one epsiode only  . Spinal stenosis of lumbar region 09/22/2014    PAST SURGICAL HISTORY:   Past Surgical History:  Procedure Laterality Date  . AMPUTATION     right great toe - tramatic age 9  . IR FLUORO GUIDE CV LINE RIGHT  10/16/2016  . IR  US GUIDE VASC ACCESS RIGHT  10/16/2016    SOCIAL HISTORY:   Social History   Socioeconomic History  . Marital status: Single    Spouse name: Not on file  . Number of children: Not on file  . Years of education: Not on file  . Highest education level: Not on file  Social Needs  . Financial resource strain: Not on file  . Food insecurity - worry: Not on file  . Food insecurity - inability: Not on file  . Transportation needs - medical: Not on file  . Transportation needs - non-medical: Not on file  Occupational History  . Occupation: Chartered certified accountant  Tobacco Use  . Smoking status: Light Tobacco Smoker    Types: Cigars  . Smokeless tobacco: Never Used  . Tobacco comment: 1 cigar a week  Substance and Sexual Activity  . Alcohol use: Yes    Alcohol/week: 2.0 oz    Types: 4 Standard drinks or equivalent per week    Comment: one drink a day (liquor)  . Drug use: No  . Sexual activity: Not on file  Other Topics Concern  . Not on file  Social History Narrative  . Not on file    FAMILY HISTORY:   Family Status  Relation Name Status  . Mother  Deceased  . Father  Deceased  . Child adopted Alive    ROS:  LBP on the right (same as last visit).  A complete 10 system review of systems was obtained and was unremarkable apart from what is mentioned above.  PHYSICAL EXAMINATION:    VITALS:   Vitals:   04/10/17 0822  BP: (!) 160/86  Pulse: 82  SpO2: 96%  Weight: 220 lb (99.8 kg)    Height: 5' 11"  (1.803 m)    GEN:  The patient appears stated age and is in NAD. HEENT:  Normocephalic, atraumatic.  The mucous membranes are moist. The superficial temporal arteries are without ropiness or tenderness. CV:  RRR Lungs:  CTAB Neck/HEME:  There are no carotid bruits bilaterally. Derm:  There are nodules over the PIP joints.  No skin site reactions where patches placed.  He has some wounds on R hand where rheumatoid nodules were taken off.    Neurological examination:  Orientation: The patient is alert and oriented x3.  Cranial nerves: There is good facial symmetry. There is facial hypomimia. The visual fields are full to confrontational testing. The speech is fluent and clear.  He is hypophonic.  Soft palate rises symmetrically and there is no tongue deviation. Hearing is intact to conversational tone. Sensation: Sensation is intact to light touch throughout. Motor: Strength is 5/5 in the bilateral upper and lower extremities.   Shoulder shrug is equal and symmetric.  There is no pronator drift.  Movement examination: Tone: There is minimal rigidity in the UE bilaterally Abnormal movements: Rare tremor in the RUE and increased tremor with ambulation in the RUE (same as prior) Coordination:  There is mild decremation with all forms in the UEs, but good in the lower extremities.   Gait and Station: The patient has minimal difficulty arising out of a deep-seated chair without the use of the hands. The patient's stride length is normal with decreased arm swing bilaterally.  The patient has a negative pull test.      LABS    Chemistry      Component Value Date/Time   NA 136 02/26/2017 0922   NA 139 07/08/2016 1158   K 3.8  02/26/2017 0922   CL 101 02/26/2017 0922   CO2 28 02/26/2017 0922   BUN 24 (H) 02/26/2017 0922   BUN 22 07/08/2016 1158   CREATININE 0.87 02/26/2017 0922   CREATININE 0.98 10/14/2016 1048      Component Value Date/Time   CALCIUM 8.8 02/26/2017 0922    ALKPHOS 49 02/26/2017 0922   AST 22 02/26/2017 0922   ALT 14 02/26/2017 0922   BILITOT 0.8 02/26/2017 0922     Lab Results  Component Value Date   TSH 1.59 02/26/2017   No results found for: VITAMINB12   ASSESSMENT/PLAN:  1.  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  We discussed treatment options as well as prognostic indicators.  Patient education was provided.  -continue neupro, 4 mg daily.  Discussed r/b/se of the medication extensively  -we talked about adding carbidopa/levodopa tid for its synergistic effect.  Risks, benefits, side effects and alternative therapies were discussed.  The opportunity to ask questions was given and they were answered to the best of my ability.  The patient expressed understanding and willingness to follow the outlined treatment protocols.  -I think that anxiety plays a role into worsening of symptoms.  Pt doesn't think that this is an issue  2.  Rheumatoid arthritis  -seeing Dr. [name]  -asked me about DIP nodules and told him to ask Dr. [name]  3.  Follow up is anticipated in the next few months, sooner should new neurologic issues arise.  Much greater than 50% of this visit was spent in counseling and coordinating care.  Total face to face time:  25 min     Cc:  [name], MD

## 2017-04-10 ENCOUNTER — Ambulatory Visit: Payer: Medicare HMO | Admitting: Neurology

## 2017-04-10 ENCOUNTER — Encounter: Payer: Self-pay | Admitting: Neurology

## 2017-04-10 VITALS — BP 160/86 | HR 82 | Ht 71.0 in | Wt 220.0 lb

## 2017-04-10 DIAGNOSIS — G20 Parkinson's disease: Secondary | ICD-10-CM | POA: Diagnosis not present

## 2017-04-10 MED ORDER — CARBIDOPA-LEVODOPA 25-100 MG PO TABS: 1.0000 | ORAL_TABLET | Freq: Three times a day (TID) | ORAL | 1 refills | Status: DC

## 2017-04-10 NOTE — Patient Instructions (Signed)
Start carbidopa/levodopa 25/100, 1/2 tab three times a day before meals x 1 wk, then 1/2 in am & noon & 1 in evening for a week, then 1/2 in am &1 at noon &one in evening for a week, then 1 tablet three times a day 30 min before meals

## 2017-05-07 ENCOUNTER — Ambulatory Visit (AMBULATORY_SURGERY_CENTER): Payer: Medicare HMO | Admitting: *Deleted

## 2017-05-07 ENCOUNTER — Other Ambulatory Visit: Payer: Self-pay

## 2017-05-07 VITALS — Ht 71.0 in | Wt 221.0 lb

## 2017-05-07 DIAGNOSIS — Z1211 Encounter for screening for malignant neoplasm of colon: Secondary | ICD-10-CM

## 2017-05-07 MED ORDER — PEG-KCL-NACL-NASULF-NA ASC-C 140 G PO SOLR: 1.0000 | Freq: Once | ORAL | 0 refills | Status: AC

## 2017-05-07 NOTE — Progress Notes (Signed)
Denies allergies to eggs or soy products. Denies complications with sedation or anesthesia. Denies O2 use. Denies use of diet or weight loss medications.  Emmi instructions given for colonoscopy.  

## 2017-05-13 ENCOUNTER — Encounter: Payer: Self-pay | Admitting: Gastroenterology

## 2017-05-19 ENCOUNTER — Telehealth: Payer: Self-pay | Admitting: Neurology

## 2017-05-19 NOTE — Telephone Encounter (Signed)
Left message on machine for patient to call back.

## 2017-05-19 NOTE — Telephone Encounter (Signed)
Pt called and said he took al;l three of the carbadopa at one time instead of 1 tablet 3 times a day and wants to know if he should start back today on the 1 tablet 3 times a day, he has felt dizzy

## 2017-05-19 NOTE — Telephone Encounter (Signed)
Spoke with patient. He states he pulled meds out of pill box yesterday morning and accidentally took all three instead of dividing them. He didn't take any more Levodopa yesterday or today yet. He is feeling better now. Not dizzy.   He will continue on his regular Levodopa schedule and call with any questions.

## 2017-05-19 NOTE — Telephone Encounter (Signed)
Left message on voicemail that he was returning your call

## 2017-05-21 ENCOUNTER — Encounter: Payer: Self-pay | Admitting: Gastroenterology

## 2017-05-21 ENCOUNTER — Other Ambulatory Visit: Payer: Self-pay

## 2017-05-21 ENCOUNTER — Ambulatory Visit (AMBULATORY_SURGERY_CENTER): Payer: Medicare HMO | Admitting: Gastroenterology

## 2017-05-21 VITALS — BP 127/83 | HR 71 | Temp 96.0°F | Resp 10 | Ht 71.0 in | Wt 221.0 lb

## 2017-05-21 DIAGNOSIS — Z1211 Encounter for screening for malignant neoplasm of colon: Secondary | ICD-10-CM

## 2017-05-21 DIAGNOSIS — Z1212 Encounter for screening for malignant neoplasm of rectum: Secondary | ICD-10-CM | POA: Diagnosis not present

## 2017-05-21 DIAGNOSIS — D123 Benign neoplasm of transverse colon: Secondary | ICD-10-CM

## 2017-05-21 DIAGNOSIS — K635 Polyp of colon: Secondary | ICD-10-CM | POA: Diagnosis not present

## 2017-05-21 DIAGNOSIS — R569 Unspecified convulsions: Secondary | ICD-10-CM | POA: Diagnosis not present

## 2017-05-21 DIAGNOSIS — G20 Parkinson's disease: Secondary | ICD-10-CM | POA: Diagnosis not present

## 2017-05-21 MED ORDER — SODIUM CHLORIDE 0.9 % IV SOLN: 500.0000 mL | Freq: Once | INTRAVENOUS | Status: DC

## 2017-05-21 NOTE — Progress Notes (Signed)
Pt's states no medical or surgical changes since previsit or office visit. 

## 2017-05-21 NOTE — Progress Notes (Signed)
Called to room to assist during endoscopic procedure.  Patient ID and intended procedure confirmed with present staff. Received instructions for my participation in the procedure from the performing physician.  

## 2017-05-21 NOTE — Patient Instructions (Signed)
YOU HAD AN ENDOSCOPIC PROCEDURE TODAY AT [facility]:   Refer to the procedure report that was given to you for any specific questions about what was found during the examination.  If the procedure report does not answer your questions, please call your gastroenterologist to clarify.  If you requested that your care partner not be given the details of your procedure findings, then the procedure report has been included in a sealed envelope for you to review at your convenience later.  YOU SHOULD EXPECT: Some feelings of bloating in the abdomen. Passage of more gas than usual.  Walking can help get rid of the air that was put into your GI tract during the procedure and reduce the bloating. If you had a lower endoscopy (such as a colonoscopy or flexible sigmoidoscopy) you may notice spotting of blood in your stool or on the toilet paper. If you underwent a bowel prep for your procedure, you may not have a normal bowel movement for a few days.  Please Note:  You might notice some irritation and congestion in your nose or some drainage.  This is from the oxygen used during your procedure.  There is no need for concern and it should clear up in a day or so.  SYMPTOMS TO REPORT IMMEDIATELY:   Following lower endoscopy (colonoscopy or flexible sigmoidoscopy):  Excessive amounts of blood in the stool  Significant tenderness or worsening of abdominal pains  Swelling of the abdomen that is new, acute  Fever of 100F or higher  For urgent or emergent issues, a gastroenterologist can be reached at any hour by calling [phone].   DIET:  We do recommend a small meal at first, but then you may proceed to your regular diet.  Drink plenty of fluids but you should avoid alcoholic beverages for 24 hours. Try to increase the fiber in your diet, and drink plenty of water.  ACTIVITY:  You should plan to take it easy for the rest of today and you should NOT DRIVE or use heavy machinery until  tomorrow (because of the sedation medicines used during the test).    FOLLOW UP: Our staff will call the number listed on your records the next business day following your procedure to check on you and address any questions or concerns that you may have regarding the information given to you following your procedure. If we do not reach you, we will leave a message.  However, if you are feeling well and you are not experiencing any problems, there is no need to return our call.  We will assume that you have returned to your regular daily activities without incident.  If any biopsies were taken you will be contacted by phone or by letter within the next 1-3 weeks.  Please call us at [phone] if you have not heard about the biopsies in 3 weeks.    SIGNATURES/CONFIDENTIALITY: You and/or your care partner have signed paperwork which will be entered into your electronic medical record.  These signatures attest to the fact that that the information above on your After Visit Summary has been reviewed and is understood.  Full responsibility of the confidentiality of this discharge information lies with you and/or your care-partner.  No NSAIDS: Aleve, Ibuprofen, aspirin for two weeks to prevent bleeding.

## 2017-05-21 NOTE — Progress Notes (Signed)
Report given to PACU, vss 

## 2017-05-21 NOTE — Op Note (Signed)
[facility] Patient Name: Samuel Moran Procedure Date: 05/21/2017 9:25 AM MRN: [id number] Endoscopist: Remo Lipps P.  MD, MD Age: 72 Referring MD:  Date of Birth: 02-Nov-1945 Gender: Male Account #: [id number] Procedure:                Colonoscopy Indications:              Screening for colorectal malignant neoplasm Medicines:                Monitored Anesthesia Care Procedure:                Pre-Anesthesia Assessment:                           - Prior to the procedure, a History and Physical                            was performed, and patient medications and                            allergies were reviewed. The patient's tolerance of                            previous anesthesia was also reviewed. The risks                            and benefits of the procedure and the sedation                            options and risks were discussed with the patient.                            All questions were answered, and informed consent                            was obtained. Prior Anticoagulants: The patient has                            taken no previous anticoagulant or antiplatelet                            agents. ASA Grade Assessment: III - A patient with                            severe systemic disease. After reviewing the risks                            and benefits, the patient was deemed in                            satisfactory condition to undergo the procedure.                           After obtaining informed consent, the colonoscope  was passed under direct vision. Throughout the                            procedure, the patient's blood pressure, pulse, and                            oxygen saturations were monitored continuously. The                            Colonoscope was introduced through the anus and                            advanced to the the cecum, identified by                            appendiceal orifice  and ileocecal valve. The                            colonoscopy was performed without difficulty. The                            patient tolerated the procedure well. The quality                            of the bowel preparation was good. The ileocecal                            valve, appendiceal orifice, and rectum were                            photographed. Scope In: 9:30:25 AM Scope Out: 9:47:12 AM Scope Withdrawal Time: 0 hours 15 minutes 42 seconds  Total Procedure Duration: 0 hours 16 minutes 47 seconds  Findings:                 The perianal and digital rectal examinations were                            normal.                           Two sessile polyps were found in the transverse                            colon. The polyps were 4 to 5 mm in size. These                            polyps were removed with a cold snare. Resection                            and retrieval were complete.                           A 7 mm polyp was found in the splenic flexure. The  polyp was flat. The polyp was removed with a cold                            snare. Resection and retrieval were complete.                           Multiple medium-mouthed diverticula were found in                            the sigmoid colon, a few small diverticuli noted in                            the ascending and transverse colon.                           Internal hemorrhoids were found.                           The exam was otherwise without abnormality. Complications:            No immediate complications. Estimated blood loss:                            Minimal. Estimated Blood Loss:     Estimated blood loss was minimal. Impression:               - Two 4 to 5 mm polyps in the transverse colon,                            removed with a cold snare. Resected and retrieved.                           - One 7 mm polyp at the splenic flexure, removed                            with a  cold snare. Resected and retrieved.                           - Moderate diverticulosis in the sigmoid colon,                            mild in the right and transverse.                           - Internal hemorrhoids.                           - The examination was otherwise normal. Recommendation:           - Patient has a contact number available for                            emergencies. The signs and symptoms of potential  delayed complications were discussed with the                            patient. Return to normal activities tomorrow.                            Written discharge instructions were provided to the                            patient.                           - Resume previous diet.                           - Continue present medications.                           - Await pathology results.                           - Repeat colonoscopy is recommended for                            surveillance. The colonoscopy date will be                            determined after pathology results from today's                            exam become available for review.                           - No ibuprofen, naproxen, or other non-steroidal                            anti-inflammatory drugs for 2 weeks after polyp                            removal. Remo Lipps P.  MD, MD 05/21/2017 9:51:44 AM This report has been signed electronically.

## 2017-05-22 ENCOUNTER — Telehealth: Payer: Self-pay | Admitting: *Deleted

## 2017-05-22 DIAGNOSIS — H16223 Keratoconjunctivitis sicca, not specified as Sjogren's, bilateral: Secondary | ICD-10-CM | POA: Diagnosis not present

## 2017-05-22 DIAGNOSIS — H16042 Marginal corneal ulcer, left eye: Secondary | ICD-10-CM | POA: Diagnosis not present

## 2017-05-22 DIAGNOSIS — M0570 Rheumatoid arthritis with rheumatoid factor of unspecified site without organ or systems involvement: Secondary | ICD-10-CM | POA: Diagnosis not present

## 2017-05-22 DIAGNOSIS — H532 Diplopia: Secondary | ICD-10-CM | POA: Diagnosis not present

## 2017-05-22 NOTE — Telephone Encounter (Signed)
  Follow up Call-  Call back number 05/21/2017 03/18/2017  Post procedure Call Back phone  # [phone]  Permission to leave phone message Yes -  Some recent data might be hidden     Patient questions:  Do you have a fever, pain , or abdominal swelling? No. Pain Score  0 *  Have you tolerated food without any problems? Yes.    Have you been able to return to your normal activities? Yes.    Do you have any questions about your discharge instructions: Diet   No. Medications  No. Follow up visit  No.  Do you have questions or concerns about your Care? No.  Actions: * If pain score is 4 or above: No action needed, pain <4.

## 2017-05-26 ENCOUNTER — Encounter: Payer: Self-pay | Admitting: Podiatry

## 2017-05-26 ENCOUNTER — Ambulatory Visit (INDEPENDENT_AMBULATORY_CARE_PROVIDER_SITE_OTHER): Payer: Medicare HMO

## 2017-05-26 ENCOUNTER — Ambulatory Visit: Payer: Medicare HMO | Admitting: Podiatry

## 2017-05-26 VITALS — BP 145/74 | HR 82 | Temp 97.8°F | Resp 18

## 2017-05-26 DIAGNOSIS — L97501 Non-pressure chronic ulcer of other part of unspecified foot limited to breakdown of skin: Secondary | ICD-10-CM

## 2017-05-26 DIAGNOSIS — L02619 Cutaneous abscess of unspecified foot: Secondary | ICD-10-CM

## 2017-05-26 DIAGNOSIS — M71379 Other bursal cyst, unspecified ankle and foot: Secondary | ICD-10-CM

## 2017-05-26 DIAGNOSIS — L03119 Cellulitis of unspecified part of limb: Secondary | ICD-10-CM

## 2017-05-26 MED ORDER — AMOXICILLIN-POT CLAVULANATE 875-125 MG PO TABS: 1.0000 | ORAL_TABLET | Freq: Two times a day (BID) | ORAL | 0 refills | Status: DC

## 2017-05-26 MED ORDER — MUPIROCIN 2 % EX OINT: 1.0000 "application " | TOPICAL_OINTMENT | Freq: Two times a day (BID) | CUTANEOUS | 2 refills | Status: DC

## 2017-05-27 ENCOUNTER — Encounter: Payer: Self-pay | Admitting: Gastroenterology

## 2017-05-27 DIAGNOSIS — H16042 Marginal corneal ulcer, left eye: Secondary | ICD-10-CM | POA: Diagnosis not present

## 2017-05-27 DIAGNOSIS — M0570 Rheumatoid arthritis with rheumatoid factor of unspecified site without organ or systems involvement: Secondary | ICD-10-CM | POA: Diagnosis not present

## 2017-05-27 DIAGNOSIS — H16223 Keratoconjunctivitis sicca, not specified as Sjogren's, bilateral: Secondary | ICD-10-CM | POA: Diagnosis not present

## 2017-05-27 NOTE — Progress Notes (Signed)
Subjective: 72 year old male presents the office today for concerns of pain to the top of the foot and concern for arthritis progression.  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  Has had chronic swelling to the inpatient stump for quite some time.  The other surgical sites appear to be doing well.  He denies any recent injury or trauma other than on the amputation site of the hallux he has no other areas of swelling that he has noticed. Denies any systemic complaints such as fevers, chills, nausea, vomiting. No acute changes since last appointment, and no other complaints at this time.   Objective: AAO x3, NAD DP/PT pulses palpable bilaterally, CRT less than 3 seconds Amputation of the hallux is present and there is quite a bit of swelling along this area although not new for him.  There is no overlying erythema or increase in warmth.  There is fluid present within the swelling that I am able to palpate but there is no crepitation.  Ulceration present on the plantar aspect measuring approximately 1.5 x 1.5 cm and is superficial with a granular wound base but there is no probing, undermining or tunneling.  No surrounding erythema, ascending cellulitis.  No fluctuance or crepitus.  No malodor. Mild discomfort in the midfoot there is no area pinpoint bony tenderness. No open lesions or pre-ulcerative lesions.  No pain with calf compression, swelling, warmth, erythema  Assessment: Inflammation, bursitis submetatarsal 1, amputation site with ulceration; foot pain right side  Plan: -All treatment options discussed with the patient including all alternatives, risks, complications.  -X-rays were obtained and reviewed.  No definitive evidence of acute osteomyelitis identified today.  No soft tissue emphysema is present.- In regards to the swelling submetatarsal 1 I recommended aspiration of this area.   After verbal consent was obtained the skin was prepped with Betadine and an 18-gauge needle was utilized to aspirate fluid coming from the area.  It was clear yellow fluid which was aspirated appear to be the same consistency when I aspirated it previously.  There is no pus.  I did send this for culture.  He tolerated this well without any complications.  In regard to the wound only is a small amount of antibiotic ointment and a dressing daily.  Offloading at all times.  He is wearing a regular shoe but will go back into the surgical shoe for some time.  Prescribed Augmentin and mupirocin ointment to apply topically. -Discussed the foot pain but I want to get the wound to heal first as well as the swelling to improve.  He agrees with this. -Patient encouraged to call the office with any questions, concerns, change in symptoms.  -Follow-up as scheduled or sooner if needed.  Call any questions or concerns.  Trula Slade DPM

## 2017-06-01 DIAGNOSIS — Z6830 Body mass index (BMI) 30.0-30.9, adult: Secondary | ICD-10-CM | POA: Diagnosis not present

## 2017-06-01 DIAGNOSIS — M0579 Rheumatoid arthritis with rheumatoid factor of multiple sites without organ or systems involvement: Secondary | ICD-10-CM | POA: Diagnosis not present

## 2017-06-01 DIAGNOSIS — R5383 Other fatigue: Secondary | ICD-10-CM | POA: Diagnosis not present

## 2017-06-01 DIAGNOSIS — M2550 Pain in unspecified joint: Secondary | ICD-10-CM | POA: Diagnosis not present

## 2017-06-01 DIAGNOSIS — G20 Parkinson's disease: Secondary | ICD-10-CM | POA: Diagnosis not present

## 2017-06-01 DIAGNOSIS — Z79899 Other long term (current) drug therapy: Secondary | ICD-10-CM | POA: Diagnosis not present

## 2017-06-01 DIAGNOSIS — E669 Obesity, unspecified: Secondary | ICD-10-CM | POA: Diagnosis not present

## 2017-06-02 DIAGNOSIS — M0570 Rheumatoid arthritis with rheumatoid factor of unspecified site without organ or systems involvement: Secondary | ICD-10-CM | POA: Diagnosis not present

## 2017-06-02 DIAGNOSIS — H16042 Marginal corneal ulcer, left eye: Secondary | ICD-10-CM | POA: Diagnosis not present

## 2017-06-02 DIAGNOSIS — H16223 Keratoconjunctivitis sicca, not specified as Sjogren's, bilateral: Secondary | ICD-10-CM | POA: Diagnosis not present

## 2017-06-10 DIAGNOSIS — M0570 Rheumatoid arthritis with rheumatoid factor of unspecified site without organ or systems involvement: Secondary | ICD-10-CM | POA: Diagnosis not present

## 2017-06-10 DIAGNOSIS — H16003 Unspecified corneal ulcer, bilateral: Secondary | ICD-10-CM | POA: Diagnosis not present

## 2017-06-10 DIAGNOSIS — H16223 Keratoconjunctivitis sicca, not specified as Sjogren's, bilateral: Secondary | ICD-10-CM | POA: Diagnosis not present

## 2017-06-10 DIAGNOSIS — B0052 Herpesviral keratitis: Secondary | ICD-10-CM | POA: Diagnosis not present

## 2017-06-12 ENCOUNTER — Encounter: Payer: Self-pay | Admitting: Podiatry

## 2017-06-12 ENCOUNTER — Ambulatory Visit: Payer: Medicare HMO | Admitting: Podiatry

## 2017-06-12 ENCOUNTER — Other Ambulatory Visit: Payer: Self-pay | Admitting: Podiatry

## 2017-06-12 DIAGNOSIS — L03119 Cellulitis of unspecified part of limb: Secondary | ICD-10-CM | POA: Diagnosis not present

## 2017-06-12 DIAGNOSIS — B0052 Herpesviral keratitis: Secondary | ICD-10-CM | POA: Diagnosis not present

## 2017-06-12 DIAGNOSIS — L02619 Cutaneous abscess of unspecified foot: Secondary | ICD-10-CM

## 2017-06-12 DIAGNOSIS — L0390 Cellulitis, unspecified: Secondary | ICD-10-CM | POA: Diagnosis not present

## 2017-06-12 DIAGNOSIS — H16002 Unspecified corneal ulcer, left eye: Secondary | ICD-10-CM | POA: Diagnosis not present

## 2017-06-12 DIAGNOSIS — L0291 Cutaneous abscess, unspecified: Secondary | ICD-10-CM | POA: Diagnosis not present

## 2017-06-12 DIAGNOSIS — M0570 Rheumatoid arthritis with rheumatoid factor of unspecified site without organ or systems involvement: Secondary | ICD-10-CM | POA: Diagnosis not present

## 2017-06-12 DIAGNOSIS — M71071 Abscess of bursa, right ankle and foot: Secondary | ICD-10-CM

## 2017-06-13 DIAGNOSIS — M069 Rheumatoid arthritis, unspecified: Secondary | ICD-10-CM | POA: Diagnosis not present

## 2017-06-13 DIAGNOSIS — E669 Obesity, unspecified: Secondary | ICD-10-CM | POA: Diagnosis not present

## 2017-06-13 DIAGNOSIS — L089 Local infection of the skin and subcutaneous tissue, unspecified: Secondary | ICD-10-CM | POA: Diagnosis not present

## 2017-06-13 DIAGNOSIS — Z6833 Body mass index (BMI) 33.0-33.9, adult: Secondary | ICD-10-CM | POA: Diagnosis not present

## 2017-06-13 DIAGNOSIS — H44009 Unspecified purulent endophthalmitis, unspecified eye: Secondary | ICD-10-CM | POA: Diagnosis not present

## 2017-06-13 DIAGNOSIS — G20 Parkinson's disease: Secondary | ICD-10-CM | POA: Diagnosis not present

## 2017-06-13 DIAGNOSIS — Z89411 Acquired absence of right great toe: Secondary | ICD-10-CM | POA: Diagnosis not present

## 2017-06-13 DIAGNOSIS — G8929 Other chronic pain: Secondary | ICD-10-CM | POA: Diagnosis not present

## 2017-06-13 DIAGNOSIS — R030 Elevated blood-pressure reading, without diagnosis of hypertension: Secondary | ICD-10-CM | POA: Diagnosis not present

## 2017-06-13 DIAGNOSIS — K219 Gastro-esophageal reflux disease without esophagitis: Secondary | ICD-10-CM | POA: Diagnosis not present

## 2017-06-13 LAB — CELL COUNT + DIFF, W/O CRYST-SYNVL FLD
BASOPHILS, %: 0 %
Eosinophils-Synovial: 0 % (ref 0–2)
LYMPHOCYTES-SYNOVIAL FLD: 7 % (ref 0–74)
MONOCYTE/MACROPHAGE: 6 % (ref 0–69)
Neutrophil, Synovial: 87 % — ABNORMAL HIGH (ref 0–24)
SYNOVIOCYTES, %: 0 % (ref 0–15)
WBC, Synovial: 9439 cells/uL — ABNORMAL HIGH (ref <150)

## 2017-06-15 ENCOUNTER — Other Ambulatory Visit: Payer: Self-pay | Admitting: Neurology

## 2017-06-15 DIAGNOSIS — H16002 Unspecified corneal ulcer, left eye: Secondary | ICD-10-CM | POA: Diagnosis not present

## 2017-06-15 DIAGNOSIS — H18812 Anesthesia and hypoesthesia of cornea, left eye: Secondary | ICD-10-CM | POA: Diagnosis not present

## 2017-06-15 DIAGNOSIS — H16232 Neurotrophic keratoconjunctivitis, left eye: Secondary | ICD-10-CM | POA: Diagnosis not present

## 2017-06-15 LAB — WOUND CULTURE
MICRO NUMBER:: 90177679
RESULT: NO GROWTH
SPECIMEN QUALITY:: ADEQUATE

## 2017-06-17 ENCOUNTER — Telehealth: Payer: Self-pay | Admitting: *Deleted

## 2017-06-17 MED ORDER — AMOXICILLIN-POT CLAVULANATE 875-125 MG PO TABS
1.0000 | ORAL_TABLET | Freq: Two times a day (BID) | ORAL | 0 refills | Status: DC
Start: 2017-06-17 — End: 2017-08-10

## 2017-06-17 NOTE — Progress Notes (Signed)
Subjective: Samuel Moran presents the office for follow-up evaluation of an ulceration to his right foot as well as swelling on the previous amputation site of the hallux.  This is been a chronic issue for him.  He denies any recent injury or trauma he denies any changes since last appointment.  Denies any redness or warmth to his foot that he has noticed he denies any red streaks.  He has been wearing a surgical shoe.  He states he has been active and he has not been resting his foot.  He has been taking antibiotics as well.  Denies any systemic complaints such as fevers, chills, nausea, vomiting. No acute changes since last appointment, and no other complaints at this time.   Objective: AAO x3, NAD DP/PT pulses palpable bilaterally, CRT less than 3 seconds Chronic swelling present to the previous hallux amputation site.  There is an ulceration on the plantar aspect of the stump but there is swelling.  There is no probing, undermining or tunneling.  There is granular wound base and there is no surrounding erythema there is no increase in warmth.  There is no areas of crepitation but there is fluid present within the area of swelling.  There is no other areas of edema, erythema, increased warmth and there is no significant tenderness palpation of the right foot. No open lesions or pre-ulcerative lesions.  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  -At this time we discussed the MRI of the area.  He declined this.  We also discussed an Unna boot to help with swelling of the wound but he declined this.  He wants me to go ahead and try to aspirate more fluid off the area as well as put a compression bandage on.  I discussed with him risks and benefits of doing this and he wishes to  proceed.  This adenopathy is not help anesthetize him but I did clean the area with Betadine and an 18-gauge needle was utilized to aspirate approximately 15 cc of fluid from the area.  There was no purulence identified however it appeared to be a light red color.  After I was able to aspirate the fluid that I could there was some swelling but I applied a compression bandage to the area.  It does not appear to be clinically infected this is a chronic issue for him.  He gets more from inflammation as opposed to infection from his course of antibiotics. -Recommend surgical shoe.  He does not want to wear this.  I discussed with him that if the wound worsens or there is infection he could end up losing his foot. -Patient encouraged to call the office with any questions, concerns, change in symptoms.   Trula Slade DPM

## 2017-06-17 NOTE — Telephone Encounter (Signed)
Left message informing pt that due to the importance of this message, I would leave it on his voicemail and I informed of Dr. Leigh Aurora review of results and orders, to call me to confirm he had received the message.

## 2017-06-17 NOTE — Telephone Encounter (Signed)
-----   Message from Trula Slade, DPM sent at 06/17/2017  7:50 AM EST ----- Culture is negative. I think this is more from inflammation as opposed to infection. However, continue antibiotics. Val- can you please let him know that I refilled the augmentin.

## 2017-06-18 NOTE — Telephone Encounter (Signed)
I informed pt of Dr. Wagoner's review of results and orders. 

## 2017-06-18 NOTE — Telephone Encounter (Signed)
Pt called and requested call back.

## 2017-06-29 ENCOUNTER — Ambulatory Visit: Payer: Medicare HMO | Admitting: Podiatry

## 2017-06-29 ENCOUNTER — Encounter: Payer: Self-pay | Admitting: Podiatry

## 2017-06-29 DIAGNOSIS — Z01812 Encounter for preprocedural laboratory examination: Secondary | ICD-10-CM

## 2017-06-29 DIAGNOSIS — L97519 Non-pressure chronic ulcer of other part of right foot with unspecified severity: Secondary | ICD-10-CM | POA: Diagnosis not present

## 2017-07-01 NOTE — Progress Notes (Signed)
Subjective: Samuel Moran presents the office for follow-up evaluation of an ulceration to his right foot as well as swelling on the previous amputation site of the hallux.  He states that it is about the same.  He does have significant drain the area again.  He still has a wound that he keeps bandage on occasionally but denies any drainage or pus or any redness or red streaks.  Overall he feels that his symptoms of the same he has finished his antibiotics.  He has no new concerns.  Denies any systemic complaints such as fevers, chills, nausea, vomiting. No acute changes since last appointment, and no other complaints at this time.   Objective: AAO x3, NAD DP/PT pulses palpable bilaterally, CRT less than 3 seconds Chronic swelling present to the previous hallux amputation site.  And there is fluid within the area but there is no significant erythema or increase in warmth in the area there is appears to be inflammatory fluid from the prior aspirations.  There is no increase in warmth or ascending cellulitis.  There is no crepitation.  There is an ulceration on the plantar distal portion measuring approximately 2 x 2 cm with a granular wound base without any probing, undermining or tunneling.  Previous surgical sites are all well-healed.  There is no other areas of edema, erythema, increased warmth and there is no significant tenderness palpation of the right foot. No open lesions or pre-ulcerative lesions.  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  -At this point he would like to aspirate the area again however we have done this twice without any significant improvement.  I discussed the MRI as well as surgical intervention again today.  At this point I think we need to do this.  I  ordered an MRI for surgical planning to rule out an abscess or osteomyelitis.  Discussed incision and drainage and wound debridement.  We will do this pending the MRI.  He continues to wear regular shoe. Recommended to wear surgical shoe/boot.  -Follow-up in 1 week or sooner if needed.  Call any questions or concerns meantime.  Trula Slade DPM

## 2017-07-06 ENCOUNTER — Telehealth: Payer: Self-pay | Admitting: *Deleted

## 2017-07-06 ENCOUNTER — Encounter: Payer: Self-pay | Admitting: Podiatry

## 2017-07-06 ENCOUNTER — Ambulatory Visit: Payer: Medicare HMO | Admitting: Podiatry

## 2017-07-06 VITALS — Temp 96.3°F

## 2017-07-06 DIAGNOSIS — L02611 Cutaneous abscess of right foot: Secondary | ICD-10-CM

## 2017-07-06 MED ORDER — DOXYCYCLINE HYCLATE 100 MG PO TABS: 100.0000 mg | ORAL_TABLET | Freq: Two times a day (BID) | ORAL | 0 refills | Status: DC

## 2017-07-06 NOTE — Telephone Encounter (Signed)
Pt states he was to get an antibiotic, but it is not at the pharmacy.

## 2017-07-06 NOTE — Progress Notes (Signed)
Subjective: Mr. Beckford presents the office for follow-up evaluation of an ulceration to his right foot as well as swelling on the previous amputation site of the hallux. He states that the area is doing about the same but he is swollen today.  He has noticed some clear to bloody drainage on the bandage coming from the wound but denies any pus or any red streaks.  Denies any redness or warmth. He is scheduled for an MRI next week.  Denies any systemic complaints such as fevers, chills, nausea, vomiting. No acute changes since last appointment, and no other complaints at this time.   Objective: AAO x3, NAD DP/PT pulses palpable bilaterally, CRT less than 3 seconds Chronic swelling present to the previous hallux amputation site.  Today the ulceration measures about the same size was 2 x 2 cm with a granular wound base however there is clear fluid coming from the wound today.  I did try to aspirate it today and serosanguineous fluid was expressed today.  I was able to utilize a hemostat to clean the wound and it did probe approximately 4 cm in all directions and very close to the remaining first metatarsal.  There is no frank purulence expressed.  There is no crepitation.  There is no ascending sialitis.  There is no increase in warmth of the area. No open lesions or pre-ulcerative lesions.  No pain with calf compression, swelling, warmth, erythema        Assessment: Ulceration right foot  Plan: -All treatment options discussed with the patient including all alternatives, risks, complications.  -I was able to debride the wound today and utilized a hemostat to decompress the wound to get serosanguineous drainage expressed.  Prior to this I did clean the area with Betadine and an 18-gauge needle was utilized to aspirate serosanguineous drainage as well. I again recommended incision and drainage as well as wound debridement.  I like to do this this week however he states that he will not do it this  week-because he has an Chrisandra Carota concert coming up but he is not been in this next Tuesday and he wants to be up to wear regular shoe.  He is scheduled for an MRI on Monday.  I will see him back Tuesday afternoon to go over the results and likely scheduled for surgery next Wednesday. -He is still wearing a regular shoe and I recommend to wear the surgical boot or shoe at all times to help offload the area. -I took a culture of the fluid I aspirated today -Doxycycline -Blood work ordered today -If any worsening he is to go immediately to the emergency room -Follow-up in 1 week or sooner if needed.  Call any questions or concerns meantime.  Trula Slade DPM

## 2017-07-06 NOTE — Telephone Encounter (Signed)
I informed pt the antibiotic had just been sent to the [facility].

## 2017-07-07 LAB — CBC WITH DIFFERENTIAL/PLATELET
BASOS ABS: 41 {cells}/uL (ref 0–200)
BASOS PCT: 0.6 %
EOS ABS: 469 {cells}/uL (ref 15–500)
Eosinophils Relative: 6.9 %
HEMATOCRIT: 32.8 % — AB (ref 38.5–50.0)
HEMOGLOBIN: 10.3 g/dL — AB (ref 13.2–17.1)
LYMPHS ABS: 1074 {cells}/uL (ref 850–3900)
MCH: 25.9 pg — AB (ref 27.0–33.0)
MCHC: 31.4 g/dL — AB (ref 32.0–36.0)
MCV: 82.4 fL (ref 80.0–100.0)
MONOS PCT: 9.1 %
MPV: 10.1 fL (ref 7.5–12.5)
NEUTROS ABS: 4597 {cells}/uL (ref 1500–7800)
Neutrophils Relative %: 67.6 %
Platelets: 336 10*3/uL (ref 140–400)
RBC: 3.98 10*6/uL — ABNORMAL LOW (ref 4.20–5.80)
RDW: 14.5 % (ref 11.0–15.0)
Total Lymphocyte: 15.8 %
WBC mixed population: 619 cells/uL (ref 200–950)
WBC: 6.8 10*3/uL (ref 3.8–10.8)

## 2017-07-07 LAB — SEDIMENTATION RATE: Sed Rate: 2 mm/h (ref 0–20)

## 2017-07-07 LAB — C-REACTIVE PROTEIN: CRP: 62.9 mg/L — ABNORMAL HIGH (ref <8.0)

## 2017-07-09 ENCOUNTER — Telehealth: Payer: Self-pay | Admitting: *Deleted

## 2017-07-09 LAB — WOUND CULTURE
MICRO NUMBER:: 90275325
SPECIMEN QUALITY:: ADEQUATE

## 2017-07-09 NOTE — Telephone Encounter (Signed)
Entered in error

## 2017-07-10 ENCOUNTER — Telehealth: Payer: Self-pay | Admitting: *Deleted

## 2017-07-10 NOTE — Telephone Encounter (Signed)
"  I'm calling to schedule my surgery."  I don't have any information regarding surgery for you.  I see he has you coming in for an appointment on Monday.  He may discuss scheduling surgery then.  "I'm scheduled to get my MRI results.  How soon after getting results does it take to get scheduled for surgery?"  You will not have a problem getting a date for your surgery.  "Okay, thank you."

## 2017-07-11 ENCOUNTER — Other Ambulatory Visit: Payer: Self-pay | Admitting: Podiatry

## 2017-07-11 MED ORDER — CIPROFLOXACIN HCL 500 MG PO TABS: 500.0000 mg | ORAL_TABLET | Freq: Two times a day (BID) | ORAL | 0 refills | Status: DC

## 2017-07-11 NOTE — Progress Notes (Signed)
Sent cipro due to wound culture Left voicemail on his cell phone

## 2017-07-13 ENCOUNTER — Ambulatory Visit
Admission: RE | Admit: 2017-07-13 | Discharge: 2017-07-13 | Disposition: A | Discharge status: Home / Self Care | Payer: Medicare HMO | Source: Ambulatory Visit | Attending: Podiatry | Admitting: Podiatry

## 2017-07-13 DIAGNOSIS — L97519 Non-pressure chronic ulcer of other part of right foot with unspecified severity: Secondary | ICD-10-CM

## 2017-07-13 DIAGNOSIS — R600 Localized edema: Secondary | ICD-10-CM | POA: Diagnosis not present

## 2017-07-13 MED ORDER — GADOBENATE DIMEGLUMINE 529 MG/ML IV SOLN
20.0000 mL | Freq: Once | INTRAVENOUS | Status: DC | PRN
Start: 2017-07-13 — End: 2017-07-14

## 2017-07-14 ENCOUNTER — Ambulatory Visit (INDEPENDENT_AMBULATORY_CARE_PROVIDER_SITE_OTHER): Payer: Medicare HMO

## 2017-07-14 ENCOUNTER — Ambulatory Visit (INDEPENDENT_AMBULATORY_CARE_PROVIDER_SITE_OTHER): Payer: Medicare HMO | Admitting: Podiatry

## 2017-07-14 DIAGNOSIS — L97519 Non-pressure chronic ulcer of other part of right foot with unspecified severity: Secondary | ICD-10-CM | POA: Diagnosis not present

## 2017-07-14 DIAGNOSIS — M869 Osteomyelitis, unspecified: Secondary | ICD-10-CM | POA: Diagnosis not present

## 2017-07-14 NOTE — Patient Instructions (Signed)

## 2017-07-17 ENCOUNTER — Encounter: Payer: Self-pay | Admitting: Podiatry

## 2017-07-17 ENCOUNTER — Telehealth: Payer: Self-pay | Admitting: *Deleted

## 2017-07-17 ENCOUNTER — Ambulatory Visit: Payer: Self-pay | Admitting: *Deleted

## 2017-07-17 DIAGNOSIS — L02611 Cutaneous abscess of right foot: Secondary | ICD-10-CM | POA: Diagnosis not present

## 2017-07-17 DIAGNOSIS — L97514 Non-pressure chronic ulcer of other part of right foot with necrosis of bone: Secondary | ICD-10-CM | POA: Diagnosis not present

## 2017-07-17 DIAGNOSIS — M86671 Other chronic osteomyelitis, right ankle and foot: Secondary | ICD-10-CM | POA: Diagnosis not present

## 2017-07-17 DIAGNOSIS — M8668 Other chronic osteomyelitis, other site: Secondary | ICD-10-CM | POA: Diagnosis not present

## 2017-07-17 DIAGNOSIS — I96 Gangrene, not elsewhere classified: Secondary | ICD-10-CM | POA: Diagnosis not present

## 2017-07-17 DIAGNOSIS — E11621 Type 2 diabetes mellitus with foot ulcer: Secondary | ICD-10-CM | POA: Diagnosis not present

## 2017-07-17 DIAGNOSIS — L97509 Non-pressure chronic ulcer of other part of unspecified foot with unspecified severity: Secondary | ICD-10-CM | POA: Diagnosis not present

## 2017-07-17 DIAGNOSIS — L02612 Cutaneous abscess of left foot: Secondary | ICD-10-CM | POA: Diagnosis not present

## 2017-07-17 DIAGNOSIS — K219 Gastro-esophageal reflux disease without esophagitis: Secondary | ICD-10-CM | POA: Diagnosis not present

## 2017-07-17 DIAGNOSIS — M86171 Other acute osteomyelitis, right ankle and foot: Secondary | ICD-10-CM | POA: Diagnosis not present

## 2017-07-17 NOTE — Progress Notes (Signed)
Subjective: Samuel Moran presents the office for follow-up evaluation of an ulceration to his right foot as well as swelling on the previous amputation site of the hallux.  He states he still having quite a bit of swelling to the area.  He denies any redness or warmth.  No some bloody drainage on the bandage.  He is continued with the ciprofloxacin that I called in for him over the weekend.  Overall he feels well he denies any systemic complaints including fevers, chills, nausea, vomiting.  Denies any calf pain, chest pain, shortness of breath.  He still has swelling to his foot.  He has no other concerns today.  Objective: AAO x3, NAD DP/PT pulses palpable bilaterally, CRT less than 3 seconds Chronic swelling present to the previous hallux amputation site.  Today the ulceration measures about the same size was 2 x 2 cm with a granular wound base however there is clear to bloody fluid coming from the wound today.  There is no increase in warmth to the foot there is no erythema.  There is no fluctuation or crepitation.  The wound still probes very closely metatarsal.  Upon probing the wound unable to get any frank purulence coming from the area.  There is no abscess otherwise clinically to the foot.  Overall the foot is swollen but there is no erythema or ascending cellulitis. No open lesions or pre-ulcerative lesions.  No pain with calf compression, swelling, warmth, erythema  MRI Right foot: Findings consistent with osteomyelitis throughout the first metatarsal remnant and majority of the fourth metatarsal remnant. Somewhat milder degree of edema and enhancement in the proximal 4.2 cm of the fifth metatarsal is also worrisome for osteomyelitis.  Edema and enhancement about the tarsometatarsal joints is worst at the second third and likely degenerative or neuropathic.  Marrow edema and enhancement in the lateral periphery of the cuboid worrisome for osteomyelitis.  Large rim enhancing fluid  collections distal to the first and fourth metatarsal remnants most consistent with abscesses.    Assessment: Ulceration right foot-concern for osteomyelitis  Plan: -All treatment options discussed with the patient including all alternatives, risks, complications.  -MRI results were discussed the patient.  At this time strongly recommend surgical intervention.  Discussed this in the last couple of appointments.  At this time this is warranted and we need to do this quickly.  Discussed with him incision and drainage as well as wound excision on the right foot as well as bone biopsies of the bones that are in question of the osteomyelitis.  We discussed the surgery as well as the postoperative course.  I discussed this is not a guarantee resolution of symptoms.  We also discussed potential for amputation. -I did do repeat x-rays.  Overall the bone structure does not appear to be changed there is no definitive evidence of cortical destruction or osteomyelitis on x-ray.  I compare this x-ray to the old x-rays.  I do not think this is acute osteomyelitis discussed with the cuboid and the fourth metatarsal I think this is likely from chronic swelling as well as the old surgeries especially metatarsals. -The incision placement as well as the postoperative course was discussed with the patient. I discussed risks of the surgery which include, but not limited to, infection, bleeding, pain, swelling, need for further surgery, delayed or nonhealing, painful or ugly scar, numbness or sensation changes, over/under correction, recurrence, transfer lesions, further deformity, hardware failure, DVT/PE, loss of toe/foot. Patient understands these risks and wishes to  proceed with surgery. The surgical consent was reviewed with the patient all 3 pages were signed. No promises or guarantees were given to the outcome of the procedure. All questions were answered to the best of my ability. Before the surgery the patient was  encouraged to call the office if there is any further questions. The surgery will be performed at the [facility] on an outpatient basis.  Trula Slade DPM

## 2017-07-17 NOTE — Telephone Encounter (Signed)
Patient phoned in after foot surgery today. The heart monitor read Afib thru-out his surgery.  This is new for him. Reviewed symptoms of Afib, which he denies he has any of them. Earliest appointment with Dr. Raliegh Ip was 07/22/17. He would like a cardiology referral ASAP, please. He will seek treatment if any of the signs we discussed, are noticed.

## 2017-07-17 NOTE — Telephone Encounter (Signed)
Faxed 07/17/2017 EKG strip to Dr. Bluford Kaufmann with notice as Urgent.

## 2017-07-17 NOTE — Progress Notes (Signed)
Samuel Moran was seen today in the movement disorders clinic for neurologic consultation at the request of Samuel Lor, MD.  The consultation is for the evaluation of R hand tremor and gait change x 6 months.  Tremor is noted at rest.  He is R hand dominant.  Tremor is gone when he picks up arm or makes a fist.  Pt states that he takes methotrexate and he noted that after he takes it he will shuffle for a few days and not swing the arms.  Dr. Lenna Moran, therefore, took him off of the medication 3 weeks ago and he feels that walking is a bit better.  The records that were made available to me were reviewed.  Does report hx of seizure in 2008 and was at self checkout and couldn't figure out how to do steps.  Got finished, walked to car and next thing he remembers is being in ambulance.  Apparently, got in car and had death grip on wheel.  EMS got there and he was confused and wouldn't unlock door.  They broke window and took him to hospital.  W/U negative except "white spot" on brain.  03/14/16 update:  Pt returns for f/u much earlier than expected.  Has dx of PD, and last visit he didn't want to start medication.  He decided almost immediately after the visit that he wanted to start medication but he wanted to come back and discuss and discuss the medication in more detail.  Has a myriad of questions today.  Insurance company denied authorization for his MRI brain.  07/03/16 update:  Patient follows up today.  I started him on pramipexole last visit.  He called me in February to state that he had no side effects with medication, but his friends brother-in-law had a sleep attack on the medication and he was concerned about the potential side effect.  We had discussed this potential side effect previously.  Since he was side effect free, I told him that I would recommend that he continue on the medication.  2 weeks later he called to tell me that he was sleeping all of the time on the medication.    Today he states that it was really sleepiness but "spaciness" and "I wasn't all there."   I told him just to discontinue the medication and we could talk about it at follow-up.  I did get a note from his rheumatologist, Dr. Trudie Moran, dated 06/30/2016 that stated that the patient had an acute rheumatoid arthritis flareup.  She thought that it was because he discontinued his methotrexate.  The patient did that because he thought methotrexate was causing dizziness, but she did not think that was the case and thought it was the fact that he was taking a different version of folic acid.  She restarted methotrexate and folic acid.  Thinks that he is moving around okay short of the RA flare.  He is riding the bike for 10 min 3 times a week.  No falls.  No lightheadedness or near syncope.  Mood has been okay.    09/03/16 update:  Patient seen today in follow-up.  He is not on any Parkinson's medication, which was his desire.  He got off of methotrexate, which he felt caused side effects.  He is now on leflunomide and would like to get back on medications for Parkinson's disease.  I just got a note from his rheumatologist that the patient wanted to wait on restarting methotrexate until  he started his Parkinson's medicine.  The reason we waited last visit to restart his Parkinson's medicine was because he was getting started back on rheumatoid medicine.  He brings medication pamphlet on rheumatoid meds he is considering so that meds we choose don't interfere.   The patient is having more trouble with tremor and arm swing.  He has not had any falls.  He has not had any lightheadedness or near syncope.  He denies significant depression.  12/09/16 update:  Patient seen in follow-up.  He was started on Neupro last visit.  He is now on 4 mg daily.  He has had no sleep attacks.  No compulsive behaviors.  No site rxn's. He thinks that it controls tremor well.  He did miss the medication on Sunday and then noted more tremor on Monday.   He had one fall in [place] but he caught his foot on a cushion; he didn't get hurt.  Pt denies lightheadedness, near syncope.  No hallucinations.  Mood has been good.  He is exercising on the stationary bike in the gym 4 days a week (11 min per time).    04/10/17 update: Patient seen in follow-up for Parkinson's disease.  He is on the Neupro patch, 4 mg daily. No site reactions. He called me last month to state that he felt his tremors worse.  He wanted me to adjust his Neupro.  I told him I wanted to see him first, as I already knew he had an pending appointment.  He has had no falls.   He had a single instance since last visit of lightheadedness/dizziness but he accidentally was wearing 2 neupro patches. I have reviewed records available to me since last visit.  He was seen in orthopedic surgeon who does spine for low back pain.  He has requested a referral to a neurosurgeon, which has been made by his primary care physician.  07/20/17 update: Patient is seen in follow-up for Parkinson's disease.  He is on Neupro patch, 4 mg daily.  He forgot to put it on today.  No site reactions.    Last visit, levodopa was added.  He is on carbidopa/levodopa 25/100, 1 tablet 3 times per day. He has not taken that today.   He reports that he feels he may be progressing with tremor.  The records that were made available to me were reviewed.  Been to podiatry multiple times since our last visit due to foot ulcer and had surgery for I&D on Friday.  MRI demonstrated findings consistent with osteomyelitis but patient states that he was told it didn't show infection.  Had to stop exercise because of foot pain.  Was previously doing stationary cycle.  Hands and feet feel stiff and has trouble differentiating his RA with PD.   PREVIOUS MEDICATIONS: Pramipexole (patient had a friend who had a sleep attack on the medication and subsequently the patient developed sleepiness on the medication and we decided to  discontinue)  ALLERGIES:  No Known Allergies  CURRENT MEDICATIONS:  Outpatient Encounter Medications as of 07/20/2017  Medication Sig  . amoxicillin-clavulanate (AUGMENTIN) 875-125 MG tablet Take 1 tablet by mouth 2 (two) times daily.  . carbidopa-levodopa (SINEMET IR) 25-100 MG tablet Take 1 tablet by mouth 3 (three) times daily.  Marland Kitchen doxycycline (VIBRA-TABS) 100 MG tablet Take 1 tablet (100 mg total) by mouth 2 (two) times daily.  Marland Kitchen ibuprofen (ADVIL,MOTRIN) 800 MG tablet Take 800 mg by mouth as needed.  . leflunomide (ARAVA) 20 MG  tablet Take 20 mg by mouth daily.  . Multiple Vitamins-Minerals (CENTRUM SILVER PO) Take by mouth daily.    . mupirocin ointment (BACTROBAN) 2 % Apply 1 application topically 2 (two) times daily.  Marland Kitchen NEUPRO 4 MG/24HR PLACE 1 PATCH ONTO THE SKIN DAILY.  Marland Kitchen Omega-3 Fatty Acids (FISH OIL) 1000 MG CAPS Take by mouth.  Marland Kitchen omeprazole (PRILOSEC) 20 MG capsule Take 20 mg by mouth daily.  . [DISCONTINUED] ciprofloxacin (CIPRO) 500 MG tablet Take 1 tablet (500 mg total) by mouth 2 (two) times daily.  . [DISCONTINUED] pantoprazole (PROTONIX) 20 MG tablet Take 20 mg by mouth daily.   Facility-Administered Encounter Medications as of 07/20/2017  Medication  . 0.9 %  sodium chloride infusion    PAST MEDICAL HISTORY:   Past Medical History:  Diagnosis Date  . ED (erectile dysfunction)   . GERD 07/01/2007  . Parkinson's disease ([place])   . 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 PROSTATE SPECIFIC ANTIGEN, ELEVATED 06/29/2007  . Rheumatoid arthritis ([place])   . SEIZURE DISORDER 07/01/2007   one epsiode only  . Spinal stenosis of lumbar region 09/22/2014    PAST SURGICAL HISTORY:   Past Surgical History:  Procedure Laterality Date  . AMPUTATION     right great toe - tramatic age 21  . DEBRIDEMENT TOE Right 07/2016  . HAND SURGERY  03/2017   removal cysts  . IR FLUORO GUIDE CV LINE RIGHT  10/16/2016  . IR US GUIDE VASC ACCESS RIGHT  10/16/2016    SOCIAL HISTORY:   Social  History   Socioeconomic History  . Marital status: Single    Spouse name: Not on file  . Number of children: Not on file  . Years of education: Not on file  . Highest education level: Not on file  Social Needs  . Financial resource strain: Not on file  . Food insecurity - worry: Not on file  . Food insecurity - inability: Not on file  . Transportation needs - medical: Not on file  . Transportation needs - non-medical: Not on file  Occupational History  . Occupation: Chartered certified accountant  Tobacco Use  . Smoking status: Light Tobacco Smoker    Types: Cigars  . Smokeless tobacco: Never Used  . Tobacco comment: 1 cigar a week  Substance and Sexual Activity  . Alcohol use: Yes    Alcohol/week: 2.0 oz    Types: 4 Standard drinks or equivalent per week    Comment: one drink a day (liquor)  . Drug use: No  . Sexual activity: Not on file  Other Topics Concern  . Not on file  Social History Narrative  . Not on file    FAMILY HISTORY:   Family Status  Relation Name Status  . Mother  Deceased  . Father  Deceased  . Child adopted Alive  . Neg Hx  (Not Specified)    ROS:   A complete 10 system review of systems was obtained and was unremarkable apart from what is mentioned above.  PHYSICAL EXAMINATION:    VITALS:   Vitals:   07/20/17 0834  BP: (!) 178/78  Pulse: 92  SpO2: 96%  Weight: 223 lb (101.2 kg)  Height: 5' 11"  (1.803 m)    GEN:  The patient appears stated age and is in NAD. HEENT:  Normocephalic, atraumatic.  The mucous membranes are moist. The superficial temporal arteries are without ropiness or tenderness. CV:  RRR Lungs:  CTAB Neck/HEME:  There are no carotid bruits bilaterally. MS:  R foot in a boot and toes/feet wrapped in gauze. Derm:  There are nodules over the PIP joints.  No skin site reactions where patches placed.  He has some wounds on R hand where rheumatoid nodules were taken off.    Neurological examination:  Orientation: The patient is alert and  oriented x3.  Cranial nerves: There is good facial symmetry.  There is facial hypomimia.  The speech is fluent and clear.  He is hypophonic.  Soft palate rises symmetrically and there is no tongue deviation. Hearing is intact to conversational tone. Sensation: Sensation is intact to light touch throughout. Motor: Strength is at least antigravity x4.  Movement examination: Tone: There is no rigidity in the LUE and and mild in the LUE (didn't take medication today) Abnormal movements: there is tremor in the RUE with ambulation only.   No tremor on the L or in the legs Coordination:  There is no decremation today.  Cannot do foot taps/heel taps on the L due to having a boot on. Gait and Station: The patient pushes off of the chair to arise.  He has a large boot on the right foot, which makes ambulation antalgic.  Otherwise, stride length is actually fairly good as his arm swing.  There is re-emergent tremor in the right upper extremity.  LABS    Chemistry      Component Value Date/Time   NA 136 02/26/2017 0922   NA 139 07/08/2016 1158   K 3.8 02/26/2017 0922   CL 101 02/26/2017 0922   CO2 28 02/26/2017 0922   BUN 24 (H) 02/26/2017 0922   BUN 22 07/08/2016 1158   CREATININE 0.87 02/26/2017 0922   CREATININE 0.98 10/14/2016 1048      Component Value Date/Time   CALCIUM 8.8 02/26/2017 0922   ALKPHOS 49 02/26/2017 0922   AST 22 02/26/2017 0922   ALT 14 02/26/2017 0922   BILITOT 0.8 02/26/2017 0922     Lab Results  Component Value Date   TSH 1.59 02/26/2017   No results found for: VITAMINB12   ASSESSMENT/PLAN:  1.  Idiopathic Parkinson's disease  -We discussed the diagnosis as well as pathophysiology of the disease.  We discussed treatment options as well as prognostic indicators.  Patient education was provided.  -continue neupro, 4 mg daily.  Discussed r/b/se of the medication extensively  -Continue with carbidopa/levodopa 25/100, 1 tablet 2 times per day.  He did not take the  medication today, so it is difficult to see whether or not this was efficacious.  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  We discussed what that meant.  -He asks about DBS therapy.  I talked to the patient about the logistics associated with DBS therapy, although I do not think that he needs that right now.  We discussed the benefits as well as the risks of the surgery and discussed what it helps with and also what it does not help with.  2.  Rheumatoid arthritis  -seeing Dr. Trudie Moran.  He was advised on getting new injections but copay was too expensive (was $200).  He wasn't sure how often he would need to have the shot.  3.  Follow up is anticipated in the next few months, sooner should new neurologic issues arise.  Much greater than 50% of this visit was spent in counseling and coordinating care.  Total face to face time:  25 min     Cc:  Samuel Lor, MD

## 2017-07-17 NOTE — Telephone Encounter (Signed)
Dr. Jacqualyn Posey states pt had rate controlled A-Fib prior to surgery, would like the EKG strip faxed to Dr. Bluford Kaufmann

## 2017-07-18 ENCOUNTER — Telehealth: Payer: Self-pay | Admitting: *Deleted

## 2017-07-18 NOTE — Telephone Encounter (Signed)
Called the patient today and stated that I was calling to check up on him to see how he was doing after surgery and the patient stated that he slept a good 10 hours and has been taken the ibuprofen and there was no fever or chills, no nausea and that there was shooting pain that patient feels every now and then and tender at times and there is some pain on top of his right foot and is elevating and does have one tablet of doxycycline left which patient will finish and has 11 tablets of cipro left and stated to take as Dr Jacqualyn Posey stated to do and I stated to the patient to call if any concerns or questions and the patient stated that he would and that he would see [place] on Monday 07-20-17 at 9:45 am. Lattie Haw

## 2017-07-20 ENCOUNTER — Ambulatory Visit: Payer: Medicare HMO | Admitting: Neurology

## 2017-07-20 ENCOUNTER — Encounter: Payer: Self-pay | Admitting: Neurology

## 2017-07-20 ENCOUNTER — Ambulatory Visit (INDEPENDENT_AMBULATORY_CARE_PROVIDER_SITE_OTHER): Payer: Medicare HMO

## 2017-07-20 ENCOUNTER — Ambulatory Visit (INDEPENDENT_AMBULATORY_CARE_PROVIDER_SITE_OTHER): Payer: Medicare HMO | Admitting: Podiatry

## 2017-07-20 VITALS — BP 178/78 | HR 92 | Ht 71.0 in | Wt 223.0 lb

## 2017-07-20 DIAGNOSIS — L97519 Non-pressure chronic ulcer of other part of right foot with unspecified severity: Secondary | ICD-10-CM | POA: Diagnosis not present

## 2017-07-20 DIAGNOSIS — G20 Parkinson's disease: Secondary | ICD-10-CM

## 2017-07-20 DIAGNOSIS — M86171 Other acute osteomyelitis, right ankle and foot: Secondary | ICD-10-CM | POA: Diagnosis not present

## 2017-07-20 DIAGNOSIS — M869 Osteomyelitis, unspecified: Secondary | ICD-10-CM

## 2017-07-20 DIAGNOSIS — L02611 Cutaneous abscess of right foot: Secondary | ICD-10-CM

## 2017-07-20 MED ORDER — DOXYCYCLINE HYCLATE 100 MG PO TABS: 100.0000 mg | ORAL_TABLET | Freq: Two times a day (BID) | ORAL | 0 refills | Status: DC

## 2017-07-20 NOTE — Patient Instructions (Signed)
  [facility]'s [facility]  The [facility] team know that living well with a movement disorder extends far beyond our clinic walls. We are together with you. Our team is passionate about providing resources to you and your loved ones who are living with Parkinson's disease and movement disorders. Participate in these programs and join our community. These resources are free or low cost!   [facility] is adding:   Innovative educational programs for patients and caregivers.   Support groups for patients and caregivers living with Parkinson's disease.   Parkinson's specific exercise programs.   Custom tailored therapeutic programs that will benefit patient's living with Parkinson's disease.   We are in this together. You can help and contribute to grow these programs and resources in our community. 100% of the funds donated to the Movement Disorders Fund stays right here in our community to support patients and their caregivers.  To make a tax deductible contribution:  -ask for a [facility] Parkinson's envelope in the office today.  - call the Office of Institutional Advancement at [phone].         

## 2017-07-20 NOTE — Telephone Encounter (Signed)
Spoke to patient and someone scheduled him an appointment. 07/22/2017

## 2017-07-21 ENCOUNTER — Telehealth: Payer: Self-pay | Admitting: *Deleted

## 2017-07-21 DIAGNOSIS — M869 Osteomyelitis, unspecified: Secondary | ICD-10-CM

## 2017-07-21 DIAGNOSIS — L02611 Cutaneous abscess of right foot: Secondary | ICD-10-CM

## 2017-07-21 DIAGNOSIS — L97519 Non-pressure chronic ulcer of other part of right foot with unspecified severity: Secondary | ICD-10-CM

## 2017-07-21 NOTE — Telephone Encounter (Signed)
-----   Message from Trula Slade, DPM sent at 07/20/2017  8:16 PM EDT ----- Can you please put in for a Infectious diease consult for osteomyelitis? Thanks.

## 2017-07-21 NOTE — Telephone Encounter (Signed)
Faxed required form, clinicals and demographics for 6 months to Infectious Disease.

## 2017-07-21 NOTE — Progress Notes (Signed)
Subjective: Samuel Moran is a 72 y.o. is seen today in office s/p bone biopsy of multiple metatarsals, cuboid and for excision of wound and I&D preformed on 07/18/2017. He states that his pain is controlled.  He is remained in the cam boot and try to be nonweightbearing as much as possible.  He has continued on ciprofloxacin but he is right out of doxycycline.  Denies any systemic complaints such as fevers, chills, nausea, vomiting. No calf pain, chest pain, shortness of breath.   Objective: General: No acute distress, AAOx3  DP/PT pulses palpable 2/4, CRT < 3 sec to all digits.  Protective sensation intact. Motor function intact.  Right foot: Incision is well coapted without any evidence of dehiscence and sutures are intact.  Along the plantar submetatarsal 1 area is packed open with a saline wet-to-dry gauze.  Upon removal there is some serosanguineous drainage is expressed but there is no purulence.  There is probing to bone at this time.  There is no surrounding erythema, ascending cellulitis, fluctuance, crepitus, malodor, drainage/purulence. There is decreased edema around the surgical site however there is still swelling to the foot. There is no pain along the surgical site.  No other areas of tenderness to bilateral lower extremities.  No other open lesions or pre-ulcerative lesions.  No pain with calf compression, swelling, warmth, erythema.   Assessment and Plan:  Status post bone biopsy, wound excision, I&D right foot  -Treatment options discussed including all alternatives, risks, and complications -X-rays were obtained and reviewed it does reveal osteolyses of the distal first metatarsal which is new compared to the last x-rays.  I did discuss this with him.  There is no soft tissue emphysema present. -The wound submetatarsal wound was cleaned today.  It was repacked by Betadine.  The remainder of the incisions were also dressed with a dry sterile dressing. -Continue doxycycline  and ciprofloxacin for now.  We are awaiting bone cultures as well as wound culture.  Go ahead and place a infectious disease consultation for osteomyelitis multiple areas of his foot.  I did discuss with him likely going back to surgery in the near future to resect the first metatarsal to remove the likely infected bone but I want to get the cultures to the other both before going back into surgery. -Continue cam boot and nonweightbearing. -Monitor for any clinical signs or symptoms of infection and directed to call the office immediately should any occur or go to the ER. -RTC 1 week or sooner if needed.  Trula Slade DPM

## 2017-07-21 NOTE — Addendum Note (Signed)
Addended by: Harriett Sine D on: 07/21/2017 10:38 AM   Modules accepted: Orders

## 2017-07-22 ENCOUNTER — Encounter: Payer: Self-pay | Admitting: Internal Medicine

## 2017-07-22 ENCOUNTER — Ambulatory Visit (INDEPENDENT_AMBULATORY_CARE_PROVIDER_SITE_OTHER): Payer: Medicare HMO | Admitting: Internal Medicine

## 2017-07-22 DIAGNOSIS — I481 Persistent atrial fibrillation: Secondary | ICD-10-CM

## 2017-07-22 DIAGNOSIS — I4819 Other persistent atrial fibrillation: Secondary | ICD-10-CM

## 2017-07-22 NOTE — Patient Instructions (Addendum)
Limit your sodium (Salt) intake   DASH Eating Plan DASH stands for "Dietary Approaches to Stop Hypertension." The DASH eating plan is a healthy eating plan that has been shown to reduce high blood pressure (hypertension). It may also reduce your risk for type 2 diabetes, heart disease, and stroke. The DASH eating plan may also help with weight loss. What are tips for following this plan? General guidelines  Avoid eating more than 2,300 mg (milligrams) of salt (sodium) a day. If you have hypertension, you may need to reduce your sodium intake to 1,500 mg a day.  Limit alcohol intake to no more than 1 drink a day for nonpregnant women and 2 drinks a day for men. One drink equals 12 oz of beer, 5 oz of wine, or 1 oz of hard liquor.  Work with your health care provider to maintain a healthy body weight or to lose weight. Ask what an ideal weight is for you.  Get at least 30 minutes of exercise that causes your heart to beat faster (aerobic exercise) most days of the week. Activities may include walking, swimming, or biking.  Work with your health care provider or diet and nutrition specialist (dietitian) to adjust your eating plan to your individual calorie needs. Reading food labels  Check food labels for the amount of sodium per serving. Choose foods with less than 5 percent of the Daily Value of sodium. Generally, foods with less than 300 mg of sodium per serving fit into this eating plan.  To find whole grains, look for the word "whole" as the first word in the ingredient list. Shopping  Buy products labeled as "low-sodium" or "no salt added."  Buy fresh foods. Avoid canned foods and premade or frozen meals. Cooking  Avoid adding salt when cooking. Use salt-free seasonings or herbs instead of table salt or sea salt. Check with your health care provider or pharmacist before using salt substitutes.  Do not fry foods. Cook foods using healthy methods such as baking, boiling, grilling,  and broiling instead.  Cook with heart-healthy oils, such as olive, canola, soybean, or sunflower oil. Meal planning   Eat a balanced diet that includes: ? 5 or more servings of fruits and vegetables each day. At each meal, try to fill half of your plate with fruits and vegetables. ? Up to 6-8 servings of whole grains each day. ? Less than 6 oz of lean meat, poultry, or fish each day. A 3-oz serving of meat is about the same size as a deck of cards. One egg equals 1 oz. ? 2 servings of low-fat dairy each day. ? A serving of nuts, seeds, or beans 5 times each week. ? Heart-healthy fats. Healthy fats called Omega-3 fatty acids are found in foods such as flaxseeds and coldwater fish, like sardines, salmon, and mackerel.  Limit how much you eat of the following: ? Canned or prepackaged foods. ? Food that is high in trans fat, such as fried foods. ? Food that is high in saturated fat, such as fatty meat. ? Sweets, desserts, sugary drinks, and other foods with added sugar. ? Full-fat dairy products.  Do not salt foods before eating.  Try to eat at least 2 vegetarian meals each week.  Eat more home-cooked food and less restaurant, buffet, and fast food.  When eating at a restaurant, ask that your food be prepared with less salt or no salt, if possible. What foods are recommended? The items listed may not be a complete  list. Talk with your dietitian about what dietary choices are best for you. Grains Whole-grain or whole-wheat bread. Whole-grain or whole-wheat pasta. Brown rice. [place]. Bulgur. Whole-grain and low-sodium cereals. Pita bread. Low-fat, low-sodium crackers. Whole-wheat flour tortillas. Vegetables Fresh or frozen vegetables (raw, steamed, roasted, or grilled). Low-sodium or reduced-sodium tomato and vegetable juice. Low-sodium or reduced-sodium tomato sauce and tomato paste. Low-sodium or reduced-sodium canned vegetables. Fruits All fresh, dried, or frozen fruit.  Canned fruit in natural juice (without added sugar). Meat and other protein foods Skinless chicken or [place]. Ground chicken or [place]. Pork with fat trimmed off. Fish and seafood. Egg whites. Dried beans, peas, or lentils. Unsalted nuts, nut butters, and seeds. Unsalted canned beans. Lean cuts of beef with fat trimmed off. Low-sodium, lean deli meat. Dairy Low-fat (1%) or fat-free (skim) milk. Fat-free, low-fat, or reduced-fat cheeses. Nonfat, low-sodium ricotta or cottage cheese. Low-fat or nonfat yogurt. Low-fat, low-sodium cheese. Fats and oils Soft margarine without trans fats. Vegetable oil. Low-fat, reduced-fat, or light mayonnaise and salad dressings (reduced-sodium). Canola, safflower, olive, soybean, and sunflower oils. Avocado. Seasoning and other foods Herbs. Spices. Seasoning mixes without salt. Unsalted popcorn and pretzels. Fat-free sweets. What foods are not recommended? The items listed may not be a complete list. Talk with your dietitian about what dietary choices are best for you. Grains Baked goods made with fat, such as croissants, muffins, or some breads. Dry pasta or rice meal packs. Vegetables Creamed or fried vegetables. Vegetables in a cheese sauce. Regular canned vegetables (not low-sodium or reduced-sodium). Regular canned tomato sauce and paste (not low-sodium or reduced-sodium). Regular tomato and vegetable juice (not low-sodium or reduced-sodium). Angie Fava. Olives. Fruits Canned fruit in a light or heavy syrup. Fried fruit. Fruit in cream or butter sauce. Meat and other protein foods Fatty cuts of meat. Ribs. Fried meat. Berniece Salines. Sausage. Bologna and other processed lunch meats. Salami. Fatback. Hotdogs. Bratwurst. Salted nuts and seeds. Canned beans with added salt. Canned or smoked fish. Whole eggs or egg yolks. Chicken or [place] with skin. Dairy Whole or 2% milk, cream, and half-and-half. Whole or full-fat cream cheese. Whole-fat or sweetened yogurt. Full-fat  cheese. Nondairy creamers. Whipped toppings. Processed cheese and cheese spreads. Fats and oils Butter. Stick margarine. Lard. Shortening. Ghee. Bacon fat. Tropical oils, such as coconut, palm kernel, or palm oil. Seasoning and other foods Salted popcorn and pretzels. Onion salt, garlic salt, seasoned salt, table salt, and sea salt. Worcestershire sauce. Tartar sauce. Barbecue sauce. Teriyaki sauce. Soy sauce, including reduced-sodium. Steak sauce. Canned and packaged gravies. Fish sauce. Oyster sauce. Cocktail sauce. Horseradish that you find on the shelf. Ketchup. Mustard. Meat flavorings and tenderizers. Bouillon cubes. Hot sauce and Tabasco sauce. Premade or packaged marinades. Premade or packaged taco seasonings. Relishes. Regular salad dressings. Where to find more information:  National Heart, Lung, and [place]: https://wilson-eaton.com/  American Heart Association: www.heart.org Summary  The DASH eating plan is a healthy eating plan that has been shown to reduce high blood pressure (hypertension). It may also reduce your risk for type 2 diabetes, heart disease, and stroke.  With the DASH eating plan, you should limit salt (sodium) intake to 2,300 mg a day. If you have hypertension, you may need to reduce your sodium intake to 1,500 mg a day.  When on the DASH eating plan, aim to eat more fresh fruits and vegetables, whole grains, lean proteins, low-fat dairy, and heart-healthy fats.  Work with your health care provider or diet and nutrition specialist (dietitian) to  adjust your eating plan to your individual calorie needs. This information is not intended to replace advice given to you by your health care provider. Make sure you discuss any questions you have with your health care provider. Document Released: 04/10/2011 Document Revised: 04/14/2016 Document Reviewed: 04/14/2016 Elsevier Interactive Patient Education  2018 [employer]. Atrial Fibrillation Atrial fibrillation is a type  of irregular or rapid heartbeat (arrhythmia). In atrial fibrillation, the heart quivers continuously in a chaotic pattern. This occurs when parts of the heart receive disorganized signals that make the heart unable to pump blood normally. This can increase the risk for stroke, heart failure, and other heart-related conditions. There are different types of atrial fibrillation, including:  Paroxysmal atrial fibrillation. This type starts suddenly, and it usually stops on its own shortly after it starts.  Persistent atrial fibrillation. This type often lasts longer than a week. It may stop on its own or with treatment.  Long-lasting persistent atrial fibrillation. This type lasts longer than 12 months.  Permanent atrial fibrillation. This type does not go away.  Talk with your health care provider to learn about the type of atrial fibrillation that you have. What are the causes? This condition is caused by some heart-related conditions or procedures, including:  A heart attack.  Coronary artery disease.  Heart failure.  Heart valve conditions.  High blood pressure.  Inflammation of the sac that surrounds the heart (pericarditis).  Heart surgery.  Certain heart rhythm disorders, such as Freyre-Parkinson-White syndrome.  Other causes include:  Pneumonia.  Obstructive sleep apnea.  Blockage of an artery in the lungs (pulmonary embolism, or PE).  Lung cancer.  Chronic lung disease.  Thyroid problems, especially if the thyroid is overactive (hyperthyroidism).  Caffeine.  Excessive alcohol use or illegal drug use.  Use of some medicines, including certain decongestants and diet pills.  Sometimes, the cause cannot be found. What increases the risk? This condition is more likely to develop in:  People who are older in age.  People who smoke.  People who have diabetes mellitus.  People who are overweight (obese).  Athletes who exercise vigorously.  What are the signs  or symptoms? Symptoms of this condition include:  A feeling that your heart is beating rapidly or irregularly.  A feeling of discomfort or pain in your chest.  Shortness of breath.  Sudden light-headedness or weakness.  Getting tired easily during exercise.  In some cases, there are no symptoms. How is this diagnosed? Your health care provider may be able to detect atrial fibrillation when taking your pulse. If detected, this condition may be diagnosed with:  An electrocardiogram (ECG).  A Holter monitor test that records your heartbeat patterns over a 24-hour period.  Transthoracic echocardiogram (TTE) to evaluate how blood flows through your heart.  Transesophageal echocardiogram (TEE) to view more detailed images of your heart.  A stress test.  Imaging tests, such as a CT scan or chest X-ray.  Blood tests.  How is this treated? The main goals of treatment are to prevent blood clots from forming and to keep your heart beating at a normal rate and rhythm. The type of treatment that you receive depends on many factors, such as your underlying medical conditions and how you feel when you are experiencing atrial fibrillation. This condition may be treated with:  Medicine to slow down the heart rate, bring the heart's rhythm back to normal, or prevent clots from forming.  Electrical cardioversion. This is a procedure that resets your heart's  rhythm by delivering a controlled, low-energy shock to the heart through your skin.  Different types of ablation, such as catheter ablation, catheter ablation with pacemaker, or surgical ablation. These procedures destroy the heart tissues that send abnormal signals. When the pacemaker is used, it is placed under your skin to help your heart beat in a regular rhythm.  Follow these instructions at home:  Take over-the counter and prescription medicines only as told by your health care provider.  If your health care provider prescribed a  blood-thinning medicine (anticoagulant), take it exactly as told. Taking too much blood-thinning medicine can cause bleeding. If you do not take enough blood-thinning medicine, you will not have the protection that you need against stroke and other problems.  Do not use tobacco products, including cigarettes, chewing tobacco, and e-cigarettes. If you need help quitting, ask your health care provider.  If you have obstructive sleep apnea, manage your condition as told by your health care provider.  Do not drink alcohol.  Do not drink beverages that contain caffeine, such as coffee, soda, and tea.  Maintain a healthy weight. Do not use diet pills unless your health care provider approves. Diet pills may make heart problems worse.  Follow diet instructions as told by your health care provider.  Exercise regularly as told by your health care provider.  Keep all follow-up visits as told by your health care provider. This is important. How is this prevented?  Avoid drinking beverages that contain caffeine or alcohol.  Avoid certain medicines, especially medicines that are used for breathing problems.  Avoid certain herbs and herbal medicines, such as those that contain ephedra or ginseng.  Do not use illegal drugs, such as cocaine and amphetamines.  Do not smoke.  Manage your high blood pressure. Contact a health care provider if:  You notice a change in the rate, rhythm, or strength of your heartbeat.  You are taking an anticoagulant and you notice increased bruising.  You tire more easily when you exercise or exert yourself. Get help right away if:  You have chest pain, abdominal pain, sweating, or weakness.  You feel nauseous.  You notice blood in your vomit, bowel movement, or urine.  You have shortness of breath.  You suddenly have swollen feet and ankles.  You feel dizzy.  You have sudden weakness or numbness of the face, arm, or leg, especially on one side of the  body.  You have trouble speaking, trouble understanding, or both (aphasia).  Your face or your eyelid droops on one side. These symptoms may represent a serious problem that is an emergency. Do not wait to see if the symptoms will go away. Get medical help right away. Call your local emergency services (911 in the [place] Do not drive yourself to the hospital. This information is not intended to replace advice given to you by your health care provider. Make sure you discuss any questions you have with your health care provider. Document Released: 04/21/2005 Document Revised: 08/29/2015 Document Reviewed: 08/16/2014 Elsevier Interactive Patient Education  2018 [employer].   Please check your blood pressure on a regular basis.  If it is consistently greater than 140/90, please make an office appointment.

## 2017-07-22 NOTE — Progress Notes (Signed)
Subjective:    Patient ID: Samuel Moran, male    DOB: Nov 25, 1945, 72 y.o.   MRN: [id number]  HPI  72 year old patient who was seen by podiatry last week and noted to have an irregular pulse.  A rhythm strip confirmed atrial fibrillation with a controlled ventricular response.  Patient has no history of palpitations He generally feels well today.   EKGs in the past have revealed PACs and suggested a left atrial abnormality.  Past Medical History:  Diagnosis Date  . ED (erectile dysfunction)   . GERD 07/01/2007  . Parkinson's disease ([place])   . 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 PROSTATE SPECIFIC ANTIGEN, ELEVATED 06/29/2007  . Rheumatoid arthritis ([place])   . SEIZURE DISORDER 07/01/2007   one epsiode only  . Spinal stenosis of lumbar region 09/22/2014     Social History   Socioeconomic History  . Marital status: Single    Spouse name: Not on file  . Number of children: Not on file  . Years of education: Not on file  . Highest education level: Not on file  Social Needs  . Financial resource strain: Not on file  . Food insecurity - worry: Not on file  . Food insecurity - inability: Not on file  . Transportation needs - medical: Not on file  . Transportation needs - non-medical: Not on file  Occupational History  . Occupation: Chartered certified accountant  Tobacco Use  . Smoking status: Light Tobacco Smoker    Types: Cigars  . Smokeless tobacco: Never Used  . Tobacco comment: 1 cigar a week  Substance and Sexual Activity  . Alcohol use: Yes    Alcohol/week: 2.0 oz    Types: 4 Standard drinks or equivalent per week    Comment: one drink a day (liquor)  . Drug use: No  . Sexual activity: Not on file  Other Topics Concern  . Not on file  Social History Narrative  . Not on file    Past Surgical History:  Procedure Laterality Date  . AMPUTATION     right great toe - tramatic age 7  . DEBRIDEMENT TOE Right 07/2016  . HAND SURGERY  03/2017   removal cysts  . IR FLUORO GUIDE  CV LINE RIGHT  10/16/2016  . IR US GUIDE VASC ACCESS RIGHT  10/16/2016    Family History  Problem Relation Age of Onset  . Arthritis Mother   . Alcoholism Mother   . Arthritis Father        died after fall/trauma and had TBI  . Colon cancer Neg Hx   . Esophageal cancer Neg Hx   . Rectal cancer Neg Hx   . Stomach cancer Neg Hx     No Known Allergies  Current Outpatient Medications on File Prior to Visit  Medication Sig Dispense Refill  . amoxicillin-clavulanate (AUGMENTIN) 875-125 MG tablet Take 1 tablet by mouth 2 (two) times daily. 20 tablet 0  . carbidopa-levodopa (SINEMET IR) 25-100 MG tablet Take 1 tablet by mouth 3 (three) times daily. 270 tablet 1  . doxycycline (VIBRA-TABS) 100 MG tablet Take 1 tablet (100 mg total) by mouth 2 (two) times daily. 20 tablet 0  . ibuprofen (ADVIL,MOTRIN) 800 MG tablet Take 800 mg by mouth as needed.    . leflunomide (ARAVA) 20 MG tablet Take 20 mg by mouth daily.    . Multiple Vitamins-Minerals (CENTRUM SILVER PO) Take by mouth daily.      . mupirocin ointment (BACTROBAN) 2 % Apply 1 application  topically 2 (two) times daily. 30 g 2  . NEUPRO 4 MG/24HR PLACE 1 PATCH ONTO THE SKIN DAILY. 30 patch 5  . Omega-3 Fatty Acids (FISH OIL) 1000 MG CAPS Take by mouth.    Marland Kitchen omeprazole (PRILOSEC) 20 MG capsule Take 20 mg by mouth daily.     Current Facility-Administered Medications on File Prior to Visit  Medication Dose Route Frequency Provider Last Rate Last Dose  . 0.9 %  sodium chloride infusion  500 mL Intravenous Once Armbruster, Carlota Raspberry, MD        Pulse 96   Temp 97.9 F (36.6 C) (Oral)   Wt 218 lb 3.2 oz (99 kg)   SpO2 100%   BMI 30.43 kg/m       Review of Systems  Constitutional: Negative for appetite change, chills, fatigue and fever.  HENT: Negative for congestion, dental problem, ear pain, hearing loss, sore throat, tinnitus, trouble swallowing and voice change.   Eyes: Negative for pain, discharge and visual disturbance.    Respiratory: Negative for cough, chest tightness, wheezing and stridor.   Cardiovascular: Negative for chest pain, palpitations and leg swelling.  Gastrointestinal: Negative for abdominal distention, abdominal pain, blood in stool, constipation, diarrhea, nausea and vomiting.  Genitourinary: Negative for difficulty urinating, discharge, flank pain, genital sores, hematuria and urgency.  Musculoskeletal: Negative for arthralgias, back pain, gait problem, joint swelling, myalgias and neck stiffness.  Skin: Positive for wound. Negative for rash.  Neurological: Positive for tremors. Negative for dizziness, syncope, speech difficulty, weakness, numbness and headaches.  Hematological: Negative for adenopathy. Does not bruise/bleed easily.  Psychiatric/Behavioral: Negative for behavioral problems and dysphoric mood. The patient is not nervous/anxious.        Objective:   Physical Exam  Constitutional: He is oriented to person, place, and time. He appears well-developed.  Blood pressure 134/78   HENT:  Head: Normocephalic.  Right Ear: External ear normal.  Left Ear: External ear normal.  Eyes: Conjunctivae and EOM are normal.  Neck: Normal range of motion.  Cardiovascular: Normal rate and normal heart sounds.  Irregular rhythm with controlled ventricular response  Pulmonary/Chest: Breath sounds normal.  Abdominal: Bowel sounds are normal.  Musculoskeletal: Normal range of motion. He exhibits no edema or tenderness.  Neurological: He is alert and oriented to person, place, and time.  Psychiatric: He has a normal mood and affect. His behavior is normal.          Assessment & Plan:   New onset atrial fibrillation with controlled ventricular response CHADS2 0 CHADSVAS 1 (age)  Rule out sustained hypertension.  Home blood pressure monitoring encouraged Check 2 D Cardiogram  F/u 1 month  [id number],PETER Pilar Plate

## 2017-07-24 ENCOUNTER — Encounter: Payer: Self-pay | Admitting: Podiatry

## 2017-07-24 ENCOUNTER — Encounter: Payer: Medicare HMO | Admitting: Podiatry

## 2017-07-27 ENCOUNTER — Other Ambulatory Visit: Payer: Self-pay

## 2017-07-27 ENCOUNTER — Ambulatory Visit (INDEPENDENT_AMBULATORY_CARE_PROVIDER_SITE_OTHER): Payer: Medicare HMO | Admitting: Podiatry

## 2017-07-27 ENCOUNTER — Ambulatory Visit (HOSPITAL_COMMUNITY): Payer: Medicare HMO | Attending: Cardiovascular Disease

## 2017-07-27 DIAGNOSIS — I481 Persistent atrial fibrillation: Secondary | ICD-10-CM | POA: Diagnosis not present

## 2017-07-27 DIAGNOSIS — I420 Dilated cardiomyopathy: Secondary | ICD-10-CM | POA: Insufficient documentation

## 2017-07-27 DIAGNOSIS — I4819 Other persistent atrial fibrillation: Secondary | ICD-10-CM

## 2017-07-27 DIAGNOSIS — M869 Osteomyelitis, unspecified: Secondary | ICD-10-CM

## 2017-07-27 MED ORDER — CIPROFLOXACIN HCL 750 MG PO TABS: 750.0000 mg | ORAL_TABLET | Freq: Two times a day (BID) | ORAL | 0 refills | Status: DC

## 2017-07-28 ENCOUNTER — Telehealth: Payer: Self-pay | Admitting: *Deleted

## 2017-07-28 NOTE — Telephone Encounter (Signed)
"  I'm Dr. Leigh Aurora patient.  We have been talking about doing surgery.  The days he has talked about, Monday and Wednesday, they don't work for me.  I don't have a ride but the following week, the 8th through the 12th, I would like to schedule surgery then."

## 2017-07-29 NOTE — Telephone Encounter (Signed)
"  I'm trying to find out when Dr. Jacqualyn Posey has scheduled my upcoming surgery.  It can't be next week because the person that's bringing me can't be off those days.  It needs to be during the week of the 8th through the 12th.  The sooner the better so she can take that day off and they don't all get taken.  I called yesterday and left a message so I wanted to try again.

## 2017-07-29 NOTE — Progress Notes (Addendum)
Subjective: Samuel Moran is a 72 y.o. is seen today in office s/p bone biopsy of multiple metatarsals, cuboid and for excision of wound and I&D preformed on 07/18/2017.  His friends to continue to change the bandage on the right foot with a saline wet-to-dry to the wound.  He is continued on ciprofloxacin.  He denies any swelling or redness to his foot he states the swelling is actually improved.  He is remained in the cam boot but he has been walking and this despite me asking him to be nonweightbearing.  He is also driving. Denies any systemic complaints such as fevers, chills, nausea, vomiting. No calf pain, chest pain, shortness of breath.   Objective: General: No acute distress, AAOx3  DP/PT pulses palpable 2/4, CRT < 3 sec to all digits.  Protective sensation intact. Motor function intact.  Right foot: Incision is well coapted without any evidence of dehiscence and sutures are intact to the bone biopsy sites.  Along the plantar submetatarsal 1 area is packed open with a saline wet-to-dry gauze.  Small amount of serosanguineous drainage is expressed upon removal of the dressing however there is no purulence.  There is some swelling to the foot but this is improved compared to what it has been although mildly.  There is no erythema or increased warmth of the foot.  The wound on the submetatarsal one area probes to bone.  There is no fluctuation or crepitation.  No malodor. No other areas of tenderness to bilateral lower extremities.  No other open lesions or pre-ulcerative lesions.  No pain with calf compression, swelling, warmth, erythema.   Wound biopsy-acutely inflamed and necrotic granulation tissue, with small localized fragmentation of benign squamous epithelium.  No malignancy identified. Bone culture first metatarsal-Pseudomonas aeruginosa (sensitive to ciprofloxacin) Wound culture right foot-Pseudomonas aeruginosa (sensitive to ciprofloxacin) Bone culture right fourth metatarsal-no  growth Bone culture cuboid-no growth Bone culture of the fifth metatarsal-no growth  Assessment and Plan:  Status post bone biopsy, wound excision, I&D right foot  -Treatment options discussed including all alternatives, risks, and complications -Wound culture results were discussed the patient.  See below.  I discussed with him in regards to going back to surgery for a first metatarsal resection to remove a portion of the metatarsal to remove the infected bone.  I like to do this this week however he does not want to do it this week due to prior obligations this weekend.  Discussed with him the longer we wait on this then there is a chance of infection spreading and he understands that he will not do surgery this week.  We will plan on doing this next week.  For now continue with saline wet-to-dry dressing changes to the wound daily.  Continue with ciprofloxacin I change the dose to 750 mg twice a day. -Continue to stress nonweightbearing and not to drive. -Will discuss with Rheumatology about holding [place] for any clinical signs or symptoms of infection and directed to call the office immediately should any occur or go to the ER. Discussed is any worsening go to the ER and there is a chance of amputation. -RTC 1 week or sooner if needed.   *x-ray right foot next appointment  Trula Slade DPM

## 2017-07-29 NOTE — Telephone Encounter (Signed)
I am returning your call.  Dr. Jacqualyn Posey said he doesn't think it's a good idea to wait so long to have your surgery.  "Well I don't have a ride.  She can't get off next week but she can the following week.  Plus, my daughter is moving on Thursday. I have to help her.  I definitely can't do Monday and Tuesday of next week."  Well Dr. Jacqualyn Posey wants you to know he doesn't recommend the wait.  "When can he do it?"  He can do it on Wednesday of next week.  "Well let me check with her.  I'll call you back.

## 2017-07-30 ENCOUNTER — Telehealth: Payer: Self-pay | Admitting: *Deleted

## 2017-07-30 ENCOUNTER — Encounter: Payer: Self-pay | Admitting: *Deleted

## 2017-07-30 ENCOUNTER — Telehealth: Payer: Self-pay | Admitting: Podiatry

## 2017-07-30 NOTE — Telephone Encounter (Signed)
Left message for pt to call concerning his arthritis medication.

## 2017-07-30 NOTE — Telephone Encounter (Signed)
"  I spoke to Dr. Leigh Aurora nurse yesterday about scheduling surgery.  They want to go next Wednesday, the 3rd. That is fine with me.  I'd like to do it as soon as early as possible during the day.  My ride has an afternoon commitment that she has to go to.  Surgery for Samuel Moran on Wednesday, the third will be fine."

## 2017-07-30 NOTE — Telephone Encounter (Signed)
Faxed letter to Dr. [name], requesting to hold pt's rheumatoid medications Arava due to infection.

## 2017-07-30 NOTE — Telephone Encounter (Signed)
Left message informing [facility], thanking her for getting back with our office and I would inform Dr. Jacqualyn Posey, that pt had been told to hold the [place], but the pt had not told Dr. Jacqualyn Posey, but I would inform at this time.

## 2017-07-30 NOTE — Telephone Encounter (Signed)
-----   Message from Trula Slade, DPM sent at 07/29/2017  2:41 PM EDT ----- Can you send a note to his rheumatologist (Dr. [name]) asking we can hold his rheumatoid meds due to infection? Thanks.

## 2017-07-30 NOTE — Telephone Encounter (Signed)
My name is Jenny Reichmann and I'm calling from [facility]. This message is for the nurse. I need to get a message to Celesta Gentile. We got a letter from him to Dr. [name] and he is asking for permission to hold [place] for Mr. Mcguirt. I need to let him know that we have already spoken with Mr. Lucarelli and told him to hold the medication after we got his notes yesterday. So if you would please get that message to him and please give me a call back and let me know that you did get my message and let Mr. Jacqualyn Posey know this. My phone number is [phone] and my extension is [phone]. Thank you.

## 2017-08-03 ENCOUNTER — Ambulatory Visit (INDEPENDENT_AMBULATORY_CARE_PROVIDER_SITE_OTHER): Payer: Medicare HMO

## 2017-08-03 ENCOUNTER — Ambulatory Visit: Payer: Medicare HMO | Admitting: Podiatry

## 2017-08-03 ENCOUNTER — Ambulatory Visit: Payer: Medicare HMO

## 2017-08-03 ENCOUNTER — Ambulatory Visit (INDEPENDENT_AMBULATORY_CARE_PROVIDER_SITE_OTHER): Payer: Medicare HMO | Admitting: Podiatry

## 2017-08-03 DIAGNOSIS — L97519 Non-pressure chronic ulcer of other part of right foot with unspecified severity: Secondary | ICD-10-CM

## 2017-08-03 DIAGNOSIS — L02611 Cutaneous abscess of right foot: Secondary | ICD-10-CM | POA: Diagnosis not present

## 2017-08-03 MED ORDER — CEPHALEXIN 500 MG PO CAPS: 500.0000 mg | ORAL_CAPSULE | Freq: Four times a day (QID) | ORAL | 0 refills | Status: DC

## 2017-08-04 ENCOUNTER — Encounter (HOSPITAL_COMMUNITY): Payer: Self-pay | Admitting: Emergency Medicine

## 2017-08-04 ENCOUNTER — Other Ambulatory Visit: Payer: Self-pay

## 2017-08-04 MED ORDER — CEFAZOLIN SODIUM 10 G IJ SOLR
3.0000 g | INTRAMUSCULAR | Status: AC
  Administered 2017-08-05: 3 g via INTRAVENOUS
  Filled 2017-08-04: qty 3

## 2017-08-04 NOTE — Telephone Encounter (Signed)
Patient was scheduled for surgery on 08/05/2017 at [place].  Patient will be admitted after surgery.

## 2017-08-04 NOTE — Progress Notes (Signed)
Spoke with pt for pre-op call. Pt recently diagnosed with A-fib. Pt was not started on medications, states he can't tell that his heart is irregular. Pt denies any chest pain, HTN or diabetes.

## 2017-08-04 NOTE — Progress Notes (Signed)
Anesthesia chart review:  Patient is a same-day workup.  Patient is a 72 year old male scheduled for right first metatarsal head complete ostectomy on 4/least/2019 with Celesta Gentile, D.P.M.  - PCP is Bluford Kaufmann, MD.  Last office visit 07/22/17 for new onset afib.  Chart review documents, pt noted to have an irregular pulse at during podiatry surgery at outside surgery center 07/17/17; rhythm strip documented Afib with controlled ventricular response.  Exam by Dr. Burnice Logan 07/22/17 documents irregular heart rhythm with controlled ventricular response. CHADS2 0, CHADSVAS 1 (age) therefore anticoagulation not initiated. Echo obtained, see below. No rate controlling medications initiated.   PMH includes: atrial fibrillation, Parkinson's disease, RA, seizures (x1 in 2009) GERD.  Current smoker.  BMI 30.5  Medications include: Augmentin, carbidopa-levodopa, Keflex, Cipro, doxycycline, Arava, Neupro, Prilosec  Labs will be obtained day of surgery  EKG will be obtained day of surgery  Echo 07/27/17:  - Left ventricle: The cavity size was normal. Wall thickness was increased in a pattern of moderate LVH. Indeterminant diastolic function (atrial fibrillation). Systolic function was normal. The estimated ejection fraction was in the range of 55% to 60%. Wall motion was normal; there were no regional wall motion abnormalities. - Aortic valve: There was no stenosis. - Mitral valve: There was trivial regurgitation. - Left atrium: The atrium was moderately dilated. - Right ventricle: The cavity size was normal. Systolic function was normal. - Right atrium: The atrium was mildly dilated. - Tricuspid valve: Peak RV-RA gradient (S): 31 mm Hg. - Pulmonary arteries: PA peak pressure: 39 mm Hg (S). - Systemic veins: IVC measured 2.2 with > 50% respirophasic variation, suggesting RA pressure 8 mmHg. - Impressions: The patient was in atrial fibrillation. Normal LV size with moderate LV hypertrophy. EF  55-60%. Normal RV size and systolic function. No significant valvular abnormalities. Mild pulmonary hypertension.  Pt will need further assessment by assigned anesthesiologist day of surgery.  Willeen Cass, FNP-BC [facility]/Anesthesiology Phone: [phone] 08/04/2017 10:54 AM

## 2017-08-04 NOTE — Progress Notes (Signed)
Subjective: Samuel Moran presents the office with his friend for follow-up evaluation of right foot osteomyelitis and infection.  Since I last saw him he is continue with ciprofloxacin 750 mg twice a day.  Also since I saw him he states that he has been walking in the boot.  As he lives alone he has difficulty being able to be non-weightbearing.  Also the last saw him he celebrated a birthday no surprise birthday party but his friend organized for him.  He is also had friends in town he has been going to [employer] and been active with them. It was good to see his friends.  Denies any systemic complaints such as fevers, chills, nausea, vomiting. No acute changes since last appointment, and no other complaints at this time.   Objective: AAO x3, NAD DP/PT pulses palpable bilaterally, CRT less than 3 seconds On the right foot continues to be ulceration along the submetatarsal one area.  Today there appears to be an increased amount of drainage coming from the area and there is purulence identified.  There is also serosanguineous drainage expressed.  The kidneys be probing to bone.  There is a significant increase in swelling to the right lower extremity there is also an increase in the left lower extremity as well.  There is no increase in warmth of the foot there is no fluctuation or crepitation unable to palpate.  The wound is draining unable to drain.  There is no other areas of abscess in the right foot and there is no other areas of cellulitis. No open lesions or pre-ulcerative lesions.  No pain with calf compression, swelling, warmth, erythema  Assessment: Osteomyelitis right first metatarsal  Plan: -All treatment options discussed with the patient including all alternatives, risks, complications.  -Repeat x-rays were obtained and reviewed.  There is evidence of osteomyelitis of the first metatarsal distally. -Given the significant increase in swelling as well as purulence I recommended admission to the  hospital for IV antibiotics and surgical debridement.  However after discussing with him and his friend he cannot go to the hospital until Wednesday.  He has prior engagements.  I did recommend going to the emergency room however he will not go at this point.  Discussed there is any signs or symptoms of worsening infection is to go immediately to the emergency room.  We will plan on doing first metatarsal resection as well as wound excision and debridement on Wednesday at the hospital and will admit him postoperatively.  He will need IV antibiotics as well as infectious disease consultation.  We discussed the transmetatarsal amputation but he declines this.  Discussed risks and benefits and discussed that he is at risk of amputation. -We are holding [place] due to infection.  I believe this is why he has a sudden increase in swelling is going to hold off on this medication.  Will consider going back on it but I am also concerned with infection.  Samuel Moran DPM  -Patient encouraged to call the office with any questions, concerns, change in symptoms.

## 2017-08-05 ENCOUNTER — Encounter (HOSPITAL_COMMUNITY): Payer: Self-pay | Admitting: *Deleted

## 2017-08-05 ENCOUNTER — Inpatient Hospital Stay (HOSPITAL_COMMUNITY)
Admission: RE | Admit: 2017-08-05 | Discharge: 2017-08-10 | DRG: 504 | Disposition: A | Discharge status: Home / Self Care | Payer: Medicare HMO | Source: Ambulatory Visit | Attending: Internal Medicine | Admitting: Internal Medicine

## 2017-08-05 ENCOUNTER — Encounter: Payer: Self-pay | Admitting: Podiatry

## 2017-08-05 ENCOUNTER — Other Ambulatory Visit: Payer: Self-pay

## 2017-08-05 ENCOUNTER — Encounter (HOSPITAL_COMMUNITY)
Admission: RE | Disposition: A | Discharge status: Home / Self Care | Payer: Self-pay | Source: Ambulatory Visit | Attending: Internal Medicine

## 2017-08-05 ENCOUNTER — Ambulatory Visit (HOSPITAL_COMMUNITY): Payer: Medicare HMO | Admitting: Emergency Medicine

## 2017-08-05 ENCOUNTER — Inpatient Hospital Stay (HOSPITAL_COMMUNITY): Payer: Medicare HMO

## 2017-08-05 ENCOUNTER — Ambulatory Visit: Payer: Medicare HMO | Admitting: Internal Medicine

## 2017-08-05 DIAGNOSIS — I4891 Unspecified atrial fibrillation: Secondary | ICD-10-CM | POA: Diagnosis not present

## 2017-08-05 DIAGNOSIS — M868X7 Other osteomyelitis, ankle and foot: Secondary | ICD-10-CM | POA: Diagnosis not present

## 2017-08-05 DIAGNOSIS — R69 Illness, unspecified: Secondary | ICD-10-CM | POA: Diagnosis not present

## 2017-08-05 DIAGNOSIS — G20 Parkinson's disease: Secondary | ICD-10-CM | POA: Diagnosis not present

## 2017-08-05 DIAGNOSIS — M869 Osteomyelitis, unspecified: Principal | ICD-10-CM | POA: Diagnosis present

## 2017-08-05 DIAGNOSIS — Z09 Encounter for follow-up examination after completed treatment for conditions other than malignant neoplasm: Secondary | ICD-10-CM

## 2017-08-05 DIAGNOSIS — Z89421 Acquired absence of other right toe(s): Secondary | ICD-10-CM | POA: Diagnosis not present

## 2017-08-05 DIAGNOSIS — M069 Rheumatoid arthritis, unspecified: Secondary | ICD-10-CM | POA: Diagnosis present

## 2017-08-05 DIAGNOSIS — L97519 Non-pressure chronic ulcer of other part of right foot with unspecified severity: Secondary | ICD-10-CM | POA: Diagnosis not present

## 2017-08-05 DIAGNOSIS — B965 Pseudomonas (aeruginosa) (mallei) (pseudomallei) as the cause of diseases classified elsewhere: Secondary | ICD-10-CM | POA: Diagnosis present

## 2017-08-05 DIAGNOSIS — E785 Hyperlipidemia, unspecified: Secondary | ICD-10-CM | POA: Diagnosis present

## 2017-08-05 DIAGNOSIS — Z89411 Acquired absence of right great toe: Secondary | ICD-10-CM | POA: Diagnosis not present

## 2017-08-05 DIAGNOSIS — D62 Acute posthemorrhagic anemia: Secondary | ICD-10-CM | POA: Diagnosis not present

## 2017-08-05 DIAGNOSIS — M25771 Osteophyte, right ankle: Secondary | ICD-10-CM | POA: Diagnosis not present

## 2017-08-05 DIAGNOSIS — M86679 Other chronic osteomyelitis, unspecified ankle and foot: Secondary | ICD-10-CM | POA: Diagnosis not present

## 2017-08-05 DIAGNOSIS — I480 Paroxysmal atrial fibrillation: Secondary | ICD-10-CM | POA: Diagnosis present

## 2017-08-05 DIAGNOSIS — F17290 Nicotine dependence, other tobacco product, uncomplicated: Secondary | ICD-10-CM | POA: Diagnosis present

## 2017-08-05 DIAGNOSIS — M86171 Other acute osteomyelitis, right ankle and foot: Secondary | ICD-10-CM | POA: Diagnosis not present

## 2017-08-05 DIAGNOSIS — E871 Hypo-osmolality and hyponatremia: Secondary | ICD-10-CM | POA: Diagnosis not present

## 2017-08-05 DIAGNOSIS — M48061 Spinal stenosis, lumbar region without neurogenic claudication: Secondary | ICD-10-CM | POA: Diagnosis present

## 2017-08-05 DIAGNOSIS — Z720 Tobacco use: Secondary | ICD-10-CM | POA: Diagnosis not present

## 2017-08-05 DIAGNOSIS — D649 Anemia, unspecified: Secondary | ICD-10-CM | POA: Diagnosis present

## 2017-08-05 DIAGNOSIS — Z79899 Other long term (current) drug therapy: Secondary | ICD-10-CM | POA: Diagnosis not present

## 2017-08-05 DIAGNOSIS — K219 Gastro-esophageal reflux disease without esophagitis: Secondary | ICD-10-CM | POA: Diagnosis present

## 2017-08-05 HISTORY — DX: Cardiac arrhythmia, unspecified: I49.9

## 2017-08-05 HISTORY — DX: Unspecified atrial fibrillation: I48.91

## 2017-08-05 HISTORY — PX: OSTECTOMY: SHX6439

## 2017-08-05 LAB — BASIC METABOLIC PANEL
Anion gap: 9 (ref 5–15)
BUN: 17 mg/dL (ref 6–20)
CO2: 19 mmol/L — ABNORMAL LOW (ref 22–32)
Calcium: 8.1 mg/dL — ABNORMAL LOW (ref 8.9–10.3)
Chloride: 108 mmol/L (ref 101–111)
Creatinine, Ser: 0.71 mg/dL (ref 0.61–1.24)
GFR calc Af Amer: >60 mL/min (ref >60)
GLUCOSE: 97 mg/dL (ref 65–99)
POTASSIUM: 3.9 mmol/L (ref 3.5–5.1)
Sodium: 136 mmol/L (ref 135–145)

## 2017-08-05 LAB — VITAMIN B12: VITAMIN B 12: 202 pg/mL (ref 180–914)

## 2017-08-05 LAB — RETICULOCYTES
RBC.: 3.68 MIL/uL — ABNORMAL LOW (ref 4.22–5.81)
Retic Count, Absolute: 44.2 10*3/uL (ref 19.0–186.0)
Retic Ct Pct: 1.2 % (ref 0.4–3.1)

## 2017-08-05 LAB — CBC
HCT: 30.5 % — ABNORMAL LOW (ref 39.0–52.0)
Hemoglobin: 9.8 g/dL — ABNORMAL LOW (ref 13.0–17.0)
MCH: 25.5 pg — ABNORMAL LOW (ref 26.0–34.0)
MCHC: 32.1 g/dL (ref 30.0–36.0)
MCV: 79.4 fL (ref 78.0–100.0)
PLATELETS: 335 10*3/uL (ref 150–400)
RBC: 3.84 MIL/uL — AB (ref 4.22–5.81)
RDW: 16.6 % — AB (ref 11.5–15.5)
WBC: 8.5 10*3/uL (ref 4.0–10.5)

## 2017-08-05 LAB — IRON AND TIBC
Iron: 28 ug/dL — ABNORMAL LOW (ref 45–182)
SATURATION RATIOS: 12 % — AB (ref 17.9–39.5)
TIBC: 225 ug/dL — AB (ref 250–450)
UIBC: 197 ug/dL

## 2017-08-05 LAB — PHOSPHORUS: Phosphorus: 2.4 mg/dL — ABNORMAL LOW (ref 2.5–4.6)

## 2017-08-05 LAB — FOLATE: FOLATE: 17.7 ng/mL (ref >5.9)

## 2017-08-05 LAB — MAGNESIUM: Magnesium: 1.9 mg/dL (ref 1.7–2.4)

## 2017-08-05 LAB — FERRITIN: Ferritin: 44 ng/mL (ref 24–336)

## 2017-08-05 SURGERY — OSTECTOMY
Anesthesia: Monitor Anesthesia Care | Site: Foot | Laterality: Right

## 2017-08-05 MED ORDER — PROPOFOL 500 MG/50ML IV EMUL
INTRAVENOUS | Status: DC | PRN
  Administered 2017-08-05: 75 ug/kg/min via INTRAVENOUS

## 2017-08-05 MED ORDER — ONDANSETRON HCL 4 MG/2ML IJ SOLN
INTRAMUSCULAR | Status: DC | PRN
  Administered 2017-08-05: 4 mg via INTRAVENOUS

## 2017-08-05 MED ORDER — CHLORHEXIDINE GLUCONATE CLOTH 2 % EX PADS: 6.0000 | MEDICATED_PAD | Freq: Once | CUTANEOUS | Status: DC

## 2017-08-05 MED ORDER — ROTIGOTINE 4 MG/24HR TD PT24
1.0000 | MEDICATED_PATCH | Freq: Every day | TRANSDERMAL | Status: DC
  Administered 2017-08-07 – 2017-08-10 (×2): 1 via TRANSDERMAL

## 2017-08-05 MED ORDER — OMEGA-3-ACID ETHYL ESTERS 1 G PO CAPS
1.0000 g | ORAL_CAPSULE | Freq: Two times a day (BID) | ORAL | Status: DC
  Administered 2017-08-05 – 2017-08-10 (×9): 1 g via ORAL
  Filled 2017-08-05 (×10): qty 1

## 2017-08-05 MED ORDER — MORPHINE SULFATE (PF) 2 MG/ML IV SOLN: 1.0000 mg | INTRAVENOUS | Status: DC | PRN

## 2017-08-05 MED ORDER — NAPROXEN 250 MG PO TABS
500.0000 mg | ORAL_TABLET | Freq: Two times a day (BID) | ORAL | Status: DC
  Administered 2017-08-05 – 2017-08-10 (×8): 500 mg via ORAL
  Filled 2017-08-05 (×3): qty 1
  Filled 2017-08-05 (×2): qty 2
  Filled 2017-08-05: qty 1
  Filled 2017-08-05: qty 2
  Filled 2017-08-05: qty 1
  Filled 2017-08-05: qty 2
  Filled 2017-08-05 (×4): qty 1

## 2017-08-05 MED ORDER — MIDAZOLAM HCL 2 MG/2ML IJ SOLN
INTRAMUSCULAR | Status: AC
  Filled 2017-08-05: qty 2

## 2017-08-05 MED ORDER — ONDANSETRON HCL 4 MG/2ML IJ SOLN
INTRAMUSCULAR | Status: AC
  Filled 2017-08-05: qty 2

## 2017-08-05 MED ORDER — LIDOCAINE HCL (CARDIAC) 20 MG/ML IV SOLN
INTRAVENOUS | Status: AC
  Filled 2017-08-05: qty 5

## 2017-08-05 MED ORDER — SODIUM CHLORIDE 0.9 % IR SOLN
Status: DC | PRN
  Administered 2017-08-05: 3000 mL

## 2017-08-05 MED ORDER — FENTANYL CITRATE (PF) 250 MCG/5ML IJ SOLN
INTRAMUSCULAR | Status: DC | PRN
  Administered 2017-08-05: 50 ug via INTRAVENOUS
  Administered 2017-08-05: 25 ug via INTRAVENOUS
  Administered 2017-08-05: 50 ug via INTRAVENOUS
  Administered 2017-08-05: 25 ug via INTRAVENOUS

## 2017-08-05 MED ORDER — LIDOCAINE HCL 1 % IJ SOLN
INTRAMUSCULAR | Status: DC | PRN
  Administered 2017-08-05: 25 mL

## 2017-08-05 MED ORDER — PHENYLEPHRINE HCL 10 MG/ML IJ SOLN
INTRAMUSCULAR | Status: DC | PRN
  Administered 2017-08-05: 40 ug via INTRAVENOUS
  Administered 2017-08-05: 80 ug via INTRAVENOUS

## 2017-08-05 MED ORDER — FENTANYL CITRATE (PF) 250 MCG/5ML IJ SOLN
INTRAMUSCULAR | Status: AC
  Filled 2017-08-05: qty 5

## 2017-08-05 MED ORDER — ACETAMINOPHEN 650 MG RE SUPP: 650.0000 mg | Freq: Four times a day (QID) | RECTAL | Status: DC | PRN

## 2017-08-05 MED ORDER — ONDANSETRON HCL 4 MG PO TABS: 4.0000 mg | ORAL_TABLET | Freq: Four times a day (QID) | ORAL | Status: DC | PRN

## 2017-08-05 MED ORDER — PROPOFOL 10 MG/ML IV BOLUS
INTRAVENOUS | Status: AC
  Filled 2017-08-05: qty 20

## 2017-08-05 MED ORDER — PANTOPRAZOLE SODIUM 40 MG PO TBEC
40.0000 mg | DELAYED_RELEASE_TABLET | Freq: Every day | ORAL | Status: DC
  Administered 2017-08-05 – 2017-08-10 (×5): 40 mg via ORAL
  Filled 2017-08-05 (×6): qty 1

## 2017-08-05 MED ORDER — LIDOCAINE HCL 1 % IJ SOLN
INTRAMUSCULAR | Status: AC
  Filled 2017-08-05: qty 40

## 2017-08-05 MED ORDER — ACETAMINOPHEN 325 MG PO TABS: 650.0000 mg | ORAL_TABLET | Freq: Four times a day (QID) | ORAL | Status: DC | PRN

## 2017-08-05 MED ORDER — LACTATED RINGERS IV SOLN
INTRAVENOUS | Status: DC
  Administered 2017-08-05: 50 mL/h via INTRAVENOUS

## 2017-08-05 MED ORDER — LACTATED RINGERS IV SOLN
INTRAVENOUS | Status: DC | PRN
  Administered 2017-08-05: 11:00:00 via INTRAVENOUS

## 2017-08-05 MED ORDER — ONDANSETRON HCL 4 MG/2ML IJ SOLN: 4.0000 mg | Freq: Four times a day (QID) | INTRAMUSCULAR | Status: DC | PRN

## 2017-08-05 MED ORDER — BUPIVACAINE HCL (PF) 0.5 % IJ SOLN
INTRAMUSCULAR | Status: DC | PRN
  Administered 2017-08-05: 25 mL

## 2017-08-05 MED ORDER — PROPOFOL 10 MG/ML IV BOLUS
INTRAVENOUS | Status: DC | PRN
  Administered 2017-08-05 (×2): 20 mg via INTRAVENOUS

## 2017-08-05 MED ORDER — BUPIVACAINE HCL (PF) 0.5 % IJ SOLN
INTRAMUSCULAR | Status: AC
  Filled 2017-08-05: qty 30

## 2017-08-05 MED ORDER — CIPROFLOXACIN IN D5W 400 MG/200ML IV SOLN
400.0000 mg | Freq: Two times a day (BID) | INTRAVENOUS | Status: DC
  Administered 2017-08-05 – 2017-08-07 (×4): 400 mg via INTRAVENOUS
  Filled 2017-08-05 (×5): qty 200

## 2017-08-05 MED ORDER — OXYCODONE HCL 5 MG PO TABS
5.0000 mg | ORAL_TABLET | ORAL | Status: DC | PRN
  Administered 2017-08-05: 5 mg via ORAL
  Filled 2017-08-05: qty 1

## 2017-08-05 MED ORDER — CARBIDOPA-LEVODOPA 25-100 MG PO TABS
1.0000 | ORAL_TABLET | Freq: Three times a day (TID) | ORAL | Status: DC
  Administered 2017-08-05 – 2017-08-10 (×15): 1 via ORAL
  Filled 2017-08-05 (×16): qty 1

## 2017-08-05 MED ORDER — MIDAZOLAM HCL 5 MG/5ML IJ SOLN
INTRAMUSCULAR | Status: DC | PRN
  Administered 2017-08-05: 2 mg via INTRAVENOUS

## 2017-08-05 MED ORDER — LIDOCAINE HCL (CARDIAC) 20 MG/ML IV SOLN
INTRAVENOUS | Status: DC | PRN
  Administered 2017-08-05: 50 mg via INTRAVENOUS

## 2017-08-05 SURGICAL SUPPLY — 44 items
BANDAGE ESMARK 6X9 LF (GAUZE/BANDAGES/DRESSINGS) IMPLANT
BLADE SAW SGTL 81X20 HD (BLADE) ×1 IMPLANT
BLADE SURG 10 STRL SS (BLADE) ×3 IMPLANT
BNDG CMPR 9X6 STRL LF SNTH (GAUZE/BANDAGES/DRESSINGS)
BNDG COHESIVE 4X5 TAN STRL (GAUZE/BANDAGES/DRESSINGS) ×2 IMPLANT
BNDG ESMARK 6X9 LF (GAUZE/BANDAGES/DRESSINGS)
CUFF TOURNIQUET SINGLE 34IN LL (TOURNIQUET CUFF) ×1 IMPLANT
CUFF TOURNIQUET SINGLE 44IN (TOURNIQUET CUFF) IMPLANT
DRAPE U-SHAPE 47X51 STRL (DRAPES) ×4 IMPLANT
DURAPREP 26ML APPLICATOR (WOUND CARE) ×2 IMPLANT
ELECT REM PT RETURN 9FT ADLT (ELECTROSURGICAL) ×2
ELECTRODE REM PT RTRN 9FT ADLT (ELECTROSURGICAL) ×1 IMPLANT
GAUZE IODOFORM PACK 1/2 7832 (GAUZE/BANDAGES/DRESSINGS) ×1 IMPLANT
GAUZE SPONGE 4X4 12PLY STRL (GAUZE/BANDAGES/DRESSINGS) ×2 IMPLANT
GAUZE XEROFORM 5X9 LF (GAUZE/BANDAGES/DRESSINGS) ×2 IMPLANT
GLOVE BIO SURGEON STRL SZ8 (GLOVE) ×2 IMPLANT
GLOVE BIOGEL PI IND STRL 8 (GLOVE) ×1 IMPLANT
GLOVE BIOGEL PI INDICATOR 8 (GLOVE) ×1
GLOVE ORTHO TXT STRL SZ7.5 (GLOVE) ×2 IMPLANT
GOWN STRL REUS W/ TWL LRG LVL3 (GOWN DISPOSABLE) ×1 IMPLANT
GOWN STRL REUS W/ TWL XL LVL3 (GOWN DISPOSABLE) ×4 IMPLANT
GOWN STRL REUS W/TWL LRG LVL3 (GOWN DISPOSABLE) ×2
GOWN STRL REUS W/TWL XL LVL3 (GOWN DISPOSABLE) ×2
HANDPIECE INTERPULSE COAX TIP (DISPOSABLE) ×2
KIT BASIN OR (CUSTOM PROCEDURE TRAY) ×2 IMPLANT
KIT TURNOVER KIT B (KITS) ×2 IMPLANT
NS IRRIG 1000ML POUR BTL (IV SOLUTION) ×2 IMPLANT
PACK ORTHO EXTREMITY (CUSTOM PROCEDURE TRAY) ×2 IMPLANT
PAD ARMBOARD 7.5X6 YLW CONV (MISCELLANEOUS) ×3 IMPLANT
PAD CAST 4YDX4 CTTN HI CHSV (CAST SUPPLIES) ×1 IMPLANT
PADDING CAST COTTON 4X4 STRL (CAST SUPPLIES) ×2
SET HNDPC FAN SPRY TIP SCT (DISPOSABLE) IMPLANT
SPONGE LAP 18X18 X RAY DECT (DISPOSABLE) ×4 IMPLANT
STAPLER VISISTAT 35W (STAPLE) ×1 IMPLANT
STOCKINETTE IMPERVIOUS LG (DRAPES) IMPLANT
SUT ETHILON 2 0 PSLX (SUTURE) ×2 IMPLANT
SUT MNCRL AB 3-0 PS2 27 (SUTURE) ×1 IMPLANT
SUT PROLENE 2 0 SH 30 (SUTURE) ×2 IMPLANT
SUT PROLENE 3 0 PS 1 (SUTURE) ×1 IMPLANT
TOWEL OR 17X24 6PK STRL BLUE (TOWEL DISPOSABLE) ×1 IMPLANT
TOWEL OR 17X26 10 PK STRL BLUE (TOWEL DISPOSABLE) ×2 IMPLANT
TUBE CONNECTING 12X1/4 (SUCTIONS) ×2 IMPLANT
WATER STERILE IRR 1000ML POUR (IV SOLUTION) ×1 IMPLANT
YANKAUER SUCT BULB TIP NO VENT (SUCTIONS) ×2 IMPLANT

## 2017-08-05 NOTE — Anesthesia Postprocedure Evaluation (Signed)
Anesthesia Post Note  Patient: Samuel Moran  Procedure(s) Performed: OSTECTOMY COMPLETE METARSAL HEAD FIRST RIGHT (Right Foot)     Patient location during evaluation: PACU Anesthesia Type: MAC Level of consciousness: awake and alert and oriented Pain management: pain level controlled Vital Signs Assessment: post-procedure vital signs reviewed and stable Respiratory status: spontaneous breathing, nonlabored ventilation and respiratory function stable Cardiovascular status: stable and blood pressure returned to baseline Postop Assessment: no apparent nausea or vomiting Anesthetic complications: no    Last Vitals:  Vitals:   08/05/17 1345 08/05/17 1400  BP: (!) 153/81 (!) 156/80  Pulse: 68   Resp: 17   Temp:    SpO2: 92% 93%    Last Pain:  Vitals:   08/05/17 1300  TempSrc:   PainSc: Asleep        RLE Motor Response: Purposeful movement;Responds to commands (08/05/17 1400) RLE Sensation: Full sensation (08/05/17 1400)      Emnet Monk A.

## 2017-08-05 NOTE — Transfer of Care (Signed)
Immediate Anesthesia Transfer of Care Note  Patient: Samuel Moran  Procedure(s) Performed: OSTECTOMY COMPLETE METARSAL HEAD FIRST RIGHT (Right Foot)  Patient Location: PACU  Anesthesia Type:MAC  Level of Consciousness: awake, alert  and oriented  Airway & Oxygen Therapy: Patient Spontanous Breathing and Patient connected to nasal cannula oxygen  Post-op Assessment: Report given to RN and Post -op Vital signs reviewed and stable  Post vital signs: Reviewed and stable  Last Vitals:  Vitals Value Taken Time  BP    Temp    Pulse 72 08/05/2017 12:31 PM  Resp    SpO2 94 % 08/05/2017 12:31 PM  Vitals shown include unvalidated device data.  Last Pain:  Vitals:   08/05/17 0800  TempSrc: Oral      Patients Stated Pain Goal: 3 (65/03/54 6568)  Complications: No apparent anesthesia complications

## 2017-08-05 NOTE — Brief Op Note (Signed)
08/05/2017  12:19 PM  PATIENT:  Samuel Moran  72 y.o. male  PRE-OPERATIVE DIAGNOSIS:  OSTROMYELITIS  POST-OPERATIVE DIAGNOSIS:  OSTROMYELITIS  PROCEDURE:  Procedure(s): OSTECTOMY COMPLETE METARSAL HEAD FIRST RIGHT (Right)  SURGEON:  Surgeon(s) and Role:    * Trula Slade, DPM - Primary  PHYSICIAN ASSISTANT:   ASSISTANTS: none   ANESTHESIA:   MAC  EBL:  15 mL   BLOOD ADMINISTERED:none  DRAINS: none   LOCAL MEDICATIONS USED:  OTHER 2% lidocaine and 0.5% marcaine plain  SPECIMEN:  Source of Specimen:  wound culture right foot, 1st metatarsal clean margin right foot for micro and path  DISPOSITION OF SPECIMEN:  PATHOLOGY  COUNTS:  YES  TOURNIQUET:  * Missing tourniquet times found for documented tourniquets in log: [id number] *  DICTATION: .Dragon Dictation  PLAN OF CARE: Admit to inpatient   PATIENT DISPOSITION:  PACU - hemodynamically stable.   Delay start of Pharmacological VTE agent (>24hrs) due to surgical blood loss or risk of bleeding: no  Findings: There was purulence during the debridement. Able to resect the 1st metatarsal to what I feel is healthy. Remaining bone appears to be hard and white. Sent clean margins to pathology and microbiology. Admit to inpatient for IV antibiotics. Will need infectious disease consult. NWB postop.   Celesta Gentile, DPM

## 2017-08-05 NOTE — Progress Notes (Signed)
Orthopedic Tech Progress Note Patient Details:  Samuel Moran [birth year] [id number]  Ortho Devices Type of Ortho Device: Darco shoe Ortho Device/Splint Location: rle Ortho Device/Splint Interventions: Application   Post Interventions Patient Tolerated: Well Instructions Provided: Care of device   Hildred Priest 08/05/2017, 2:57 PM

## 2017-08-05 NOTE — Anesthesia Procedure Notes (Signed)
Procedure Name: MAC Date/Time: 08/05/2017 11:08 AM Performed by: Teressa Lower., CRNA Pre-anesthesia Checklist: Patient identified, Emergency Drugs available, Suction available, Patient being monitored and Timeout performed Patient Re-evaluated:Patient Re-evaluated prior to induction Oxygen Delivery Method: Simple face mask Ventilation: Oral airway inserted - appropriate to patient size

## 2017-08-05 NOTE — H&P (Signed)
Subjective: [name] presents to the hospital today for surgical intervention of osteomyelitis of the right foot. He had an MRI previously which showed an abscess submetatarsal 1 as well as osteomyelitis of the first metatarsal as well as the fourth and fifth metatarsal and cuboid.  He recently underwent a bone biopsy and it did confirm that there was osteomyelitis of the first metatarsal, growing Pseudomonas as well as a wound culture that was growing Pseudomonas.  Because of this I discussed with him surgical intervention for partial first ray resection as well as wound excision.  We had a discussion regards to the other areas on MRI that did not show osteomyelitis although the bone cultures were no growth.  He was scheduled for outpatient surgery however when I saw him on Monday there is significant increase in swelling as well as purulence coming to the submetatarsal one area.  Because of this the surgery was admitted to the hospital plan for same-day admission for IV antibiotics and likely infectious disease consultation. Denies any systemic complaints such as fevers, chills, nausea, vomiting. No acute changes since last appointment, and no other complaints at this time.   Objective: AAO x3, NAD DP/PT pulses palpable bilaterally, CRT less than 3 seconds.   Swelling present bilateral lower extremities the right side are severe.  The swelling seemed to start after he stopped his rheumatoid arthritis medication.  Ulceration continues right foot submetatarsal 1 with draining. No open lesions or pre-ulcerative lesions.  No pain with calf compression, swelling, warmth, erythema  Assessment: Osteomyelitis/abscess right foot  Plan: -All treatment options discussed with the patient including all alternatives, risks, complications.  -We will plan for first metatarsal partial resection with wound excision.Surgical consent was reviewed and signed. All alternatives, risks, complications were discussed.  No  promises or guarantees were given the fact of the procedure. -NPO since midnight -Will plan for postop admission for IV antibiotics, ID consultation. He will be NWB postop.  thank you -Patient encouraged to call the office with any questions, concerns, change in symptoms.   Celesta Gentile, DPM O: [phone] C: [phone]

## 2017-08-05 NOTE — H&P (Signed)
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 male with medical history significant for Parkinson's disease on medication, rheumatoid arthritis on immunosuppressant medications, dyslipidemia, and new diagnosis paroxysmal atrial fibrillation with CHA2DS2-VASc of 1.  Patient was taken to the OR today by his podiatrist Dr. 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  He has subsequently undergone aggressive resection of the first metatarsal with associated abscess and flap creation.  We have been asked to admit the patient, administer IV antibiotics, and treat his underlying chronic medical problems.  Patient was examined in PACU immediately post surgery and was too sedated to participate in history portion of exam.   Review of Systems:  **History obtained from Dr. 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 **Arava recently discontinued 1 week ago by Dr. Jacqualyn Posey after consulting with patient's rheumatologist Dr. Mina Marble that time patient has had increased puffiness without redness to the bilateral lower extremities   Past Medical History:  Diagnosis Date  . Atrial fibrillation ([place])    onset 07/17/17  . Dysrhythmia   . ED (erectile dysfunction)   . GERD 07/01/2007  . Parkinson's disease ([place])   . 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 PROSTATE SPECIFIC ANTIGEN, ELEVATED 06/29/2007  . Rheumatoid arthritis ([place])   . SEIZURE DISORDER 07/01/2007   one epsiode only  . Spinal stenosis of lumbar region 09/22/2014    Past Surgical History:  Procedure Laterality Date  . AMPUTATION     right great toe - tramatic age 70  . COLONOSCOPY    . DEBRIDEMENT TOE Right 07/2016  . HAND SURGERY  03/2017   removal cysts  . IR FLUORO GUIDE CV LINE RIGHT  10/16/2016  . IR US GUIDE VASC ACCESS RIGHT  10/16/2016    Social History   Socioeconomic History  . Marital status: Single    Spouse name: Not on file  . Number of children: Not on file  . Years of education: Not on file  . Highest education level: Not on file  Occupational History  . Occupation: Chartered certified accountant  Social Needs  . Financial resource strain: Not on file  . Food insecurity:    Worry: Not on file    Inability: Not on file  . Transportation needs:    Medical: Not on file    Non-medical: Not on file  Tobacco Use  . Smoking status: Light Tobacco Smoker    Types: Cigars  . Smokeless tobacco: Never Used  . Tobacco comment: 1 cigar a week  Substance and Sexual Activity  . Alcohol use: Yes    Alcohol/week: 2.0 oz    Types: 4 Standard drinks or equivalent per week    Comment: one drink a day (liquor)  . Drug use: No  . Sexual activity: Not on file  Lifestyle  . Physical activity:    Days per week: Not on file    Minutes per session: Not on file  .  Stress: Not on file  Relationships  . Social connections:    Talks on phone: Not on file    Gets together: Not on file    Attends religious service: Not on file    Active member of club or organization: Not on file    Attends meetings of clubs or organizations: Not on file    Relationship status: Not on file  . Intimate partner violence:    Fear of current or ex partner: Not on file    Emotionally abused: Not on file    Physically abused: Not on file    Forced sexual activity: Not on file  Other  Topics Concern  . Not on file  Social History Narrative  . Not on file    Mobility: Independent Work history: Not obtained   No Known Allergies  Family History  Problem Relation Age of Onset  . Arthritis Mother   . Alcoholism Mother   . Arthritis Father        died after fall/trauma and had TBI  . Colon cancer Neg Hx   . Esophageal cancer Neg Hx   . Rectal cancer Neg Hx   . Stomach cancer Neg Hx      Prior to Admission medications   Medication Sig Start Date End Date Taking? Authorizing Provider  carbidopa-levodopa (SINEMET IR) 25-100 MG tablet Take 1 tablet by mouth 3 (three) times daily. 04/10/17  Yes Tat, Eustace Quail, DO  cephALEXin (KEFLEX) 500 MG capsule Take 1 capsule (500 mg total) by mouth 4 (four) times daily. 08/03/17  Yes Trula Slade, DPM  ciprofloxacin (CIPRO) 750 MG tablet Take 1 tablet (750 mg total) by mouth 2 (two) times daily. 07/27/17  Yes Trula Slade, DPM  doxycycline (VIBRA-TABS) 100 MG tablet Take 1 tablet (100 mg total) by mouth 2 (two) times daily. 07/20/17  Yes Trula Slade, DPM  ibuprofen (ADVIL,MOTRIN) 800 MG tablet Take 800 mg by mouth every 8 (eight) hours as needed for mild pain or moderate pain.    Yes [provider]  Multiple Vitamins-Minerals (CENTRUM SILVER PO) Take 1 tablet by mouth daily.    Yes [provider]  naproxen (NAPROSYN) 500 MG tablet Take 500 mg by mouth 2 (two) times daily with a meal.   Yes [provider]  NEUPRO 4 MG/24HR PLACE 1 PATCH ONTO THE SKIN DAILY. 06/15/17  Yes Tat, Eustace Quail, DO  Omega-3 Fatty Acids (FISH OIL) 1000 MG CAPS Take 1 capsule by mouth daily.    Yes [provider]  omeprazole (PRILOSEC) 20 MG capsule Take 20 mg by mouth daily.   Yes [provider]  amoxicillin-clavulanate (AUGMENTIN) 875-125 MG tablet Take 1 tablet by mouth 2 (two) times daily. Patient not taking: Reported on 08/04/2017 06/17/17   Trula Slade, DPM    Physical Exam: Vitals:     08/05/17 0800 08/05/17 1230 08/05/17 1245  BP: (!) 152/85 127/75 (!) 144/85  Pulse: 86 72 71  Resp: 20 20 18   Temp: 98.5 F (36.9 C) (!) 97.5 F (36.4 C)   TempSrc: Oral    SpO2: 97% 97% 94%  Weight: 97.5 kg (215 lb)    Height: 5\' 11"  (1.803 m)        Constitutional: Dated and appears comfortable Eyes: PERRL, lids and conjunctivae normal ENMT: Mucous membranes are moist. Posterior pharynx clear of any exudate or lesions.Normal dentition.  Neck: normal, supple, no masses, no thyromegaly Respiratory: clear to auscultation bilaterally, no wheezing, no  crackles. Normal respiratory effort. No accessory muscle use.  Cardiovascular: Regular rate and rhythm, no murmurs / rubs / gallops. No extremity edema. 2+ pedal pulses. No carotid bruits.  Abdomen: no tenderness, no masses palpated. No hepatosplenomegaly. Bowel sounds positive.  Musculoskeletal: no clubbing / cyanosis. No joint deformity upper and lower extremities. Good ROM, no contractures. Normal muscle tone.  Right lower extremity from just below knee to foot obscured by Ace wrap dressing placed in the OR. Skin: no rashes, lesions, ulcers. No induration Neurologic: CN 2-12 appear to be grossly intact based on visual inspection. Sensation intact, DTRs and strength not tested given patient's inability to participate secondary to postoperative sedation Psychiatric: Sedated postoperatively-examined in PACU   Labs on Admission: I have personally reviewed following labs and imaging studies  CBC: Recent Labs  Lab 08/05/17 0803  WBC 8.5  HGB 9.8*  HCT 30.5*  MCV 79.4  PLT 474   Basic Metabolic Panel: Recent Labs  Lab 08/05/17 0803  NA 136  K 3.9  CL 108  CO2 19*  GLUCOSE 97  BUN 17  CREATININE 0.71  CALCIUM 8.1*   GFR: Estimated Creatinine Clearance: 102.3 mL/min (by C-G formula based on SCr of 0.71 mg/dL). Liver Function Tests: No results for input(s): AST, ALT, ALKPHOS, BILITOT, PROT, ALBUMIN in the last 168  hours. No results for input(s): LIPASE, AMYLASE in the last 168 hours. No results for input(s): AMMONIA in the last 168 hours. Coagulation Profile: No results for input(s): INR, PROTIME in the last 168 hours. Cardiac Enzymes: No results for input(s): CKTOTAL, CKMB, CKMBINDEX, TROPONINI in the last 168 hours. BNP (last 3 results) No results for input(s): PROBNP in the last 8760 hours. HbA1C: No results for input(s): HGBA1C in the last 72 hours. CBG: No results for input(s): GLUCAP in the last 168 hours. Lipid Profile: No results for input(s): CHOL, HDL, LDLCALC, TRIG, CHOLHDL, LDLDIRECT in the last 72 hours. Thyroid Function Tests: No results for input(s): TSH, T4TOTAL, FREET4, T3FREE, THYROIDAB in the last 72 hours. Anemia Panel: No results for input(s): VITAMINB12, FOLATE, FERRITIN, TIBC, IRON, RETICCTPCT in the last 72 hours. Urine analysis:    Component Value Date/Time   COLORURINE LT. YELLOW 07/26/2010 0855   APPEARANCEUR CLEAR 07/26/2010 0855   LABSPEC 1.025 07/26/2010 0855   PHURINE 5.5 07/26/2010 0855   GLUCOSEU NEGATIVE 07/26/2010 0855   HGBUR NEGATIVE 07/26/2010 0855   HGBUR negative 06/28/2008 0853   BILIRUBINUR n 10/17/2014 [place] 07/26/2010 0855   PROTEINUR n 10/17/2014 0954   UROBILINOGEN 0.2 10/17/2014 0954   UROBILINOGEN 0.2 07/26/2010 0855   NITRITE n 10/17/2014 0954   NITRITE POSITIVE 07/26/2010 0855   LEUKOCYTESUR Negative 10/17/2014 0954   Sepsis Labs: @LABRCNTIP (procalcitonin:4,lacticidven:4) )No results found for this or any previous visit (from the past 240 hour(s)).   Radiological Exams on Admission: Dg Foot Complete Right  Result Date: 08/03/2017 Please see detailed radiograph report in office note.   EKG: (Independently reviewed) sinus rhythm with PACs, ventricular rate 88 bpm, QTC 430 ms, normal R wave rotation, no acute ischemic changes  Assessment/Plan Principal Problem:   Osteomyelitis of toe of right foot  /pansensitive Pseudomonas -S/P resection first metatarsal with debridement and flap creation today by podiatrist -IV Cipro every 12 hours -Recent outpatient cultures demonstrate pansensitive Pseudomonas -Wound care at discretion of podiatrist -Bedrest until weightbearing okayed by podiatrist -IV morphine prn for severe pain -P.o. Oxycodone prn for moderate pain  Active Problems:   Rheumatoid arthritis, adult  -Off  [place] for at least 7 days -Discussed with podiatrist and he wishes to examine wound on 4/4 before making determination about when to resume Arava -Continue scheduled Naprosyn PPI    Paroxysmal atrial fibrillation  -Documented by PCP at outpatient visit as new onset on 07/22/17 -Currently maintaining sinus rhythm with frequent PACs -CHA2DS2-VASc=1    Normocytic anemia -Hgb October 2018 was 12.5 -Current hemoglobin 9.8 -Anemia panel    Parkinson disease  -Continue preadmission Sinemet and Neupro    HLD (hyperlipidemia) -Continue omega-3 fatty acids    **Additional lab, imaging and/or diagnostic evaluation at discretion of supervising physician  DVT prophylaxis: SCDs the immediate postoperative period Code Status: Full Family Communication: No family in PACU Disposition Plan: Home Consults called: Podiatry/[place]    ELLIS,ALLISON L. ANP-BC [facility] Pager [phone]   If 7PM-7AM, please contact night-coverage [email or web address] Password [email or web address]  08/05/2017, 1:03 PM

## 2017-08-05 NOTE — Anesthesia Preprocedure Evaluation (Addendum)
Anesthesia Evaluation  Patient identified by MRN, date of birth, ID band Patient awake    Reviewed: Allergy & Precautions, NPO status , Patient's Chart, lab work & pertinent test results  Airway Mallampati: II  TM Distance: >3 FB Neck ROM: Full    Dental  (+) Teeth Intact   Pulmonary Current Smoker,    Pulmonary exam normal breath sounds clear to auscultation       Cardiovascular + dysrhythmias Atrial Fibrillation  Rhythm:Regular Rate:Normal  EKG 08/05/2017- NSR poor R wave progression   Neuro/Psych Seizures -, Well Controlled,  Last Sz 2009 Hx/o Parkinson's disease negative psych ROS   GI/Hepatic Neg liver ROS, GERD  Medicated and Controlled,  Endo/Other  Obesity  Renal/GU negative Renal ROS   Elevated PSA  ED    Musculoskeletal  (+) Arthritis , Rheumatoid disorders,  Osteomyelitis right first metatarsal head   Abdominal (+) + obese,   Peds  Hematology  (+) anemia ,   Anesthesia Other Findings   Reproductive/Obstetrics                           Anesthesia Physical Anesthesia Plan  ASA: II  Anesthesia Plan: MAC   Post-op Pain Management:    Induction: Intravenous  PONV Risk Score and Plan: 1 and Propofol infusion, Ondansetron and Treatment may vary due to age or medical condition  Airway Management Planned: Natural Airway and Simple Face Mask  Additional Equipment:   Intra-op Plan:   Post-operative Plan:   Informed Consent: I have reviewed the patients History and Physical, chart, labs and discussed the procedure including the risks, benefits and alternatives for the proposed anesthesia with the patient or authorized representative who has indicated his/her understanding and acceptance.   Dental advisory given  Plan Discussed with: CRNA, Anesthesiologist and Surgeon  Anesthesia Plan Comments:         Anesthesia Quick Evaluation

## 2017-08-05 NOTE — H&P (Signed)
Anesthesia H&P Update: History and Physical Exam reviewed; patient is OK for planned anesthetic and procedure. ? ?

## 2017-08-05 NOTE — Progress Notes (Signed)
Subjective: POD #0- stopped by to discuss findings of the surgery and see how he was feeling. He is doing well without any complaints and pain is controlled. He is resting well with the foot elevated.  Denies any complaints such as fevers, chills, nausea, vomiting. No acute changes since last appointment, and no other complaints at this time.   Objective: AAO x3, NAD Bandage clean, dry, intact to the right foot. There is no bleeding or strike through. Pain is controlled. ACE bandage to the knee to help with swelling.  No pain with calf compression, warmth, erythema  Assessment: POD #0 s/p right partial 1st metatarsal resection, wound excision  Plan: -All treatment options discussed with the patient including all alternatives, risks, complications.  -He is resting well and pain is controlled. Continue with elevation.  -Morphine prn pain  -Currently on ciprofloxacin for antibiotics- recommend infectious disease consult -NWB- has Darco wedge shoe but would prefer complete NWB -Will plan on changing bandage Thursday evening. If it is doing well will restart RA medications.  -Plan for possible discharge on Friday.   [place] and [place]: [phone] C: [phone]

## 2017-08-05 NOTE — Op Note (Signed)
PATIENT:  Samuel Moran  72 y.o. male  PRE-OPERATIVE DIAGNOSIS:  OSTROMYELITIS  POST-OPERATIVE DIAGNOSIS:  OSTROMYELITIS  PROCEDURE:  Procedure(s): OSTECTOMY COMPLETE METARSAL HEAD FIRST RIGHT (Right)  SURGEON:  Surgeon(s) and Role:    * Trula Slade, DPM - Primary  PHYSICIAN ASSISTANT:   ASSISTANTS: none   ANESTHESIA:   MAC  EBL:  15 mL   BLOOD ADMINISTERED:none  DRAINS: none   LOCAL MEDICATIONS USED:  OTHER 2% lidocaine and 0.5% marcaine plain  SPECIMEN:  Source of Specimen:  wound culture right foot, 1st metatarsal clean margin right foot for micro and path  DISPOSITION OF SPECIMEN:  PATHOLOGY  COUNTS:  YES  TOURNIQUET:  * Missing tourniquet times found for documented tourniquets in log: [id number] *  DICTATION: .Dragon Dictation  PLAN OF CARE: Admit to inpatient   PATIENT DISPOSITION:  PACU - hemodynamically stable.   Delay start of Pharmacological VTE agent (>24hrs) due to surgical blood loss or risk of bleeding: no  Indications for surgery: Samuel Moran has been under my care for quite some time now.  He previously last year had a wound submetatarsal 3 and 4 area resulted in a metatarsal head resection.  He did well afterwards and healed uneventfully.  Over the last month or more he started to develop an ulceration along the plantar flap from the area of the hallux  previously.  He has had some constant swelling to this area since the initial amputation.  I was initially able to drain just clear to yellow fluid from the area and was cultured previously and not had any infection.  The wound continued and as I was aspirated and he started to have some pus coming from the area.  I recommend an MRI as well as surgical debridement but he initially declined this.  As the wound progressed to the infection and ultimately had an MRI that did reveal osteomyelitis and abscess of the first metatarsal as well as osteomyelitis of the fourth, fifth metatarsal and  cuboid.  I recently took him for a bone culture as well as wound debridement and culture.  The cultures for the forthcoming fifth metatarsal as well as the cuboid were negative for growth however the first metatarsal was growing Pseudomonas as well as the wound culture.  Because of this I try to take him last week for a wound excision as well as a resection however he could not do it due to personal reasons until this week.  Discussed with him wound excision and partial first metatarsal resection given the infection.  Discussed alternatives risks and complications.  No problems or guarantees were given the fact of the procedure and all questions were answered to the best my ability.  Procedure in detail: The patient is both verbally and physically identified by myself, nursing staff, and the anesthesia staff in the preoperative holding area.  He was then transferred to the operating room via stretcher and placed on the operating table in supine position.  Once under adequate plane of anesthesia a well-padded ankle tourniquet was applied and sure to pad all bony prominences.  Of note I applied this however was not inflated during the procedure.  Next a mixture of 1% lidocaine plain and 0.5% Marcaine plain was infiltrated in a regional block fashion of the surgical site.  The right lower extremity was then scrubbed, prepped and draped in normal sterile fashion.  Attention was first directed to the plantar flap of the first metatarsal head remnant.  The wound  was excised with a #10 blade scalpel in elliptical fashion.  I was able to debride a large amount of nonviable tissue.  There was initial purulence coming from the area.  After that it was able to debride a large amount of nonviable tissue I would be to proceed with partial first ray resection.  A separate incision was made in the dorsal aspect of the first metatarsal with a #10 blade scalpel from skin to bone.  The first metatarsal was easily identifiable  and all soft tissue structures were freed from the distal portion of the metatarsal.  A sagittal bone saw was utilized to resect the first metatarsal to what I felt was healthy viable bone.  The distal portion of the wound appeared to be infected.  I then removed a separate piece of bone with a sagittal bone saw for clean margins and I split this and this was sent to pathology as well as microbiology.  The remaining bones appear to be hardening nature and white in color.  At this time there is found to be a large dead space present.  I elected to connect the 2 incisions as well as remove a large piece of skin in order to debulk the dead space.  This was completed with a #10 blade scalpel.  Again I was further able to debride all nonviable tissue with a scalpel.  There was no further purulence identified or any signs of infection remaining.  At this time the wound was copiously irrigated with 3 L of sterile saline and the wound culture was obtained.  At this point hemostasis was achieved.  The wound was then closed.  Unable to flap the remaining skin in order to debulk the area for wound closure.  The wound was closed with 2 oh as well as 3-0 Prolene.  I was able to remodel the skin for adequate closure.  I left the central part open and this was packed with iodoform packing.  During the procedure mixture of lidocaine and Marcaine plain was infiltrated for anesthetic.  At this point a dry sterile dressing and a bulky dressing was then applied.  He was found to have tolerated the procedure well any complications.  He was awoken from anesthesia and transferred to PACU with vital signs stable and vascular status intact.  We will admit him postoperatively for IV antibiotics and infectious disease consultation.  Celesta Gentile, DPM

## 2017-08-06 ENCOUNTER — Encounter (HOSPITAL_COMMUNITY): Payer: Self-pay | Admitting: Podiatry

## 2017-08-06 DIAGNOSIS — M86171 Other acute osteomyelitis, right ankle and foot: Secondary | ICD-10-CM

## 2017-08-06 DIAGNOSIS — D649 Anemia, unspecified: Secondary | ICD-10-CM

## 2017-08-06 LAB — CBC
HCT: 25.1 % — ABNORMAL LOW (ref 39.0–52.0)
Hemoglobin: 8.1 g/dL — ABNORMAL LOW (ref 13.0–17.0)
MCH: 25.8 pg — AB (ref 26.0–34.0)
MCHC: 32.3 g/dL (ref 30.0–36.0)
MCV: 79.9 fL (ref 78.0–100.0)
PLATELETS: 272 10*3/uL (ref 150–400)
RBC: 3.14 MIL/uL — AB (ref 4.22–5.81)
RDW: 16.7 % — AB (ref 11.5–15.5)
WBC: 10.6 10*3/uL — AB (ref 4.0–10.5)

## 2017-08-06 LAB — BASIC METABOLIC PANEL
ANION GAP: 5 (ref 5–15)
BUN: 13 mg/dL (ref 6–20)
CALCIUM: 7.7 mg/dL — AB (ref 8.9–10.3)
CO2: 25 mmol/L (ref 22–32)
Chloride: 104 mmol/L (ref 101–111)
Creatinine, Ser: 0.81 mg/dL (ref 0.61–1.24)
GFR calc Af Amer: >60 mL/min (ref >60)
GLUCOSE: 110 mg/dL — AB (ref 65–99)
Potassium: 3.6 mmol/L (ref 3.5–5.1)
SODIUM: 134 mmol/L — AB (ref 135–145)

## 2017-08-06 MED ORDER — MELATONIN 3 MG PO TABS
3.0000 mg | ORAL_TABLET | Freq: Every evening | ORAL | Status: DC | PRN
  Administered 2017-08-08 – 2017-08-09 (×2): 3 mg via ORAL
  Filled 2017-08-06 (×3): qty 1

## 2017-08-06 NOTE — Plan of Care (Signed)
  Problem: Nutrition: Goal: Adequate nutrition will be maintained Outcome: Progressing   Problem: Elimination: Goal: Will not experience complications related to bowel motility Outcome: Progressing   Problem: Pain Managment: Goal: General experience of comfort will improve Outcome: Progressing   Problem: Safety: Goal: Ability to remain free from injury will improve Outcome: Progressing   

## 2017-08-06 NOTE — Progress Notes (Signed)
Subjective: POD #1 s/p right foot partial 1st metatarsal resection and wound debridement.  He states that he is doing well and his pain is controlled. No fevers, chills, nausea, vomiting. He has no concerns.   Objective: AAO x3, NAD Bandage clean, dry, intact to the right foot. There is mild bloody strikethrough on the bandage.  The bandage was removed as well as the packing.  Small amount of bloody drainage was expressed but there is no purulence.  There is significant improvement in the swelling and there is very minimal surrounding erythema directly along the incision around the there is packing but there is no other erythema or ascending cellulitis.  There is no fluctuation or crepitation.  There is no malodor.  Overall the foot appears to be much improved compared to what it was prior to surgery. No pain with calf compression, warmth, erythema  Assessment: POD #1 s/p right partial 1st metatarsal resection, wound excision doing well   Plan: -All treatment options discussed with the patient including all alternatives, risks, complications.  -Postoperative x-rays were reviewed. -The dressing was changed.  Upon removal of the packing small amount of bloody drainage was expressed but there is no purulence.  Area was repacked with iodoform packing followed by dry sterile dressing. -Continue antibiotics for now.  I do recommend an infectious disease consultation.  Given the pathology that was read as "benign bone with osteomyelitis" I would like to have ID input. Still awaiting culture. I felt like during the surgery I was able to debride the bone to healthy bone and I did a a very thorough debridement of the soft tissue. He does not want to proceed with further amputation.  -NWB -Elevation -If stable likely discharge on Friday.  Upon discharge would recommend daily dressing changes.  F lush the wound with saline followed by iodoform dressing and a dry sterile dressing daily.  He has a friend who  is a nurse that can help to the dressings but would like to try to set up a home health nurse to come to his house to help with dressing changes. -If discharged Friday or Saturday I will see him back in the office on Monday for follow-up.   [place] and [place]: [phone] C: [phone]

## 2017-08-06 NOTE — Progress Notes (Signed)
PROGRESS NOTE  Quadre Bristol GMW:[id number] DOB: Sep 20, 1945 DOA: 08/05/2017 PCP: [name], MD  HPI/Recap of past 24 hours: Samuel Moran is a 72 y.o. male with a Past Medical History of 1 prior seizure; RA; Parkinson's; and recent onset of afib (CHA2DS2-VASc score 1) who presented today for ostectomy of the first metatarsal head.  TRH was consulted for medical management.  The patient was still somnolent in PACU at the time of my evaluation but reported minimal pain and no other complaints.  Osteomyelitis now s/p ostectomy, on Cipro per podiatry.  Other medical problems appear to be stable at this time.  TRH will medically manage the patient until discharge, likely Friday.  08/06/17: Patient seen and examined at his bedside.  Reports his right big toe pain is well managed on current pain medication.  Has no new complaints.  Assessment/Plan: Principal Problem:   Osteomyelitis of toe of right foot (HCC) Active Problems:   Osteomyelitis of right foot (HCC)   Rheumatoid arthritis, adult (HCC)   Parkinson disease (HCC)   HLD (hyperlipidemia)   Paroxysmal atrial fibrillation (HCC)   Normocytic anemia  Right big toe osteomyelitis status post right partial first metatarsal resection/wound excision POD#1 Continue pain management Continue dressing changes Podiatry following  Hyponatremia Sodium 134 Asymptomatic Repeat chemistry panel in the morning  Microcytic anemia/acute blood loss anemia Suspect from surgery Hemoglobin 8.1 MCV 79.9 Continue monitoring Repeat CBC in the morning  Parkinson's disease Continue home medications  Paroxysmal A. fib Normal lumbar anticoagulation Appears stable    Code Status: Full code  Family Communication: None at bedside  Disposition Plan: Home when clinically stable   Consultants:  Podiatry  Procedures:  Right ostectomy of complete metatarsal head  Antimicrobials:  None  DVT  prophylaxis: SCDs   Objective: Vitals:   08/05/17 1819 08/05/17 2046 08/06/17 0419 08/06/17 1500  BP: (!) 151/78 (!) 151/85 133/78 139/73  Pulse: 100 100 86 86  Resp: 17 18 17 16   Temp:  98.9 F (37.2 C) 98.4 F (36.9 C) 98.6 F (37 C)  TempSrc:  Oral Oral Oral  SpO2: 100% 96% 99% 100%  Weight:      Height:        Intake/Output Summary (Last 24 hours) at 08/06/2017 1706 Last data filed at 08/06/2017 1628 Gross per 24 hour  Intake 1985.83 ml  Output 2970 ml  Net -984.17 ml   Filed Weights   08/05/17 0800  Weight: 97.5 kg (215 lb)    Exam:   General: 72 year old Caucasian male well-developed well-nourished no acute distress.  Alert and oriented x3.  Cardiovascular: Regular rate and rhythm with no rubs or gallops.  No JVD or thyromegaly present.  Respiratory: Clear to auscultation auscultation with no wheezes or rales.  Abdomen: Soft nontender nondistended normal bowel sounds x4  Musculoskeletal: Draining noted at right first toe wound  Skin: As stated above  Psychiatry: Mood is appropriate for condition and setting   Data Reviewed: CBC: Recent Labs  Lab 08/05/17 0803 08/06/17 0603  WBC 8.5 10.6*  HGB 9.8* 8.1*  HCT 30.5* 25.1*  MCV 79.4 79.9  PLT 335 440   Basic Metabolic Panel: Recent Labs  Lab 08/05/17 0803 08/05/17 1544 08/06/17 0603  NA 136  --  134*  K 3.9  --  3.6  CL 108  --  104  CO2 19*  --  25  GLUCOSE 97  --  110*  BUN 17  --  13  CREATININE 0.71  --  0.81  CALCIUM 8.1*  --  7.7*  MG  --  1.9  --   PHOS  --  2.4*  --    GFR: Estimated Creatinine Clearance: 101.1 mL/min (by C-G formula based on SCr of 0.81 mg/dL). Liver Function Tests: No results for input(s): AST, ALT, ALKPHOS, BILITOT, PROT, ALBUMIN in the last 168 hours. No results for input(s): LIPASE, AMYLASE in the last 168 hours. No results for input(s): AMMONIA in the last 168 hours. Coagulation Profile: No results for input(s): INR, PROTIME in the last 168  hours. Cardiac Enzymes: No results for input(s): CKTOTAL, CKMB, CKMBINDEX, TROPONINI in the last 168 hours. BNP (last 3 results) No results for input(s): PROBNP in the last 8760 hours. HbA1C: No results for input(s): HGBA1C in the last 72 hours. CBG: No results for input(s): GLUCAP in the last 168 hours. Lipid Profile: No results for input(s): CHOL, HDL, LDLCALC, TRIG, CHOLHDL, LDLDIRECT in the last 72 hours. Thyroid Function Tests: No results for input(s): TSH, T4TOTAL, FREET4, T3FREE, THYROIDAB in the last 72 hours. Anemia Panel: Recent Labs    08/05/17 1544  VITAMINB12 202  FOLATE 17.7  FERRITIN 44  TIBC 225*  IRON 28*  RETICCTPCT 1.2   Urine analysis:    Component Value Date/Time   COLORURINE LT. YELLOW 07/26/2010 [place] 07/26/2010 0855   LABSPEC 1.025 07/26/2010 0855   PHURINE 5.5 07/26/2010 0855   GLUCOSEU NEGATIVE 07/26/2010 0855   HGBUR NEGATIVE 07/26/2010 0855   HGBUR negative 06/28/2008 0853   BILIRUBINUR n 10/17/2014 0954   KETONESUR NEGATIVE 07/26/2010 0855   PROTEINUR n 10/17/2014 0954   UROBILINOGEN 0.2 10/17/2014 0954   UROBILINOGEN 0.2 07/26/2010 0855   NITRITE n 10/17/2014 0954   NITRITE POSITIVE 07/26/2010 0855   LEUKOCYTESUR Negative 10/17/2014 0954   Sepsis Labs: @LABRCNTIP (procalcitonin:4,lacticidven:4)  ) Recent Results (from the past 240 hour(s))  Aerobic/Anaerobic Culture (surgical/deep wound)     Status: None (Preliminary result)   Collection Time: 08/05/17 11:41 AM  Result Value Ref Range Status   Specimen Description WOUND FOOT RIGHT  Final   Special Requests SPEC A/FIRST METATARSAL  Final   Gram Stain   Final    RARE WBC PRESENT,BOTH PMN AND MONONUCLEAR NO ORGANISMS SEEN    Culture   Final    NO GROWTH 1 DAY Performed at [facility], [place] [address] [place], [place] [address]    Report Status PENDING  Incomplete  Aerobic/Anaerobic Culture (surgical/deep wound)     Status: None (Preliminary result)    Collection Time: 08/05/17 12:17 PM  Result Value Ref Range Status   Specimen Description TISSUE FOOT RIGHT  Final   Special Requests SPEC B/FIRST METATARSAL  Final   Gram Stain   Final    FEW WBC PRESENT,BOTH PMN AND MONONUCLEAR NO ORGANISMS SEEN    Culture   Final    CULTURE REINCUBATED FOR BETTER GROWTH Performed at [facility], [place] [address], [place], [place] [address]    Report Status PENDING  Incomplete      Studies: No results found.  Scheduled Meds: . carbidopa-levodopa  1 tablet Oral TID  . Chlorhexidine Gluconate Cloth  6 each Topical Once   And  . Chlorhexidine Gluconate Cloth  6 each Topical Once  . naproxen  500 mg Oral BID WC  . omega-3 acid ethyl esters  1 g Oral BID  . pantoprazole  40 mg Oral Daily  . rotigotine  1 patch Transdermal Daily  Continuous Infusions: . ciprofloxacin 400 mg (08/06/17 1624)  . lactated ringers 50 mL/hr (08/05/17 0817)     LOS: 1 day     Kayleen Memos, MD [facility] Pager [phone]  If 7PM-7AM, please contact night-coverage [email or web address] Password [email or web address] 08/06/2017, 5:06 PM

## 2017-08-07 ENCOUNTER — Telehealth: Payer: Self-pay | Admitting: *Deleted

## 2017-08-07 ENCOUNTER — Encounter: Payer: Self-pay | Admitting: Podiatry

## 2017-08-07 DIAGNOSIS — Z89411 Acquired absence of right great toe: Secondary | ICD-10-CM

## 2017-08-07 DIAGNOSIS — G20 Parkinson's disease: Secondary | ICD-10-CM

## 2017-08-07 DIAGNOSIS — I480 Paroxysmal atrial fibrillation: Secondary | ICD-10-CM

## 2017-08-07 DIAGNOSIS — Z89421 Acquired absence of other right toe(s): Secondary | ICD-10-CM

## 2017-08-07 DIAGNOSIS — E785 Hyperlipidemia, unspecified: Secondary | ICD-10-CM

## 2017-08-07 DIAGNOSIS — M869 Osteomyelitis, unspecified: Principal | ICD-10-CM

## 2017-08-07 DIAGNOSIS — Z720 Tobacco use: Secondary | ICD-10-CM

## 2017-08-07 DIAGNOSIS — M069 Rheumatoid arthritis, unspecified: Secondary | ICD-10-CM

## 2017-08-07 LAB — BASIC METABOLIC PANEL
ANION GAP: 11 (ref 5–15)
BUN: 11 mg/dL (ref 6–20)
CO2: 20 mmol/L — AB (ref 22–32)
Calcium: 8.1 mg/dL — ABNORMAL LOW (ref 8.9–10.3)
Chloride: 105 mmol/L (ref 101–111)
Creatinine, Ser: 0.76 mg/dL (ref 0.61–1.24)
GLUCOSE: 131 mg/dL — AB (ref 65–99)
POTASSIUM: 3.6 mmol/L (ref 3.5–5.1)
Sodium: 136 mmol/L (ref 135–145)

## 2017-08-07 LAB — CBC
HEMATOCRIT: 28.2 % — AB (ref 39.0–52.0)
Hemoglobin: 9.1 g/dL — ABNORMAL LOW (ref 13.0–17.0)
MCH: 25.6 pg — ABNORMAL LOW (ref 26.0–34.0)
MCHC: 32.3 g/dL (ref 30.0–36.0)
MCV: 79.4 fL (ref 78.0–100.0)
PLATELETS: 291 10*3/uL (ref 150–400)
RBC: 3.55 MIL/uL — AB (ref 4.22–5.81)
RDW: 16.7 % — ABNORMAL HIGH (ref 11.5–15.5)
WBC: 8.6 10*3/uL (ref 4.0–10.5)

## 2017-08-07 LAB — C-REACTIVE PROTEIN: CRP: 9.1 mg/dL — AB (ref <1.0)

## 2017-08-07 LAB — SEDIMENTATION RATE: Sed Rate: 67 mm/hr — ABNORMAL HIGH (ref 0–16)

## 2017-08-07 MED ORDER — VANCOMYCIN HCL IN DEXTROSE 1-5 GM/200ML-% IV SOLN
1000.0000 mg | Freq: Three times a day (TID) | INTRAVENOUS | Status: DC
  Administered 2017-08-07 – 2017-08-09 (×7): 1000 mg via INTRAVENOUS
  Filled 2017-08-07 (×9): qty 200

## 2017-08-07 MED ORDER — SODIUM CHLORIDE 0.9 % IV SOLN
2.0000 g | Freq: Three times a day (TID) | INTRAVENOUS | Status: DC
  Administered 2017-08-07 – 2017-08-10 (×9): 2 g via INTRAVENOUS
  Filled 2017-08-07 (×15): qty 2

## 2017-08-07 MED ORDER — METRONIDAZOLE IN NACL 5-0.79 MG/ML-% IV SOLN
500.0000 mg | Freq: Three times a day (TID) | INTRAVENOUS | Status: DC
  Administered 2017-08-07: 500 mg via INTRAVENOUS
  Filled 2017-08-07 (×3): qty 100

## 2017-08-07 NOTE — Progress Notes (Signed)
PROGRESS NOTE  Samuel Moran [id number]:[id number] DOB: Aug 30, 1945 DOA: 08/05/2017 PCP: [name], MD  HPI/Recap of past 24 hours: Samuel Moran is a 72 y.o. male with a Past Medical History of 1 prior seizure; RA; Parkinson's; and recent onset of afib (CHA2DS2-VASc score 1) who presented today for ostectomy of the first metatarsal head.  TRH was consulted for medical management.  The patient was still somnolent in PACU at the time of my evaluation but reported minimal pain and no other complaints.  Osteomyelitis now s/p ostectomy, on Cipro per podiatry.  Other medical problems appear to be stable at this time.  TRH will medically manage the patient until discharge, likely Friday.  08/06/17: Patient seen and examined at his bedside.  Reports his right big toe pain is well managed on current pain medication.  Has no new complaints.  08/07/2017: Patient seen and examined at his bedside.  He denies any pain in his right foot.  Patient is status post first metatarsal resection and wound debridement POD #2.  He has minimal drainage out of the region.  Patient was on IV ciprofloxacin, will add IV Flagyl this morning.  Infectious disease has been consulted and will see the patient.  Assessment/Plan: Principal Problem:   Osteomyelitis of toe of right foot (HCC) Active Problems:   Osteomyelitis of right foot (HCC)   Rheumatoid arthritis, adult (HCC)   Parkinson disease (HCC)   HLD (hyperlipidemia)   Paroxysmal atrial fibrillation (HCC)   Normocytic anemia  Right foot osteomyelitis status post first metatarsal resection and wound debridement POD #2 Self-reported initial injury when he was 72 years old, in the last 3 years he has noted drainage in the area.  No pain. Is been followed by Dr. Jacqualyn Posey. MRI of the right foot done on 07/13/2017 revealed osteomyelitis to the first metatarsal remnant and also fourth and fifth metatarsal Continue pain management Continue dressing changes Continue IV  ciprofloxacin Started IV Flagyl 500 mg 3 times daily today 08/07/2017 Infectious disease consulted today.  Highly appreciated.  Hyponatremia Sodium 134 Asymptomatic Repeat chemistry panel in the morning  Microcytic anemia/acute blood loss anemia Suspect from surgical procedure Hemoglobin 8.1 MCV 79.9 Repeat CBC  Parkinson's disease Continue home medications  Paroxysmal A. fib Not on anticoagulation or beta blocker Appears stable    Code Status: Full code  Family Communication: None at bedside  Disposition Plan: Home when clinically stable   Consultants:  Podiatry  Procedures:  Right ostectomy of complete metatarsal head  Antimicrobials:  None  DVT prophylaxis: SCDs   Objective: Vitals:   08/06/17 0419 08/06/17 1500 08/06/17 2043 08/07/17 0620  BP: 133/78 139/73 138/72 (!) 141/83  Pulse: 86 86 86 81  Resp: 17 16 18 16   Temp: 98.4 F (36.9 C) 98.6 F (37 C) 98.7 F (37.1 C) (!) 97.5 F (36.4 C)  TempSrc: Oral Oral Oral Oral  SpO2: 99% 100% 99% 96%  Weight:      Height:        Intake/Output Summary (Last 24 hours) at 08/07/2017 1151 Last data filed at 08/07/2017 0900 Gross per 24 hour  Intake 1865.83 ml  Output 3340 ml  Net -1474.17 ml   Filed Weights   08/05/17 0800  Weight: 97.5 kg (215 lb)    Exam: 08/07/17   General: 72 year old Caucasian male well-developed well-nourished in no acute distress.  Alert and oriented x3.  Cardiovascular: Regular rate and rhythm with no rubs or gallops.  With no JVD or thyromegaly  Respiratory: Clear to auscultation auscultation with no wheezes or rales.  Abdomen: Soft nontender nondistended normal bowel sounds x4  Musculoskeletal: Draining noted at right first toe wound  Skin: As stated above  Psychiatry: Mood is appropriate for condition and setting   Data Reviewed: CBC: Recent Labs  Lab 08/05/17 0803 08/06/17 0603  WBC 8.5 10.6*  HGB 9.8* 8.1*  HCT 30.5* 25.1*  MCV 79.4 79.9  PLT 335 474    Basic Metabolic Panel: Recent Labs  Lab 08/05/17 0803 08/05/17 1544 08/06/17 0603  NA 136  --  134*  K 3.9  --  3.6  CL 108  --  104  CO2 19*  --  25  GLUCOSE 97  --  110*  BUN 17  --  13  CREATININE 0.71  --  0.81  CALCIUM 8.1*  --  7.7*  MG  --  1.9  --   PHOS  --  2.4*  --    GFR: Estimated Creatinine Clearance: 101.1 mL/min (by C-G formula based on SCr of 0.81 mg/dL). Liver Function Tests: No results for input(s): AST, ALT, ALKPHOS, BILITOT, PROT, ALBUMIN in the last 168 hours. No results for input(s): LIPASE, AMYLASE in the last 168 hours. No results for input(s): AMMONIA in the last 168 hours. Coagulation Profile: No results for input(s): INR, PROTIME in the last 168 hours. Cardiac Enzymes: No results for input(s): CKTOTAL, CKMB, CKMBINDEX, TROPONINI in the last 168 hours. BNP (last 3 results) No results for input(s): PROBNP in the last 8760 hours. HbA1C: No results for input(s): HGBA1C in the last 72 hours. CBG: No results for input(s): GLUCAP in the last 168 hours. Lipid Profile: No results for input(s): CHOL, HDL, LDLCALC, TRIG, CHOLHDL, LDLDIRECT in the last 72 hours. Thyroid Function Tests: No results for input(s): TSH, T4TOTAL, FREET4, T3FREE, THYROIDAB in the last 72 hours. Anemia Panel: Recent Labs    08/05/17 1544  VITAMINB12 202  FOLATE 17.7  FERRITIN 44  TIBC 225*  IRON 28*  RETICCTPCT 1.2   Urine analysis:    Component Value Date/Time   COLORURINE LT. YELLOW 07/26/2010 [place] 07/26/2010 0855   LABSPEC 1.025 07/26/2010 0855   PHURINE 5.5 07/26/2010 0855   GLUCOSEU NEGATIVE 07/26/2010 0855   HGBUR NEGATIVE 07/26/2010 0855   HGBUR negative 06/28/2008 0853   BILIRUBINUR n 10/17/2014 0954   KETONESUR NEGATIVE 07/26/2010 0855   PROTEINUR n 10/17/2014 0954   UROBILINOGEN 0.2 10/17/2014 0954   UROBILINOGEN 0.2 07/26/2010 0855   NITRITE n 10/17/2014 0954   NITRITE POSITIVE 07/26/2010 0855   LEUKOCYTESUR Negative  10/17/2014 0954   Sepsis Labs: @LABRCNTIP (procalcitonin:4,lacticidven:4)  ) Recent Results (from the past 240 hour(s))  Aerobic/Anaerobic Culture (surgical/deep wound)     Status: None (Preliminary result)   Collection Time: 08/05/17 11:41 AM  Result Value Ref Range Status   Specimen Description WOUND FOOT RIGHT  Final   Special Requests SPEC A/FIRST METATARSAL  Final   Gram Stain   Final    RARE WBC PRESENT,BOTH PMN AND MONONUCLEAR NO ORGANISMS SEEN    Culture   Final    NO GROWTH 2 DAYS Performed at [facility], [place] [address], [place], [place] [address]    Report Status PENDING  Incomplete  Aerobic/Anaerobic Culture (surgical/deep wound)     Status: None (Preliminary result)   Collection Time: 08/05/17 12:17 PM  Result Value Ref Range Status   Specimen Description TISSUE FOOT RIGHT  Final   Special Requests SPEC  B/FIRST METATARSAL  Final   Gram Stain   Final    FEW WBC PRESENT,BOTH PMN AND MONONUCLEAR NO ORGANISMS SEEN    Culture   Final    CULTURE REINCUBATED FOR BETTER GROWTH Performed at [facility], [address], [place], [place] [address]    Report Status PENDING  Incomplete      Studies: No results found.  Scheduled Meds: . carbidopa-levodopa  1 tablet Oral TID  . Chlorhexidine Gluconate Cloth  6 each Topical Once   And  . Chlorhexidine Gluconate Cloth  6 each Topical Once  . naproxen  500 mg Oral BID WC  . omega-3 acid ethyl esters  1 g Oral BID  . pantoprazole  40 mg Oral Daily  . rotigotine  1 patch Transdermal Daily    Continuous Infusions: . ciprofloxacin 400 mg (08/07/17 0431)  . lactated ringers 50 mL/hr (08/05/17 0817)  . metronidazole       LOS: 2 days     Kayleen Memos, MD [facility] Pager [phone]  If 7PM-7AM, please contact night-coverage [email or web address] Password [email or web address] 08/07/2017, 11:51 AM

## 2017-08-07 NOTE — Progress Notes (Signed)
Subjective: POD #1 s/p right foot partial 1st metatarsal resection and wound debridement.  He is frustrated with the events that happened today in regards to the cultures. He brings up the idea of a PICC line and wants to know my thoughts on it.   He states that he is doing well and his pain is controlled. No fevers, chills, nausea, vomiting. He has no concerns.   Objective: AAO x3, NAD Bandage clean, dry, intact to the right foot. There is minimal bloody strikethrough on the bandage.  The bandage was removed as well as the packing.  Minimal bloody drainage was expressed but there is no purulence.  There continues to be significant improvement in the swelling. There is no erythema around the incision and no purulence. No fluctuance or crepitance. No open lesions to the foot otherwise and there is no other areas of abscess I am able to see today. Overall, the foot looks much better compared to before surgery.  No pain with calf compression, warmth, erythema  Assessment: POD #2 s/p right partial 1st metatarsal resection, wound excision  Plan: -All treatment options discussed with the patient including all alternatives, risks, complications.  -Reviewed the culture results. I greatly appreciate Dr. Algis Downs input on this. I had a discussion with Mr. Correia today and he is open to having a PICC line. I discussed again he is at high risk of limb loss and this would be the best option for him. He brought up the idea of more surgery and was not thrilled about this. I will discuss with him tomorrow returning to surgery on possibly Sunday for further washout of the wound and to remove the rest of the 1st metatarsal. I was surprised as well by the positive culture and pathology. I felt that I had done a through debridement and the remaining bone looked healthy. Unfortunatly there is still bacteria and would possibly benefit from further surgery. With that said, I am still concerned about the MRI findings from  the other week. I had preformed bone biopsies and and I&D, wound debridement and the culture on the other bones in question were no growth. I am still concerned there is osteomyelitis in those bones. Even if we go back to surgery to remove the small remnant of the 1st metatarsal I think he would benefit still from prolonged antibiotic therapy, likely PICC line. Will discuss with him further tomorrow.  -I have discussed a TMA/Lisfranc disarticulation perviously and he declined. This has been a challenging case. I have wanted to do surgery before this but he was not willing and he also delayed the MRI.  He was not willing to do the PICC line but after a frank discussion today he is open to this and discussed he needs to be NWB (he admits that he has been very active on his feet and not taking care of this). His friend came by while were talking Malachy Mood) and she is going to help him more. He came to the conclusion that he was not going to drive to [place] later this month on his own and I was proud that he made a decision that would benefit his recovery. This has been an ongoing topic recently.  -Upon discharge will need home health for dressing changes and also likely for the PICC line.  -Will continue to follow. Thanks for everyone's help.   Noralee Space: [phone] C: [phone]

## 2017-08-07 NOTE — Consult Note (Signed)
[facility] for Infectious Disease    Date of Admission:  08/05/2017   Total days of antibiotics: 2 cipro, flagyl               Reason for Consult:  Osteo of R foot  Referring Provider: Nevada Crane   Assessment: R foot osteomyelitis  Plan: 1. Change cipro to ceftaz 2. Start vanco while awaiting Cx of coag neg staph on 4-3 3. I suggested he may need a PIC, and he made it clear he wants po therapy. 4. Check esr and Crp  Comment- Very complicated infection per his MRI.  Hopefully will be able to get him correct anbx as he wishes, for a prolonged period.   Thank you so much for this interesting consult,  Principal Problem:   Osteomyelitis of toe of right foot ([place]) Active Problems:   Osteomyelitis of right foot (HCC)   Rheumatoid arthritis, adult ([place])   Parkinson disease ([place])   HLD (hyperlipidemia)   Paroxysmal atrial fibrillation (HCC)   Normocytic anemia   . carbidopa-levodopa  1 tablet Oral TID  . Chlorhexidine Gluconate Cloth  6 each Topical Once   And  . Chlorhexidine Gluconate Cloth  6 each Topical Once  . naproxen  500 mg Oral BID WC  . omega-3 acid ethyl esters  1 g Oral BID  . pantoprazole  40 mg Oral Daily  . rotigotine  1 patch Transdermal Daily    HPI: Samuel Moran is a 72 y.o. male with hx of Parkinson's, RA (neupro patch, arava). 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 He has had arthritis in this area and now recurrent infections. He had distal amputation ~ 1 year ago and has had another additional surgery.  As f/u he had MRI of his foot 07-13-17: *Findings consistent with osteomyelitis throughout the first metatarsal remnant and majority of the fourth metatarsal remnant. *Somewhat milder degree of edema and enhancement in the proximal 4.2 cm of the fifth metatarsal is also worrisome for osteomyelitis. *Edema and enhancement about the tarsometatarsal joints is worst at the second third  and likely degenerative or neuropathic. *Marrow edema and enhancement in the lateral periphery of the cuboid worrisome for osteomyelitis. *Large rim enhancing fluid collections distal to the first and fourth metatarsal remnants most consistent with abscesses.   Prior to adm he had worsening swelling, pain. He had had 2 in office drainage procedures. He had a previous Cx which grew Pseudomonas (pan-sens) on 3-4.  No f/c.  On adm he was taken to OR and had 1st MT resection.   He was on anbx prior to adm (unable to remember name- levaquin?).   Review of Systems: Review of Systems  Constitutional: Negative for chills, fever and weight loss.  Respiratory: Negative for cough and shortness of breath.   Cardiovascular: Positive for leg swelling.  Gastrointestinal: Negative for constipation and diarrhea.  Genitourinary: Negative for dysuria.  Musculoskeletal: Positive for joint pain.  he denies hx of DM Please see HPI. All other systems reviewed and negative.   Past Medical History:  Diagnosis Date  . Atrial fibrillation ([place])    onset 07/17/17  . Dysrhythmia   . ED (erectile dysfunction)   . GERD 07/01/2007  . Parkinson's disease ([place])   . 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 PROSTATE SPECIFIC ANTIGEN, ELEVATED 06/29/2007  . Rheumatoid arthritis ([place])   . SEIZURE DISORDER  07/01/2007   one epsiode only  . Spinal stenosis of lumbar region 09/22/2014    Social History   Tobacco Use  . Smoking status: Light Tobacco Smoker    Types: Cigars  . Smokeless tobacco: Never Used  . Tobacco comment: 1 cigar a week  Substance Use Topics  . Alcohol use: Yes    Alcohol/week: 2.0 oz    Types: 4 Standard drinks or equivalent per week    Comment: one drink a day (liquor)  . Drug use: No    Family History  Problem Relation Age of Onset  . Arthritis Mother   . Alcoholism Mother   . Arthritis Father        died after fall/trauma and had TBI  . Colon cancer Neg Hx   . Esophageal cancer Neg  Hx   . Rectal cancer Neg Hx   . Stomach cancer Neg Hx      Medications:  Scheduled: . carbidopa-levodopa  1 tablet Oral TID  . Chlorhexidine Gluconate Cloth  6 each Topical Once   And  . Chlorhexidine Gluconate Cloth  6 each Topical Once  . naproxen  500 mg Oral BID WC  . omega-3 acid ethyl esters  1 g Oral BID  . pantoprazole  40 mg Oral Daily  . rotigotine  1 patch Transdermal Daily    Abtx:  Anti-infectives (From admission, onward)   Start     Dose/Rate Route Frequency Ordered Stop   08/07/17 1200  metroNIDAZOLE (FLAGYL) IVPB 500 mg     500 mg 100 mL/hr over 60 Minutes Intravenous Every 8 hours 08/07/17 1148     08/05/17 1600  ciprofloxacin (CIPRO) IVPB 400 mg     400 mg 200 mL/hr over 60 Minutes Intravenous Every 12 hours 08/05/17 1254     08/05/17 0700  ceFAZolin (ANCEF) 3 g in dextrose 5 % 50 mL IVPB     3 g 130 mL/hr over 30 Minutes Intravenous To Short Stay 08/04/17 0851 08/05/17 1104        OBJECTIVE: Blood pressure (!) 141/83, pulse 81, temperature (!) 97.5 F (36.4 C), temperature source Oral, resp. rate 16, height 5' 11"  (1.803 m), weight 97.5 kg (215 lb), SpO2 96 %.  Physical Exam  Constitutional: He is oriented to person, place, and time and well-developed, well-nourished, and in no distress. No distress.  HENT:  Mouth/Throat: No oropharyngeal exudate.  Eyes: Pupils are equal, round, and reactive to light. EOM are normal.  Neck: Neck supple.  Cardiovascular: An irregularly irregular rhythm present.  Pulmonary/Chest: Effort normal and breath sounds normal.  Abdominal: Soft. Bowel sounds are normal. There is no tenderness. There is no rebound.  Musculoskeletal:       Feet:  Lymphadenopathy:    He has no cervical adenopathy.  Neurological: He is alert and oriented to person, place, and time. No sensory deficit.  Skin: He is not diaphoretic.  Psychiatric: Mood and affect normal.    Lab Results Results for orders placed or performed during the  hospital encounter of 08/05/17 (from the past 48 hour(s))  Vitamin B12     Status: None   Collection Time: 08/05/17  3:44 PM  Result Value Ref Range   Vitamin B-12 202 180 - 914 pg/mL    Comment: (NOTE) This assay is not validated for testing neonatal or myeloproliferative syndrome specimens for Vitamin B12 levels. Performed at [facility], [place] [address], [place], [place] [address]   Folate     Status:  None   Collection Time: 08/05/17  3:44 PM  Result Value Ref Range   Folate 17.7 >5.9 ng/mL    Comment: Performed at [facility], [place] [address], [place], [place] [address]  Iron and TIBC     Status: Abnormal   Collection Time: 08/05/17  3:44 PM  Result Value Ref Range   Iron 28 (L) 45 - 182 ug/dL   TIBC 225 (L) 250 - 450 ug/dL   Saturation Ratios 12 (L) 17.9 - 39.5 %   UIBC 197 ug/dL    Comment: Performed at [facility] [address], [place], [place] [address]  Ferritin     Status: None   Collection Time: 08/05/17  3:44 PM  Result Value Ref Range   Ferritin 44 24 - 336 ng/mL    Comment: Performed at [facility] [address] [place], [place] [address]  Reticulocytes     Status: Abnormal   Collection Time: 08/05/17  3:44 PM  Result Value Ref Range   Retic Ct Pct 1.2 0.4 - 3.1 %   RBC. 3.68 (L) 4.22 - 5.81 MIL/uL   Retic Count, Absolute 44.2 19.0 - 186.0 K/uL    Comment: Performed at [facility], [place] [address] [place], [place] [address]  Magnesium     Status: None   Collection Time: 08/05/17  3:44 PM  Result Value Ref Range   Magnesium 1.9 1.7 - 2.4 mg/dL    Comment: Performed at [facility], [place] [address] [place], [place] [address]  Phosphorus     Status: Abnormal   Collection Time: 08/05/17  3:44 PM  Result Value Ref Range   Phosphorus 2.4 (L) 2.5 - 4.6 mg/dL    Comment: Performed at [facility] [address], [place], [place] [address]  CBC     Status: Abnormal   Collection Time: 08/06/17  6:03 AM  Result  Value Ref Range   WBC 10.6 (H) 4.0 - 10.5 K/uL   RBC 3.14 (L) 4.22 - 5.81 MIL/uL   Hemoglobin 8.1 (L) 13.0 - 17.0 g/dL   HCT 25.1 (L) 39.0 - 52.0 %   MCV 79.9 78.0 - 100.0 fL   MCH 25.8 (L) 26.0 - 34.0 pg   MCHC 32.3 30.0 - 36.0 g/dL   RDW 16.7 (H) 11.5 - 15.5 %   Platelets 272 150 - 400 K/uL    Comment: Performed at [facility], [place] [address], [place], [place] [address]  Basic metabolic panel     Status: Abnormal   Collection Time: 08/06/17  6:03 AM  Result Value Ref Range   Sodium 134 (L) 135 - 145 mmol/L   Potassium 3.6 3.5 - 5.1 mmol/L   Chloride 104 101 - 111 mmol/L   CO2 25 22 - 32 mmol/L   Glucose, Bld 110 (H) 65 - 99 mg/dL   BUN 13 6 - 20 mg/dL   Creatinine, Ser 0.81 0.61 - 1.24 mg/dL   Calcium 7.7 (L) 8.9 - 10.3 mg/dL   GFR calc non Af Amer >60 >60 mL/min   GFR calc Af Amer >60 >60 mL/min    Comment: (NOTE) The eGFR has been calculated using the CKD EPI equation. This calculation has not been validated in all clinical situations. eGFR's persistently <60 mL/min signify possible Chronic Kidney Disease.    Anion gap 5 5 - 15    Comment: Performed at [facility]  [address] [place], [place] [address]  CBC     Status: Abnormal   Collection Time: 08/07/17 12:50 PM  Result Value Ref Range   WBC 8.6 4.0 - 10.5 K/uL   RBC 3.55 (L) 4.22 - 5.81 MIL/uL   Hemoglobin 9.1 (L) 13.0 - 17.0 g/dL   HCT 28.2 (L) 39.0 - 52.0 %   MCV 79.4 78.0 - 100.0 fL   MCH 25.6 (L) 26.0 - 34.0 pg   MCHC 32.3 30.0 - 36.0 g/dL   RDW 16.7 (H) 11.5 - 15.5 %   Platelets 291 150 - 400 K/uL    Comment: Performed at [facility], [place] [address], [place], [place] [address]  Basic metabolic panel     Status: Abnormal   Collection Time: 08/07/17 12:50 PM  Result Value Ref Range   Sodium 136 135 - 145 mmol/L   Potassium 3.6 3.5 - 5.1 mmol/L   Chloride 105 101 - 111 mmol/L   CO2 20 (L) 22 - 32 mmol/L   Glucose, Bld 131 (H) 65 - 99 mg/dL   BUN 11 6 - 20 mg/dL   Creatinine, Ser  0.76 0.61 - 1.24 mg/dL   Calcium 8.1 (L) 8.9 - 10.3 mg/dL   GFR calc non Af Amer >60 >60 mL/min   GFR calc Af Amer >60 >60 mL/min    Comment: (NOTE) The eGFR has been calculated using the CKD EPI equation. This calculation has not been validated in all clinical situations. eGFR's persistently <60 mL/min signify possible Chronic Kidney Disease.    Anion gap 11 5 - 15    Comment: Performed at [facility] [address], [place], [place] [address]      Component Value Date/Time   SDES TISSUE FOOT RIGHT 08/05/2017 1217   SPECREQUEST SPEC B/FIRST METATARSAL 08/05/2017 1217   CULT  08/05/2017 1217    RARE STAPHYLOCOCCUS SPECIES (COAGULASE NEGATIVE) SUSCEPTIBILITIES TO FOLLOW NO ANAEROBES ISOLATED; CULTURE IN PROGRESS FOR 5 DAYS RESULT CALLED TO, READ BACK BY AND VERIFIED WITH: RN Vick Frees [phone] MLM Performed at [facility], [address], [place], [place] [address]    REPTSTATUS PENDING 08/05/2017 1217   Dg Foot Complete Right  Result Date: 08/05/2017 CLINICAL DATA:  Postop check EXAM: RIGHT FOOT COMPLETE - 3+ VIEW COMPARISON:  08/03/2017 FINDINGS: Interval further amputation of the first metatarsal with the metatarsal base remaining. Postsurgical changes in the soft tissues of the stump. Dorsal dislocation of the third proximal phalanx relative to the metatarsal head. Prior resection of the fourth metatarsal head. IMPRESSION: Interval further amputation of the first metatarsal with the metatarsal base remaining. Postsurgical changes in the soft tissues of the stump. Electronically Signed   By: Kathreen Devoid   On: 08/05/2017 15:40   Recent Results (from the past 240 hour(s))  Aerobic/Anaerobic Culture (surgical/deep wound)     Status: None (Preliminary result)   Collection Time: 08/05/17 11:41 AM  Result Value Ref Range Status   Specimen Description WOUND FOOT RIGHT  Final   Special Requests SPEC A/FIRST METATARSAL  Final   Gram Stain   Final    RARE WBC  PRESENT,BOTH PMN AND MONONUCLEAR NO ORGANISMS SEEN    Culture   Final    NO GROWTH 2 DAYS Performed at [facility], [address], [place], [place] [address]    Report Status PENDING  Incomplete  Aerobic/Anaerobic Culture (surgical/deep wound)     Status: None (Preliminary result)   Collection Time:  08/05/17 12:17 PM  Result Value Ref Range Status   Specimen Description TISSUE FOOT RIGHT  Final   Special Requests SPEC B/FIRST METATARSAL  Final   Gram Stain   Final    FEW WBC PRESENT,BOTH PMN AND MONONUCLEAR NO ORGANISMS SEEN    Culture   Final    RARE STAPHYLOCOCCUS SPECIES (COAGULASE NEGATIVE) SUSCEPTIBILITIES TO FOLLOW NO ANAEROBES ISOLATED; CULTURE IN PROGRESS FOR 5 DAYS RESULT CALLED TO, READ BACK BY AND VERIFIED WITH: RN Vick Frees [phone] MLM Performed at [facility], [place] [address], [place], [place] [address]    Report Status PENDING  Incomplete    Microbiology: Recent Results (from the past 240 hour(s))  Aerobic/Anaerobic Culture (surgical/deep wound)     Status: None (Preliminary result)   Collection Time: 08/05/17 11:41 AM  Result Value Ref Range Status   Specimen Description WOUND FOOT RIGHT  Final   Special Requests SPEC A/FIRST METATARSAL  Final   Gram Stain   Final    RARE WBC PRESENT,BOTH PMN AND MONONUCLEAR NO ORGANISMS SEEN    Culture   Final    NO GROWTH 2 DAYS Performed at [facility], [place] [address] [place], [place] [address]    Report Status PENDING  Incomplete  Aerobic/Anaerobic Culture (surgical/deep wound)     Status: None (Preliminary result)   Collection Time: 08/05/17 12:17 PM  Result Value Ref Range Status   Specimen Description TISSUE FOOT RIGHT  Final   Special Requests SPEC B/FIRST METATARSAL  Final   Gram Stain   Final    FEW WBC PRESENT,BOTH PMN AND MONONUCLEAR NO ORGANISMS SEEN    Culture   Final    RARE STAPHYLOCOCCUS SPECIES (COAGULASE NEGATIVE) SUSCEPTIBILITIES TO FOLLOW NO ANAEROBES ISOLATED;  CULTURE IN PROGRESS FOR 5 DAYS RESULT CALLED TO, READ BACK BY AND VERIFIED WITH: RN Vick Frees [phone] MLM Performed at [facility], [place] [address] [place],  [address]    Report Status PENDING  Incomplete    Radiographs and labs were personally reviewed by me.   Bobby Rumpf, MD [facility] [phone] 08/07/2017, 2:23 PM

## 2017-08-07 NOTE — Progress Notes (Addendum)
Pharmacy Antibiotic Note  Samuel Moran is a 72 y.o. male admitted on 08/05/2017   Plan: Dc cipro, flagyl ceftaz 2 g q8 Vanc 1 g q8h   Height: 5\' 11"  (180.3 cm) Weight: 215 lb (97.5 kg) IBW/kg (Calculated) : 75.3  Temp (24hrs), Avg:98.2 F (36.8 C), Min:97.5 F (36.4 C), Max:98.7 F (37.1 C)  Recent Labs  Lab 08/05/17 0803 08/06/17 0603 08/07/17 1250  WBC 8.5 10.6* 8.6  CREATININE 0.71 0.81 0.76    Estimated Creatinine Clearance: 102.3 mL/min (by C-G formula based on SCr of 0.76 mg/dL).    No Known Allergies  Levester Fresh, PharmD, BCPS, BCCCP Clinical Pharmacist Clinical phone for 08/07/2017 from 1430 [phone]: [phone] If after 2300, please call main pharmacy at: [phone] 08/07/2017 3:21 PM

## 2017-08-07 NOTE — Telephone Encounter (Signed)
Called and spoke with the patient yesterday concerning the surgery patient had with Dr Jacqualyn Posey and the patient stated that he was doing fine and that he was icing and elevating his foot and there was no fever or chills and no nausea and was admitted to the hospital and Dr Jacqualyn Posey was to come by and change the bandages yesterday and I stated to call the office if any concerns or questions. Lattie Haw

## 2017-08-08 ENCOUNTER — Inpatient Hospital Stay: Payer: Self-pay

## 2017-08-08 DIAGNOSIS — B965 Pseudomonas (aeruginosa) (mallei) (pseudomallei) as the cause of diseases classified elsewhere: Secondary | ICD-10-CM

## 2017-08-08 DIAGNOSIS — Z09 Encounter for follow-up examination after completed treatment for conditions other than malignant neoplasm: Secondary | ICD-10-CM

## 2017-08-08 NOTE — Progress Notes (Signed)
INFECTIOUS DISEASE PROGRESS NOTE  ID: Samuel Moran is a 72 y.o. male with  Principal Problem:   Osteomyelitis of toe of right foot ([place]) Active Problems:   Osteomyelitis of right foot (HCC)   Rheumatoid arthritis, adult ([place])   Parkinson disease ([place])   HLD (hyperlipidemia)   Paroxysmal atrial fibrillation (HCC)   Normocytic anemia  Subjective: No complaints.   Abtx:  Anti-infectives (From admission, onward)   Start     Dose/Rate Route Frequency Ordered Stop   08/07/17 1600  vancomycin (VANCOCIN) IVPB 1000 mg/200 mL premix     1,000 mg 200 mL/hr over 60 Minutes Intravenous Every 8 hours 08/07/17 1520     08/07/17 1600  cefTAZidime (FORTAZ) 2 g in sodium chloride 0.9 % 100 mL IVPB     2 g 200 mL/hr over 30 Minutes Intravenous Every 8 hours 08/07/17 1520     08/07/17 1200  metroNIDAZOLE (FLAGYL) IVPB 500 mg  Status:  Discontinued     500 mg 100 mL/hr over 60 Minutes Intravenous Every 8 hours 08/07/17 1148 08/07/17 1524   08/05/17 1600  ciprofloxacin (CIPRO) IVPB 400 mg  Status:  Discontinued     400 mg 200 mL/hr over 60 Minutes Intravenous Every 12 hours 08/05/17 1254 08/07/17 1522   08/05/17 0700  ceFAZolin (ANCEF) 3 g in dextrose 5 % 50 mL IVPB     3 g 130 mL/hr over 30 Minutes Intravenous To Short Stay 08/04/17 0851 08/05/17 1104      Medications:  Scheduled: . carbidopa-levodopa  1 tablet Oral TID  . Chlorhexidine Gluconate Cloth  6 each Topical Once   And  . Chlorhexidine Gluconate Cloth  6 each Topical Once  . naproxen  500 mg Oral BID WC  . omega-3 acid ethyl esters  1 g Oral BID  . pantoprazole  40 mg Oral Daily  . rotigotine  1 patch Transdermal Daily    Objective: Vital signs in last 24 hours: Temp:  [97.6 F (36.4 C)-98.4 F (36.9 C)] 97.6 F (36.4 C) (04/06 0612) Pulse Rate:  [76-94] 76 (04/06 0612) BP: (152-158)/(85-92) 152/92 (04/06 0612) SpO2:  [96 %-99 %] 96 % (04/06 0612)   General appearance: alert, cooperative and no  distress Resp: clear to auscultation bilaterally Cardio: regular rate and rhythm GI: normal findings: bowel sounds normal and soft, non-tender Incision/Wound: R foot wrapped.   Lab Results Recent Labs    08/06/17 0603 08/07/17 1250  WBC 10.6* 8.6  HGB 8.1* 9.1*  HCT 25.1* 28.2*  NA 134* 136  K 3.6 3.6  CL 104 105  CO2 25 20*  BUN 13 11  CREATININE 0.81 0.76   Liver Panel No results for input(s): PROT, ALBUMIN, AST, ALT, ALKPHOS, BILITOT, BILIDIR, IBILI in the last 72 hours. Sedimentation Rate Recent Labs    08/07/17 1512  ESRSEDRATE 67*   C-Reactive Protein Recent Labs    08/07/17 1512  CRP 9.1*    Microbiology: Recent Results (from the past 240 hour(s))  Aerobic/Anaerobic Culture (surgical/deep wound)     Status: None (Preliminary result)   Collection Time: 08/05/17 11:41 AM  Result Value Ref Range Status   Specimen Description WOUND FOOT RIGHT  Final   Special Requests SPEC A/FIRST METATARSAL  Final   Gram Stain   Final    RARE WBC PRESENT,BOTH PMN AND MONONUCLEAR NO ORGANISMS SEEN    Culture   Final    NO GROWTH 3 DAYS NO ANAEROBES ISOLATED; CULTURE IN PROGRESS FOR 5 DAYS  Performed at [facility], [place] [address] [place], [place] [address]    Report Status PENDING  Incomplete  Aerobic/Anaerobic Culture (surgical/deep wound)     Status: None (Preliminary result)   Collection Time: 08/05/17 12:17 PM  Result Value Ref Range Status   Specimen Description TISSUE FOOT RIGHT  Final   Special Requests SPEC B/FIRST METATARSAL  Final   Gram Stain   Final    FEW WBC PRESENT,BOTH PMN AND MONONUCLEAR NO ORGANISMS SEEN    Culture   Final    RARE STAPHYLOCOCCUS SPECIES (COAGULASE NEGATIVE) NO ANAEROBES ISOLATED; CULTURE IN PROGRESS FOR 5 DAYS CRITICAL RESULT CALLED TO, READ BACK BY AND VERIFIED WITH: RN Vick Frees [phone] MLM Performed at [facility], [place] [address], [place], [place] [address]    Report Status PENDING  Incomplete   Organism  ID, Bacteria STAPHYLOCOCCUS SPECIES (COAGULASE NEGATIVE)  Final      Susceptibility   Staphylococcus species (coagulase negative) - MIC*    CIPROFLOXACIN >=8 RESISTANT Resistant     ERYTHROMYCIN >=8 RESISTANT Resistant     GENTAMICIN <=0.5 SENSITIVE Sensitive     OXACILLIN >=4 RESISTANT Resistant     TETRACYCLINE 2 SENSITIVE Sensitive     VANCOMYCIN 1 SENSITIVE Sensitive     TRIMETH/SULFA 80 RESISTANT Resistant     CLINDAMYCIN >=8 RESISTANT Resistant     RIFAMPIN <=0.5 SENSITIVE Sensitive     Inducible Clindamycin NEGATIVE Sensitive     * RARE STAPHYLOCOCCUS SPECIES (COAGULASE NEGATIVE)    Studies/Results: No results found.   Assessment/Plan: Osteomyelitis of R foot Pseudomonas (in office Cx) pan-sens MRSE op Cx  Total days of antibiotics: 3 ceftaz, vanco  CRP 9.1 ESR 67   Now willing to have PIC and home IV. I explained to him this is the most aggressive way we can treat his foot.  Would rec 6 weeks of IV vanco and ceftaz Spoke with CM Will order PIC.  Will see him in clinic for f/u  No Known Allergies  OPAT Orders Discharge antibiotics: ceftaz, vanco Per pharmacy protocol vanco Aim for Vancomycin trough 15-20 (unless otherwise indicated) Duration: 39 days End Date: Sep 16, 2017  [facility] Care Per Protocol: please  Labs weekly while on IV antibiotics: _x_ CBC with differential __ BMP _x_ CMP _x_ CRP _x_ ESR _x_ Vancomycin trough  _x_ Please pull PIC at completion of IV antibiotics __ Please leave PIC in place until doctor has seen patient or been notified  Fax weekly labs to [phone]  Clinic Follow Up Appt: Janiyla Long 4 weeks        Bobby Rumpf MD, FACP Infectious Diseases (pager) [phone] [email or web address] 08/08/2017, 1:23 PM  LOS: 3 days

## 2017-08-08 NOTE — Progress Notes (Signed)
Subjective: POD #1 s/p right foot partial 1st metatarsal resection and wound debridement.  He is in better spirits today. He states that he is willing to do a PICC line if needed and he is ready to go home.   He has had no pain to the surgical foot. No fevers, chills, nausea, vomiting. He has no concerns.   Objective: AAO x3, NAD-sitting in the chair with Darco wedge shoe on. Bandage clean, dry, intact to the right foot.  Mild bloody strikethrough on the gauze.  Upon evaluation of the wound the packing was removed and there was only bloody drainage and there is no purulence.  There is significantly improved swelling to the foot there is no surrounding erythema, ascending cellulitis.  There is no fluctuation or crepitation.  There is no malodor.  There is no other ulcerations identified to the right foot.  There is no erythema or increase in warmth to the other areas of the foot.  Overall the foot continues to look better, he agrees with this. No pain with calf compression, warmth, erythema  Assessment: POD #3 s/p right partial 1st metatarsal resection, wound excision  Plan: -All treatment options discussed with the patient including all alternatives, risks, complications.  -We can have a discussion today in regards to his treatment options.  We discussed further surgery to remove the first metatarsal base as well as prolonged antibiotics.   At this point he wishes to hold off on further surgery treat with antibiotics.  Discussed risks and benefits of this he wants to go and proceed with a PICC line and prolonged antibiotic course and would like to be discharged as soon as possible. Was able to speak with Dr. [place] Crane in the room today with the patient and we are all in agreement with the PICC line. Hopefully we can get this done today in order to help with discharge. He declines further surgery at this point.  -Continue NWB -Will need home health for infusions and well as for dressing changes. The  dressing changes are going to consist of flushing the wound with saline, pack with 1/2 inch iodoform and apply guaze and an ACE wrap for compression. This was done today as well.  -I will continue to follow-up while inpatient and close follow-up as an outpatient as well.   Noralee Space: [phone] C: [phone]

## 2017-08-08 NOTE — Plan of Care (Signed)
  Problem: Health Behavior/Discharge Planning: Goal: Ability to manage health-related needs will improve Outcome: Progressing   Problem: Clinical Measurements: Goal: Ability to maintain clinical measurements within normal limits will improve Outcome: Progressing Goal: Will remain free from infection Outcome: Progressing Goal: Diagnostic test results will improve Outcome: Progressing Goal: Respiratory complications will improve Outcome: Progressing Goal: Cardiovascular complication will be avoided Outcome: Progressing   Problem: Activity: Goal: Risk for activity intolerance will decrease Outcome: Progressing   Problem: Safety: Goal: Ability to remain free from injury will improve Outcome: Progressing   Problem: Skin Integrity: Goal: Risk for impaired skin integrity will decrease Outcome: Progressing   

## 2017-08-08 NOTE — Care Management Note (Signed)
Case Management Note  Patient Details  Name: Pearce Littlefield MRN: [id number] Date of Birth: 05-12-1945  Subjective/Objective:   72 yr old gentleman admitted with osteo of right foot, 1st metatarsal. 08/07/17 Patient underwent  Right first ray ostectomy.              Action/Plan: Case manager spoke with patient concerning dischareg plan and need for IV antibiotics at home. Choice for Home Health Agency was offered, patient says [facility]. Case Manage called referral to Melene Muller for [facility] for IV antibiotics and for wound care. Patient says he has a friend who is an Therapist, sports that will assist him at discharge. PICC line has to be placed. .    Expected Discharge Date:  08/09/17              Expected Discharge Plan:  [place]  In-House Referral:  NA  Discharge planning Services  CM Consult  Post Acute Care Choice:  Home Health, Durable Medical Equipment Choice offered to:  Patient  DME Arranged:  IV pump/equipment DME Agency:  [place]:  RN [facility] Agency:  [place]  Status of Service:  In process, will continue to follow  If discussed at Long Length of Stay Meetings, dates discussed:    Additional Comments:  Ninfa Meeker, RN 08/08/2017, 1:56 PM

## 2017-08-08 NOTE — Progress Notes (Signed)
PROGRESS NOTE  Crescencio Jozwiak XAJ:[id number] DOB: [birth year] DOA: 08/05/2017 PCP: [name], MD  HPI/Recap of past 24 hours: Samuel Moran is a 72 y.o. male with a Past Medical History of 1 prior seizure; RA; Parkinson's; and recent onset of afib (CHA2DS2-VASc score 1) who presented today for ostectomy of the first metatarsal head.  TRH was consulted for medical management.  The patient was still somnolent in PACU at the time of my evaluation but reported minimal pain and no other complaints.  Osteomyelitis now s/p ostectomy, on Cipro per podiatry.  Other medical problems appear to be stable at this time.  TRH will medically manage the patient until discharge, likely Friday.  08/06/17: Patient seen and examined at his bedside.  Reports his right big toe pain is well managed on current pain medication.  Has no new complaints.  08/07/2017: Patient seen and examined at his bedside.  He denies any pain in his right foot.  Patient is status post first metatarsal resection and wound debridement POD #2.  He has minimal drainage out of the region.  Patient was on IV ciprofloxacin, will add IV Flagyl this morning.  Infectious disease has been consulted and will see the patient.  08/08/17: No new complaints. Agreeable to PICC line placement.  Assessment/Plan: Principal Problem:   Osteomyelitis of toe of right foot (HCC) Active Problems:   Osteomyelitis of right foot (HCC)   Rheumatoid arthritis, adult (HCC)   Parkinson disease (HCC)   HLD (hyperlipidemia)   Paroxysmal atrial fibrillation (HCC)   Normocytic anemia  Right foot osteomyelitis status post first metatarsal resection and wound debridement POD #2 Self-reported initial injury when he was 72 years old, in the last 3 years he has noted drainage in the area.  No pain. Is been followed by Dr. Jacqualyn Posey. MRI of the right foot done on 07/13/2017 revealed osteomyelitis to the first metatarsal remnant and also fourth and fifth  metatarsal Continue pain management Continue dressing changes ESR 67 CRP 9.1 ID started IV ceftaz and IV vanc (trough 15-20) Start date 08/07/17 end date 09/16/17. F/U w Dr Johnnye Sima in 4 weeks Will discharge once PICC line placed  Hyponatremia, resolved Na= 136 from 134 Asymptomatic Repeat chemistry panel in the morning  Microcytic anemia/acute blood loss anemia Suspect from surgical procedure Hemoglobin 8.1 MCV 79.9 Repeat CBC  Parkinson's disease Continue home medications  Paroxysmal A. fib Not on anticoagulation or beta blocker Appears stable    Code Status: Full code  Family Communication: None at bedside  Disposition Plan: Home when clinically stable   Consultants:  Podiatry  Procedures:  Right ostectomy of complete metatarsal head  Antimicrobials:  None  DVT prophylaxis: SCDs   Objective: Vitals:   08/07/17 1300 08/07/17 2135 08/08/17 0612 08/08/17 1300  BP: 134/75 (!) 158/85 (!) 152/92 (!) 144/81  Pulse: 82 94 76 75  Resp: 18   18  Temp: 98.3 F (36.8 C) 98.4 F (36.9 C) 97.6 F (36.4 C) 97.8 F (36.6 C)  TempSrc: Oral Oral Oral Oral  SpO2: 98% 99% 96% 100%  Weight:      Height:        Intake/Output Summary (Last 24 hours) at 08/08/2017 1531 Last data filed at 08/08/2017 1300 Gross per 24 hour  Intake 1320 ml  Output 2600 ml  Net -1280 ml   Filed Weights   08/05/17 0800  Weight: 97.5 kg (215 lb)    Exam: 08/08/17   General: 72 AAM WD wN NAd A&O  x 3 Cardiovascular: RRR no rubs or gallops. No JVD or thyromegaly.  Respiratory: Clear to auscultation auscultation with no wheezes or rales.  Abdomen: Soft nontender nondistended normal bowel sounds x4  Musculoskeletal: Draining noted at right first toe wound  Skin: As stated above  Psychiatry: Mood is appropriate for condition and setting   Data Reviewed: CBC: Recent Labs  Lab 08/05/17 0803 08/06/17 0603 08/07/17 1250  WBC 8.5 10.6* 8.6  HGB 9.8* 8.1* 9.1*  HCT 30.5*  25.1* 28.2*  MCV 79.4 79.9 79.4  PLT 335 272 161   Basic Metabolic Panel: Recent Labs  Lab 08/05/17 0803 08/05/17 1544 08/06/17 0603 08/07/17 1250  NA 136  --  134* 136  K 3.9  --  3.6 3.6  CL 108  --  104 105  CO2 19*  --  25 20*  GLUCOSE 97  --  110* 131*  BUN 17  --  13 11  CREATININE 0.71  --  0.81 0.76  CALCIUM 8.1*  --  7.7* 8.1*  MG  --  1.9  --   --   PHOS  --  2.4*  --   --    GFR: Estimated Creatinine Clearance: 102.3 mL/min (by C-G formula based on SCr of 0.76 mg/dL). Liver Function Tests: No results for input(s): AST, ALT, ALKPHOS, BILITOT, PROT, ALBUMIN in the last 168 hours. No results for input(s): LIPASE, AMYLASE in the last 168 hours. No results for input(s): AMMONIA in the last 168 hours. Coagulation Profile: No results for input(s): INR, PROTIME in the last 168 hours. Cardiac Enzymes: No results for input(s): CKTOTAL, CKMB, CKMBINDEX, TROPONINI in the last 168 hours. BNP (last 3 results) No results for input(s): PROBNP in the last 8760 hours. HbA1C: No results for input(s): HGBA1C in the last 72 hours. CBG: No results for input(s): GLUCAP in the last 168 hours. Lipid Profile: No results for input(s): CHOL, HDL, LDLCALC, TRIG, CHOLHDL, LDLDIRECT in the last 72 hours. Thyroid Function Tests: No results for input(s): TSH, T4TOTAL, FREET4, T3FREE, THYROIDAB in the last 72 hours. Anemia Panel: Recent Labs    08/05/17 1544  VITAMINB12 202  FOLATE 17.7  FERRITIN 44  TIBC 225*  IRON 28*  RETICCTPCT 1.2   Urine analysis:    Component Value Date/Time   COLORURINE LT. YELLOW 07/26/2010 [place] 07/26/2010 0855   LABSPEC 1.025 07/26/2010 0855   PHURINE 5.5 07/26/2010 0855   GLUCOSEU NEGATIVE 07/26/2010 0855   HGBUR NEGATIVE 07/26/2010 0855   HGBUR negative 06/28/2008 0853   BILIRUBINUR n 10/17/2014 0954   KETONESUR NEGATIVE 07/26/2010 0855   PROTEINUR n 10/17/2014 0954   UROBILINOGEN 0.2 10/17/2014 0954   UROBILINOGEN 0.2  07/26/2010 0855   NITRITE n 10/17/2014 0954   NITRITE POSITIVE 07/26/2010 0855   LEUKOCYTESUR Negative 10/17/2014 0954   Sepsis Labs: @LABRCNTIP (procalcitonin:4,lacticidven:4)  ) Recent Results (from the past 240 hour(s))  Aerobic/Anaerobic Culture (surgical/deep wound)     Status: None (Preliminary result)   Collection Time: 08/05/17 11:41 AM  Result Value Ref Range Status   Specimen Description WOUND FOOT RIGHT  Final   Special Requests SPEC A/FIRST METATARSAL  Final   Gram Stain   Final    RARE WBC PRESENT,BOTH PMN AND MONONUCLEAR NO ORGANISMS SEEN    Culture   Final    NO GROWTH 3 DAYS NO ANAEROBES ISOLATED; CULTURE IN PROGRESS FOR 5 DAYS Performed at [facility], [place] [address], [place], [place] [address]  Report Status PENDING  Incomplete  Aerobic/Anaerobic Culture (surgical/deep wound)     Status: None (Preliminary result)   Collection Time: 08/05/17 12:17 PM  Result Value Ref Range Status   Specimen Description TISSUE FOOT RIGHT  Final   Special Requests SPEC B/FIRST METATARSAL  Final   Gram Stain   Final    FEW WBC PRESENT,BOTH PMN AND MONONUCLEAR NO ORGANISMS SEEN    Culture   Final    RARE STAPHYLOCOCCUS SPECIES (COAGULASE NEGATIVE) NO ANAEROBES ISOLATED; CULTURE IN PROGRESS FOR 5 DAYS CRITICAL RESULT CALLED TO, READ BACK BY AND VERIFIED WITH: RN Vick Frees [id number] MLM Performed at [facility], [place] [address], [place], [place] [address]    Report Status PENDING  Incomplete   Organism ID, Bacteria STAPHYLOCOCCUS SPECIES (COAGULASE NEGATIVE)  Final      Susceptibility   Staphylococcus species (coagulase negative) - MIC*    CIPROFLOXACIN >=8 RESISTANT Resistant     ERYTHROMYCIN >=8 RESISTANT Resistant     GENTAMICIN <=0.5 SENSITIVE Sensitive     OXACILLIN >=4 RESISTANT Resistant     TETRACYCLINE 2 SENSITIVE Sensitive     VANCOMYCIN 1 SENSITIVE Sensitive     TRIMETH/SULFA 80 RESISTANT Resistant     CLINDAMYCIN >=8 RESISTANT Resistant      RIFAMPIN <=0.5 SENSITIVE Sensitive     Inducible Clindamycin NEGATIVE Sensitive     * RARE STAPHYLOCOCCUS SPECIES (COAGULASE NEGATIVE)      Studies: [place] Ekg Site Rite  Result Date: 08/08/2017 If Site Rite image not attached, placement could not be confirmed due to current cardiac rhythm.   Scheduled Meds: . carbidopa-levodopa  1 tablet Oral TID  . Chlorhexidine Gluconate Cloth  6 each Topical Once   And  . Chlorhexidine Gluconate Cloth  6 each Topical Once  . naproxen  500 mg Oral BID WC  . omega-3 acid ethyl esters  1 g Oral BID  . pantoprazole  40 mg Oral Daily  . rotigotine  1 patch Transdermal Daily    Continuous Infusions: . cefTAZidime (FORTAZ)  IV Stopped (08/08/17 0016)  . lactated ringers 50 mL/hr (08/05/17 0817)  . vancomycin Stopped (08/08/17 1231)     LOS: 3 days     Kayleen Memos, MD [facility] Pager [phone]  If 7PM-7AM, please contact night-coverage [email or web address] Password [email or web address] 08/08/2017, 3:31 PM

## 2017-08-08 NOTE — Progress Notes (Signed)
Spoke with Hinton Dyer RN re PICC to be placed tomorrow if possible.

## 2017-08-09 LAB — BASIC METABOLIC PANEL
Anion gap: 11 (ref 5–15)
BUN: 12 mg/dL (ref 6–20)
CO2: 23 mmol/L (ref 22–32)
CREATININE: 0.9 mg/dL (ref 0.61–1.24)
Calcium: 8.8 mg/dL — ABNORMAL LOW (ref 8.9–10.3)
Chloride: 103 mmol/L (ref 101–111)
GFR calc Af Amer: >60 mL/min (ref >60)
GLUCOSE: 88 mg/dL (ref 65–99)
POTASSIUM: 3.7 mmol/L (ref 3.5–5.1)
SODIUM: 137 mmol/L (ref 135–145)

## 2017-08-09 LAB — CBC
HCT: 34.2 % — ABNORMAL LOW (ref 39.0–52.0)
Hemoglobin: 10.8 g/dL — ABNORMAL LOW (ref 13.0–17.0)
MCH: 24.8 pg — AB (ref 26.0–34.0)
MCHC: 31.6 g/dL (ref 30.0–36.0)
MCV: 78.4 fL (ref 78.0–100.0)
PLATELETS: 410 10*3/uL — AB (ref 150–400)
RBC: 4.36 MIL/uL (ref 4.22–5.81)
RDW: 16.1 % — AB (ref 11.5–15.5)
WBC: 7.9 10*3/uL (ref 4.0–10.5)

## 2017-08-09 LAB — VANCOMYCIN, TROUGH
Vancomycin Tr: 15 ug/mL (ref 15–20)
Vancomycin Tr: 14 ug/mL — ABNORMAL LOW (ref 15–20)

## 2017-08-09 MED ORDER — SODIUM CHLORIDE 0.9% FLUSH: 10.0000 mL | INTRAVENOUS | Status: DC | PRN

## 2017-08-09 MED ORDER — VANCOMYCIN HCL 10 G IV SOLR
1250.0000 mg | Freq: Two times a day (BID) | INTRAVENOUS | Status: DC
  Administered 2017-08-10: 1250 mg via INTRAVENOUS
  Filled 2017-08-09 (×3): qty 1250

## 2017-08-09 NOTE — Progress Notes (Addendum)
PHARMACY CONSULT NOTE FOR:  OUTPATIENT  PARENTERAL ANTIBIOTIC THERAPY (OPAT)  Indication: Osteomyelitis of R foot Pseudomonas (in office Cx) pan-sens MRSE op Cx  Regimen: Ceftazidime 2g IV q 8hr and Vancomycin 1250 mg IV q 12 hrs End date: 09/16/17  IV antibiotic discharge orders are pended. To discharging provider:  please sign these orders via discharge navigator,  Select New Orders & click on the button choice - Manage This Unsigned Work.     Thank you for allowing pharmacy to be a part of this patient's care.  Alycia Rossetti, PharmD, BCPS Clinical Pharmacist Pager: [phone] 08/09/2017 5:22 PM

## 2017-08-09 NOTE — Progress Notes (Signed)
Pharmacy Antibiotic Note  Samuel Moran is a 72 y.o. male admitted on 08/05/2017 with R-foot osteomyelitis. Pharmacy has been consulted for Vancomycin + Ceftazidime dosing.  A Vancomycin trough this afternoon resulted as 14 mcg/ml and is close to the desired goal of 15-20 mcg/ml. It is noted that the SCr has been rising slowly 0.71 >> 0.81 >> 0.9. Pharmacy has been asked to switch to q12h dosing for OPAT.   The VT this afternoon and trend in SCr have been evaluated. Given this along with the patient's age - will transition the patient conservatively to 1250 mg/12h due to concern for some accumulation. Pt to get trough follow-up this week at steady state on new dose. Noted plans for discharge today.  Plan: 1. Adjust to Vancomycin 1250 mg IV every 12 hours 2. Continue Ceftazidime 2g IV every 8 hours 3. Will adjust OPAT orders, would recommend a Vancomycin trough at steady state this week 4. Will continue to follow renal function, culture results, LOT, and antibiotic de-escalation plans   Height: 5\' 11"  (180.3 cm) Weight: 215 lb (97.5 kg) IBW/kg (Calculated) : 75.3  Temp (24hrs), Avg:97.8 F (36.6 C), Min:97.6 F (36.4 C), Max:97.9 F (36.6 C)  Recent Labs  Lab 08/05/17 0803 08/06/17 0603 08/07/17 1250 08/09/17 0852 08/09/17 1252 08/09/17 1549  WBC 8.5 10.6* 8.6 7.9  --   --   CREATININE 0.71 0.81 0.76 0.90  --   --   VANCOTROUGH  --   --   --   --  15 14*    Estimated Creatinine Clearance: 91 mL/min (by C-G formula based on SCr of 0.9 mg/dL).    No Known Allergies  Antimicrobials this admission: Cipro 4/3 >> 4/5 Vanc 4/5 >> Ceftazidime 4/5 >>  Dose adjustments this admission: 4/7: VT 14 mcg/ml on 1g/8h - adjust to 1250 mg/12h  Microbiology results: 4/3 R-foot tissue cx >> MRSE  Thank you for allowing pharmacy to be a part of this patient's care.  Alycia Rossetti, PharmD, BCPS Clinical Pharmacist Pager: [phone] 08/09/2017 5:13 PM

## 2017-08-09 NOTE — Progress Notes (Signed)
Peripherally Inserted Central Catheter/Midline Placement  The IV Nurse has discussed with the patient and/or persons authorized to consent for the patient, the purpose of this procedure and the potential benefits and risks involved with this procedure.  The benefits include less needle sticks, lab draws from the catheter, and the patient may be discharged home with the catheter. Risks include, but not limited to, infection, bleeding, blood clot (thrombus formation), and puncture of an artery; nerve damage and irregular heartbeat and possibility to perform a PICC exchange if needed/ordered by physician.  Alternatives to this procedure were also discussed.  Bard Power PICC patient education guide, fact sheet on infection prevention and patient information card has been provided to patient /or left at bedside.    PICC/Midline Placement Documentation  PICC Single Lumen 48/18/56 PICC Right Basilic 36 cm (Active)     PICC Single Lumen 08/09/17 PICC Right Brachial 39 cm 0 cm (Active)  Indication for Insertion or Continuance of Line Home intravenous therapies (PICC only) 08/09/2017 11:39 AM  Exposed Catheter (cm) 0 cm 08/09/2017 11:39 AM  Site Assessment Clean;Dry;Intact 08/09/2017 11:39 AM  Line Status Flushed;Saline locked;Blood return noted 08/09/2017 11:39 AM  Dressing Type Transparent 08/09/2017 11:39 AM  Dressing Status Clean;Dry;Intact;Antimicrobial disc in place 08/09/2017 11:39 AM  Line Care Connections checked and tightened 08/09/2017 11:39 AM  Line Adjustment (NICU/IV Team Only) No 08/09/2017 11:39 AM  Dressing Intervention New dressing 08/09/2017 11:39 AM  Dressing Change Due 08/16/17 08/09/2017 11:39 AM       Rolena Infante 08/09/2017, 11:39 AM

## 2017-08-09 NOTE — Care Management (Addendum)
PICC line was not placed 08/08/17 due to lack of IV team and IR staff.  IV team at bedside at present placing PICC.  Per pharmacy, Vancomycin trough is required to finalize order for outpatient antibiotics.  Vancomycin trough is ordered at 3:30pm prior to 4pm dose.  Bedside RN aware.  Also discussed with pharmacist that 2 drugs Q8 may be too much for this patient to manage and they will explore the possibility of changing Vanc to Q12 after trough is resulted.   Results will not be available until 4:30-5, which will be too late for [facility] to obtain and deliver medication to patient's home.  Therefore, patient will have to be discharged tomorrow.  D/W [facility].  [facility] RN can visit patient tomorrow.  Pt will stay with family member and will require [facility] services at address: [address] [place], [place].  11:35am- Dr. Nevada Crane updated.

## 2017-08-09 NOTE — Progress Notes (Signed)
PROGRESS NOTE  Samuel Moran VQQ:[id number] DOB: [birth year] DOA: 08/05/2017 PCP: [name], MD  HPI/Recap of past 24 hours: Samuel Moran is a 72 y.o. male with a Past Medical History of 1 prior seizure; RA; Parkinson's; and recent onset of afib (CHA2DS2-VASc score 1) who presented today for ostectomy of the first metatarsal head.  TRH was consulted for medical management.  The patient was still somnolent in PACU at the time of my evaluation but reported minimal pain and no other complaints.  Osteomyelitis now s/p ostectomy, on Cipro per podiatry.  Other medical problems appear to be stable at this time.  TRH will medically manage the patient until discharge, likely Friday.  08/06/17: Patient seen and examined at his bedside.  Reports his right big toe pain is well managed on current pain medication.  Has no new complaints.  08/07/2017: Patient seen and examined at his bedside.  He denies any pain in his right foot.  Patient is status post first metatarsal resection and wound debridement POD #2.  He has minimal drainage out of the region.  Patient was on IV ciprofloxacin, will add IV Flagyl this morning.  Infectious disease has been consulted and will see the patient.  08/08/17: No new complaints. Agreeable to PICC line placement.  08/09/17: No complaints. In good spirit. Instructions given by Dr Jacqualyn Posey for wound care.  Assessment/Plan: Principal Problem:   Osteomyelitis of toe of right foot (HCC) Active Problems:   Osteomyelitis of right foot (HCC)   Rheumatoid arthritis, adult (HCC)   Parkinson disease (HCC)   HLD (hyperlipidemia)   Paroxysmal atrial fibrillation (HCC)   Normocytic anemia  Right foot osteomyelitis status post first metatarsal resection and wound debridement POD #3 Self-reported initial injury when he was 72 years old, in the last 3 years he has noted drainage in the area.  No pain. Is been followed by Dr. Jacqualyn Posey. MRI of the right foot done on 07/13/2017  revealed osteomyelitis to the first metatarsal remnant and also fourth and fifth metatarsal Continue pain management Continue dressing changes ESR 67 CRP 9.1 ID started IV ceftaz and IV vanc (trough 15-20) Start date 08/07/17 end date 09/16/17. F/U w Dr Johnnye Sima in 4 weeks PICC line placed today 08/09/17 Need vanc through level Plan discharge Monday morning. Non weighbaring Daily dressing changes  Hyponatremia, resolved Na+ 137 from 136 from 134 Asymptomatic Repeat chemistry panel in the morning  Microcytic anemia/acute blood loss anemia Suspect from surgical procedure Hemoglobin 8.1 MCV 79.9 Repeat CBC  Parkinson's disease Continue home medications  Paroxysmal A. fib Not on anticoagulation or beta blocker Appears stable  Ambulatory dysfunction 2/2 to right foot osteomyelitis PT outpatient RN for wound care    Code Status: Full code  Family Communication: None at bedside  Disposition Plan: Home when clinically stable   Consultants:  Podiatry  Procedures:  Right ostectomy of complete metatarsal head  Antimicrobials:  None  DVT prophylaxis: SCDs   Objective: Vitals:   08/08/17 1300 08/08/17 2211 08/09/17 0526 08/09/17 1300  BP: (!) 144/81 (!) 157/89 (!) 142/81 124/74  Pulse: 75 79 (!) 51 88  Resp: 18 20 16 16   Temp: 97.8 F (36.6 C) 97.9 F (36.6 C) 97.6 F (36.4 C) 97.9 F (36.6 C)  TempSrc: Oral Oral Oral Oral  SpO2: 100% 98% 100% 97%  Weight:      Height:        Intake/Output Summary (Last 24 hours) at 08/09/2017 1457 Last data filed at 08/09/2017 1300  Gross per 24 hour  Intake 720 ml  Output 2300 ml  Net -1580 ml   Filed Weights   08/05/17 0800  Weight: 97.5 kg (215 lb)     Exam: 08/09/17  General: 72 yo CM WD wN NAd A&O x 3  Cardiovascular: RRR no rubs or gallops. No JVD or thyromegaly.  Respiratory: Clear to auscultation auscultation with no wheezes or rales.  Abdomen: Soft nontender nondistended normal bowel sounds  x4  Musculoskeletal: Draining noted at right first toe wound  Skin: As stated above  Psychiatry: Mood is appropriate for condition and setting   Data Reviewed: CBC: Recent Labs  Lab 08/05/17 0803 08/06/17 0603 08/07/17 1250 08/09/17 0852  WBC 8.5 10.6* 8.6 7.9  HGB 9.8* 8.1* 9.1* 10.8*  HCT 30.5* 25.1* 28.2* 34.2*  MCV 79.4 79.9 79.4 78.4  PLT 335 272 291 009*   Basic Metabolic Panel: Recent Labs  Lab 08/05/17 0803 08/05/17 1544 08/06/17 0603 08/07/17 1250 08/09/17 0852  NA 136  --  134* 136 137  K 3.9  --  3.6 3.6 3.7  CL 108  --  104 105 103  CO2 19*  --  25 20* 23  GLUCOSE 97  --  110* 131* 88  BUN 17  --  13 11 12   CREATININE 0.71  --  0.81 0.76 0.90  CALCIUM 8.1*  --  7.7* 8.1* 8.8*  MG  --  1.9  --   --   --   PHOS  --  2.4*  --   --   --    GFR: Estimated Creatinine Clearance: 91 mL/min (by C-G formula based on SCr of 0.9 mg/dL). Liver Function Tests: No results for input(s): AST, ALT, ALKPHOS, BILITOT, PROT, ALBUMIN in the last 168 hours. No results for input(s): LIPASE, AMYLASE in the last 168 hours. No results for input(s): AMMONIA in the last 168 hours. Coagulation Profile: No results for input(s): INR, PROTIME in the last 168 hours. Cardiac Enzymes: No results for input(s): CKTOTAL, CKMB, CKMBINDEX, TROPONINI in the last 168 hours. BNP (last 3 results) No results for input(s): PROBNP in the last 8760 hours. HbA1C: No results for input(s): HGBA1C in the last 72 hours. CBG: No results for input(s): GLUCAP in the last 168 hours. Lipid Profile: No results for input(s): CHOL, HDL, LDLCALC, TRIG, CHOLHDL, LDLDIRECT in the last 72 hours. Thyroid Function Tests: No results for input(s): TSH, T4TOTAL, FREET4, T3FREE, THYROIDAB in the last 72 hours. Anemia Panel: No results for input(s): VITAMINB12, FOLATE, FERRITIN, TIBC, IRON, RETICCTPCT in the last 72 hours. Urine analysis:    Component Value Date/Time   COLORURINE LT. YELLOW 07/26/2010 [place] 07/26/2010 0855   LABSPEC 1.025 07/26/2010 0855   PHURINE 5.5 07/26/2010 0855   GLUCOSEU NEGATIVE 07/26/2010 0855   HGBUR NEGATIVE 07/26/2010 0855   HGBUR negative 06/28/2008 0853   BILIRUBINUR n 10/17/2014 0954   KETONESUR NEGATIVE 07/26/2010 0855   PROTEINUR n 10/17/2014 0954   UROBILINOGEN 0.2 10/17/2014 0954   UROBILINOGEN 0.2 07/26/2010 0855   NITRITE n 10/17/2014 0954   NITRITE POSITIVE 07/26/2010 0855   LEUKOCYTESUR Negative 10/17/2014 0954   Sepsis Labs: @LABRCNTIP (procalcitonin:4,lacticidven:4)  ) Recent Results (from the past 240 hour(s))  Aerobic/Anaerobic Culture (surgical/deep wound)     Status: None (Preliminary result)   Collection Time: 08/05/17 11:41 AM  Result Value Ref Range Status   Specimen Description WOUND FOOT RIGHT  Final   Special Requests SPEC A/FIRST METATARSAL  Final  Gram Stain   Final    RARE WBC PRESENT,BOTH PMN AND MONONUCLEAR NO ORGANISMS SEEN    Culture   Final    NO GROWTH 4 DAYS NO ANAEROBES ISOLATED; CULTURE IN PROGRESS FOR 5 DAYS Performed at [facility], [place] [address], [place], [place] [address]    Report Status PENDING  Incomplete  Aerobic/Anaerobic Culture (surgical/deep wound)     Status: None (Preliminary result)   Collection Time: 08/05/17 12:17 PM  Result Value Ref Range Status   Specimen Description TISSUE FOOT RIGHT  Final   Special Requests SPEC B/FIRST METATARSAL  Final   Gram Stain   Final    FEW WBC PRESENT,BOTH PMN AND MONONUCLEAR NO ORGANISMS SEEN    Culture   Final    RARE STAPHYLOCOCCUS SPECIES (COAGULASE NEGATIVE) NO ANAEROBES ISOLATED; CULTURE IN PROGRESS FOR 5 DAYS CRITICAL RESULT CALLED TO, READ BACK BY AND VERIFIED WITH: RN Vick Frees [id number] [phone] Performed at [facility], [place] [address] [place], [place] [address]    Report Status PENDING  Incomplete   Organism ID, Bacteria STAPHYLOCOCCUS SPECIES (COAGULASE NEGATIVE)  Final      Susceptibility   Staphylococcus  species (coagulase negative) - MIC*    CIPROFLOXACIN >=8 RESISTANT Resistant     ERYTHROMYCIN >=8 RESISTANT Resistant     GENTAMICIN <=0.5 SENSITIVE Sensitive     OXACILLIN >=4 RESISTANT Resistant     TETRACYCLINE 2 SENSITIVE Sensitive     VANCOMYCIN 1 SENSITIVE Sensitive     TRIMETH/SULFA 80 RESISTANT Resistant     CLINDAMYCIN >=8 RESISTANT Resistant     RIFAMPIN <=0.5 SENSITIVE Sensitive     Inducible Clindamycin NEGATIVE Sensitive     * RARE STAPHYLOCOCCUS SPECIES (COAGULASE NEGATIVE)      Studies: No results found.  Scheduled Meds: . carbidopa-levodopa  1 tablet Oral TID  . Chlorhexidine Gluconate Cloth  6 each Topical Once   And  . Chlorhexidine Gluconate Cloth  6 each Topical Once  . naproxen  500 mg Oral BID WC  . omega-3 acid ethyl esters  1 g Oral BID  . pantoprazole  40 mg Oral Daily  . rotigotine  1 patch Transdermal Daily    Continuous Infusions: . cefTAZidime (FORTAZ)  IV Stopped (08/09/17 1000)  . lactated ringers 50 mL/hr (08/05/17 0817)  . vancomycin 1,000 mg (08/09/17 0823)     LOS: 4 days     Kayleen Memos, MD [facility] Pager [phone]  If 7PM-7AM, please contact night-coverage [email or web address] Password [email or web address] 08/09/2017, 2:57 PM

## 2017-08-09 NOTE — Progress Notes (Signed)
Subjective: POD #4 s/p right foot partial 1st metatarsal resection and wound debridement. He got a PICC line this morning. He is eager to go home. Awaiting vancomycin level this afternoon and likely discharge Monday per case management.  Pain is controlled.  He has had no pain to the surgical foot. No fevers, chills, nausea, vomiting. He has no concerns.   Objective: AAO x3, NAD-sitting in the chair with Darco wedge shoe on. Bandage clean, dry, intact to the right foot.  Mild bloody strikethrough on the gauze but not through the ACE.  Packing was removed from the central aspect of the incision. Otherwise the incision is well coapted with sutures intact. Bloody drainage was expressed but there was no purulence.  Swelling appears to be improved.  There is no erythema or increase in warmth to the foot.  There is no fluctuation or crepitation.  There is no malodor. No other open wounds are present.  No pain with calf compression, warmth, erythema  Assessment: POD #4 s/p right partial 1st metatarsal resection, wound excision  Plan: -All treatment options discussed with the patient including all alternatives, risks, complications.  -Dressing was changed today. Continue daily dressing changes.  Upon discharge he will need home nursing for dressing changes.  We will flush the wound with saline, packed with half-inch iodoform packing and apply gauze and Ace bandage.  This was done today. -Continue IV antibiotics as directed per infectious disease.  He will do a 6-week course.  PICC line was placed today. -Continue nonweightbearing -Per case management, likely discharge Monday morning. I will have the nursing staff change the bandage in the morning before discharge. I will see him in the office Thursday morning.   Celesta Gentile, DPM O: [phone] C: [phone]

## 2017-08-09 NOTE — Plan of Care (Signed)
  Problem: Health Behavior/Discharge Planning: Goal: Ability to manage health-related needs will improve Outcome: Progressing   Problem: Clinical Measurements: Goal: Ability to maintain clinical measurements within normal limits will improve Outcome: Progressing Goal: Will remain free from infection Outcome: Progressing Goal: Diagnostic test results will improve Outcome: Progressing Goal: Respiratory complications will improve Outcome: Progressing Goal: Cardiovascular complication will be avoided Outcome: Progressing   Problem: Activity: Goal: Risk for activity intolerance will decrease Outcome: Progressing   Problem: Safety: Goal: Ability to remain free from injury will improve Outcome: Progressing   Problem: Skin Integrity: Goal: Risk for impaired skin integrity will decrease Outcome: Progressing   

## 2017-08-10 ENCOUNTER — Encounter: Payer: Medicare HMO | Admitting: Podiatry

## 2017-08-10 ENCOUNTER — Telehealth: Payer: Self-pay | Admitting: *Deleted

## 2017-08-10 DIAGNOSIS — M86679 Other chronic osteomyelitis, unspecified ankle and foot: Secondary | ICD-10-CM

## 2017-08-10 LAB — AEROBIC/ANAEROBIC CULTURE W GRAM STAIN (SURGICAL/DEEP WOUND)

## 2017-08-10 LAB — AEROBIC/ANAEROBIC CULTURE (SURGICAL/DEEP WOUND): CULTURE: NO GROWTH

## 2017-08-10 MED ORDER — CEFTAZIDIME IV (FOR PTA / DISCHARGE USE ONLY): 2.0000 g | Freq: Three times a day (TID) | INTRAVENOUS | 0 refills | Status: DC

## 2017-08-10 MED ORDER — VANCOMYCIN IV (FOR PTA / DISCHARGE USE ONLY): 1250.0000 mg | Freq: Two times a day (BID) | INTRAVENOUS | 0 refills | Status: AC

## 2017-08-10 NOTE — Discharge Summary (Signed)
Discharge Summary  Samuel Moran JJO:[id number] DOB: 28-Feb-1946  PCP: [name], MD  Admit date: 08/05/2017 Discharge date: 08/10/2017  Time spent: 25 minutes   Recommendations for Outpatient Follow-up:  1. Follow up with wound care 2. Follow up with podiatry 3. Take your medications as prescribed 4. Fall precautions  RECOMMENDATIONS PER ID: Discharge antibiotics: ceftaz, vanco Per pharmacy protocol vanco Aim for Vancomycin trough 15-20 (unless otherwise indicated) Duration: 39 days End Date: Sep 16, 2017  [facility] Care Per Protocol: please  Labs weekly while on IV antibiotics: _x_ CBC with differential __ BMP _x_ CMP _x_ CRP _x_ ESR _x_ Vancomycin trough  _x_ Please pull PIC at completion of IV antibiotics __ Please leave PIC in place until doctor has seen patient or been notified  Fax weekly labs to [phone]  Clinic Follow Up Appt: [place] 4 weeks     Discharge Diagnoses:  Active Hospital Problems   Diagnosis Date Noted  . Osteomyelitis of toe of right foot ([place]) 08/05/2017  . Rheumatoid arthritis, adult ([place]) 08/05/2017  . Parkinson disease ([place]) 08/05/2017  . HLD (hyperlipidemia) 08/05/2017  . Paroxysmal atrial fibrillation ([place]) 08/05/2017  . Normocytic anemia 08/05/2017  . Osteomyelitis of right foot ([place]) 10/13/2016    Resolved Hospital Problems  No resolved problems to display.    Discharge Condition: stable  Diet recommendation: resume previous diet  Vitals:   08/09/17 2135 08/10/17 0553  BP: 140/77 123/64  Pulse: 86 79  Resp: 16 20  Temp: 98.3 F (36.8 C) 98 F (36.7 C)  SpO2: 96% 95%    History of present illness:  Samuel Moran a 72 y.o.malewith a Past Medical History of 1 prior seizure; RA; Parkinson's; and recent onset of afib (CHA2DS2-VASc score 1) who presented today for ostectomy of the first metatarsal head. [facility] was consulted for medical management. The patient was still somnolent in PACU at  the time of my evaluation but reported minimal pain and no other complaints. Osteomyelitis now s/p ostectomy, on Cipro per podiatry. Other medical problems appear to be stable at this time. [facility] will medically manage the patient until discharge, likely Friday.  08/06/17: Patient seen and examined at his bedside.  Reports his right big toe pain is well managed on current pain medication.  Has no new complaints.  08/07/2017: Patient seen and examined at his bedside.  He denies any pain in his right foot.  Patient is status post first metatarsal resection and wound debridement POD #2.  He has minimal drainage out of the region.  Patient was on IV ciprofloxacin, will add IV Flagyl this morning.  Infectious disease has been consulted and will see the patient.  08/08/17: No new complaints. Agreeable to PICC line placement.  08/09/17: No complaints. In good spirit. Instructions given by Dr Jacqualyn Posey for wound care.  08/10/17: PICC line placed in yesterday by IV team. No complications from the procedure. No new complaints this morning.  On the day of discharge the patient was hemodynamically stable. He will need to follow up with podiatry, PCP, wound care, IV administration outpatient.   Hospital Course:  Principal Problem:   Osteomyelitis of toe of right foot ([place]) Active Problems:   Osteomyelitis of right foot (H[facility])   Rheumatoid arthritis, adult (HCC)   Parkinson disease ([place])   HLD (hyperlipidemia)   Paroxysmal atrial fibrillation (H[facility])   Normocytic anemia  Right foot osteomyelitis status post first metatarsal resection and wound debridement POD #4 Self-reported initial injury when he was 72  years old, in the last 3 years he has noted drainage in the area.  No pain. Is been followed by Dr. Jacqualyn Posey. MRI of the right foot done on 07/13/2017 revealed osteomyelitis to the first metatarsal remnant and also fourth and fifth metatarsal Continue pain management Continue dressing changes ESR 67 CRP  9.1 ID started IV ceftaz and IV vanc (trough 15-20) Start date 08/07/17 end date 09/16/17. F/U w Dr Johnnye Sima in 4 weeks PICC line placed today 08/09/17 Need vanc through level Plan discharge Monday morning. Non weighbaring Daily dressing changes VANC TROUGH 08/10/17 IS 15.  Hyponatremia, resolved Na+ 137 from 136 from 134 Asymptomatic Repeat chemistry panel in the morning  Microcytic anemia/acute blood loss anemia Suspect from surgical procedure Hemoglobin 8.1 MCV 79.9 Repeat CBC  Parkinson's disease Continue home medications  Paroxysmal A. fib Not on anticoagulation or beta blocker Appears stable  Ambulatory dysfunction 2/2 to right foot osteomyelitis PT outpatient RN for wound care  RA, well controlled Appears stable   Procedures:  Post first metatarsal resection and wound debridement POD # 4  Consultations:  podiatry  Discharge Exam: BP 123/64 (BP Location: Left Arm)   Pulse 79   Temp 98 F (36.7 C) (Oral)   Resp 20   Ht _0  (1.803 m)   Wt 97.5 kg (215 lb)   SpO2 95%   BMI 29.99 kg/m   General: 72 yo CM WD WN NAd A&O x 3 Cardiovascular: RRR no rubs or gallops. No JVD or thyromegaly present. Respiratory: CTA no wheezes or rales  Discharge Instructions You were cared for by a hospitalist during your hospital stay. If you have any questions about your discharge medications or the care you received while you were in the hospital after you are discharged, you can call the unit and asked to speak with the hospitalist on call if the hospitalist that took care of you is not available. Once you are discharged, your primary care physician will handle any further medical issues. Please note that NO REFILLS for any discharge medications will be authorized once you are discharged, as it is imperative that you return to your primary care physician (or establish a relationship with a primary care physician if you do not have one) for your aftercare needs so that  they can reassess your need for medications and monitor your lab values.  Discharge Instructions    Home infusion instructions [facility] May follow [place] Dosing Protocol; May administer Cathflo as needed to maintain patency of vascular access device.; Flushing of vascular access device: per [facility] Protocol: 0.9% NaCl pre/post medica...   Complete by:  As directed    Instructions:  May follow [place] Dosing Protocol   Instructions:  May administer Cathflo as needed to maintain patency of vascular access device.   Instructions:  Flushing of vascular access device: per [facility] Protocol: 0.9% NaCl pre/post medication administration and prn patency; Heparin 100 u/ml, 22m for implanted ports and Heparin 10u/ml, 568mfor all other central venous catheters.   Instructions:  May follow [facility] Anaphylaxis Protocol for First Dose Administration in the home: 0.9% NaCl at 25-50 ml/hr to maintain IV access for protocol meds. Epinephrine 0.3 ml IV/IM PRN and Benadryl 25-50 IV/IM PRN s/s of anaphylaxis.   Instructions:  [facility] Coordinator (RN) to assist per patient IV care needs in the home PRN.     Allergies as of 08/10/2017   No Known Allergies     Medication List  STOP taking these medications   amoxicillin-clavulanate 875-125 MG tablet Commonly known as:  AUGMENTIN   cephALEXin 500 MG capsule Commonly known as:  KEFLEX   ciprofloxacin 750 MG tablet Commonly known as:  CIPRO   doxycycline 100 MG tablet Commonly known as:  VIBRA-TABS   ibuprofen 800 MG tablet Commonly known as:  ADVIL,MOTRIN   naproxen 500 MG tablet Commonly known as:  NAPROSYN     TAKE these medications   carbidopa-levodopa 25-100 MG tablet Commonly known as:  SINEMET IR Take 1 tablet by mouth 3 (three) times daily.   cefTAZidime IVPB Commonly known as:  FORTAZ Inject 2 g into the vein every 8 (eight) hours. Indication:  Osteomyelitis of R foot, Pseudomonas (in office Cx) pan-sens, MRSE  op Cx Last Day of Therapy:  09/16/17 Labs - Once weekly:  CBC/D and BMP, Labs - Every other week:  ESR and CRP   CENTRUM SILVER PO Take 1 tablet by mouth daily.   Fish Oil 1000 MG Caps Take 1 capsule by mouth daily.   NEUPRO 4 MG/24HR Generic drug:  rotigotine PLACE 1 PATCH ONTO THE SKIN DAILY.   omeprazole 20 MG capsule Commonly known as:  PRILOSEC Take 20 mg by mouth daily.   vancomycin IVPB Inject 1,250 mg into the vein every 12 (twelve) hours. Indication: Osteomyelitis of R foot, Pseudomonas (in office Cx) pan-sens, MRSE op Cx Last Day of Therapy:  09/16/17 Labs - _0 /08/19 1259     No Known Allergies Follow-up Information     Trula Slade, DPM Follow up on 08/13/2017.   Specialty:  Podiatry Contact information: [address] [place] [address] [phone]        [name], MD Follow up in 1 week(s).   Specialty:  Internal Medicine Contact information: [place] [address] [phone]        Campbell Riches, MD Follow up in 4 week(s).   Specialty:  Infectious Diseases Contact information: [place] [address] [phone]            The results of significant diagnostics from this hospitalization (including imaging,  microbiology, ancillary and laboratory) are listed below for reference.    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 Draining wound at the amputation site. Status post debridement of the site 07/23/2016. EXAM: MRI OF THE RIGHT FOREFOOT WITHOUT AND WITH CONTRAST TECHNIQUE: Multiplanar, multisequence MR imaging of the right forefoot was performed before and after the administration of intravenous contrast. CONTRAST:  20 cc MultiHance IV. COMPARISON:  Plain films of the right foot 12/11/2016 and 05/26/2017. MRI right foot 07/14/2016. FINDINGS: Bones/Joint/Cartilage Marrow edema and enhancement are seen throughout the first metatarsal remnant consistent osteomyelitis. There is also marrow edema and enhancement throughout the fourth metatarsal remnant consistent with osteomyelitis. The head and neck of the second, third and fourth metatarsals have been resected. Mild marrow edema and enhancement about the second and third tarsometatarsal joints is seen and likely degenerative or neuropathic. There is also marrow edema and enhancement in the cuboid and proximal 4.2 cm of the fifth metatarsal. Finally, marrow edema and enhancement are seen in the lateral periphery of the cuboid.  Ligaments No obvious tear is seen. Muscles and Tendons No tear is of intrinsic identified. Atrophy of intrinsic musculature the foot is seen. Soft tissues A rim enhancing fluid collection distal to the first metatarsal remnant measures 6.4 cm long by 4.1 cm transverse by 3.6 cm craniocaudal. Fluid tracks proximally along the medial and lateral aspects of the distal first metatarsal remnant. There is also a rim enhancing fluid collection distal and deep to the fourth metatarsal measuring 3.8 cm long by 1.9 cm craniocaudal by 1.3 cm transverse. There is intense subcutaneous edema and enhancement about the foot. IMPRESSION: Findings consistent with osteomyelitis throughout the first metatarsal remnant and majority of the fourth metatarsal remnant. Somewhat milder degree of edema and enhancement in the proximal 4.2 cm of the fifth metatarsal is also worrisome for osteomyelitis. Edema and enhancement about the tarsometatarsal joints is worst at the second third and likely degenerative or neuropathic. Marrow edema and enhancement in the lateral periphery of the cuboid worrisome for osteomyelitis. Large rim enhancing fluid collections distal to the first and fourth metatarsal remnants most consistent with abscesses. Electronically Signed   By: Inge Rise M.D.   On: 07/14/2017 08:56   Dg Foot Complete Right  Result Date: 08/05/2017 CLINICAL DATA:  Postop check EXAM: RIGHT FOOT COMPLETE - 3+ VIEW COMPARISON:  08/03/2017 FINDINGS: Interval further amputation of the first metatarsal with the metatarsal base remaining. Postsurgical changes in the soft tissues of the stump. Dorsal dislocation of the third proximal phalanx relative to the metatarsal head. Prior resection of the fourth metatarsal head. IMPRESSION: Interval further amputation of the first metatarsal with the metatarsal base remaining. Postsurgical changes in the soft tissues of the stump. Electronically Signed   By: Kathreen Devoid   On: 08/05/2017 15:40    Dg Foot Complete Right  Result Date: 08/03/2017 Please see detailed radiograph report in office note.  Dg Foot Complete Right  Result Date: 07/20/2017 Please see detailed radiograph report in office note.  Dg Foot Complete Right  Result Date: 07/15/2017 Please see detailed radiograph report in office note.  [place] Ekg Site Rite  Result Date: 08/08/2017 If Site Rite image not attached, placement could not be confirmed due to current cardiac rhythm.   Microbiology: Recent Results (from the past 240 hour(s))  Aerobic/Anaerobic Culture (surgical/deep wound)     Status:  None   Collection Time: 08/05/17 11:41 AM  Result Value Ref Range Status   Specimen Description WOUND FOOT RIGHT  Final   Special Requests SPEC A/FIRST METATARSAL  Final   Gram Stain   Final    RARE WBC PRESENT,BOTH PMN AND MONONUCLEAR NO ORGANISMS SEEN    Culture   Final    No growth aerobically or anaerobically. Performed at [facility], [place] [address] [place], [place] [address]    Report Status 08/10/2017 FINAL  Final  Aerobic/Anaerobic Culture (surgical/deep wound)     Status: None   Collection Time: 08/05/17 12:17 PM  Result Value Ref Range Status   Specimen Description TISSUE FOOT RIGHT  Final   Special Requests SPEC B/FIRST METATARSAL  Final   Gram Stain   Final    FEW WBC PRESENT,BOTH PMN AND MONONUCLEAR NO ORGANISMS SEEN    Culture   Final    RARE STAPHYLOCOCCUS SPECIES (COAGULASE NEGATIVE) NO ANAEROBES ISOLATED CRITICAL RESULT CALLED TO, READ BACK BY AND VERIFIED WITH: RN Vick Frees [phone] MLM Performed at [facility], [address] [place], [place] [address]    Report Status 08/10/2017 FINAL  Final   Organism ID, Bacteria STAPHYLOCOCCUS SPECIES (COAGULASE NEGATIVE)  Final      Susceptibility   Staphylococcus species (coagulase negative) - MIC*    CIPROFLOXACIN >=8 RESISTANT Resistant     ERYTHROMYCIN >=8 RESISTANT Resistant     GENTAMICIN <=0.5 SENSITIVE Sensitive      OXACILLIN >=4 RESISTANT Resistant     TETRACYCLINE 2 SENSITIVE Sensitive     VANCOMYCIN 1 SENSITIVE Sensitive     TRIMETH/SULFA 80 RESISTANT Resistant     CLINDAMYCIN >=8 RESISTANT Resistant     RIFAMPIN <=0.5 SENSITIVE Sensitive     Inducible Clindamycin NEGATIVE Sensitive     * RARE STAPHYLOCOCCUS SPECIES (COAGULASE NEGATIVE)     Labs: Basic Metabolic Panel: Recent Labs  Lab 08/05/17 0803 08/05/17 1544 08/06/17 0603 08/07/17 1250 08/09/17 0852  NA 136  --  134* 136 137  K 3.9  --  3.6 3.6 3.7  CL 108  --  104 105 103  CO2 19*  --  25 20* 23  GLUCOSE 97  --  110* 131* 88  BUN 17  --  _0 CREATININE 0.71  --  0.81 0.76 0.90  CALCIUM 8.1*  --  7.7* 8.1* 8.8*  MG  --  1.9  --   --   --   PHOS  --  2.4*  --   --   --    Liver Function Tests: No results for input(s): AST, ALT, ALKPHOS, BILITOT, PROT, ALBUMIN in the last 168 hours. No results for input(s): LIPASE, AMYLASE in the last 168 hours. No results for input(s): AMMONIA in the last 168 hours. CBC: Recent Labs  Lab 08/05/17 0803 08/06/17 0603 08/07/17 1250 08/09/17 0852  WBC 8.5 10.6* 8.6 7.9  HGB 9.8* 8.1* 9.1* 10.8*  HCT 30.5* 25.1* 28.2* 34.2*  MCV 79.4 79.9 79.4 78.4  PLT 335 272 291 410*   Cardiac Enzymes: No results for input(s): CKTOTAL, CKMB, CKMBINDEX, TROPONINI in the last 168 hours. BNP: BNP (last 3 results) No results for input(s): BNP in the last 8760 hours.  ProBNP (last 3 results) No results for input(s): PROBNP in the last 8760 hours.  CBG: No results for input(s): GLUCAP in the last 168 hours.     Signed:  Kayleen Memos, MD [facility] 08/10/2017,  1:08 PM

## 2017-08-10 NOTE — Discharge Instructions (Signed)
Bone and Joint Infections, Adult Bone infections (osteomyelitis) and joint infections (septic arthritis) occur when bacteria or other germs get inside a bone or a joint. This can happen if you have an infection in another part of your body that spreads through your blood. Germs from your skin or from outside of your body can also cause this type of infection if you have a wound or a broken bone (fracture) that breaks the skin. Anyone can get a bone infection or joint infection. You may be more likely to get this type of infection if you have a condition, such as diabetes, that lowers your ability to fight infection or increases your chances of getting an infection. Bone and joint infections can cause damage, and they can spread to other areas of your body. They need to be treated quickly. What are the causes? Most bone and joint infections are caused by bacteria. They can also be caused by other germs, such as viruses and funguses. What increases the risk? This condition is more likely to develop in:  People who recently had surgery, especially bone or joint surgery.  People who have a long-term (chronic) disease, such as: ? HIV (human immunodeficiency virus). ? Diabetes. ? Rheumatoid arthritis. ? Sickle cell anemia.  Elderly people.  People who take medicines that block or weaken the bodys defense system (immune system).  People who have a condition that reduces their blood flow.  People who are on kidney dialysis.  People who have an artificial joint.  People who have had a joint or bone repaired with plates or screws (surgical hardware).  People who use or abuse IV drugs.  People who have had trauma, such as stepping on a nail.  What are the signs or symptoms? Symptoms vary depending on the type and location of your infection. Common symptoms of bone and joint infections include:  Fever and chills.  Redness and warmth.  Swelling.  Pain and stiffness.  Drainage of fluid  or pus near the infection.  Weight loss and fatigue.  Decreased ability to use a hand or foot.  How is this diagnosed? This condition may be diagnosed based on symptoms, medical history, a physical exam, and diagnostic tests. Tests can help to identify the cause of the infection. You may have various tests, such as:  A sample of tissue, fluid, or blood taken to be examined under a microscope.  A procedure to remove fluid from the infected joint with a needle (joint aspiration) for testing in a lab.  Pus or discharge swabbed from a wound for testing to identify germs and to determine what type of medicine will kill them (culture and sensitivity).  Blood tests to look for evidence of infection and inflammation (biomarkers).  Imaging studies to determine how severe the bone or joint infection is. These may include: ? X-rays. ? CT scan. ? MRI. ? Bone scan.  How is this treated? Treatment depends on the cause and type of infection. Antibiotic medicines are usually the first treatment for a bone or joint infection. Treatment with antibiotics may include:  Getting IV antibiotics. This may be done in a hospital at first. You may have to continue IV antibiotics at home for several weeks. You may also have to take antibiotics by mouth for several weeks after that.  Taking more than one kind of antibiotic. Treatment may start with a type of antibiotic that works against many different bacteria (broad spectrumantibiotics). IV antibiotics may be changed if tests show that another  type may work better.  Other treatments may include:  Draining fluid from the joint by placing a needle into it (aspiration).  Surgery to remove: ? Dead or dying tissue from a bone or joint. ? An infected artificial joint. ? Infected plates or screws that were used to repair a broken bone.  Follow these instructions at home:  Take medicines only as directed by your health care provider.  Take your antibiotic  medicine as directed by your health care provider. Finish the antibiotic even if you start to feel better.  Follow instructions from your health care provider about how to take IV antibiotics at home.  Ask your health care provider if you have any restrictions on your activities.  Keep all follow-up visits as directed by your health care provider. This is important. Contact a health care provider if:  You have a fever or chills.  You have redness, warmth, pain, or swelling that returns after treatment. Get help right away if:  You have rapid breathing or you have trouble breathing.  You have chest pain.  You cannot drink fluids or make urine.  The affected arm or leg swells, changes color, or turns blue. This information is not intended to replace advice given to you by your health care provider. Make sure you discuss any questions you have with your health care provider. Document Released: 04/21/2005 Document Revised: 09/27/2015 Document Reviewed: 04/19/2014 Elsevier Interactive Patient Education  2018 [place], Adult Taking care of your wound properly can help to prevent pain and infection. It can also help your wound to heal more quickly. How is this treated? Wound care  Follow instructions from your health care provider about how to take care of your wound. Make sure you: ? Wash your hands with soap and water before you change the bandage (dressing). If soap and water are not available, use hand sanitizer. ? Change your dressing as told by your health care provider. ? Leave stitches (sutures), skin glue, or adhesive strips in place. These skin closures may need to stay in place for 2 weeks or longer. If adhesive strip edges start to loosen and curl up, you may trim the loose edges. Do not remove adhesive strips completely unless your health care provider tells you to do that.  Check your wound area every day for signs of infection. Check for: ? More  redness, swelling, or pain. ? More fluid or blood. ? Warmth. ? Pus or a bad smell.  Ask your health care provider if you should clean the wound with mild soap and water. Doing this may include: ? Using a clean towel to pat the wound dry after cleaning it. Do not rub or scrub the wound. ? Applying a cream or ointment. Do this only as told by your health care provider. ? Covering the incision with a clean dressing.  Ask your health care provider when you can leave the wound uncovered. Medicines   If you were prescribed an antibiotic medicine, cream, or ointment, take or use the antibiotic as told by your health care provider. Do not stop taking or using the antibiotic even if your condition improves.  Take over-the-counter and prescription medicines only as told by your health care provider. If you were prescribed pain medicine, take it at least 30 minutes before doing any wound care or as told by your health care provider. General instructions  Return to your normal activities as told by your health care provider. Ask your  health care provider what activities are safe.  Do not scratch or pick at the wound.  Keep all follow-up visits as told by your health care provider. This is important.  Eat a diet that includes protein, vitamin A, vitamin C, and other nutrient-rich foods. These help the wound heal: ? Protein-rich foods include meat, dairy, beans, nuts, and other sources. ? Vitamin A-rich foods include carrots and dark green, leafy vegetables. ? Vitamin C-rich foods include citrus, tomatoes, and other fruits and vegetables. ? Nutrient-rich foods have protein, carbohydrates, fat, vitamins, or minerals. Eat a variety of healthy foods including vegetables, fruits, and whole grains. Contact a health care provider if:  You received a tetanus shot and you have swelling, severe pain, redness, or bleeding at the injection site.  Your pain is not controlled with medicine.  You have more  redness, swelling, or pain around the wound.  You have more fluid or blood coming from the wound.  Your wound feels warm to the touch.  You have pus or a bad smell coming from the wound.  You have a fever or chills.  You are nauseous or you vomit.  You are dizzy. Get help right away if:  You have a red streak going away from your wound.  The edges of the wound open up and separate.  Your wound is bleeding and the bleeding does not stop with gentle pressure.  You have a rash.  You faint.  You have trouble breathing. This information is not intended to replace advice given to you by your health care provider. Make sure you discuss any questions you have with your health care provider. Document Released: 01/29/2008 Document Revised: 12/19/2015 Document Reviewed: 11/06/2015 Elsevier Interactive Patient Education  2017 [employer].

## 2017-08-10 NOTE — Telephone Encounter (Signed)
Pt states he wants Dr. Jacqualyn Posey to rush his discharge from the hospital, he has [facility] coming by to teach about the thing in his arm. I spoke with Dr. 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 I told pt and he said to have Dr. Jacqualyn Posey call him prior to coming to the hospital. I left message for pt's [facility] nurse to call pt on his cellphone he may not be home for PICC line instruction.

## 2017-08-10 NOTE — Progress Notes (Signed)
[facility] will provide [facility] services for RN in the home and Home infusion Pharmacy services for home IV ABX. Teaching with pt and by phone with Fenton Malling, friend who is an Therapist, sports Case Mgr with [employer].  She will administer IV ABX tonight at home- Vancomcyin 8 PM and Ceftazidime at 10 PM.  [facility] to see pt for admission visit tomorrow 8-9 AM at the home. Pt has [facility] contact phone number for any questions.   If patient discharges after hours, please call [phone].   Larry Sierras 08/10/2017, 1:59 PM

## 2017-08-10 NOTE — Care Management Important Message (Signed)
Important Message  Patient Details  Name: Samuel Moran MRN: [id number] Date of Birth: 18-Jul-1945   Medicare Important Message Given:       [name] 08/10/2017, 2:28 PM

## 2017-08-11 DIAGNOSIS — M19071 Primary osteoarthritis, right ankle and foot: Secondary | ICD-10-CM | POA: Diagnosis not present

## 2017-08-11 DIAGNOSIS — E785 Hyperlipidemia, unspecified: Secondary | ICD-10-CM | POA: Diagnosis not present

## 2017-08-11 DIAGNOSIS — G40909 Epilepsy, unspecified, not intractable, without status epilepticus: Secondary | ICD-10-CM | POA: Diagnosis not present

## 2017-08-11 DIAGNOSIS — I480 Paroxysmal atrial fibrillation: Secondary | ICD-10-CM | POA: Diagnosis not present

## 2017-08-11 DIAGNOSIS — G20 Parkinson's disease: Secondary | ICD-10-CM | POA: Diagnosis not present

## 2017-08-11 DIAGNOSIS — D649 Anemia, unspecified: Secondary | ICD-10-CM | POA: Diagnosis not present

## 2017-08-11 DIAGNOSIS — Z4789 Encounter for other orthopedic aftercare: Secondary | ICD-10-CM | POA: Diagnosis not present

## 2017-08-11 DIAGNOSIS — M069 Rheumatoid arthritis, unspecified: Secondary | ICD-10-CM | POA: Diagnosis not present

## 2017-08-11 DIAGNOSIS — M86071 Acute hematogenous osteomyelitis, right ankle and foot: Secondary | ICD-10-CM | POA: Diagnosis not present

## 2017-08-11 DIAGNOSIS — B965 Pseudomonas (aeruginosa) (mallei) (pseudomallei) as the cause of diseases classified elsewhere: Secondary | ICD-10-CM | POA: Diagnosis not present

## 2017-08-12 DIAGNOSIS — M86071 Acute hematogenous osteomyelitis, right ankle and foot: Secondary | ICD-10-CM | POA: Diagnosis not present

## 2017-08-12 DIAGNOSIS — Z4789 Encounter for other orthopedic aftercare: Secondary | ICD-10-CM | POA: Diagnosis not present

## 2017-08-12 DIAGNOSIS — I480 Paroxysmal atrial fibrillation: Secondary | ICD-10-CM | POA: Diagnosis not present

## 2017-08-12 DIAGNOSIS — E785 Hyperlipidemia, unspecified: Secondary | ICD-10-CM | POA: Diagnosis not present

## 2017-08-12 DIAGNOSIS — M069 Rheumatoid arthritis, unspecified: Secondary | ICD-10-CM | POA: Diagnosis not present

## 2017-08-12 DIAGNOSIS — B965 Pseudomonas (aeruginosa) (mallei) (pseudomallei) as the cause of diseases classified elsewhere: Secondary | ICD-10-CM | POA: Diagnosis not present

## 2017-08-12 DIAGNOSIS — D649 Anemia, unspecified: Secondary | ICD-10-CM | POA: Diagnosis not present

## 2017-08-12 DIAGNOSIS — G20 Parkinson's disease: Secondary | ICD-10-CM | POA: Diagnosis not present

## 2017-08-12 DIAGNOSIS — M19071 Primary osteoarthritis, right ankle and foot: Secondary | ICD-10-CM | POA: Diagnosis not present

## 2017-08-12 DIAGNOSIS — M86171 Other acute osteomyelitis, right ankle and foot: Secondary | ICD-10-CM | POA: Diagnosis not present

## 2017-08-12 DIAGNOSIS — G40909 Epilepsy, unspecified, not intractable, without status epilepticus: Secondary | ICD-10-CM | POA: Diagnosis not present

## 2017-08-13 ENCOUNTER — Ambulatory Visit (INDEPENDENT_AMBULATORY_CARE_PROVIDER_SITE_OTHER): Payer: Medicare HMO | Admitting: Podiatry

## 2017-08-13 ENCOUNTER — Telehealth: Payer: Self-pay | Admitting: *Deleted

## 2017-08-13 DIAGNOSIS — M869 Osteomyelitis, unspecified: Secondary | ICD-10-CM

## 2017-08-13 DIAGNOSIS — L02611 Cutaneous abscess of right foot: Secondary | ICD-10-CM

## 2017-08-13 DIAGNOSIS — L97519 Non-pressure chronic ulcer of other part of right foot with unspecified severity: Secondary | ICD-10-CM

## 2017-08-13 NOTE — Telephone Encounter (Signed)
Lyle asked if Dr. Jacqualyn Posey would like them to watch his foot.

## 2017-08-13 NOTE — Progress Notes (Signed)
Subjective: 72 year old male presents the office today for his first postoperative visit status post wound excision, first metatarsal osteotomy on the right side.  He was discharged home from the hospital and is on IV Fortaz as well as vancomycin for a total of 6 weeks.  He states that he is doing well he has had no complications from the infusions.  His friend does continue to change the bandages daily.  She has noticed a new wound forming on the bottom of the foot.  The patient does state that he is try to be nonweightbearing which is possible but if he does put weight to his foot he uses a Darco wedge shoe. Denies any systemic complaints such as fevers, chills, nausea, vomiting. No acute changes since last appointment, and no other complaints at this time.   Objective: AAO x3, NAD DP/PT pulses palpable bilaterally, CRT less than 3 seconds On the right that the incision appears to be well coapted.  Along the proximal aspect of the incision there is the opening where there is been packing.  There is also a new open sore that has progressed since I last saw him in the hospital this is more on the plantar aspect and this is just distal to the incision.  Is able to remove the packing serosanguineous drainage was expressed but there is no purulence.  There is a mild increase in swelling compared to when I saw him in the hospital but he is also been more active.  There is no increase in warmth and there is no area of fluctuation or crepitation.  The incision appears to be healing well overall. No open lesions or pre-ulcerative lesions.  No pain with calf compression, swelling, warmth, erythema  Assessment: Osteomyelitis right status post partial first metatarsal ostectomy, debridement  Plan: -All treatment options discussed with the patient including all alternatives, risks, complications.  -Today the dressing was changed.  I was only able able to identify a small amount of serosanguineous drainage.   There is no purulence.  The area was flushed.  Iodoform was packed into the wound followed by a small amount of Betadine to the plantar new ulceration.  This was followed by dry sterile dressing.  I want him to continue with daily dressing changes.  Also continue IV antibiotics as directed by infectious disease.  Stressed nonweightbearing. -RTC 1 week or sooner if needed -Patient encouraged to call the office with any questions, concerns, change in symptoms.   *x-ray right foot next appointment  Trula Slade DPM

## 2017-08-13 NOTE — Telephone Encounter (Signed)
It is flush the wound with saline, pack with iodoform packing to the wounds. Apply a small amount of betadine around the incision then cover with gauze.   Also, can you have them also fax his weekly labs to me (and also Dr. Johnnye Sima with ID who already gets them)? Thanks.

## 2017-08-14 ENCOUNTER — Other Ambulatory Visit: Payer: Self-pay | Admitting: Pharmacist

## 2017-08-14 NOTE — Telephone Encounter (Signed)
I informed [facility] I had orders for pt wound care, she states can fax to [phone]. Orders faxed to [facility].

## 2017-08-16 DIAGNOSIS — G40909 Epilepsy, unspecified, not intractable, without status epilepticus: Secondary | ICD-10-CM | POA: Diagnosis not present

## 2017-08-16 DIAGNOSIS — M86071 Acute hematogenous osteomyelitis, right ankle and foot: Secondary | ICD-10-CM | POA: Diagnosis not present

## 2017-08-16 DIAGNOSIS — G20 Parkinson's disease: Secondary | ICD-10-CM | POA: Diagnosis not present

## 2017-08-16 DIAGNOSIS — E785 Hyperlipidemia, unspecified: Secondary | ICD-10-CM | POA: Diagnosis not present

## 2017-08-16 DIAGNOSIS — M869 Osteomyelitis, unspecified: Secondary | ICD-10-CM | POA: Diagnosis not present

## 2017-08-16 DIAGNOSIS — Z4789 Encounter for other orthopedic aftercare: Secondary | ICD-10-CM | POA: Diagnosis not present

## 2017-08-16 DIAGNOSIS — D649 Anemia, unspecified: Secondary | ICD-10-CM | POA: Diagnosis not present

## 2017-08-16 DIAGNOSIS — M069 Rheumatoid arthritis, unspecified: Secondary | ICD-10-CM | POA: Diagnosis not present

## 2017-08-16 DIAGNOSIS — I480 Paroxysmal atrial fibrillation: Secondary | ICD-10-CM | POA: Diagnosis not present

## 2017-08-16 DIAGNOSIS — M19071 Primary osteoarthritis, right ankle and foot: Secondary | ICD-10-CM | POA: Diagnosis not present

## 2017-08-16 DIAGNOSIS — B965 Pseudomonas (aeruginosa) (mallei) (pseudomallei) as the cause of diseases classified elsewhere: Secondary | ICD-10-CM | POA: Diagnosis not present

## 2017-08-16 NOTE — Progress Notes (Signed)
Patient still in hospital.

## 2017-08-17 ENCOUNTER — Encounter: Payer: Medicare HMO | Admitting: Podiatry

## 2017-08-18 ENCOUNTER — Ambulatory Visit (INDEPENDENT_AMBULATORY_CARE_PROVIDER_SITE_OTHER): Payer: Medicare HMO | Admitting: Podiatry

## 2017-08-18 ENCOUNTER — Encounter: Payer: Self-pay | Admitting: Podiatry

## 2017-08-18 ENCOUNTER — Ambulatory Visit (INDEPENDENT_AMBULATORY_CARE_PROVIDER_SITE_OTHER): Payer: Medicare HMO

## 2017-08-18 DIAGNOSIS — L97519 Non-pressure chronic ulcer of other part of right foot with unspecified severity: Secondary | ICD-10-CM | POA: Diagnosis not present

## 2017-08-18 DIAGNOSIS — M869 Osteomyelitis, unspecified: Secondary | ICD-10-CM

## 2017-08-18 DIAGNOSIS — L02611 Cutaneous abscess of right foot: Secondary | ICD-10-CM

## 2017-08-18 MED ORDER — TRAMADOL HCL 50 MG PO TABS: 50.0000 mg | ORAL_TABLET | Freq: Two times a day (BID) | ORAL | 0 refills | Status: DC | PRN

## 2017-08-18 NOTE — Progress Notes (Signed)
Subjective: 72 year old male presents the office today for his first postoperative visit status post wound excision, first metatarsal osteotomy on the right side. He is still on IV Frotaz and vacomycin. He does report that he has been getting some sharp pain to the area.  He is asking for some pain medicine to help with this when this happened.  His friend is been changing the bandages daily.  He is try to be nonweightbearing as much as possible.  He presents today walking in a Darco shoe. Denies any systemic complaints such as fevers, chills, nausea, vomiting. No acute changes since last appointment, and no other complaints at this time.   Objective: AAO x3, NAD DP/PT pulses palpable bilaterally, CRT less than 3 seconds On the right that the incision appears to be well coapted.  On the superior portion of the incision there is the opening of the packing is present with mild macerated tissue.  The wound on the bottom of the foot that was new last appointment appears to be much improved and there is no probing that area.  The wound along the superior portion incision still probes and only a small amount of serosanguineous drainage was expressed but there is no purulence.  There is decrease edema to the foot compared to last appointment there is no erythema or increase in warmth identified.  There is no fluctuation or crepitation.  There is no malodor. No open lesions or pre-ulcerative lesions.  No pain with calf compression, swelling, warmth, erythema  Assessment: Osteomyelitis right status post partial first metatarsal ostectomy, debridement  Plan: -All treatment options discussed with the patient including all alternatives, risks, complications.  -X-rays were obtained and reviewed with the patient.  Resection margins the first metatarsal.  Without any definitive evidence of acute osteomyelitis on x-ray finding.  There is no soft tissue emphysema present. -Dressing was changed today.  The area was  flushed and iodoform packing was applied.  Discussed Betadine along the incision as well as the plantar wound which appears to be improving as well. -Continue to encourage nonweightbearing -Continue antibiotics per infectious disease. -Follow-up in 1 week for wound check or sooner if needed.  Call any questions or concerns.  Trula Slade DPM

## 2017-08-20 ENCOUNTER — Other Ambulatory Visit: Payer: Self-pay | Admitting: Pharmacist

## 2017-08-20 DIAGNOSIS — B965 Pseudomonas (aeruginosa) (mallei) (pseudomallei) as the cause of diseases classified elsewhere: Secondary | ICD-10-CM | POA: Diagnosis not present

## 2017-08-20 DIAGNOSIS — Z4789 Encounter for other orthopedic aftercare: Secondary | ICD-10-CM | POA: Diagnosis not present

## 2017-08-20 DIAGNOSIS — G40909 Epilepsy, unspecified, not intractable, without status epilepticus: Secondary | ICD-10-CM | POA: Diagnosis not present

## 2017-08-20 DIAGNOSIS — M19071 Primary osteoarthritis, right ankle and foot: Secondary | ICD-10-CM | POA: Diagnosis not present

## 2017-08-20 DIAGNOSIS — D649 Anemia, unspecified: Secondary | ICD-10-CM | POA: Diagnosis not present

## 2017-08-20 DIAGNOSIS — G20 Parkinson's disease: Secondary | ICD-10-CM | POA: Diagnosis not present

## 2017-08-20 DIAGNOSIS — M86171 Other acute osteomyelitis, right ankle and foot: Secondary | ICD-10-CM | POA: Diagnosis not present

## 2017-08-20 DIAGNOSIS — E785 Hyperlipidemia, unspecified: Secondary | ICD-10-CM | POA: Diagnosis not present

## 2017-08-20 DIAGNOSIS — I480 Paroxysmal atrial fibrillation: Secondary | ICD-10-CM | POA: Diagnosis not present

## 2017-08-20 DIAGNOSIS — M069 Rheumatoid arthritis, unspecified: Secondary | ICD-10-CM | POA: Diagnosis not present

## 2017-08-20 DIAGNOSIS — M86071 Acute hematogenous osteomyelitis, right ankle and foot: Secondary | ICD-10-CM | POA: Diagnosis not present

## 2017-08-24 ENCOUNTER — Encounter: Payer: Self-pay | Admitting: Infectious Diseases

## 2017-08-24 DIAGNOSIS — M069 Rheumatoid arthritis, unspecified: Secondary | ICD-10-CM | POA: Diagnosis not present

## 2017-08-24 DIAGNOSIS — E785 Hyperlipidemia, unspecified: Secondary | ICD-10-CM | POA: Diagnosis not present

## 2017-08-24 DIAGNOSIS — G40909 Epilepsy, unspecified, not intractable, without status epilepticus: Secondary | ICD-10-CM | POA: Diagnosis not present

## 2017-08-24 DIAGNOSIS — D649 Anemia, unspecified: Secondary | ICD-10-CM | POA: Diagnosis not present

## 2017-08-24 DIAGNOSIS — M86071 Acute hematogenous osteomyelitis, right ankle and foot: Secondary | ICD-10-CM | POA: Diagnosis not present

## 2017-08-24 DIAGNOSIS — M19071 Primary osteoarthritis, right ankle and foot: Secondary | ICD-10-CM | POA: Diagnosis not present

## 2017-08-24 DIAGNOSIS — I480 Paroxysmal atrial fibrillation: Secondary | ICD-10-CM | POA: Diagnosis not present

## 2017-08-24 DIAGNOSIS — B965 Pseudomonas (aeruginosa) (mallei) (pseudomallei) as the cause of diseases classified elsewhere: Secondary | ICD-10-CM | POA: Diagnosis not present

## 2017-08-24 DIAGNOSIS — Z4789 Encounter for other orthopedic aftercare: Secondary | ICD-10-CM | POA: Diagnosis not present

## 2017-08-24 DIAGNOSIS — G20 Parkinson's disease: Secondary | ICD-10-CM | POA: Diagnosis not present

## 2017-08-25 ENCOUNTER — Encounter: Payer: Self-pay | Admitting: Podiatry

## 2017-08-25 ENCOUNTER — Ambulatory Visit (INDEPENDENT_AMBULATORY_CARE_PROVIDER_SITE_OTHER): Payer: Medicare HMO | Admitting: Podiatry

## 2017-08-25 ENCOUNTER — Encounter: Payer: Self-pay | Admitting: Infectious Diseases

## 2017-08-25 VITALS — Temp 96.9°F

## 2017-08-25 DIAGNOSIS — L97519 Non-pressure chronic ulcer of other part of right foot with unspecified severity: Secondary | ICD-10-CM

## 2017-08-25 DIAGNOSIS — Z7689 Persons encountering health services in other specified circumstances: Secondary | ICD-10-CM | POA: Diagnosis not present

## 2017-08-25 DIAGNOSIS — M869 Osteomyelitis, unspecified: Secondary | ICD-10-CM

## 2017-08-26 ENCOUNTER — Ambulatory Visit (INDEPENDENT_AMBULATORY_CARE_PROVIDER_SITE_OTHER): Payer: Medicare HMO | Admitting: Internal Medicine

## 2017-08-26 ENCOUNTER — Encounter: Payer: Self-pay | Admitting: Internal Medicine

## 2017-08-26 VITALS — BP 128/70 | HR 94 | Temp 98.3°F | Wt 215.0 lb

## 2017-08-26 DIAGNOSIS — I480 Paroxysmal atrial fibrillation: Secondary | ICD-10-CM | POA: Diagnosis not present

## 2017-08-26 DIAGNOSIS — M869 Osteomyelitis, unspecified: Secondary | ICD-10-CM | POA: Diagnosis not present

## 2017-08-26 DIAGNOSIS — R69 Illness, unspecified: Secondary | ICD-10-CM | POA: Diagnosis not present

## 2017-08-26 DIAGNOSIS — E785 Hyperlipidemia, unspecified: Secondary | ICD-10-CM | POA: Diagnosis not present

## 2017-08-26 NOTE — Progress Notes (Signed)
Subjective: 72 year old male presents the office today for his first postoperative visit status post wound excision, first metatarsal osteotomy on the right side.  He is continue with IV antibiotics.  He did have to change the dose of the vancomycin at this level was low.  He said no side effects.  He did report some night sweats previously but this is resolved.  He denies any fevers, chills, nausea, vomiting.  Denies any calf pain, chest pain, shortness of breath.  He is continue to walk in the Darco wedge shoe.  He has no other concerns today.  Objective: AAO x3, NAD DP/PT pulses palpable bilaterally, CRT less than 3 seconds On the right that the incision appears to be well coapted.  Towards the superior aspect of the incision is the opening of the wound packed open.  There is some macerated tissue around this area although mild.  The packing was removed and only a small amount of clear drainage is expressed there is no purulence.  There is mild swelling to the foot but there is no significant erythema there is no ascending cellulitis or increased warmth.  The remainder of the incision appears to be healing well.  The wound does probe approximately 2.5 cm but does not probe to bone.  There is no areas of fluctuation or crepitation.  There is no malodor. No open lesions or pre-ulcerative lesions.  No pain with calf compression, swelling, warmth, erythema  Assessment: Osteomyelitis right status post partial first metatarsal ostectomy, debridement  Plan: -All treatment options discussed with the patient including all alternatives, risks, complications.  -The wound was irrigated.  Iodoform packing was then reapplied followed by a small amount of Betadine on the incision and the macerated tissue followed by dry sterile dressing and Ace bandage for compression. -Continue IV antibiotics per ID -Continue daily dressing changes.  His friend does continue to do the dressing changes. -Continue to urge  NWB -Monitor for any clinical signs or symptoms of infection and directed to call the office immediately should any occur or go to the ER. -RTC 1 week or sooner if needed.  Trula Slade DPM

## 2017-08-26 NOTE — Progress Notes (Signed)
Subjective:    Patient ID: Samuel Moran, male    DOB: 08/31/1945, 72 y.o.   MRN: [id number]  HPI  72 year old patient who is seen today in follow-up.  He was seen here 1 month ago with suspected atrial fibrillation.  He has been seen by podiatry earlier but noted an irregular pulse and a EKG reportedly revealed atrial fibrillation with controlled ventricular response.  EKGs over the years have revealed frequent PACs.  He was hospitalized for 5 days from April 3 through April 8 for treatment of osteomyelitis involving the right foot.  Apparently his rhythm was stable during the hospital..  He presently has a PICC line and is completing dual antibiotic therapy and followed by ID. He did have a 2D echocardiogram performed and reportedly he was in atrial fibrillation at that time.  This revealed normal LV function but moderate LVH and increased left atrial size Blood pressures continue to be labile generally in the 140/80 range Blood pressures often are well into a normal range.  Due to his right foot osteomyelitis his activities have been curtailed he does complain of some increasing fatigue and deconditioning anorexia and modest weight loss   Past Medical History:  Diagnosis Date  . Atrial fibrillation ([place])    onset 07/17/17  . Dysrhythmia   . ED (erectile dysfunction)   . GERD 07/01/2007  . Parkinson's disease ([place])   . 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 PROSTATE SPECIFIC ANTIGEN, ELEVATED 06/29/2007  . Rheumatoid arthritis ([place])   . SEIZURE DISORDER 07/01/2007   one epsiode only  . Spinal stenosis of lumbar region 09/22/2014     Social History   Socioeconomic History  . Marital status: Single    Spouse name: Not on file  . Number of children: Not on file  . Years of education: Not on file  . Highest education level: Not on file  Occupational History  . Occupation: Chartered certified accountant  Social Needs  . Financial resource strain: Not on file  . Food insecurity:    Worry: Not on  file    Inability: Not on file  . Transportation needs:    Medical: Not on file    Non-medical: Not on file  Tobacco Use  . Smoking status: Light Tobacco Smoker    Types: Cigars  . Smokeless tobacco: Never Used  . Tobacco comment: 1 cigar a week  Substance and Sexual Activity  . Alcohol use: Yes    Alcohol/week: 2.0 oz    Types: 4 Standard drinks or equivalent per week    Comment: one drink a day (liquor)  . Drug use: No  . Sexual activity: Not on file  Lifestyle  . Physical activity:    Days per week: Not on file    Minutes per session: Not on file  . Stress: Not on file  Relationships  . Social connections:    Talks on phone: Not on file    Gets together: Not on file    Attends religious service: Not on file    Active member of club or organization: Not on file    Attends meetings of clubs or organizations: Not on file    Relationship status: Not on file  . Intimate partner violence:    Fear of current or ex partner: Not on file    Emotionally abused: Not on file    Physically abused: Not on file    Forced sexual activity: Not on file  Other Topics Concern  . Not on  file  Social History Narrative  . Not on file    Past Surgical History:  Procedure Laterality Date  . AMPUTATION     right great toe - tramatic age 66  . COLONOSCOPY    . DEBRIDEMENT TOE Right 07/2016  . HAND SURGERY  03/2017   removal cysts  . IR FLUORO GUIDE CV LINE RIGHT  10/16/2016  . IR US GUIDE VASC ACCESS RIGHT  10/16/2016  . OSTECTOMY Right 08/05/2017   Procedure: OSTECTOMY COMPLETE METARSAL HEAD FIRST RIGHT;  Surgeon: Trula Slade, DPM;  Location: [facility];  Service: Podiatry;  Laterality: Right;    Family History  Problem Relation Age of Onset  . Arthritis Mother   . Alcoholism Mother   . Arthritis Father        died after fall/trauma and had TBI  . Colon cancer Neg Hx   . Esophageal cancer Neg Hx   . Rectal cancer Neg Hx   . Stomach cancer Neg Hx     No Known  Allergies  Current Outpatient Medications on File Prior to Visit  Medication Sig Dispense Refill  . carbidopa-levodopa (SINEMET IR) 25-100 MG tablet Take 1 tablet by mouth 3 (three) times daily. 270 tablet 1  . Multiple Vitamins-Minerals (CENTRUM SILVER PO) Take 1 tablet by mouth daily.     Marland Kitchen NEUPRO 4 MG/24HR PLACE 1 PATCH ONTO THE SKIN DAILY. 30 patch 5  . vancomycin (VANCOCIN) 10 G SOLR injection     . vancomycin IVPB Inject 1,250 mg into the vein every 12 (twelve) hours. Indication: Osteomyelitis of R foot, Pseudomonas (in office Cx) pan-sens, MRSE op Cx Last Day of Therapy:  09/16/17 Labs - Sunday/Monday:  CBC/D, BMP, and vancomycin trough. Labs - Thursday:  BMP and vancomycin trough Labs - Every other week:  ESR and CRP 60 Units 0   No current facility-administered medications on file prior to visit.     BP 128/70 (BP Location: Left Arm, Patient Position: Sitting, Cuff Size: Large)   Pulse 94   Temp 98.3 F (36.8 C) (Oral)   Wt 215 lb (97.5 kg)   SpO2 98%   BMI 29.99 kg/m     Review of Systems  Constitutional: Positive for activity change, appetite change, fatigue and unexpected weight change. Negative for chills and fever.  HENT: Negative for congestion, dental problem, ear pain, hearing loss, sore throat, tinnitus, trouble swallowing and voice change.   Eyes: Negative for pain, discharge and visual disturbance.  Respiratory: Negative for cough, chest tightness, wheezing and stridor.   Cardiovascular: Negative for chest pain, palpitations and leg swelling.  Gastrointestinal: Negative for abdominal distention, abdominal pain, blood in stool, constipation, diarrhea, nausea and vomiting.  Genitourinary: Negative for difficulty urinating, discharge, flank pain, genital sores, hematuria and urgency.  Musculoskeletal: Negative for arthralgias, back pain, gait problem, joint swelling, myalgias and neck stiffness.  Skin: Negative for rash.  Neurological: Negative for dizziness,  syncope, speech difficulty, weakness, numbness and headaches.  Hematological: Negative for adenopathy. Does not bruise/bleed easily.  Psychiatric/Behavioral: Negative for behavioral problems and dysphoric mood. The patient is not nervous/anxious.        Objective:   Physical Exam  Constitutional: He is oriented to person, place, and time. He appears well-developed. No distress.  Afebrile Blood pressure 128/70  HENT:  Head: Normocephalic.  Right Ear: External ear normal.  Left Ear: External ear normal.  Eyes: Conjunctivae and EOM are normal.  Neck: Normal range of motion.  Cardiovascular: Normal rate and normal  heart sounds.  Irregular rhythm with a controlled ventricular response  Pulmonary/Chest: Breath sounds normal.  Abdominal: Bowel sounds are normal.  Musculoskeletal: Normal range of motion. He exhibits no edema or tenderness.  Neurological: He is alert and oriented to person, place, and time.  Skin:  PICC line right arm Right foot bandaged  Psychiatric: He has a normal mood and affect. His behavior is normal.          Assessment & Plan:   Probable PAF.  I do not have a hard copy of any EKG confirming atrial fibrillation.  He has a long history of frequent PACs.  At the time of his 2D echocardiogram, he reportedly was in atrial fibrillation per Cardiology  at that time.  Unclear whether this was a clinical impression based on irregular pulse or whether this was confirmed by EKG.  Probably a moot point, with CHADSVAS score of 1 (age).  We will continue to observe. Hypertensive suspect.  Blood pressure readings have been a bit labile;  we will continue to observe at this point. DASH Eating plan recommended. Moderate LVH.  Low threshold for treating hypertension in view of LVH Osteomyelitis of the right foot.  Follow-up ID PD  follow-up neurology RA follow-up rheumatology  Continue home blood pressure monitoring Reassess blood pressure readings in 3  months  [id number],PETER Pilar Plate

## 2017-08-26 NOTE — Patient Instructions (Addendum)
Limit your sodium (Salt) intake   DASH Eating Plan DASH stands for "Dietary Approaches to Stop Hypertension." The DASH eating plan is a healthy eating plan that has been shown to reduce high blood pressure (hypertension). It may also reduce your risk for type 2 diabetes, heart disease, and stroke. The DASH eating plan may also help with weight loss. What are tips for following this plan? General guidelines  Avoid eating more than 2,300 mg (milligrams) of salt (sodium) a day. If you have hypertension, you may need to reduce your sodium intake to 1,500 mg a day.  Limit alcohol intake to no more than 1 drink a day for nonpregnant women and 2 drinks a day for men. One drink equals 12 oz of beer, 5 oz of wine, or 1 oz of hard liquor.  Work with your health care provider to maintain a healthy body weight or to lose weight. Ask what an ideal weight is for you.  Get at least 30 minutes of exercise that causes your heart to beat faster (aerobic exercise) most days of the week. Activities may include walking, swimming, or biking.  Work with your health care provider or diet and nutrition specialist (dietitian) to adjust your eating plan to your individual calorie needs. Reading food labels  Check food labels for the amount of sodium per serving. Choose foods with less than 5 percent of the Daily Value of sodium. Generally, foods with less than 300 mg of sodium per serving fit into this eating plan.  To find whole grains, look for the word "whole" as the first word in the ingredient list. Shopping  Buy products labeled as "low-sodium" or "no salt added."  Buy fresh foods. Avoid canned foods and premade or frozen meals. Cooking  Avoid adding salt when cooking. Use salt-free seasonings or herbs instead of table salt or sea salt. Check with your health care provider or pharmacist before using salt substitutes.  Do not fry foods. Cook foods using healthy methods such as baking, boiling, grilling,  and broiling instead.  Cook with heart-healthy oils, such as olive, canola, soybean, or sunflower oil. Meal planning   Eat a balanced diet that includes: ? 5 or more servings of fruits and vegetables each day. At each meal, try to fill half of your plate with fruits and vegetables. ? Up to 6-8 servings of whole grains each day. ? Less than 6 oz of lean meat, poultry, or fish each day. A 3-oz serving of meat is about the same size as a deck of cards. One egg equals 1 oz. ? 2 servings of low-fat dairy each day. ? A serving of nuts, seeds, or beans 5 times each week. ? Heart-healthy fats. Healthy fats called Omega-3 fatty acids are found in foods such as flaxseeds and coldwater fish, like sardines, salmon, and mackerel.  Limit how much you eat of the following: ? Canned or prepackaged foods. ? Food that is high in trans fat, such as fried foods. ? Food that is high in saturated fat, such as fatty meat. ? Sweets, desserts, sugary drinks, and other foods with added sugar. ? Full-fat dairy products.  Do not salt foods before eating.  Try to eat at least 2 vegetarian meals each week.  Eat more home-cooked food and less restaurant, buffet, and fast food.  When eating at a restaurant, ask that your food be prepared with less salt or no salt, if possible. What foods are recommended? The items listed may not be a complete  list. Talk with your dietitian about what dietary choices are best for you. Grains Whole-grain or whole-wheat bread. Whole-grain or whole-wheat pasta. Brown rice. [place]row. Bulgur. Whole-grain and low-sodium cereals. Pita bread. Low-fat, low-sodium crackers. Whole-wheat flour tortillas. Vegetables Fresh or frozen vegetables (raw, steamed, roasted, or grilled). Low-sodium or reduced-sodium tomato and vegetable juice. Low-sodium or reduced-sodium tomato sauce and tomato paste. Low-sodium or reduced-sodium canned vegetables. Fruits All fresh, dried, or frozen fruit.  Canned fruit in natural juice (without added sugar). Meat and other protein foods Skinless chicken or [place]. Ground chicken or [place]. Pork with fat trimmed off. Fish and seafood. Egg whites. Dried beans, peas, or lentils. Unsalted nuts, nut butters, and seeds. Unsalted canned beans. Lean cuts of beef with fat trimmed off. Low-sodium, lean deli meat. Dairy Low-fat (1%) or fat-free (skim) milk. Fat-free, low-fat, or reduced-fat cheeses. Nonfat, low-sodium ricotta or cottage cheese. Low-fat or nonfat yogurt. Low-fat, low-sodium cheese. Fats and oils Soft margarine without trans fats. Vegetable oil. Low-fat, reduced-fat, or light mayonnaise and salad dressings (reduced-sodium). Canola, safflower, olive, soybean, and sunflower oils. Avocado. Seasoning and other foods Herbs. Spices. Seasoning mixes without salt. Unsalted popcorn and pretzels. Fat-free sweets. What foods are not recommended? The items listed may not be a complete list. Talk with your dietitian about what dietary choices are best for you. Grains Baked goods made with fat, such as croissants, muffins, or some breads. Dry pasta or rice meal packs. Vegetables Creamed or fried vegetables. Vegetables in a cheese sauce. Regular canned vegetables (not low-sodium or reduced-sodium). Regular canned tomato sauce and paste (not low-sodium or reduced-sodium). Regular tomato and vegetable juice (not low-sodium or reduced-sodium). Angie Fava. Olives. Fruits Canned fruit in a light or heavy syrup. Fried fruit. Fruit in cream or butter sauce. Meat and other protein foods Fatty cuts of meat. Ribs. Fried meat. Berniece Salines. Sausage. Bologna and other processed lunch meats. Salami. Fatback. Hotdogs. Bratwurst. Salted nuts and seeds. Canned beans with added salt. Canned or smoked fish. Whole eggs or egg yolks. Chicken or [place] with skin. Dairy Whole or 2% milk, cream, and half-and-half. Whole or full-fat cream cheese. Whole-fat or sweetened yogurt. Full-fat  cheese. Nondairy creamers. Whipped toppings. Processed cheese and cheese spreads. Fats and oils Butter. Stick margarine. Lard. Shortening. Ghee. Bacon fat. Tropical oils, such as coconut, palm kernel, or palm oil. Seasoning and other foods Salted popcorn and pretzels. Onion salt, garlic salt, seasoned salt, table salt, and sea salt. Worcestershire sauce. Tartar sauce. Barbecue sauce. Teriyaki sauce. Soy sauce, including reduced-sodium. Steak sauce. Canned and packaged gravies. Fish sauce. Oyster sauce. Cocktail sauce. Horseradish that you find on the shelf. Ketchup. Mustard. Meat flavorings and tenderizers. Bouillon cubes. Hot sauce and Tabasco sauce. Premade or packaged marinades. Premade or packaged taco seasonings. Relishes. Regular salad dressings. Where to find more information:  National Heart, Lung, and [place]: https://wilson-eaton.com/  American Heart Association: www.heart.org Summary  The DASH eating plan is a healthy eating plan that has been shown to reduce high blood pressure (hypertension). It may also reduce your risk for type 2 diabetes, heart disease, and stroke.  With the DASH eating plan, you should limit salt (sodium) intake to 2,300 mg a day. If you have hypertension, you may need to reduce your sodium intake to 1,500 mg a day.  When on the DASH eating plan, aim to eat more fresh fruits and vegetables, whole grains, lean proteins, low-fat dairy, and heart-healthy fats.  Work with your health care provider or diet and nutrition specialist (dietitian) to  adjust your eating plan to your individual calorie needs.   Please check your blood pressure on a regular basis.  If it is consistently greater than 140/85, please make an office appointment.

## 2017-08-27 DIAGNOSIS — I480 Paroxysmal atrial fibrillation: Secondary | ICD-10-CM | POA: Diagnosis not present

## 2017-08-27 DIAGNOSIS — G40909 Epilepsy, unspecified, not intractable, without status epilepticus: Secondary | ICD-10-CM | POA: Diagnosis not present

## 2017-08-27 DIAGNOSIS — Z4789 Encounter for other orthopedic aftercare: Secondary | ICD-10-CM | POA: Diagnosis not present

## 2017-08-27 DIAGNOSIS — M19071 Primary osteoarthritis, right ankle and foot: Secondary | ICD-10-CM | POA: Diagnosis not present

## 2017-08-27 DIAGNOSIS — D649 Anemia, unspecified: Secondary | ICD-10-CM | POA: Diagnosis not present

## 2017-08-27 DIAGNOSIS — M069 Rheumatoid arthritis, unspecified: Secondary | ICD-10-CM | POA: Diagnosis not present

## 2017-08-27 DIAGNOSIS — G20 Parkinson's disease: Secondary | ICD-10-CM | POA: Diagnosis not present

## 2017-08-27 DIAGNOSIS — M86071 Acute hematogenous osteomyelitis, right ankle and foot: Secondary | ICD-10-CM | POA: Diagnosis not present

## 2017-08-27 DIAGNOSIS — E785 Hyperlipidemia, unspecified: Secondary | ICD-10-CM | POA: Diagnosis not present

## 2017-08-27 DIAGNOSIS — B965 Pseudomonas (aeruginosa) (mallei) (pseudomallei) as the cause of diseases classified elsewhere: Secondary | ICD-10-CM | POA: Diagnosis not present

## 2017-08-28 ENCOUNTER — Other Ambulatory Visit: Payer: Self-pay | Admitting: Pharmacist

## 2017-08-31 ENCOUNTER — Telehealth: Payer: Self-pay | Admitting: *Deleted

## 2017-08-31 ENCOUNTER — Encounter: Payer: Self-pay | Admitting: Infectious Diseases

## 2017-08-31 DIAGNOSIS — Z5181 Encounter for therapeutic drug level monitoring: Secondary | ICD-10-CM | POA: Diagnosis not present

## 2017-08-31 NOTE — Telephone Encounter (Signed)
Pt states she received a bill for over $1500.00 for the MRI.

## 2017-08-31 NOTE — Telephone Encounter (Signed)
I left message informing pt we had received PA for the MRI, which states only it will cover the MRI as described by his insurance or possibly go to his deductible, and I would mail him a copy of the approval if he needed it for his insurance records.

## 2017-09-01 ENCOUNTER — Ambulatory Visit (INDEPENDENT_AMBULATORY_CARE_PROVIDER_SITE_OTHER): Payer: Medicare HMO

## 2017-09-01 ENCOUNTER — Telehealth: Payer: Self-pay | Admitting: *Deleted

## 2017-09-01 ENCOUNTER — Ambulatory Visit (INDEPENDENT_AMBULATORY_CARE_PROVIDER_SITE_OTHER): Payer: Medicare HMO | Admitting: Podiatry

## 2017-09-01 ENCOUNTER — Encounter: Payer: Self-pay | Admitting: Podiatry

## 2017-09-01 DIAGNOSIS — K219 Gastro-esophageal reflux disease without esophagitis: Secondary | ICD-10-CM | POA: Diagnosis not present

## 2017-09-01 DIAGNOSIS — M0579 Rheumatoid arthritis with rheumatoid factor of multiple sites without organ or systems involvement: Secondary | ICD-10-CM | POA: Diagnosis not present

## 2017-09-01 DIAGNOSIS — L089 Local infection of the skin and subcutaneous tissue, unspecified: Secondary | ICD-10-CM | POA: Diagnosis not present

## 2017-09-01 DIAGNOSIS — D649 Anemia, unspecified: Secondary | ICD-10-CM | POA: Diagnosis not present

## 2017-09-01 DIAGNOSIS — Z4801 Encounter for change or removal of surgical wound dressing: Secondary | ICD-10-CM | POA: Diagnosis not present

## 2017-09-01 DIAGNOSIS — B965 Pseudomonas (aeruginosa) (mallei) (pseudomallei) as the cause of diseases classified elsewhere: Secondary | ICD-10-CM | POA: Diagnosis not present

## 2017-09-01 DIAGNOSIS — L97519 Non-pressure chronic ulcer of other part of right foot with unspecified severity: Secondary | ICD-10-CM | POA: Diagnosis not present

## 2017-09-01 DIAGNOSIS — E669 Obesity, unspecified: Secondary | ICD-10-CM | POA: Diagnosis not present

## 2017-09-01 DIAGNOSIS — R5383 Other fatigue: Secondary | ICD-10-CM | POA: Diagnosis not present

## 2017-09-01 DIAGNOSIS — M86071 Acute hematogenous osteomyelitis, right ankle and foot: Secondary | ICD-10-CM | POA: Diagnosis not present

## 2017-09-01 DIAGNOSIS — M2550 Pain in unspecified joint: Secondary | ICD-10-CM | POA: Diagnosis not present

## 2017-09-01 DIAGNOSIS — R69 Illness, unspecified: Secondary | ICD-10-CM | POA: Diagnosis not present

## 2017-09-01 DIAGNOSIS — Z6830 Body mass index (BMI) 30.0-30.9, adult: Secondary | ICD-10-CM | POA: Diagnosis not present

## 2017-09-01 DIAGNOSIS — Z4789 Encounter for other orthopedic aftercare: Secondary | ICD-10-CM | POA: Diagnosis not present

## 2017-09-01 DIAGNOSIS — M869 Osteomyelitis, unspecified: Secondary | ICD-10-CM

## 2017-09-01 NOTE — Progress Notes (Signed)
Subjective: 72 year old male presents the office today for his first postoperative visit status post wound excision, first metatarsal osteotomy on the right side.  He is continue with IV antibiotics.  He has been administering this for himself.  His friend continues to change the bandage daily.  They state that they are unable to get a lot of packing into the wound.  Denies any drainage or pus sterile dressing changes.  Denies any increase in swelling or redness to his feet.  He has no other concerns.   He is continue to walk in the Darco wedge shoe.  He has no other concerns today.  Objective: AAO x3, NAD DP/PT pulses palpable bilaterally, CRT less than 3 seconds On the right that the incision appears to be well coapted.  On the central aspect of the incision proximally is with areas packed and there is mild macerated tissue on the area.  The remainder of the incision remains well coapted with sutures intact.  Still continues to probe about 2 cm.  There is no probing to bone and there is no fluctuation or crepitation.  There is no pus.  Only small amount of serous drainage was expressed. No other open lesions present.  No open lesions or pre-ulcerative lesions.  No pain with calf compression, swelling, warmth, erythema  Assessment: Osteomyelitis right status post partial first metatarsal ostectomy, debridement  Plan: -All treatment options discussed with the patient including all alternatives, risks, complications.  -X-rays were obtained and reviewed.  Multiple digital contractures are present.  Resection margins the first metatarsal appears to be sharp.  No evidence of acute osteomyelitis and there is no soft tissue emphysema present. -The wound was irrigated with saline and packing was applied.  Compression bandage was applied. -Continue with antibiotic ointment as directed by infectious disease. -Continue offloading at all times. -Continue nonweightbearing.  He has been walking around the  surgical shoe with Darco wedge shoe. -Monitor for any clinical signs or symptoms of infection and directed to call the office immediately should any occur or go to the ER. -RTC 1 week or sooner if needed. Encouraged to call with any questions or concerns or any changes.   Trula Slade DPM

## 2017-09-01 NOTE — Telephone Encounter (Signed)
Cecille Rubin, RN from [facility], called for advice. Patient's PICC would not flush or pull back. She was called onsite to administer Cathflow today which was ineffective.  She needs verbal order to insert peripheral line for vancomycin dose.  RN gave Cecille Rubin Dr Hatcher's pager for orders. Please advise if patient will need new PICC. Landis Gandy, RN

## 2017-09-03 ENCOUNTER — Other Ambulatory Visit: Payer: Self-pay | Admitting: Pharmacist

## 2017-09-03 ENCOUNTER — Encounter: Payer: Self-pay | Admitting: Infectious Diseases

## 2017-09-03 DIAGNOSIS — G40909 Epilepsy, unspecified, not intractable, without status epilepticus: Secondary | ICD-10-CM | POA: Diagnosis not present

## 2017-09-03 DIAGNOSIS — M86071 Acute hematogenous osteomyelitis, right ankle and foot: Secondary | ICD-10-CM | POA: Diagnosis not present

## 2017-09-03 DIAGNOSIS — E785 Hyperlipidemia, unspecified: Secondary | ICD-10-CM | POA: Diagnosis not present

## 2017-09-03 DIAGNOSIS — Z7689 Persons encountering health services in other specified circumstances: Secondary | ICD-10-CM | POA: Diagnosis not present

## 2017-09-03 DIAGNOSIS — I480 Paroxysmal atrial fibrillation: Secondary | ICD-10-CM | POA: Diagnosis not present

## 2017-09-03 DIAGNOSIS — D649 Anemia, unspecified: Secondary | ICD-10-CM | POA: Diagnosis not present

## 2017-09-03 DIAGNOSIS — Z4789 Encounter for other orthopedic aftercare: Secondary | ICD-10-CM | POA: Diagnosis not present

## 2017-09-03 DIAGNOSIS — M19071 Primary osteoarthritis, right ankle and foot: Secondary | ICD-10-CM | POA: Diagnosis not present

## 2017-09-03 DIAGNOSIS — G20 Parkinson's disease: Secondary | ICD-10-CM | POA: Diagnosis not present

## 2017-09-03 DIAGNOSIS — M069 Rheumatoid arthritis, unspecified: Secondary | ICD-10-CM | POA: Diagnosis not present

## 2017-09-03 DIAGNOSIS — B965 Pseudomonas (aeruginosa) (mallei) (pseudomallei) as the cause of diseases classified elsewhere: Secondary | ICD-10-CM | POA: Diagnosis not present

## 2017-09-07 ENCOUNTER — Encounter: Payer: Self-pay | Admitting: Infectious Diseases

## 2017-09-07 ENCOUNTER — Telehealth: Payer: Self-pay | Admitting: *Deleted

## 2017-09-07 DIAGNOSIS — B965 Pseudomonas (aeruginosa) (mallei) (pseudomallei) as the cause of diseases classified elsewhere: Secondary | ICD-10-CM | POA: Diagnosis not present

## 2017-09-07 DIAGNOSIS — E785 Hyperlipidemia, unspecified: Secondary | ICD-10-CM | POA: Diagnosis not present

## 2017-09-07 DIAGNOSIS — D649 Anemia, unspecified: Secondary | ICD-10-CM | POA: Diagnosis not present

## 2017-09-07 DIAGNOSIS — M069 Rheumatoid arthritis, unspecified: Secondary | ICD-10-CM | POA: Diagnosis not present

## 2017-09-07 DIAGNOSIS — M86071 Acute hematogenous osteomyelitis, right ankle and foot: Secondary | ICD-10-CM | POA: Diagnosis not present

## 2017-09-07 DIAGNOSIS — Z4789 Encounter for other orthopedic aftercare: Secondary | ICD-10-CM | POA: Diagnosis not present

## 2017-09-07 DIAGNOSIS — I480 Paroxysmal atrial fibrillation: Secondary | ICD-10-CM | POA: Diagnosis not present

## 2017-09-07 DIAGNOSIS — G40909 Epilepsy, unspecified, not intractable, without status epilepticus: Secondary | ICD-10-CM | POA: Diagnosis not present

## 2017-09-07 DIAGNOSIS — G20 Parkinson's disease: Secondary | ICD-10-CM | POA: Diagnosis not present

## 2017-09-07 DIAGNOSIS — M19071 Primary osteoarthritis, right ankle and foot: Secondary | ICD-10-CM | POA: Diagnosis not present

## 2017-09-07 NOTE — Telephone Encounter (Signed)
"  I just talked to [facility] office in [place].  They looked up the account.  What happened was that they got a denial for a provider that they work with all the time.  She said she was going to take care of it.  She said to ignore the bill."

## 2017-09-08 ENCOUNTER — Ambulatory Visit (INDEPENDENT_AMBULATORY_CARE_PROVIDER_SITE_OTHER): Payer: Medicare HMO | Admitting: Podiatry

## 2017-09-08 ENCOUNTER — Telehealth: Payer: Self-pay | Admitting: Infectious Diseases

## 2017-09-08 ENCOUNTER — Telehealth: Payer: Self-pay

## 2017-09-08 ENCOUNTER — Encounter: Payer: Self-pay | Admitting: Podiatry

## 2017-09-08 DIAGNOSIS — M869 Osteomyelitis, unspecified: Secondary | ICD-10-CM

## 2017-09-08 DIAGNOSIS — L97519 Non-pressure chronic ulcer of other part of right foot with unspecified severity: Secondary | ICD-10-CM

## 2017-09-08 MED ORDER — CIPROFLOXACIN HCL 500 MG PO TABS: 500.0000 mg | ORAL_TABLET | Freq: Two times a day (BID) | ORAL | 0 refills | Status: DC

## 2017-09-08 NOTE — Telephone Encounter (Signed)
Called by [facility] for pt developing rash.  He had ceftaz held over weekend.  His rash has persisted.  He has been taking benadryl at home.  His end date is 5-15.  Will have [facility] let us know how he is doing on 09-10-17

## 2017-09-08 NOTE — Telephone Encounter (Signed)
Patient has concerns about going without antibiotics after PICC is removed.  Per Dr Johnnye Sima / Verbal order Cipro 500 mg one twice daily

## 2017-09-09 DIAGNOSIS — H5021 Vertical strabismus, right eye: Secondary | ICD-10-CM | POA: Diagnosis not present

## 2017-09-09 DIAGNOSIS — H16002 Unspecified corneal ulcer, left eye: Secondary | ICD-10-CM | POA: Diagnosis not present

## 2017-09-09 DIAGNOSIS — H532 Diplopia: Secondary | ICD-10-CM | POA: Diagnosis not present

## 2017-09-10 ENCOUNTER — Ambulatory Visit (INDEPENDENT_AMBULATORY_CARE_PROVIDER_SITE_OTHER): Payer: Medicare HMO

## 2017-09-10 ENCOUNTER — Ambulatory Visit (INDEPENDENT_AMBULATORY_CARE_PROVIDER_SITE_OTHER): Payer: Medicare HMO | Admitting: Podiatry

## 2017-09-10 DIAGNOSIS — R609 Edema, unspecified: Secondary | ICD-10-CM

## 2017-09-10 DIAGNOSIS — L97519 Non-pressure chronic ulcer of other part of right foot with unspecified severity: Secondary | ICD-10-CM | POA: Diagnosis not present

## 2017-09-10 NOTE — Telephone Encounter (Signed)
That sounds fine  thanks

## 2017-09-10 NOTE — Telephone Encounter (Signed)
Chase Picket nurse from [facility] called stating patient's PICC line is D/C and the patient is requesting to be discharged from [facility] services because he and his girlfriend are doing daily foot dressing changes are are competent and able to do them without assistance.   Pricilla Riffle RN

## 2017-09-10 NOTE — Telephone Encounter (Signed)
Called nurse Chase Picket back and informed him per Dr. Johnnye Sima it is ok for him to discontinue home health services at this time.  Chase Picket nurse verbalized understanding and he stated he would fax over the discharge summary. Pricilla Riffle RN

## 2017-09-10 NOTE — Progress Notes (Signed)
Subjective: 72 year old male presents the office today for his first postoperative visit status post wound excision, first metatarsal osteotomy on the right side.  He is continue with IV antibiotics we discussed a small rash.  He also states his been more active and he actually drove himself to the office today.  Overall he had no pain.  He denies any systemic complaints including fevers, chills, nausea, vomiting.  No calf pain, chest pain, shortness of breath.  He has no other concerns today.  Objective: AAO x3, NAD DP/PT pulses palpable bilaterally, CRT less than 3 seconds On the right that the incision appears to be well coapted  And sutures are intact.  Still in the central aspect of the incision approximately is where the packing is present.  Still probes approximately 2.4 cm.  There is no probing to bone there is no drainage or pus identified.  Mild macerated tissue along the wound for the packing is present.  Mild increase in swelling to the foot.  No significant erythema or increase in warmth.  There is no fluctuation or crepitation is no malodor. No other open lesions or pre-ulcerative lesions.  No pain with calf compression, swelling, warmth, erythema  Assessment: Osteomyelitis right status post partial first metatarsal ostectomy, debridement  Plan: -All treatment options discussed with the patient including all alternatives, risks, complications.  -The wound was irrigated today with saline and was repacked with iodoform dressing.  Compression wrap was also applied.  I strongly encourage nonweightbearing to the office foot to allow healing.  Continue antibiotics per infectious disease.  He is afebrile on exam today but continue to monitor his temperature daily. -Monitor for any clinical signs or symptoms of infection and directed to call the office immediately should any occur or go to the ER. -RTC 1 week or sooner if needed. Encouraged to call with any questions or concerns or any  changes.   Trula Slade DPM

## 2017-09-11 ENCOUNTER — Other Ambulatory Visit: Payer: Self-pay | Admitting: Pharmacist

## 2017-09-11 NOTE — Progress Notes (Signed)
[facility]AT lab monitoring 

## 2017-09-14 NOTE — Progress Notes (Signed)
Subjective: 72 year old male presents the office today for his first postoperative visit status post wound excision, first metatarsal osteotomy on the right side.  He presents today due to increased swelling to the right foot.  Since I last saw him he developed a rash over his entire body the PICC line was discontinued.  He did start ciprofloxacin today.  He noticed increasing swelling once coming off of the IV antibiotics.  He presents today walking in the surgical shoe he drove himself to the office.  He denies any systemic complaints including fevers, chills, nausea, vomiting.  No calf pain, chest pain, shortness of breath.  He has no other concerns today.  Objective: AAO x3, NAD DP/PT pulses palpable bilaterally, CRT less than 3 seconds On the right that the incision appears to be well coapted  And sutures are intact.  Still in the central aspect of the incision approximately is where the packing is present.  Still probes approximately 2.4 cm.  There is no probing to bone there is no drainage or pus identified.  Mild macerated tissue along the wound for the packing is present.  There is an increase in edema to the right foot there is no increase in erythema.  There is no increase in warmth of the foot.  No other open lesions or pre-ulcerative lesions.  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  -X-rays were obtained reviewed.  There is no definitive changes to the amputation site of the first metatarsal or the other areas of the foot concerning for acute osteomyelitis.  There is no soft tissue emphysema. -The wound was irrigated with saline.  The area was repacked with Iodosorb dressing.  Betadine was painted over the incision followed by dry sterile dressing and a compression bandage was also  applied. -Continue antibiotics per infectious disease -Continue to recommend and stressed the importance of nonweightbearing.  He is also been more active recently and is been driving more.  I advised against this. -RTC as scheduled or sooner if needed. Monitor for any clinical signs or symptoms of infection and directed to call the office immediately should any occur or go to the ER.  Trula Slade DPM

## 2017-09-15 ENCOUNTER — Ambulatory Visit (INDEPENDENT_AMBULATORY_CARE_PROVIDER_SITE_OTHER): Payer: Medicare HMO | Admitting: Podiatry

## 2017-09-15 ENCOUNTER — Encounter: Payer: Self-pay | Admitting: Podiatry

## 2017-09-15 DIAGNOSIS — L97519 Non-pressure chronic ulcer of other part of right foot with unspecified severity: Secondary | ICD-10-CM

## 2017-09-15 DIAGNOSIS — M869 Osteomyelitis, unspecified: Secondary | ICD-10-CM

## 2017-09-16 NOTE — Telephone Encounter (Signed)
Error

## 2017-09-16 NOTE — Progress Notes (Signed)
Subjective: 72 year old male presents the office today for his first postoperative visit status post wound excision, first metatarsal osteotomy on the right side.  He presents today walking in the surgical shoe and atrial here with his dog.  He states he has been trying to step his foot more.  He is continued on the oral antibiotics.  He is continue daily dressing changes and he has not had any pus coming from the area.  The bandages wet today but he states that he took a shower this morning the bandage got wet. He denies any systemic complaints including fevers, chills, nausea, vomiting.  No calf pain, chest pain, shortness of breath.  He has no other concerns today.  Objective: AAO x3, NAD DP/PT pulses palpable bilaterally, CRT less than 3 seconds On the right that the incision appears to be well coapted and the remainder of the sutures are intact.  Still in the central aspect of the incision approximately is where the packing is present.  Still probes approximately 2.2 cm. There is no probing to bone there is no drainage or pus identified.  Mild macerated tissue along the wound for the packing is present however this is improved.  There is decrease edema to the foot and there is no erythema or increase in warmth.  There is no areas of fluctuation or crepitation there is no malodor. 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  -The wound was irrigated today with saline.  It was repacked with iodoform dressing followed by dry sterile dressing and a compression bandage.  Continue with daily dressing changes.  Continue antibiotic per infectious disease.  The wound appears to be filling in although slowly.  Continue to stressed nonweightbearing to stay off of his foot. -Monitor for any clinical signs or  symptoms of infection and directed to call the office immediately should any occur or go to the ER.  Trula Slade DPM

## 2017-09-21 ENCOUNTER — Ambulatory Visit (INDEPENDENT_AMBULATORY_CARE_PROVIDER_SITE_OTHER): Payer: Medicare HMO | Admitting: Family Medicine

## 2017-09-21 ENCOUNTER — Ambulatory Visit: Payer: Self-pay

## 2017-09-21 ENCOUNTER — Encounter: Payer: Self-pay | Admitting: Family Medicine

## 2017-09-21 VITALS — BP 162/100 | HR 90 | Temp 98.4°F | Wt 227.0 lb

## 2017-09-21 DIAGNOSIS — I4891 Unspecified atrial fibrillation: Secondary | ICD-10-CM | POA: Diagnosis not present

## 2017-09-21 DIAGNOSIS — R600 Localized edema: Secondary | ICD-10-CM | POA: Diagnosis not present

## 2017-09-21 DIAGNOSIS — I499 Cardiac arrhythmia, unspecified: Secondary | ICD-10-CM

## 2017-09-21 DIAGNOSIS — I10 Essential (primary) hypertension: Secondary | ICD-10-CM | POA: Insufficient documentation

## 2017-09-21 MED ORDER — METOPROLOL SUCCINATE ER 50 MG PO TB24: 50.0000 mg | ORAL_TABLET | Freq: Every day | ORAL | 2 refills | Status: DC

## 2017-09-21 MED ORDER — FUROSEMIDE 20 MG PO TABS: 20.0000 mg | ORAL_TABLET | Freq: Every day | ORAL | 2 refills | Status: DC

## 2017-09-21 MED ORDER — APIXABAN 5 MG PO TABS: 5.0000 mg | ORAL_TABLET | Freq: Two times a day (BID) | ORAL | 0 refills | Status: DC

## 2017-09-21 NOTE — Telephone Encounter (Signed)
Pt calling with elevated BP this weekend as high as 170/114. Today BP was 137/70. Pt c/o fatigue and chief complaint as well as bilateral lower leg pitting edema.  Pt stated that his fatigue/weakness makes it hard to walk across the yard without feeling weak. He stated that he has several foot surgeries and was recently on IV abx For osteomyelitis/ulcer on the right foot.  Appt made before NT call. Pt to be seen with Dr Sarajane Jews today. Reason for Disposition . [1] MODERATE weakness (i.e., interferes with work, school, normal activities) AND [2] persists > 3 days . [1] MODERATE leg swelling (e.g., swelling extends up to knees) AND [2] new onset or worsening  Answer Assessment - Initial Assessment Questions 1. DESCRIPTION: "Describe how you are feeling."   Gets tired very easy 2. SEVERITY: "How bad is it?"  "Can you stand and walk?"   - MILD - Feels weak or tired, but does not interfere with work, school or normal activities   - Clay to stand and walk; weakness interferes with work, school, or normal activities   - SEVERE - Unable to stand or walk     moderate 3. ONSET:  "When did the weakness begin?"     2 weeksago 4. CAUSE: "What do you think is causing the weakness?"     Heart failure 5. MEDICINES: "Have you recently started a new medicine or had a change in the amount of a medicine?"     carvadopa 2 antibiotics and oral abx  6. OTHER SYMPTOMS: "Do you have any other symptoms?" (e.g., chest pain, fever, cough, SOB, vomiting, diarrhea, bleeding)     SOB, swollen leg from knee down right greater than left 7. PREGNANCY: "Is there any chance you are pregnant?" "When was your last menstrual period?"     n/a  Answer Assessment - Initial Assessment Questions 1. ONSET: "When did the swelling start?" (e.g., minutes, hours, days)     2 weeks 2. LOCATION: "What part of the leg is swollen?"  "Are both legs swollen or just one leg?"     Both legs 3. SEVERITY: "How bad is the swelling?"  (e.g., localized; mild, moderate, severe)  - Localized - small area of swelling localized to one leg  - MILD pedal edema - swelling limited to foot and ankle, pitting edema < 1/4 inch (6 mm) deep, rest and elevation eliminate most or all swelling  - MODERATE edema - swelling of lower leg to knee, pitting edema > 1/4 inch (6 mm) deep, rest and elevation only partially reduce swelling  - SEVERE edema - swelling extends above knee, facial or hand swelling present      moderate 4. REDNESS: "Does the swelling look red or infected?"     no 5. PAIN: "Is the swelling painful to touch?" If so, ask: "How painful is it?"   (Scale 1-10; mild, moderate or severe)     no 6. FEVER: "Do you have a fever?" If so, ask: "What is it, how was it measured, and when did it start?"      no 7. CAUSE: "What do you think is causing the leg swelling?"     Pt does not know 8. MEDICAL HISTORY: "Do you have a history of heart failure, kidney disease, liver failure, or cancer?"     no 9. RECURRENT SYMPTOM: "Have you had leg swelling before?" If so, ask: "When was the last time?" "What happened that time?"     no 10. OTHER SYMPTOMS: "  Do you have any other symptoms?" (e.g., chest pain, difficulty breathing)       SOB 11. PREGNANCY: "Is there any chance you are pregnant?" "When was your last menstrual period?"       n/a  Protocols used: WEAKNESS (GENERALIZED) AND FATIGUE-A-AH, LEG SWELLING AND EDEMA-A-AH

## 2017-09-21 NOTE — Progress Notes (Signed)
   Subjective:    Patient ID: Samuel Moran, male    DOB: 11-23-1945, 72 y.o.   MRN: [id number]  HPI Here to discuss several weeks if high BP, often getting to the 808U systolic over the 11S diastolic. He has felt weak and gets SOB with little exertion. No chest pain or palpitations. He has been having a lot more swelling in the lower legs then usual. He has a hx of paroxysmal atrial fibrillation but he has never been on anticoagulants other then aspirin. An ECHO on 07-27-17 showed mild LVH with an EF of 55-60%. Labs on 08-09-17 showed normal renal function with a creatinine of 0.9.    Review of Systems  Constitutional: Negative.   Respiratory: Positive for shortness of breath. Negative for cough, chest tightness and wheezing.   Cardiovascular: Positive for leg swelling. Negative for chest pain and palpitations.  Neurological: Negative.        Objective:   Physical Exam  Constitutional: He is oriented to person, place, and time. He appears well-developed and well-nourished. No distress.  Neck: No thyromegaly present.  Cardiovascular: Normal rate, normal heart sounds and intact distal pulses.  Irregular rhythm. EKG shows atrial fibrillation with good rate control   Pulmonary/Chest: Effort normal and breath sounds normal. No stridor. No respiratory distress. He has no wheezes. He has no rales.  Musculoskeletal:  3+ edema to both lower legs   Lymphadenopathy:    He has no cervical adenopathy.  Neurological: He is alert and oriented to person, place, and time.          Assessment & Plan:  He is currently in atrial fibrillation with good ventricular rate control. His BP has been high and he has worsened leg edema. We will start him on Metoprolol succinate 50 mg daily along with Lasix 20 mg daily. Start Eliquis 5 mg bid. Refer to Cardiology.  Alysia Penna, MD

## 2017-09-22 ENCOUNTER — Ambulatory Visit (INDEPENDENT_AMBULATORY_CARE_PROVIDER_SITE_OTHER): Payer: Medicare HMO | Admitting: Podiatry

## 2017-09-22 ENCOUNTER — Encounter: Payer: Self-pay | Admitting: Podiatry

## 2017-09-22 VITALS — BP 154/121 | HR 79 | Temp 96.6°F | Resp 18

## 2017-09-22 DIAGNOSIS — M869 Osteomyelitis, unspecified: Secondary | ICD-10-CM

## 2017-09-22 DIAGNOSIS — L97519 Non-pressure chronic ulcer of other part of right foot with unspecified severity: Secondary | ICD-10-CM

## 2017-09-23 ENCOUNTER — Inpatient Hospital Stay: Payer: Medicare HMO | Admitting: Infectious Diseases

## 2017-09-23 NOTE — Progress Notes (Signed)
Subjective: 72 year old male presents the office today for his first postoperative visit status post wound excision, first metatarsal osteotomy on the right side.  He presents this is a Band-Aid over the area.  He has not been packing in the last couple of days.  I last saw him he has not seen his primary care physician due to A. fib and he denies significant increase in swelling to his right leg rehab swelling to both of his legs.  He denies any increase in pain in the foot denies any redness or warmth and he has not had any drainage or pus coming from the wound.  He did start his Eliquis last night but he has not yet started the furosemide.  He is still on ciprofloxacin.  He denies any systemic complaints including fevers, chills, nausea, vomiting.  No calf pain, chest pain, shortness of breath.  He has no other concerns today.  Objective: AAO x3, NAD DP/PT pulses palpable bilaterally, CRT less than 3 seconds On the right that the incision appears to be well coapted.  Along the central superior portion incision along the area the previous packing small amount of serosanguineous drainage was expressed but there is no purulence.  Unable to probe down approximately   1.7 cm but not able to probe to bone.  Unable to identify any area of abscess.  There is an increase in swelling to the entire right leg and foot but there is no pain with calf compression and the calf appears to be supple and there is no signs of DVT.  He is also seen his primary care physician for this.  There is no erythema or increase in warmth of the foot.  There is no fluctuation or crepitation or any malodor. 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  -Today the wound was irrigated unable to identify any area of abscess.  Areas packed  with iodoform packing followed by dry sterile dressing to continue this at home as well.  Continue ciprofloxacin.  He has a compression bandage at home but I want him to use otherwise we can use Ace bandages to wrap the leg to help with the swelling.  Continue stressed importance of nonweightbearing and limit activity.  He continues to walk in a surgical shoe when he has been driving to the office.  Overall the wound is getting better but still very concerned about this. -Monitor for any clinical signs or symptoms of infection and directed to call the office immediately should any occur or go to the ER. -RTC 1 week or sooner if needed  Trula Slade DPM

## 2017-09-29 ENCOUNTER — Telehealth: Payer: Self-pay | Admitting: *Deleted

## 2017-09-29 ENCOUNTER — Ambulatory Visit (INDEPENDENT_AMBULATORY_CARE_PROVIDER_SITE_OTHER): Payer: Medicare HMO | Admitting: Podiatry

## 2017-09-29 DIAGNOSIS — M869 Osteomyelitis, unspecified: Secondary | ICD-10-CM

## 2017-09-29 DIAGNOSIS — L97519 Non-pressure chronic ulcer of other part of right foot with unspecified severity: Secondary | ICD-10-CM

## 2017-09-29 MED ORDER — CIPROFLOXACIN HCL 500 MG PO TABS: 500.0000 mg | ORAL_TABLET | Freq: Two times a day (BID) | ORAL | 0 refills | Status: DC

## 2017-09-29 NOTE — Progress Notes (Addendum)
Subjective: 72 year old male presents the office today for his first postoperative visit status post wound excision, first metatarsal osteotomy on the right side.  Overall he states that he is doing well.  Has been on his feet quite a bit.  The swelling has improved.  Start ciprofloxacin he has 1 more day of the medication.  Overall he has no changes he denies any systemic complaints including fevers, chills, nausea, vomiting.  Denies any calf pain, chest pain, shortness of breath.  He has no new concerns today.  Objective: AAO x3, NAD DP/PT pulses palpable bilaterally, CRT less than 3 seconds On the right that the incision appears to be well coapted.  Along the central aspect of the incision continues to be one area that probes approximately 2 cm.  There is only a small amount of clear drainage expressed but there is no purulence.  Mild macerated tissue in the area but overall this appears to be improving.  His friend continues to pack the wound daily.  There is decrease edema to the foot however does continue but there is no erythema associated with swelling.  There is no fluctuation or crepitation.  There is no malodor. 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  -The wound was irrigated with saline has a small amount of packing was applied.  Continue with packing to allow the area to continue to fill in.  Only small and clear drainage is expressed today.  Continue antibiotics as directed by infectious disease but I do think we should continue them until the wound heals some more.  I do continue to discuss nonweightbearing but he continues to walk in the cam boot and he actually continues to try inotropes the office again today.  Also discussed elevation as much as possible to help with the swelling.  Monitor for any clinical signs or symptoms of infection and directed to call the office immediately should any occur or go to the ER. -RTC 1 week or sooner if needed  *x-ray next appointment   Trula Slade DPM

## 2017-09-29 NOTE — Telephone Encounter (Signed)
Patient called to advise he is taking his last dose of Cipro today and does not see the provider until next week (due to reschedule). He wants to know if he should get a refill until he sees the provider.  Spoke with Dr Johnnye Sima and was advised to send in a refill for the patient to continue until his visit.  Patient is aware and will pick up today.

## 2017-09-30 ENCOUNTER — Encounter: Payer: Self-pay | Admitting: Neurology

## 2017-09-30 ENCOUNTER — Other Ambulatory Visit: Payer: Self-pay

## 2017-09-30 ENCOUNTER — Ambulatory Visit: Payer: Medicare HMO | Admitting: Neurology

## 2017-09-30 VITALS — BP 118/64 | HR 77 | Ht 71.0 in | Wt 219.0 lb

## 2017-09-30 DIAGNOSIS — I480 Paroxysmal atrial fibrillation: Secondary | ICD-10-CM | POA: Diagnosis not present

## 2017-09-30 DIAGNOSIS — R42 Dizziness and giddiness: Secondary | ICD-10-CM

## 2017-09-30 DIAGNOSIS — G20 Parkinson's disease: Secondary | ICD-10-CM

## 2017-09-30 NOTE — Patient Instructions (Signed)
1.  Ask your cardiologist if atrial fibrillation or the metoprolol (or both) are contributing to the dizziness.  I don't think that its likely it is the Parkinsons as your disease is very mild.  If cardiology doesn't feel meds need changed, I may trial you off of neupro.    Registration is OPEN!    Third Annual Parkinson's Education Symposium   To register: [email or web address]      Search:  [employer] person attending individually Questions: [place], [employer] or [email or web address] [email or web address]

## 2017-09-30 NOTE — Progress Notes (Signed)
Samuel Moran was seen today in the movement disorders clinic for neurologic consultation at the request of [name], MD.  The consultation is for the evaluation of R hand tremor and gait change x 6 months.  Tremor is noted at rest.  He is R hand dominant.  Tremor is gone when he picks up arm or makes a fist.  Pt states that he takes methotrexate and he noted that after he takes it he will shuffle for a few days and not swing the arms.  Dr. [name], therefore, took him off of the medication 3 weeks ago and he feels that walking is a bit better.  The records that were made available to me were reviewed.  Does report hx of seizure in 2008 and was at self checkout and couldn't figure out how to do steps.  Got finished, walked to car and next thing he remembers is being in ambulance.  Apparently, got in car and had death grip on wheel.  EMS got there and he was confused and wouldn't unlock door.  They broke window and took him to hospital.  W/U negative except "white spot" on brain.  03/14/16 update:  Pt returns for f/u much earlier than expected.  Has dx of PD, and last visit he didn't want to start medication.  He decided almost immediately after the visit that he wanted to start medication but he wanted to come back and discuss and discuss the medication in more detail.  Has a myriad of questions today.  Insurance company denied authorization for his MRI brain.  07/03/16 update:  Patient follows up today.  I started him on pramipexole last visit.  He called me in February to state that he had no side effects with medication, but his friends brother-in-law had a sleep attack on the medication and he was concerned about the potential side effect.  We had discussed this potential side effect previously.  Since he was side effect free, I told him that I would recommend that he continue on the medication.  2 weeks later he called to tell me that he was sleeping all of the time on the medication.    Today he states that it was really sleepiness but "spaciness" and "I wasn't all there."   I told him just to discontinue the medication and we could talk about it at follow-up.  I did get a note from his rheumatologist, Dr. [name], dated 06/30/2016 that stated that the patient had an acute rheumatoid arthritis flareup.  She thought that it was because he discontinued his methotrexate.  The patient did that because he thought methotrexate was causing dizziness, but she did not think that was the case and thought it was the fact that he was taking a different version of folic acid.  She restarted methotrexate and folic acid.  Thinks that he is moving around okay short of the RA flare.  He is riding the bike for 10 min 3 times a week.  No falls.  No lightheadedness or near syncope.  Mood has been okay.    09/03/16 update:  Patient seen today in follow-up.  He is not on any Parkinson's medication, which was his desire.  He got off of methotrexate, which he felt caused side effects.  He is now on leflunomide and would like to get back on medications for Parkinson's disease.  I just got a note from his rheumatologist that the patient wanted to wait on restarting methotrexate until  he started his Parkinson's medicine.  The reason we waited last visit to restart his Parkinson's medicine was because he was getting started back on rheumatoid medicine.  He brings medication pamphlet on rheumatoid meds he is considering so that meds we choose don't interfere.   The patient is having more trouble with tremor and arm swing.  He has not had any falls.  He has not had any lightheadedness or near syncope.  He denies significant depression.  12/09/16 update:  Patient seen in follow-up.  He was started on Neupro last visit.  He is now on 4 mg daily.  He has had no sleep attacks.  No compulsive behaviors.  No site rxn's. He thinks that it controls tremor well.  He did miss the medication on Sunday and then noted more tremor on Monday.   He had one fall in [place] but he caught his foot on a cushion; he didn't get hurt.  Pt denies lightheadedness, near syncope.  No hallucinations.  Mood has been good.  He is exercising on the stationary bike in the gym 4 days a week (11 min per time).    04/10/17 update: Patient seen in follow-up for Parkinson's disease.  He is on the Neupro patch, 4 mg daily. No site reactions. He called me last month to state that he felt his tremors worse.  He wanted me to adjust his Neupro.  I told him I wanted to see him first, as I already knew he had an pending appointment.  He has had no falls.   He had a single instance since last visit of lightheadedness/dizziness but he accidentally was wearing 2 neupro patches. I have reviewed records available to me since last visit.  He was seen in orthopedic surgeon who does spine for low back pain.  He has requested a referral to a neurosurgeon, which has been made by his primary care physician.  07/20/17 update: Patient is seen in follow-up for Parkinson's disease.  He is on Neupro patch, 4 mg daily.  He forgot to put it on today.  No site reactions.    Last visit, levodopa was added.  He is on carbidopa/levodopa 25/100, 1 tablet 3 times per day. He has not taken that today.   He reports that he feels he may be progressing with tremor.  The records that were made available to me were reviewed.  Been to podiatry multiple times since our last visit due to foot ulcer and had surgery for I&D on Friday.  MRI demonstrated findings consistent with osteomyelitis but patient states that he was told it didn't show infection.  Had to stop exercise because of foot pain.  Was previously doing stationary cycle.  Hands and feet feel stiff and has trouble differentiating his RA with PD.   09/30/17 update:  Pt worked in to the office today for c/o dizziness.  He is using a walking stick because of it.  It never happens when sitting down. On carbidopa/levodopa 25/100 tid and neupro patch, 4 mg  daily.  Pt denies falls.  No hallucinations.  Mood has been good.  Dx with a-fib since last visit.  The records that were made available to me were reviewed.  Pt cannot tell when in/out of a-fib.   Started on eliquis.  toprol was started about 3 weeks ago per patient.  Thinks dizziness preceeded the addition of toprol.    PREVIOUS MEDICATIONS: Pramipexole (patient had a friend who had a sleep attack on the medication and  subsequently the patient developed sleepiness on the medication and we decided to discontinue)  ALLERGIES:  No Known Allergies  CURRENT MEDICATIONS:  Outpatient Encounter Medications as of 09/30/2017  Medication Sig  . apixaban (ELIQUIS) 5 MG TABS tablet Take 1 tablet (5 mg total) by mouth 2 (two) times daily.  . carbidopa-levodopa (SINEMET IR) 25-100 MG tablet Take 1 tablet by mouth 3 (three) times daily.  . ciprofloxacin (CIPRO) 500 MG tablet Take 1 tablet (500 mg total) by mouth 2 (two) times daily.  . furosemide (LASIX) 20 MG tablet Take 1 tablet (20 mg total) by mouth daily.  . metoprolol succinate (TOPROL-XL) 50 MG 24 hr tablet Take 1 tablet (50 mg total) by mouth daily. Take with or immediately following a meal.  . Multiple Vitamins-Minerals (CENTRUM SILVER PO) Take 1 tablet by mouth daily.   Marland Kitchen NEUPRO 4 MG/24HR PLACE 1 PATCH ONTO THE SKIN DAILY.  . vancomycin (VANCOCIN) 10 G SOLR injection    No facility-administered encounter medications on file as of 09/30/2017.     PAST MEDICAL HISTORY:   Past Medical History:  Diagnosis Date  . Atrial fibrillation ([place])    onset 07/17/17  . Dysrhythmia   . ED (erectile dysfunction)   . GERD 07/01/2007  . Parkinson's disease ([place])   . 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 PROSTATE SPECIFIC ANTIGEN, ELEVATED 06/29/2007  . Rheumatoid arthritis ([place])   . SEIZURE DISORDER 07/01/2007   one epsiode only  . Spinal stenosis of lumbar region 09/22/2014    PAST SURGICAL HISTORY:   Past Surgical History:  Procedure Laterality Date  .  AMPUTATION     right great toe - tramatic age 35  . COLONOSCOPY    . DEBRIDEMENT TOE Right 07/2016  . HAND SURGERY  03/2017   removal cysts  . IR FLUORO GUIDE CV LINE RIGHT  10/16/2016  . IR US GUIDE VASC ACCESS RIGHT  10/16/2016  . OSTECTOMY Right 08/05/2017   Procedure: OSTECTOMY COMPLETE METARSAL HEAD FIRST RIGHT;  Surgeon: Trula Slade, DPM;  Location: [facility];  Service: Podiatry;  Laterality: Right;    SOCIAL HISTORY:   Social History   Socioeconomic History  . Marital status: Single    Spouse name: Not on file  . Number of children: Not on file  . Years of education: Not on file  . Highest education level: Not on file  Occupational History  . Occupation: Chartered certified accountant  Social Needs  . Financial resource strain: Not on file  . Food insecurity:    Worry: Not on file    Inability: Not on file  . Transportation needs:    Medical: Not on file    Non-medical: Not on file  Tobacco Use  . Smoking status: Light Tobacco Smoker    Types: Cigars  . Smokeless tobacco: Never Used  . Tobacco comment: 1 cigar a week  Substance and Sexual Activity  . Alcohol use: Yes    Alcohol/week: 2.0 oz    Types: 4 Standard drinks or equivalent per week    Comment: one drink a day (liquor)  . Drug use: No  . Sexual activity: Not on file  Lifestyle  . Physical activity:    Days per week: Not on file    Minutes per session: Not on file  . Stress: Not on file  Relationships  . Social connections:    Talks on phone: Not on file    Gets together: Not on file    Attends religious service: Not  on file    Active member of club or organization: Not on file    Attends meetings of clubs or organizations: Not on file    Relationship status: Not on file  . Intimate partner violence:    Fear of current or ex partner: Not on file    Emotionally abused: Not on file    Physically abused: Not on file    Forced sexual activity: Not on file  Other Topics Concern  . Not on file  Social History  Narrative  . Not on file    FAMILY HISTORY:   Family Status  Relation Name Status  . Mother  Deceased  . Father  Deceased  . Child adopted Alive  . Neg Hx  (Not Specified)    ROS:   A complete 10 system review of systems was obtained and was unremarkable apart from what is mentioned above.  PHYSICAL EXAMINATION:    VITALS:   Vitals:   09/30/17 1307  BP: 118/64  Pulse: 77  SpO2: 99%  Weight: 219 lb (99.3 kg)  Height: '5\' 11"'$  (1.803 m)   Orthostatic VS for the past 24 hrs (Last 3 readings):  BP- Lying Pulse- Lying BP- Sitting Pulse- Sitting BP- Standing at 0 minutes Pulse- Standing at 0 minutes  09/30/17 1311 116/66 76 118/66 80 120/82 80     GEN:  The patient appears stated age and is in NAD. HEENT:  Normocephalic, atraumatic.  The mucous membranes are moist. The superficial temporal arteries are without ropiness or tenderness. CV:  RRR (not in a-fib today) Lungs:  CTAB Neck/HEME:  There are no carotid bruits bilaterally. MS:  R foot in a boot and toes/feet wrapped in gauze.  Legs are both swollen with pitting edema.   Derm:  There are nodules over the PIP joints.  No skin site reactions where patches placed.  He has some wounds on R hand where rheumatoid nodules were taken off.    Neurological examination:  Orientation: The patient is alert and oriented x3.  Cranial nerves: There is good facial symmetry.  There is facial hypomimia.  The speech is fluent and clear.  He is hypophonic.  Soft palate rises symmetrically and there is no tongue deviation. Hearing is intact to conversational tone. Sensation: Sensation is intact to light touch throughout. Motor: Strength is at least antigravity x4.  Movement examination: Tone: There is normal tone in the UE/LE Abnormal movements:no tremor noted today Coordination:  There is no decremation with any form of RAMS, including alternating supination and pronation of the forearm, hand opening and closing, finger taps, heel taps and  toe taps bilaterally Gait and Station: The patient pushes off of the chair to arise.  He has a large boot on the right foot, which makes ambulation antalgic.  Otherwise, stride length is actually fairly good as his arm swing.  Has a walking stick but mostly just holds it.  LABS    Chemistry      Component Value Date/Time   NA 137 08/09/2017 0852   NA 139 07/08/2016 1158   K 3.7 08/09/2017 0852   CL 103 08/09/2017 0852   CO2 23 08/09/2017 0852   BUN 12 08/09/2017 0852   BUN 22 07/08/2016 1158   CREATININE 0.90 08/09/2017 0852   CREATININE 0.98 10/14/2016 1048      Component Value Date/Time   CALCIUM 8.8 (L) 08/09/2017 0852   ALKPHOS 49 02/26/2017 0922   AST 22 02/26/2017 0922   ALT 14 02/26/2017  9390   BILITOT 0.8 02/26/2017 3009     Lab Results  Component Value Date   TSH 1.59 02/26/2017   Lab Results  Component Value Date   VITAMINB12 202 08/05/2017     ASSESSMENT/PLAN:  1.  Idiopathic Parkinson's disease  -We discussed the diagnosis as well as pathophysiology of the disease.  We discussed treatment options as well as prognostic indicators.  Patient education was provided.  -continue neupro, 4 mg daily.  Discussed r/b/se of the medication extensively  -Continue with carbidopa/levodopa 25/100, 1 tablet three times per day.    2.  Rheumatoid arthritis  -seeing Dr. [name].  He was advised on getting new injections but copay was too expensive (was $200).  He wasn't sure how often he would need to have the shot.  3.  Dizziness  -not orthostatic in the office.  Take BP at home.  toprol may be contributing to that.  Wonder if the new onset PAF is the true etiology as he doesn't know when he is in/out of it.  He is to see cardiology on 6/3.  If still feels like that and cardiology doesn't feel related to PAF/meds, could always try to d/c neupro.  He has such mild PD, however, that I wouldn't expect it to be causing [place] at this point in time.  4.  F/u at previously scheduled  visit.     Cc:  [name], MD

## 2017-10-05 ENCOUNTER — Ambulatory Visit: Payer: Medicare HMO | Admitting: Cardiology

## 2017-10-05 ENCOUNTER — Encounter: Payer: Self-pay | Admitting: Cardiology

## 2017-10-05 VITALS — BP 116/66 | HR 76 | Ht 71.0 in | Wt 216.8 lb

## 2017-10-05 DIAGNOSIS — M069 Rheumatoid arthritis, unspecified: Secondary | ICD-10-CM | POA: Diagnosis not present

## 2017-10-05 DIAGNOSIS — I481 Persistent atrial fibrillation: Secondary | ICD-10-CM | POA: Diagnosis not present

## 2017-10-05 DIAGNOSIS — M5137 Other intervertebral disc degeneration, lumbosacral region: Secondary | ICD-10-CM | POA: Insufficient documentation

## 2017-10-05 DIAGNOSIS — I4819 Other persistent atrial fibrillation: Secondary | ICD-10-CM

## 2017-10-05 DIAGNOSIS — R42 Dizziness and giddiness: Secondary | ICD-10-CM

## 2017-10-05 DIAGNOSIS — I10 Essential (primary) hypertension: Secondary | ICD-10-CM

## 2017-10-05 DIAGNOSIS — G20 Parkinson's disease: Secondary | ICD-10-CM | POA: Diagnosis not present

## 2017-10-05 DIAGNOSIS — M51379 Other intervertebral disc degeneration, lumbosacral region without mention of lumbar back pain or lower extremity pain: Secondary | ICD-10-CM | POA: Insufficient documentation

## 2017-10-05 DIAGNOSIS — E785 Hyperlipidemia, unspecified: Secondary | ICD-10-CM | POA: Diagnosis not present

## 2017-10-05 MED ORDER — APIXABAN 5 MG PO TABS: 5.0000 mg | ORAL_TABLET | Freq: Two times a day (BID) | ORAL | 6 refills | Status: DC

## 2017-10-05 NOTE — Progress Notes (Signed)
Cardiology Consultation:    Date:  10/05/2017   ID:  Samuel Moran, DOB October 15, 1945, MRN [id number]  PCP:  [name], MD  Cardiologist:  Jenne Campus, MD   Referring MD: Laurey Morale, MD   Chief Complaint  Patient presents with  . Atrial Fibrillation  I have atrial fibrillation  History of Present Illness:    Samuel Moran is a 72 y.o. male who is being seen today for the evaluation of a fibrillation at the request of Laurey Morale, MD.  He does have quite complex past medical history that includes Parkinson's disease, rheumatoid arthritis, osteomyelitis of the right foot.  He was referred to [place] because of atrial fibrillation apparently recently he required few surgery on his right lower extremities because of osteomyelitis during one surgery he was noted to be in atrial fibrillation.  After that he went to his primary care physician who very appropriately initiated anticoagulation.  He is being anticoagulated however initially he took his anticoagulation wrong he took only 1 tablet 5 mg of Eliquis every single day.  He cannot feel atrial fibrillation he does not feel any irregularity he is not aware of his heart beating.  However, he complained of being weak tired and exhausted also exertional shortness of breath.  Echocardiogram was done in March which showed preserved left ventricular ejection fraction his left atrium was moderately enlarged.  He is not sure if he is snoring at night but wakes up many times during the night sometimes with dry mouth.  He never had microinfarction he never had any heart trouble.  Overall he is been very active.  He is to go to gym on the regular basis but now osteomyelitis and problem with his foot kept him away from it.  Past Medical History:  Diagnosis Date  . Atrial fibrillation ([place])    onset 07/17/17  . Dysrhythmia   . ED (erectile dysfunction)   . GERD 07/01/2007  . Parkinson's disease ([place])   . 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 PROSTATE SPECIFIC ANTIGEN, ELEVATED 06/29/2007  . Rheumatoid arthritis ([place])   . SEIZURE DISORDER 07/01/2007   one epsiode only  . Spinal stenosis of lumbar region 09/22/2014    Past Surgical History:  Procedure Laterality Date  . AMPUTATION     right great toe - tramatic age 45  . COLONOSCOPY    . DEBRIDEMENT TOE Right 07/2016  . HAND SURGERY  03/2017   removal cysts  . IR FLUORO GUIDE CV LINE RIGHT  10/16/2016  . IR US GUIDE VASC ACCESS RIGHT  10/16/2016  . OSTECTOMY Right 08/05/2017   Procedure: OSTECTOMY COMPLETE METARSAL HEAD FIRST RIGHT;  Surgeon: Trula Slade, DPM;  Location: [facility];  Service: Podiatry;  Laterality: Right;    Current Medications: Current Meds  Medication Sig  . apixaban (ELIQUIS) 5 MG TABS tablet Take 1 tablet (5 mg total) by mouth 2 (two) times daily. (Patient taking differently: Take 5 mg by mouth daily. )  . carbidopa-levodopa (SINEMET IR) 25-100 MG tablet Take 1 tablet by mouth 3 (three) times daily.  . ciprofloxacin (CIPRO) 500 MG tablet Take 1 tablet (500 mg total) by mouth 2 (two) times daily.  . furosemide (LASIX) 20 MG tablet Take 1 tablet (20 mg total) by mouth daily.  . metoprolol succinate (TOPROL-XL) 50 MG 24 hr tablet Take 1 tablet (50 mg total) by mouth daily. Take with or immediately following a meal.  . Multiple Vitamins-Minerals (CENTRUM SILVER PO)  Take 1 tablet by mouth daily.   Marland Kitchen NEUPRO 4 MG/24HR PLACE 1 PATCH ONTO THE SKIN DAILY.     Allergies:   Patient has no known allergies.   Social History   Socioeconomic History  . Marital status: Single    Spouse name: Not on file  . Number of children: Not on file  . Years of education: Not on file  . Highest education level: Not on file  Occupational History  . Occupation: Chartered certified accountant  Social Needs  . Financial resource strain: Not on file  . Food insecurity:    Worry: Not on file    Inability: Not on file  . Transportation needs:    Medical: Not on file     Non-medical: Not on file  Tobacco Use  . Smoking status: Light Tobacco Smoker    Types: Cigars  . Smokeless tobacco: Never Used  . Tobacco comment: 1 cigar, takes 3 weeks  Substance and Sexual Activity  . Alcohol use: Yes    Alcohol/week: 2.0 oz    Types: 4 Standard drinks or equivalent per week    Comment: one drink a day (liquor)  . Drug use: No  . Sexual activity: Not on file  Lifestyle  . Physical activity:    Days per week: Not on file    Minutes per session: Not on file  . Stress: Not on file  Relationships  . Social connections:    Talks on phone: Not on file    Gets together: Not on file    Attends religious service: Not on file    Active member of club or organization: Not on file    Attends meetings of clubs or organizations: Not on file    Relationship status: Not on file  Other Topics Concern  . Not on file  Social History Narrative  . Not on file     Family History: The patient's family history includes Alcoholism in his mother; Arthritis in his father and mother. There is no history of Colon cancer, Esophageal cancer, Rectal cancer, or Stomach cancer. ROS:   Please see the history of present illness.    All 14 point review of systems negative except as described per history of present illness.  EKGs/Labs/Other Studies Reviewed:    The following studies were reviewed today: EKG from 3 years ago showed normal sinus rhythm normal P interval normal QS complex duration morphology  EKG:  EKG is  ordered today.  The ekg ordered today demonstrates atrial fibrillation with controlled ventricular rate, normal QS complex duration morphology no ST segment changes  Recent Labs: 02/26/2017: ALT 14; TSH 1.59 08/05/2017: Magnesium 1.9 08/09/2017: BUN 12; Creatinine, Ser 0.90; Hemoglobin 10.8; Platelets 410; Potassium 3.7; Sodium 137  Recent Lipid Panel    Component Value Date/Time   CHOL 168 02/26/2017 0922   TRIG 55.0 02/26/2017 0922   HDL 52.90 02/26/2017 0922    CHOLHDL 3 02/26/2017 0922   VLDL 11.0 02/26/2017 0922   LDLCALC 104 (H) 02/26/2017 0922    Physical Exam:    VS:  BP 116/66   Pulse 76   Ht 5\' 11"  (1.803 m)   Wt 216 lb 12.8 oz (98.3 kg)   SpO2 96%   BMI 30.24 kg/m     Wt Readings from Last 3 Encounters:  10/05/17 216 lb 12.8 oz (98.3 kg)  09/30/17 219 lb (99.3 kg)  09/21/17 227 lb (103 kg)     GEN:  Well nourished, well developed in no  acute distress HEENT: Normal NECK: No JVD; No carotid bruits LYMPHATICS: No lymphadenopathy CARDIAC: Irregularly irregular, no murmurs, no rubs, no gallops RESPIRATORY:  Clear to auscultation without rales, wheezing or rhonchi  ABDOMEN: Soft, non-tender, non-distended MUSCULOSKELETAL:  No edema; No deformity  SKIN: Warm and dry NEUROLOGIC:  Alert and oriented x 3 PSYCHIATRIC:  Normal affect   ASSESSMENT:    1. Persistent atrial fibrillation ([place])   2. Essential hypertension   3. Parkinson disease ([place])   4. Rheumatoid arthritis of hand, unspecified laterality, unspecified rheumatoid factor presence ([place])   5. Dyslipidemia    PLAN:    In order of problems listed above:  1. Persistent atrial fibrillation.  Through this we do not know for how long he is being atrial fibrillation I see EKG from 2 years ago when he was in normal rhythm he did have few surgeries recently and on one he was noted to be in atrial fibrillation.  His left atrium apparently is moderately enlarged on his echocardiogram.  He has been very appropriately anticoagulated however the dose of medication was and appropriate.  He need to be taken 5 mg Eliquis twice daily we will send prescription for that dose for him.  I talked to him about reasons for atrial fibrillation I told him straight that we are not sure why he has this.  We talked about potentially cardioverting to sinus rhythm.  He complained of being weak tired and exhausted but he got multiple reasons to feel that way that includes osteomyelitis, Parkinson,  rheumatoid arthritis.  The only way to figure that out will be simply trying to get him back to normal rhythm and see how he feels.  Therefore, I will see him back in my office in about 1 month he will take his anticoagulation religiously twice daily 5 mg of Eliquis.  After that we will talk about potentially cardioverting to sinus rhythm.  He cannot tell if he snores but he wakes up sometimes with dry mouth in the middle of the night we will make arrangements for overnight oximetry to see if he could potentially sleep apnea. 2. He also complained of having some dizziness and again difficult issue trying to figure out if this is because of Parkinson's or atrial fibrillation.  I asked him to wear 24 hours Holter to see heart rate variability. 3. Essential hypertension: Blood pressure well controlled continue present management. 4. Parkinson's disease followed by neurology. 5. Rheumatoid arthritis followed by internal medicine team. 6. Dyslipidemia: Last cholesterol profile from 7 months ago showed LDL 104 HDL 53 will not initiate therapy for it now.   Medication Adjustments/Labs and Tests Ordered: Current medicines are reviewed at length with the patient today.  Concerns regarding medicines are outlined above.  No orders of the defined types were placed in this encounter.  No orders of the defined types were placed in this encounter.   Signed, Park Liter, MD, [facility]. 10/05/2017 10:48 AM    [place]

## 2017-10-05 NOTE — Patient Instructions (Signed)
Medication Instructions:  Your physician has recommended you make the following change in your medication:  START eliquis 5 mg twice daily  Labwork: None  Testing/Procedures: You had an EKG today.   Your physician has recommended that you wear a holter monitor. Holter monitors are medical devices that record the heart's electrical activity. Doctors most often use these monitors to diagnose arrhythmias. Arrhythmias are problems with the speed or rhythm of the heartbeat. The monitor is a small, portable device. You can wear one while you do your normal daily activities. This is usually used to diagnose what is causing palpitations/syncope (passing out). Wear for 24 hours.   You have been referred for overnight pulse oximetry testing in evaluation for sleep apnea. You will be contacted to schedule this appointment.   Follow-Up: Your physician recommends that you schedule a follow-up appointment in: 1 month.  If you need a refill on your cardiac medications before your next appointment, please call your pharmacy.   Thank you for choosing [facility]! Robyne Peers, RN [phone]

## 2017-10-06 ENCOUNTER — Ambulatory Visit (INDEPENDENT_AMBULATORY_CARE_PROVIDER_SITE_OTHER): Payer: Medicare HMO

## 2017-10-06 ENCOUNTER — Ambulatory Visit (INDEPENDENT_AMBULATORY_CARE_PROVIDER_SITE_OTHER): Payer: Medicare HMO | Admitting: Podiatry

## 2017-10-06 DIAGNOSIS — L97519 Non-pressure chronic ulcer of other part of right foot with unspecified severity: Secondary | ICD-10-CM

## 2017-10-06 DIAGNOSIS — M869 Osteomyelitis, unspecified: Secondary | ICD-10-CM

## 2017-10-06 NOTE — Progress Notes (Signed)
Subjective: 72 year old male presents the office today for his first postoperative visit status post wound excision, first metatarsal osteotomy on the right side.  He presents today wearing a Band-Aid over the incision without any packing wearing a regular shoe.  He states the swelling is improved and he overall feels well he denies any systemic complaints including fevers, chills, nausea, vomiting.  Denies any calf pain, chest pain, shortness of breath.  He has no other concerns today.  Objective: AAO x3, NAD DP/PT pulses palpable bilaterally, CRT less than 3 seconds On the right that the incision appears to be well coapted.  On the central aspect of the incision continues to be a wound that measures about 0.2 x 0.2 cm this is where the areas been packed open and probes approximately 1cm  There is no drainage or pus at all today.  During probing only some very minimal amount of bloody drainage was expressed.  There is no fluctuation or crepitation there is decrease edema to the foot.  Overall the foot appears to be much improved. 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  -X-rays were obtained and reviewed.  Remodeling present in the first metatarsal but there is no definitive evidence of acute osteomyelitis identified today and there is no soft tissue emphysema.  Multiple digital deformities are present. -Overall the wound is improving.  Continue to clean the area and antibiotic ointment and a bandage on the opening.  He has no packing in the area today and continues to improve.  Continue with compression at all times.  Elevation.  Limit activity.  I continue to use discussed the importance of trying to stay off his foot and limit activity but he is continue to drive and he has been wearing a regular  shoe. -Antibiotics per ID -Monitor for any clinical signs or symptoms of infection and directed to call the office immediately should any occur or go to the ER. -RTC 1 week or sooner if needed  Trula Slade DPM

## 2017-10-07 ENCOUNTER — Ambulatory Visit: Payer: Medicare HMO

## 2017-10-07 ENCOUNTER — Ambulatory Visit: Payer: Medicare HMO | Admitting: Infectious Diseases

## 2017-10-07 ENCOUNTER — Encounter: Payer: Self-pay | Admitting: Infectious Diseases

## 2017-10-07 DIAGNOSIS — I481 Persistent atrial fibrillation: Secondary | ICD-10-CM | POA: Diagnosis not present

## 2017-10-07 DIAGNOSIS — I4891 Unspecified atrial fibrillation: Secondary | ICD-10-CM | POA: Diagnosis not present

## 2017-10-07 DIAGNOSIS — M869 Osteomyelitis, unspecified: Secondary | ICD-10-CM | POA: Diagnosis not present

## 2017-10-07 DIAGNOSIS — R42 Dizziness and giddiness: Secondary | ICD-10-CM | POA: Diagnosis not present

## 2017-10-07 NOTE — Assessment & Plan Note (Addendum)
He is doing well Has completed ~ 8 weeks of anbx after debridement.  Will stop his anbx now.  I have asked him to call me if he has wound d/c, proximal erythema, worsening pain/tenderness, fever/chills.  Will see him back prn.   I spent a total of 25 minutes with this patient. Greater than 50% of this time was spent counseling and coordinating care for this patient.

## 2017-10-07 NOTE — Progress Notes (Signed)
   Subjective:    Patient ID: Samuel Moran, male    DOB: [birth year], 72 y.o.   MRN: [id number]  HPI 72 y.o. male with hx of Parkinson's, RA (neupro patch, arava). 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 He has had arthritis in this area and now recurrent infections. He had distal amputation ~ 1 year ago and has had another additional surgery.  As f/u he had MRI of his foot 07-13-17: *Findings consistent with osteomyelitis throughout the first metatarsal remnant and majority of the fourth metatarsal remnant. *Somewhat milder degree of edema and enhancement in the proximal 4.2 cm of the fifth metatarsal is also worrisome for osteomyelitis. *Edema and enhancement about the tarsometatarsal joints is worst at the second third and likely degenerative or neuropathic. *Marrow edema and enhancement in the lateral periphery of the cuboid worrisome for osteomyelitis. *Large rim enhancing fluid collections distal to the first and fourth metatarsal remnants most consistent with abscesses.   Prior to adm he had worsening swelling, pain. He had had 2 in office drainage procedures. He had a previous Cx which grew Pseudomonas (pan-sens) on 3-4.  No f/c.  On adm 4-3 he was taken to OR and had 1st MT resection.  He was treated with ceftaz in hospital. He had vanco added when his Cx grew MRSE and Pseudomonas. He was d/c home on these same anbx on 4-8. End date 09-16-17. When he completed these he was changed to po cipro.  He has had podiatry, PCP, neuro f/u since d/c.   He has also been seen by CV for afib. He was treated with eliquis and to return for consideration of cardioversion. He is on monitor for afib.   He feels well, states his wound has been "kept open to drain it". Drainage increases with his level of activity. Still on cipro.    Review of Systems  Constitutional: Negative for appetite change, chills, fever and unexpected weight change.    Gastrointestinal: Negative for constipation and diarrhea.  Genitourinary: Negative for difficulty urinating.  Skin: Positive for wound.       Objective:   Physical Exam  Constitutional: He appears well-developed and well-nourished.  Cardiovascular: An irregularly irregular rhythm present.  Pulmonary/Chest: Effort normal and breath sounds normal.  Abdominal: Soft. Bowel sounds are normal. He exhibits no distension. There is no tenderness.  Musculoskeletal:       Feet:        Assessment & Plan:

## 2017-10-12 ENCOUNTER — Other Ambulatory Visit: Payer: Self-pay | Admitting: Neurology

## 2017-10-12 ENCOUNTER — Telehealth: Payer: Self-pay | Admitting: *Deleted

## 2017-10-12 NOTE — Telephone Encounter (Signed)
[facility] states Dr. [name] would like a medical notification that pt has completed his antibiotic therapy and is ready to begin his rheumatology therapy.

## 2017-10-13 ENCOUNTER — Ambulatory Visit (INDEPENDENT_AMBULATORY_CARE_PROVIDER_SITE_OTHER): Payer: Medicare HMO | Admitting: Podiatry

## 2017-10-13 ENCOUNTER — Encounter: Payer: Self-pay | Admitting: Podiatry

## 2017-10-13 VITALS — Temp 96.6°F

## 2017-10-13 DIAGNOSIS — L97519 Non-pressure chronic ulcer of other part of right foot with unspecified severity: Secondary | ICD-10-CM

## 2017-10-13 DIAGNOSIS — M869 Osteomyelitis, unspecified: Secondary | ICD-10-CM

## 2017-10-13 NOTE — Progress Notes (Signed)
Subjective: 72 year old male presents the office today for his first postoperative visit status post wound excision, first metatarsal osteotomy on the right side.  He states that he is doing well and has not had any drainage or pus and swelling is much improved.  He also relates that he keeps a bandage on it during the day but at 90 open to allow the area dry and this is allowed to help accelerate the healing process.  He is remained on a regular shoe but I think one issue is actually asking to go back to the gym. Denies any calf pain, chest pain, shortness of breath.  He has no other concerns today.  Objective: AAO x3, NAD DP/PT pulses palpable bilaterally, CRT less than 3 seconds On the right that the incision appears to be well coapted.  On the central aspect of the incision continues to be a wound that measures about 0.3 x 0.3 cm this is where the areas been packed open and probes approximately about 0.5 cm.  There is no drainage or pus expressed and there is decrease edema to the area there is no erythema or increase in warmth.  There is no fluctuation or crepitation or any malodor. There is no pain to the foot. No pain with calf compression, swelling, warmth, erythema        Assessment: Osteomyelitis right status post partial first metatarsal ostectomy, improvement   Plan: -All treatment options discussed with the patient including all alternatives, risks, complications.  -At this time there is no drainage or pus coming from the incision.  Continue with daily dressing changes.  I want him to hold off on going to the gym to avoid any pressure to his foot.  He has been off of antibiotics for quite some time and the wound is improving.  We can restart his medications for his rheumatoid arthritis. -Limit activity.  Continue elevation. -Monitor for any clinical signs or symptoms of infection and directed to call the office immediately should any occur or go to the ER. -RTC 2 weeks or sooner  if needed  Trula Slade DPM

## 2017-10-13 NOTE — Telephone Encounter (Signed)
Faxed copy of Dr. Leigh Aurora statement and LOV 10/13/2017 to Dr. [name].

## 2017-10-13 NOTE — Telephone Encounter (Signed)
OK to restart. CAn you fax my note from today to Dr. [name]? Thanks.

## 2017-10-14 ENCOUNTER — Telehealth: Payer: Self-pay | Admitting: *Deleted

## 2017-10-14 NOTE — Telephone Encounter (Signed)
Pt phoned wanting to know about Sleep Apnea referral. Has never gotten a call about it. I didn't see a referral in his chart. Let him know we would get back to him next week. Thanks

## 2017-10-15 NOTE — Telephone Encounter (Signed)
Patient informed of overnight pulse oximetry needing to be scheduled. Patient states he never received a phone call to set this up. Re-faxed all information to [facility], patient informed. Patient verbalized understanding. No further questions.

## 2017-10-17 DIAGNOSIS — M5137 Other intervertebral disc degeneration, lumbosacral region: Secondary | ICD-10-CM | POA: Diagnosis not present

## 2017-10-22 ENCOUNTER — Telehealth: Payer: Self-pay | Admitting: Cardiology

## 2017-10-22 NOTE — Telephone Encounter (Signed)
Patient informed of 24 hour holter monitor results and advised that I spoke with Jeneen Rinks with [facility] who sets up overnight pulse oximetry testing. Jeneen Rinks states he received the fax this morning and will be setting up testing as soon as possible. Advised patient that he would be contacted soon. Patient verbalized understanding. No further questions.

## 2017-10-22 NOTE — Telephone Encounter (Signed)
Wants test results and still hasn't heard anything about his sleep apnea study

## 2017-10-26 DIAGNOSIS — M2550 Pain in unspecified joint: Secondary | ICD-10-CM | POA: Diagnosis not present

## 2017-10-26 DIAGNOSIS — L089 Local infection of the skin and subcutaneous tissue, unspecified: Secondary | ICD-10-CM | POA: Diagnosis not present

## 2017-10-26 DIAGNOSIS — M0579 Rheumatoid arthritis with rheumatoid factor of multiple sites without organ or systems involvement: Secondary | ICD-10-CM | POA: Diagnosis not present

## 2017-10-26 DIAGNOSIS — Z6830 Body mass index (BMI) 30.0-30.9, adult: Secondary | ICD-10-CM | POA: Diagnosis not present

## 2017-10-26 DIAGNOSIS — Z79899 Other long term (current) drug therapy: Secondary | ICD-10-CM | POA: Diagnosis not present

## 2017-10-26 DIAGNOSIS — E669 Obesity, unspecified: Secondary | ICD-10-CM | POA: Diagnosis not present

## 2017-10-27 ENCOUNTER — Ambulatory Visit (INDEPENDENT_AMBULATORY_CARE_PROVIDER_SITE_OTHER): Payer: Medicare HMO | Admitting: Podiatry

## 2017-10-27 DIAGNOSIS — L97519 Non-pressure chronic ulcer of other part of right foot with unspecified severity: Secondary | ICD-10-CM | POA: Diagnosis not present

## 2017-10-27 DIAGNOSIS — Z89411 Acquired absence of right great toe: Secondary | ICD-10-CM | POA: Diagnosis not present

## 2017-10-27 NOTE — Progress Notes (Signed)
Subjective: 72 year old male presents the office today for his first postoperative visit status post wound excision, first metatarsal osteotomy on the right side.  He states that he is doing better.  He has no significant drainage coming from the wound.  He says the wound is but same size.  Denies any increase in swelling to his foot denies any redness or warmth.  He does keep antibiotic ointment and a bandage on the wound on the right foot.  He is interested in getting orthotics at this time.  Denies any recent injury or falls.  He has been going to gym he is only been doing upper body exercises. Denies any calf pain, chest pain, shortness of breath.  He has no other concerns today.  Objective: AAO x3, NAD DP/PT pulses palpable bilaterally, CRT less than 3 seconds On the right that the incision appears to be well coapted.  On the central aspect of the incision continues to be a wound that measures about 0.3 x 0.3 cm however the wound is not probed at the is more superficial.  There is no drainage or pus coming from the area and there is no surrounding erythema, ascending cellulitis.  No fluctuation or crepitation or any malodor.  Swelling is much improved to his foot.  There is no pain. No other open lesions or pre-ulcerative lesion identified bilaterally.  The wound was cleaned today.  This is superficial. No pain with calf compression, swelling, warmth, erythema  Assessment: Osteomyelitis right status post partial first metatarsal ostectomy, improvement   Plan: -All treatment options discussed with the patient including all alternatives, risks, complications.  -The wound was irrigated today in the wound.  It is getting smaller.  We will switch to Iodosorb dressing changes daily and I did give him some of this today.  Continue to limit activity level to the wound is completely healed on the right side.  Today Betha did evaluate him and get him molded for orthotics.  We also discussed possibly custom  shoes. -Monitor for any clinical signs or symptoms of infection and directed to call the office immediately should any occur or go to the ER.  Trula Slade DPM

## 2017-10-29 DIAGNOSIS — M5137 Other intervertebral disc degeneration, lumbosacral region: Secondary | ICD-10-CM | POA: Diagnosis not present

## 2017-11-09 ENCOUNTER — Encounter: Payer: Self-pay | Admitting: *Deleted

## 2017-11-09 ENCOUNTER — Encounter: Payer: Self-pay | Admitting: Cardiology

## 2017-11-09 ENCOUNTER — Telehealth: Payer: Self-pay | Admitting: Neurology

## 2017-11-09 ENCOUNTER — Ambulatory Visit: Payer: Medicare HMO | Admitting: Cardiology

## 2017-11-09 VITALS — BP 130/74 | HR 82 | Ht 71.0 in | Wt 209.8 lb

## 2017-11-09 DIAGNOSIS — G20 Parkinson's disease: Secondary | ICD-10-CM

## 2017-11-09 DIAGNOSIS — I10 Essential (primary) hypertension: Secondary | ICD-10-CM

## 2017-11-09 DIAGNOSIS — I481 Persistent atrial fibrillation: Secondary | ICD-10-CM

## 2017-11-09 DIAGNOSIS — I4819 Other persistent atrial fibrillation: Secondary | ICD-10-CM

## 2017-11-09 DIAGNOSIS — E785 Hyperlipidemia, unspecified: Secondary | ICD-10-CM | POA: Diagnosis not present

## 2017-11-09 NOTE — Patient Instructions (Addendum)
Medication Instructions:  Your physician recommends that you continue on your current medications as directed. Please refer to the Current Medication list given to you today.   Labwork: None  Testing/Procedures: You had an EKG today.  Please contact [facility] to schedule your overnight pulse oximetry testing. The phone number is [phone].  Follow-Up: Your physician recommends that you schedule a follow-up appointment in: 1 month.   If you need a refill on your cardiac medications before your next appointment, please call your pharmacy.   Thank you for choosing [facility]! Robyne Peers, RN [phone]   Oximetry Oximetry is a technology that measures the oxygen level in the blood. This measurement is taken with a device called an oximeter. This device may also measure the heart rate (pulse). The measurement helps to assess:  A person's oxygen level and breathing.  The need or effectiveness of oxygen therapy or other treatments for lung disease.  A person's ability to tolerate increased activity.  If a more accurate measurement is required, a blood sample will be taken. How is an oximetry reading obtained?  A tape sensor or clip with a light source and a light detector will be placed on an area of the body: ? For children and adults, the sensor is usually placed on areas such as a finger, earlobe, or toe. ? For infants, a tape sensor is usually placed around areas such as the sole of a foot or the palm of a hand.  The device will beam light through the skin and blood. This cannot be felt.  The levels of light received by the detector will be measured and the percentage of blood cells carrying oxygen will be calculated. Oximetry may be used continuously or may be used at specified intervals. Are there any risks associated with oximetry? The risks associated with oximetry are rare. The lights do not burn or hurt your skin. However, there is a risk of  skin breakdown if a sensor is left in the same location for long periods of time. What can affect the accuracy of the oximetry reading? Certain factors or conditions can affect the accuracy of the measurements. They include:  Extreme warmth or coolness of the area where the sensor is located.  Excessive sweating of the area where the sensor is located.  Shivering or too much movement.  Poor blood flow to the area where the sensor is located.  Low hemoglobin or red blood cell levels (anemia).  A bone marrow disease that causes high levels of red blood cells, white blood cells, and platelets (polycythemia vera).  Chronic smoking and recent inhalation of smoke or carbon monoxide.  Bright, artificial lighting.  Nail polish or artificial nails.  Very dark skin.  What is the meaning of the oximetry reading? The normal value depends upon your medical history and your elevation above sea level.  Normal oxygen saturation levels are 90% or higher. Most healthy people have oxygen saturation levels between 95% and 100%.  Low oxygen saturation levels are below 90%. This may happen in people with lung conditions, such as chronic obstructive pulmonary disease (COPD). In this case, supplemental oxygen therapy may be needed.  Summary  Oximetry uses a small device to measure the oxygen level in the blood.  The light in the sensor cannot be felt. The risks associated with oximetry are very rare.  Normal oxygen levels are 90% or higher. Most healthy people have oxygen levels between 95% and 100%.  People with low oxygen levels  may need supplemental oxygen. This information is not intended to replace advice given to you by your health care provider. Make sure you discuss any questions you have with your health care provider. Document Released: 07/10/2004 Document Revised: 04/24/2016 Document Reviewed: 04/24/2016 Elsevier Interactive Patient Education  2017 [employer].

## 2017-11-09 NOTE — Telephone Encounter (Signed)
Patient states he needs a refill on medication carbidopa-levodopa sent to [facility] pharmacy.

## 2017-11-09 NOTE — Telephone Encounter (Signed)
Carbidopa Levodopa refill sent 10/12/17 for 90 day supply with one refill.  Patient made aware.

## 2017-11-09 NOTE — Progress Notes (Addendum)
Cardiology Office Note:    Date:  11/09/2017   ID:  Samuel Moran, DOB [birth year], MRN [id number]   PCP:  [name], MD  Cardiologist:  Jenne Campus, MD    Referring MD: [name], MD   Chief Complaint  Patient presents with  . 1 month follow up  Doing well  History of Present Illness:    Samuel Moran is a 72 y.o. male with persistent atrial fibrillation, Parkinson's disease, osteomyelitis overall he is feeling better get more energy goes to gym on the regular basis.  Comes today to my office to talk about potential cardioversion.  Denies have any chest pain tightness squeezing pressure mentions no palpitations.  He did wear Holter monitor to check heart rate variability and his ventricular rate is controlled.  With talking length today about cardioversion.  He is willing to try.  I told him that the only potential benefits of weight will be to make him feel better.  He does have multiple reasons for not feeling good including Parkinson's osteomyelitis that likely is much better.  He said that he is willing to proceed with cardioversion.  Still an answer question is if he does have sleep apnea.  We will schedule him to have pulse oximetry if past oximetry will be normal then we will schedule him to have cardioversion.  Past Medical History:  Diagnosis Date  . Atrial fibrillation ([place])    onset 07/17/17  . Dysrhythmia   . ED (erectile dysfunction)   . GERD 07/01/2007  . Parkinson's disease ([place])   . 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 PROSTATE SPECIFIC ANTIGEN, ELEVATED 06/29/2007  . Rheumatoid arthritis ([place])   . SEIZURE DISORDER 07/01/2007   one epsiode only  . Spinal stenosis of lumbar region 09/22/2014    Past Surgical History:  Procedure Laterality Date  . AMPUTATION     right great toe - tramatic age 28  . COLONOSCOPY    . DEBRIDEMENT TOE Right 07/2016  . HAND SURGERY  03/2017   removal cysts  . IR FLUORO GUIDE CV LINE RIGHT  10/16/2016  .  IR US GUIDE VASC ACCESS RIGHT  10/16/2016  . OSTECTOMY Right 08/05/2017   Procedure: OSTECTOMY COMPLETE METARSAL HEAD FIRST RIGHT;  Surgeon: Trula Slade, DPM;  Location: [facility];  Service: Podiatry;  Laterality: Right;    Current Medications: Current Meds  Medication Sig  . apixaban (ELIQUIS) 5 MG TABS tablet Take 1 tablet (5 mg total) by mouth 2 (two) times daily.  . carbidopa-levodopa (SINEMET IR) 25-100 MG tablet TAKE 1 TABLET BY MOUTH THREE TIMES A DAY  . furosemide (LASIX) 20 MG tablet Take 1 tablet (20 mg total) by mouth daily.  . metoprolol succinate (TOPROL-XL) 50 MG 24 hr tablet Take 1 tablet (50 mg total) by mouth daily. Take with or immediately following a meal.  . Multiple Vitamins-Minerals (CENTRUM SILVER PO) Take 1 tablet by mouth daily.   Marland Kitchen NEUPRO 4 MG/24HR PLACE 1 PATCH ONTO THE SKIN DAILY.     Allergies:   Patient has no known allergies.   Social History   Socioeconomic History  . Marital status: Single    Spouse name: Not on file  . Number of children: Not on file  . Years of education: Not on file  . Highest education level: Not on file  Occupational History  . Occupation: Chartered certified accountant  Social Needs  . Financial resource strain: Not on file  . Food insecurity:  Worry: Not on file    Inability: Not on file  . Transportation needs:    Medical: Not on file    Non-medical: Not on file  Tobacco Use  . Smoking status: Light Tobacco Smoker    Types: Cigars  . Smokeless tobacco: Never Used  . Tobacco comment: 1 cigar, takes 3 weeks  Substance and Sexual Activity  . Alcohol use: Yes    Alcohol/week: 2.4 oz    Types: 4 Standard drinks or equivalent per week    Comment: one drink a day (liquor)  . Drug use: No  . Sexual activity: Not on file  Lifestyle  . Physical activity:    Days per week: Not on file    Minutes per session: Not on file  . Stress: Not on file  Relationships  . Social connections:    Talks on phone: Not on file    Gets together:  Not on file    Attends religious service: Not on file    Active member of club or organization: Not on file    Attends meetings of clubs or organizations: Not on file    Relationship status: Not on file  Other Topics Concern  . Not on file  Social History Narrative  . Not on file     Family History: The patient's family history includes Alcoholism in his mother; Arthritis in his father and mother. There is no history of Colon cancer, Esophageal cancer, Rectal cancer, or Stomach cancer. ROS:   Please see the history of present illness.    All 14 point review of systems negative except as described per history of present illness  EKGs/Labs/Other Studies Reviewed:    EKG done today show atrial fibrillation with controlled ventricular rate normal QS complex duration morphology  Recent Labs: 02/26/2017: ALT 14; TSH 1.59 08/05/2017: Magnesium 1.9 08/09/2017: BUN 12; Creatinine, Ser 0.90; Hemoglobin 10.8; Platelets 410; Potassium 3.7; Sodium 137  Recent Lipid Panel    Component Value Date/Time   CHOL 168 02/26/2017 0922   TRIG 55.0 02/26/2017 0922   HDL 52.90 02/26/2017 0922   CHOLHDL 3 02/26/2017 0922   VLDL 11.0 02/26/2017 0922   LDLCALC 104 (H) 02/26/2017 0922    Physical Exam:    VS:  BP 130/74   Pulse 82   Ht 5\' 11"  (1.803 m)   Wt 209 lb 12.8 oz (95.2 kg)   SpO2 98%   BMI 29.26 kg/m     Wt Readings from Last 3 Encounters:  11/09/17 209 lb 12.8 oz (95.2 kg)  10/07/17 220 lb (99.8 kg)  10/05/17 216 lb 12.8 oz (98.3 kg)     GEN:  Well nourished, well developed in no acute distress HEENT: Normal NECK: No JVD; No carotid bruits LYMPHATICS: No lymphadenopathy CARDIAC: Irregularly irregular, no murmurs, no rubs, no gallops RESPIRATORY:  Clear to auscultation without rales, wheezing or rhonchi  ABDOMEN: Soft, non-tender, non-distended MUSCULOSKELETAL:  No edema; No deformity  SKIN: Warm and dry LOWER EXTREMITIES: no swelling NEUROLOGIC:  Alert and oriented x  3 PSYCHIATRIC:  Normal affect   ASSESSMENT:    1. Persistent atrial fibrillation ([place])   2. Essential hypertension   3. Dyslipidemia   4. Parkinson disease ([place])    PLAN:    In order of problems listed above:  1. Persistent atrial fibrillation: Anticoagulated with Eliquis appropriate dose 5 mg twice daily which I will continue Essential hypertension blood pressure well controlled continue present management. Dyslipidemia will get fasting lipid profile from primary  care physician Parkinson disease: Stable continue present management   Medication Adjustments/Labs and Tests Ordered: Current medicines are reviewed at length with the patient today.  Concerns regarding medicines are outlined above.  No orders of the defined types were placed in this encounter.  Medication changes: No orders of the defined types were placed in this encounter.  Patient ended up having overnight pulse oximetry to see if there is any possibility of sleep apnea that could be trigger for his atrial fibrillation is past oximetry overnight shows significant drop in her saturation to 72%.  He will be scheduled to have sleep study with potential titration.  Signed, Park Liter, MD, [facility] 11/09/2017 9:39 AM    [place]

## 2017-11-10 ENCOUNTER — Ambulatory Visit (INDEPENDENT_AMBULATORY_CARE_PROVIDER_SITE_OTHER): Payer: Medicare HMO | Admitting: Podiatry

## 2017-11-10 ENCOUNTER — Ambulatory Visit (INDEPENDENT_AMBULATORY_CARE_PROVIDER_SITE_OTHER): Payer: Medicare HMO

## 2017-11-10 ENCOUNTER — Telehealth: Payer: Self-pay | Admitting: Cardiology

## 2017-11-10 DIAGNOSIS — L97519 Non-pressure chronic ulcer of other part of right foot with unspecified severity: Secondary | ICD-10-CM

## 2017-11-10 MED ORDER — CIPROFLOXACIN HCL 500 MG PO TABS: 500.0000 mg | ORAL_TABLET | Freq: Two times a day (BID) | ORAL | 0 refills | Status: DC

## 2017-11-10 MED ORDER — CLINDAMYCIN HCL 300 MG PO CAPS: 300.0000 mg | ORAL_CAPSULE | Freq: Three times a day (TID) | ORAL | 2 refills | Status: DC

## 2017-11-10 NOTE — Telephone Encounter (Signed)
Patient informed us that he went to [employer] and got overnight pulse ox equipment as requested by doctor.

## 2017-11-10 NOTE — Progress Notes (Signed)
  Subjective: 72 year old male presents the office today for his first postoperative visit status post wound excision, first metatarsal osteotomy on the right side.  Denies any recent trauma.  He is currently not taking any antibiotics.  No other concerns. He states the wound is doing well.  Is been more active moving to the gym.  He has noticed a blister to the bottom of his foot form is been getting larger.  He denies any drainage or pus.  He denies any increase in swelling to the foot denies any redness or warmth. Denies any calf pain, chest pain, shortness of breath.  He has no other concerns today.  Objective: AAO x3, NAD DP/PT pulses palpable bilaterally, CRT less than 3 seconds On the right that the incision appears to be well coapted.  The wound is very superficial still measuring approximate 0.2 x 0.2 cm but is not probed.  There is no undermining or tunneling.  There is a new blister, hyperkeratotic lesion on the right foot submetatarsal area, see pictures below.  Upon debridement as there was clear, yellow drainage expressed but there is no purulence.  The wound does probe approximately 2 cm but does not probe to bone.  There is no areas of fluctuation or crepitation.  Normal temperature gradient of the foot. 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 -X-rays were obtained and reviewed.  There is some periosteal reaction of the second metatarsal head on the AP view but no other evidence of acute osteomyelitis is identified. -The left knee surgery.  She was completely healed at this time.  However there is a new blister, hyperkeratotic lesion submetatarsal area.  Upon debridement of this today there was quite a bit of yellow to clear  drainage coming from the area but there is no purulence.  I irrigated the area today.  Packing was applied followed by dry sterile dressing to continue this at home.  Continue with a Darco wedge shoe as well.  Prescribed clindamycin and ciprofloxacin.  Recommend to hold off on going to gym and increase activity.  Elevation. Monitor for any clinical signs or symptoms of infection and directed to call the office immediately should any occur or go to the ER. -Also we discussed the transmetatarsal amputation again today but he does not want to proceed with this.  Trula Slade DPM

## 2017-11-11 ENCOUNTER — Telehealth: Payer: Self-pay | Admitting: Cardiology

## 2017-11-11 NOTE — Telephone Encounter (Signed)
Unable to leave message on cell phone. Left message to return call on home phone.

## 2017-11-11 NOTE — Telephone Encounter (Signed)
Patient reports doing sleep test last night and machine being picked up today. Not available July 18 thru July 23 for procedures. Also August 1-7 he will be gone.

## 2017-11-12 NOTE — Telephone Encounter (Signed)
Called patient could not leave voicemail will call again tomorrow to see if this is done.

## 2017-11-12 NOTE — Telephone Encounter (Signed)
Patient returned call and wanted to let me know that he had completed the overnight oximetry test and wanted to make [place] aware of the dates he would not be able to schedule the cardioversion for. Advised patient that I would make Dr. Agustin Cree aware and update him once Dr. Agustin Cree is able to review overnight oximetry test results. Patient verbalized understanding. No further questions.

## 2017-11-13 ENCOUNTER — Ambulatory Visit: Payer: Medicare HMO | Admitting: Podiatry

## 2017-11-13 NOTE — Telephone Encounter (Signed)
Called again with no ability to leave a voicemail.

## 2017-11-17 ENCOUNTER — Ambulatory Visit: Payer: Medicare HMO | Admitting: Orthotics

## 2017-11-17 ENCOUNTER — Ambulatory Visit: Payer: Medicare HMO | Admitting: Podiatry

## 2017-11-17 DIAGNOSIS — R609 Edema, unspecified: Secondary | ICD-10-CM

## 2017-11-17 DIAGNOSIS — L97519 Non-pressure chronic ulcer of other part of right foot with unspecified severity: Secondary | ICD-10-CM

## 2017-11-17 DIAGNOSIS — L02611 Cutaneous abscess of right foot: Secondary | ICD-10-CM

## 2017-11-17 MED ORDER — CIPROFLOXACIN HCL 500 MG PO TABS: 500.0000 mg | ORAL_TABLET | Freq: Two times a day (BID) | ORAL | 0 refills | Status: DC

## 2017-11-17 MED ORDER — CLINDAMYCIN HCL 300 MG PO CAPS: 300.0000 mg | ORAL_CAPSULE | Freq: Three times a day (TID) | ORAL | 2 refills | Status: DC

## 2017-11-17 NOTE — Progress Notes (Signed)
Patient picked up f/o w/ toe filler; TF needed minimal adjustment.

## 2017-11-18 NOTE — Progress Notes (Signed)
  Subjective: 72 year old male presents the office today for his first postoperative visit status post wound excision, first metatarsal osteotomy on the right side.  He presents today wearing a regular shoe although I recommended a Darco wedge shoe with your new home.  He denies any systemic complaints including fevers, chills, nausea, vomiting.  Denies any calf pain, chest pain, shortness of breath.  Overall he just appears frustrated with his multiple medical conditions at this point.  He is going out of town to [place] this week to [employer].  He has no other concerns today.  Objective: AAO x3, NAD DP/PT pulses palpable bilaterally, CRT less than 3 seconds On the right that the incision appears to be well coapted.  The wounds of the surgical site appears feels completely healed at this time.  The wound to the bottom aspect the foot submetatarsal 2, 3 areas appear to be measuring about the same size however is not improving quite deep today.  There is only very minimal clear drainage there is no pus.  There is no probing to bone, and or tunneling there is no significant swelling erythema there is no ascending cellulitis.  No fluctuation or crepitation or any malodor.  No other open lesions or pre-ulcerative lesions are identified at this time.  There does appear to be prominence of metatarsal remnants plantarly to the second and third.  No pain with calf compression, swelling, warmth, erythema  Assessment: Osteomyelitis right status post partial first metatarsal ostectom with significant improvement however with ulceration submetatarsal area with improvement  Plan: -All treatment options discussed with the patient including all alternatives, risks, complications. -Overall the wound appears to mildly improved is significantly improved drainage to the area.  Continue with saline cleansing of the area followed by Iodosorb packing and dry sterile dressing.  I have continue stressed with him  nonweightbearing or at least putting the Darco wedge shoe on but he continues to wear regular shoe.  Is going out of this week and he does not want to wear the Darco shoe.  He is going to the airport I want him to use assistance to get him to and from the plane.  He at least agrees to this.  We can discuss the transmetatarsal amputation which I think ultimately be more beneficial for him. -Finish course of antibiotics. -Monitor for any clinical signs or symptoms of infection and directed to call the office immediately should any occur or go to the ER.  Trula Slade DPM

## 2017-11-23 NOTE — Telephone Encounter (Signed)
Sent a faxed letter request for the patient's overnight pulse ox results from [place].

## 2017-11-24 ENCOUNTER — Other Ambulatory Visit: Payer: Self-pay | Admitting: *Deleted

## 2017-11-24 DIAGNOSIS — Z9189 Other specified personal risk factors, not elsewhere classified: Secondary | ICD-10-CM

## 2017-11-25 ENCOUNTER — Other Ambulatory Visit: Payer: Self-pay | Admitting: Podiatry

## 2017-11-25 ENCOUNTER — Other Ambulatory Visit: Payer: Self-pay | Admitting: Family Medicine

## 2017-11-26 ENCOUNTER — Ambulatory Visit: Payer: Medicare HMO | Admitting: Podiatry

## 2017-11-26 DIAGNOSIS — D649 Anemia, unspecified: Secondary | ICD-10-CM | POA: Diagnosis not present

## 2017-11-26 NOTE — Telephone Encounter (Signed)
Informed patient of overnight pulse oximetry results and advised that Dr. Agustin Cree has recommended patient have a sleep study before proceeding with cardioversion. Patient verbalized understanding. Informed him that I am waiting on insurance approval before I can schedule the sleep study. Patient is going to be out of town 12/03/17-12/09/17 so he cannot have sleep study done then. No further questions.

## 2017-11-26 NOTE — Telephone Encounter (Signed)
Received electronic refill request for Cipro. Unable to leave a message requesting callback to discuss symptoms and refill.

## 2017-11-26 NOTE — Telephone Encounter (Signed)
Dr.Fry's pt

## 2017-11-27 ENCOUNTER — Telehealth: Payer: Self-pay | Admitting: *Deleted

## 2017-11-27 NOTE — Telephone Encounter (Signed)
Pt states he is coming along fine, but may need the cipro just to make sure. Pt has an appt on Monday. Dr. Berton Lan the refill.

## 2017-11-27 NOTE — Telephone Encounter (Signed)
PA submitted for in lab sleep study to [facility] web portal.

## 2017-11-30 ENCOUNTER — Telehealth: Payer: Self-pay | Admitting: *Deleted

## 2017-11-30 NOTE — Telephone Encounter (Signed)
Additional requested clinical office notes and ONO results faxed to [facility].

## 2017-12-01 ENCOUNTER — Encounter: Payer: Self-pay | Admitting: Podiatry

## 2017-12-01 ENCOUNTER — Ambulatory Visit: Payer: Medicare HMO | Admitting: Podiatry

## 2017-12-01 ENCOUNTER — Ambulatory Visit (INDEPENDENT_AMBULATORY_CARE_PROVIDER_SITE_OTHER): Payer: Medicare HMO

## 2017-12-01 DIAGNOSIS — L97519 Non-pressure chronic ulcer of other part of right foot with unspecified severity: Secondary | ICD-10-CM | POA: Diagnosis not present

## 2017-12-01 DIAGNOSIS — L02611 Cutaneous abscess of right foot: Secondary | ICD-10-CM | POA: Diagnosis not present

## 2017-12-02 ENCOUNTER — Telehealth: Payer: Self-pay | Admitting: *Deleted

## 2017-12-02 NOTE — Progress Notes (Signed)
  Subjective: 72 year old male presents the office today for evaluation of a wound on the bottom of his right foot.  He states since the last saw him he has been very active and doing a lot of walking.  He recently was then the [place] visiting friends and going to [place].  He had a lot of difficulty traveling to the cancellation delays and he did a lot of walking.  Also his friend that he was staying with a 3 story house and is on his feet quite a bit and states it is cumbersome to wear the surgical shoe so he was not doing this.  He states he has had some drainage from the area denies any increase in swelling or redness.  Denies any increase in pain.  He has no other concerns.  He denies any systemic complaints including fevers, chills, nausea, vomiting.  No calf pain, chest pain, shortness of breath.  No other concerns.  Objective: AAO x3, NAD DP/PT pulses palpable bilaterally, CRT less than 3 seconds On the plantar aspect of the foot and there is purulent drainage identified today and there is mild increase in edema and some faint erythema to the area but there is no ascending cellulitis or increase in warmth of the foot.  There is no fluctuation or crepitation.  Unable to probe directly close to the second metatarsal.  The wound itself measures approximate 0.8 x 0.8 cm.  There is no other open lesions identified.  There is prominence of metatarsal head remnant. No pain with calf compression, swelling, warmth, erythema  Assessment: Healed wound status post right first metatarsal ostectomy with new ulceration submetatarsal area  Plan: -All treatment options discussed with the patient including all alternatives, risks, complications. -Repeat x-rays were obtained reviewed which did not reveal any soft tissue emphysema is no definitive cortical destruction suggestive of osteomyelitis at this time.We long discussion today regarding treatment options.  Ultimately I do think he would benefit from a  transmetatarsal mutation but he does not want this done.  Discussed resection of the second third metatarsal head to help even out the parabola to help take pressure off the area.  I would recommend in the short-term however he does not want to have this done.  He states that he is interested operative foot more and try to elevate.  Discussed the importance of systems along with the infection goes on he continues more than just the foot clinic and possibly was a left foot infection spread.  He is very well aware of this but he states he is a lot upcoming events he does not want this. -Wound culture was obtained today and group was given to [place], [place].  For now we are going to continue with the current antibiotics.  Continue with offloading at all times.  Wear a Darco wedge shoe and elevation.  Would prefer him to be nonweightbearing but he does not do this. -Monitor for any clinical signs or symptoms of infection and directed to call the office immediately should any occur or go to the ER.  Trula Slade DPM

## 2017-12-02 NOTE — Telephone Encounter (Signed)
Staff message sent to [facility] approval received . Ok to schedule sleep study.

## 2017-12-02 NOTE — Telephone Encounter (Signed)
Patient informed that sleep study has been scheduled for Sunday, 12/27/17 at 8 pm at [facility]. Address provided to patient and informed him that he would be receiving additional information in the mail. Patient verbalized understanding. No further questions.

## 2017-12-02 NOTE — Telephone Encounter (Signed)
-----   Message from Austin Miles, RN sent at 11/24/2017  5:02 PM EDT ----- Regarding: Please precert Please precert this patient for a sleep study. Patient is a risk for sleep apnea due to an overnight pulse oximetry study indicating his oxygen level dropped to 72%. Thanks!

## 2017-12-04 ENCOUNTER — Telehealth: Payer: Self-pay

## 2017-12-04 LAB — WOUND CULTURE
MICRO NUMBER: 90899438
SPECIMEN QUALITY: ADEQUATE

## 2017-12-04 NOTE — Telephone Encounter (Signed)
Tried to call patient twice to obtain an updated pharmacy since patient.  Update his orders per Dr. Jacqualyn Posey are as follows: Discontinue taking Cipro, continue taking clindamycin as prescribed.  New order for Augmentin 875 mg twice daily for 2 weeks is to be sent to patient's pharmacy.  Patient is currently out of town in [place], and when I attempted to call his phone I was unable to leave a voicemail.  New prescription for Augmentin will need to be called in to a pharmacy near the patient.

## 2017-12-06 ENCOUNTER — Other Ambulatory Visit: Payer: Self-pay | Admitting: Neurology

## 2017-12-09 ENCOUNTER — Ambulatory Visit: Payer: Medicare HMO | Admitting: Neurology

## 2017-12-09 ENCOUNTER — Other Ambulatory Visit: Payer: Self-pay | Admitting: Podiatry

## 2017-12-10 ENCOUNTER — Ambulatory Visit (INDEPENDENT_AMBULATORY_CARE_PROVIDER_SITE_OTHER): Payer: Medicare HMO | Admitting: Podiatry

## 2017-12-10 DIAGNOSIS — M869 Osteomyelitis, unspecified: Secondary | ICD-10-CM | POA: Diagnosis not present

## 2017-12-10 DIAGNOSIS — L02611 Cutaneous abscess of right foot: Secondary | ICD-10-CM

## 2017-12-10 MED ORDER — AMOXICILLIN-POT CLAVULANATE 875-125 MG PO TABS: 1.0000 | ORAL_TABLET | Freq: Two times a day (BID) | ORAL | 0 refills | Status: DC

## 2017-12-11 ENCOUNTER — Telehealth: Payer: Self-pay | Admitting: Podiatry

## 2017-12-11 NOTE — Telephone Encounter (Signed)
Patient was prescribed a antibiotic yesterday and wanted to know if he needed to continue taking thaqt with the other medications as well.

## 2017-12-11 NOTE — Telephone Encounter (Signed)
Left message informing pt Dr. Jacqualyn Posey only wanted him to take the Augmentin.

## 2017-12-11 NOTE — Telephone Encounter (Signed)
He should take only the augmentin.

## 2017-12-13 ENCOUNTER — Other Ambulatory Visit: Payer: Self-pay | Admitting: Family Medicine

## 2017-12-14 ENCOUNTER — Encounter: Payer: Self-pay | Admitting: Podiatry

## 2017-12-14 ENCOUNTER — Ambulatory Visit: Payer: Medicare HMO | Admitting: Podiatry

## 2017-12-14 ENCOUNTER — Ambulatory Visit (INDEPENDENT_AMBULATORY_CARE_PROVIDER_SITE_OTHER): Payer: Medicare HMO

## 2017-12-14 VITALS — Temp 97.4°F

## 2017-12-14 DIAGNOSIS — M869 Osteomyelitis, unspecified: Secondary | ICD-10-CM | POA: Diagnosis not present

## 2017-12-14 DIAGNOSIS — L02611 Cutaneous abscess of right foot: Secondary | ICD-10-CM

## 2017-12-14 NOTE — Patient Instructions (Signed)

## 2017-12-14 NOTE — Progress Notes (Signed)
  Subjective: 72 year old male presents the office today for evaluation of a wound on the bottom of his right foot.  He recently just got back from vacation again.  He states he has been trying the office because most possible but he has done well walking.  He still taking antibiotics as prescribed.  To try call him on several occasions to change his antibiotics but his voicemail was full we could not reach him.  He states that he is still noticed some bleeding coming from the wound but denies any significant pus.  He is try to wear the surgical shoe as much as possible with a wedge.  He has no other concerns today.  Overall he feels well he denies any systemic complaints including fevers, chills, nausea, vomiting.  No calf pain, chest pain, shortness of breath.  No other concerns.  Objective: AAO x3, NAD DP/PT pulses palpable bilaterally, CRT less than 3 seconds On the plantar aspect of the foot small amount of superficial purulent drainage identified however unable to protrude beyond the second metatarsal today and touch bone.  There is mild swelling to the foot as well as some faint erythema but overall this appears to be about the same.  There is no significant increase in warmth of the foot.  There is no pain to the area.  There is no area of fluctuation or crepitation is no malodor. No pain with calf compression, swelling, warmth, erythema  Assessment: Ulceration right foot with evidence of myelitis  Plan: -All treatment options discussed with the patient including all alternatives, risks, complications. -At this point I discussed with him surgical intervention.  Again discussed transmetatarsal amputation he does not want to have this done.  Discussed with him wound debridement, excision with resection of the second third metatarsal remnants.  I do think that trying to get some of the pressure off this area will be helpful to try to even out the metatarsal parabola will be helpful.  Also this  will help get rid of the bone but unable to probe.  He was told him to October to do this but I strongly encouraged him not to monitor this goes on there is a chance of more proximal amputation.  After discussion he does agree to do this next week.  We will see him back on Monday for x-rays as well as surgical consent.  Most which is a box of Augmentin.  Discussed with him there is any signs or symptoms of worsening infection so medially to the emergency room.  Trula Slade DPM

## 2017-12-15 NOTE — Progress Notes (Signed)
  Subjective: 72 year old male presents the office today for evaluation of a wound on the bottom of his right foot.  He is switched to taking Augmentin is not having side effects the medication.  He does report the swelling may be somewhat better and he noticed some blood coming from the wound site but denies any significant pus.  Denies any increase in warmth or redness to his foot.  He has been changing the wound daily and his friend is been helping him do this.  He has no other concerns today.  No pain. He denies any systemic complaints including fevers, chills, nausea, vomiting.  No calf pain, chest pain, shortness of breath.  No other concerns.  Objective: AAO x3, NAD DP/PT pulses palpable bilaterally, CRT less than 3 seconds On the plantar aspect of the foot on the plantar ulceration there is some bloody drainage identified but there is no significant purulence today but still able to probe gently down the metatarsal.  There is no fluctuation or crepitation.  There is swelling directly to this area but there is no significant increase in warmth.  There is faint erythema this could be result from the swelling.  There is no malodor.  No ascending cellulitis.  No other areas of tenderness.  Overall the wound measures about the same.  No new ulcerations are present. No pain with calf compression, swelling, warmth, erythema  Assessment: Healed wound status post right first metatarsal ostectomy with new ulceration submetatarsal area  Plan: -All treatment options discussed with the patient including all alternatives, risks, complications. -Repeat x-rays were obtained reviewed.  On the third metatarsal there is some swelling present the distal portion of metatarsal concerning for early osteomyelitis there is no definitive cortical destruction present.  No soft tissue emphysema is present. -At this time I discussed with him again surgical intervention.  He does not want to have a transmetatarsal  amputation.  I discussed that resection of portions of the second third metatarsal to limit the pressure as well as for likely infection.  We discussed the surgical postoperative course this is not a guarantee resolution of symptoms.  After discussion he agrees with the surgery and wishes to proceed.  This is scheduled for this Wednesday. -The incision placement as well as the postoperative course was discussed with the patient. I discussed risks of the surgery which include, but not limited to, infection, bleeding, pain, swelling, need for further surgery, delayed or nonhealing, painful or ugly scar, numbness or sensation changes, over/under correction, recurrence, transfer lesions, further deformity, hardware failure, DVT/PE, loss of toe/foot. Patient understands these risks and wishes to proceed with surgery. The surgical consent was reviewed with the patient all 3 pages were signed. No promises or guarantees were given to the outcome of the procedure. All questions were answered to the best of my ability. Before the surgery the patient was encouraged to call the office if there is any further questions. The surgery will be performed at the [facility] on an outpatient basis.  Trula Slade DPM

## 2017-12-16 ENCOUNTER — Other Ambulatory Visit: Payer: Self-pay | Admitting: Podiatry

## 2017-12-16 ENCOUNTER — Telehealth: Payer: Self-pay | Admitting: Podiatry

## 2017-12-16 DIAGNOSIS — L89511 Pressure ulcer of right ankle, stage 1: Secondary | ICD-10-CM

## 2017-12-16 DIAGNOSIS — Z008 Encounter for other general examination: Secondary | ICD-10-CM | POA: Diagnosis not present

## 2017-12-16 DIAGNOSIS — M898X7 Other specified disorders of bone, ankle and foot: Secondary | ICD-10-CM | POA: Diagnosis not present

## 2017-12-16 DIAGNOSIS — S92341K Displaced fracture of fourth metatarsal bone, right foot, subsequent encounter for fracture with nonunion: Secondary | ICD-10-CM

## 2017-12-16 DIAGNOSIS — L97514 Non-pressure chronic ulcer of other part of right foot with necrosis of bone: Secondary | ICD-10-CM | POA: Diagnosis not present

## 2017-12-16 DIAGNOSIS — K219 Gastro-esophageal reflux disease without esophagitis: Secondary | ICD-10-CM | POA: Diagnosis not present

## 2017-12-16 DIAGNOSIS — M86171 Other acute osteomyelitis, right ankle and foot: Secondary | ICD-10-CM | POA: Diagnosis not present

## 2017-12-16 DIAGNOSIS — S92351K Displaced fracture of fifth metatarsal bone, right foot, subsequent encounter for fracture with nonunion: Secondary | ICD-10-CM

## 2017-12-16 DIAGNOSIS — M86061 Acute hematogenous osteomyelitis, right tibia and fibula: Secondary | ICD-10-CM | POA: Diagnosis not present

## 2017-12-16 MED ORDER — AMOXICILLIN-POT CLAVULANATE 875-125 MG PO TABS: 1.0000 | ORAL_TABLET | Freq: Two times a day (BID) | ORAL | 0 refills | Status: DC

## 2017-12-16 MED ORDER — HYDROCODONE-ACETAMINOPHEN 5-325 MG PO TABS: 1.0000 | ORAL_TABLET | ORAL | 0 refills | Status: DC | PRN

## 2017-12-16 NOTE — Progress Notes (Signed)
Postop pain medications sent to the pharmacy

## 2017-12-16 NOTE — Telephone Encounter (Signed)
This is Samuel Moran, [employer] on behalf of Jakeel Starliper. He had surgery this morning with Dr. Jacqualyn Posey and Dr. Jacqualyn Posey was going to call in a prescription for pain medication, and an antibiotic for Augmentin. The pharmacy still has not received anything and he is now starting to experience some pain. I'm looking to get something for the pain. If I could please get a call back as soon as possible so he can get some pain medication. I can be reached at [phone]. Thank you.

## 2017-12-16 NOTE — Telephone Encounter (Signed)
It has been sent. Please let him know. Thanks.

## 2017-12-16 NOTE — Telephone Encounter (Signed)
Pt friend called in and stated that he was suppose to have 2 prescriptions at the pharmacy today after surgery and they have no record of Dr. Jacqualyn Posey calling it in. Patient is in pain. Please call

## 2017-12-16 NOTE — Telephone Encounter (Signed)
Spoke with Malachy Mood (patient"s Friend) and stated that Dr Jacqualyn Posey just sent over the RX's to the pharmacy. Lattie Haw

## 2017-12-16 NOTE — Telephone Encounter (Signed)
I called Malachy Mood back to let her know that Dr. Jacqualyn Posey sent Mr. Pompey medications to the pharmacy. Malachy Mood stated she had spoken to Tobias already. I told her to call us with any other questions or concerns.

## 2017-12-16 NOTE — Telephone Encounter (Signed)
Left a message for Dr Jacqualyn Posey. Lattie Haw

## 2017-12-18 ENCOUNTER — Ambulatory Visit (INDEPENDENT_AMBULATORY_CARE_PROVIDER_SITE_OTHER): Payer: Medicare HMO | Admitting: Podiatry

## 2017-12-18 ENCOUNTER — Ambulatory Visit: Payer: Medicare HMO

## 2017-12-18 ENCOUNTER — Encounter: Payer: Self-pay | Admitting: Podiatry

## 2017-12-18 VITALS — BP 125/74 | HR 88 | Temp 98.4°F | Resp 18

## 2017-12-18 DIAGNOSIS — M545 Low back pain, unspecified: Secondary | ICD-10-CM | POA: Insufficient documentation

## 2017-12-18 DIAGNOSIS — L02611 Cutaneous abscess of right foot: Secondary | ICD-10-CM

## 2017-12-18 DIAGNOSIS — M5416 Radiculopathy, lumbar region: Secondary | ICD-10-CM | POA: Diagnosis not present

## 2017-12-18 DIAGNOSIS — M869 Osteomyelitis, unspecified: Secondary | ICD-10-CM

## 2017-12-18 DIAGNOSIS — M5137 Other intervertebral disc degeneration, lumbosacral region: Secondary | ICD-10-CM | POA: Diagnosis not present

## 2017-12-21 ENCOUNTER — Ambulatory Visit (INDEPENDENT_AMBULATORY_CARE_PROVIDER_SITE_OTHER): Payer: Medicare HMO | Admitting: Podiatry

## 2017-12-21 ENCOUNTER — Ambulatory Visit: Payer: Medicare HMO

## 2017-12-21 VITALS — BP 158/98 | HR 82

## 2017-12-21 DIAGNOSIS — M869 Osteomyelitis, unspecified: Secondary | ICD-10-CM

## 2017-12-21 DIAGNOSIS — L02611 Cutaneous abscess of right foot: Secondary | ICD-10-CM

## 2017-12-21 NOTE — Progress Notes (Signed)
Subjective: Samuel Moran is a 72 y.o. is seen today in office s/p right foot second, third metatarsal ostectomy preformed on 12/16/2017. They state their pain is controlled. He is having issues with his back and balance. His friend states that there was some bleeding on the bandage of medial after surgery but did not progress.  He presents today for dressing change.  He is still taking Augmentin.  Denies any systemic complaints such as fevers, chills, nausea, vomiting. No calf pain, chest pain, shortness of breath.   Objective: General: No acute distress, AAOx3  DP/PT pulses palpable 2/4, CRT < 3 sec to all digits.  Protective sensation intact. Motor function intact.  Right foot: Incision is well coapted without any evidence of dehiscence on the plantar aspect.  There is a central wound that looks open.  There is no pus coming from the area.  Swelling is mildly increased compared to what it was before surgery.  There is no significant erythema there is no increase in warmth.  There is no fluctuation or crepitation or any malodor.  No other areas of tenderness to bilateral lower extremities.  No other open lesions or pre-ulcerative lesions.  No pain with calf compression, swelling, warmth, erythema.   Assessment and Plan:  Status post right foot surgery, doing well with no complications   -Treatment options discussed including all alternatives, risks, and complications -X-rays were obtained reviewed.  Discussed the x-rays with him and his friend today and show them all at this time. -Today the wound was flushed with saline and was repacked utilizing iodoform packing followed by dry sterile dressing.  Continue with similar dressing changes at home daily and his friend is comfortable doing the dressing changes. -Elevation -Continue offloading shoe at all times.  Is having difficulties with his balance given his Parkinson's.  He has a walking boot at home.  We did dispense a PegAssist that he can  put in the boot if needed by doing to be nonweightbearing and he has a knee scooter at home. -Samuel Moran course of antibiotics -Pain medication as needed. -Monitor for any clinical signs or symptoms of infection and DVT/PE and directed to call the office immediately should any occur or go to the ER. -Follow-up in 1 week or sooner if any problems arise. In the meantime, encouraged to call the office with any questions, concerns, change in symptoms.   Samuel Moran, DPM

## 2017-12-22 NOTE — Progress Notes (Signed)
Subjective: Samuel Moran is a 72 y.o. is seen today in office s/p right foot second, third metatarsal ostectomy preformed on 12/16/2017.  He presents today for dressing change.  His friend has been changing the bandage and has been doing well denies any pus coming from the area of swelling is actually improved.  Given his difficulty walking his transition to walking in the cam boot but has been using offloading pad inside of this to help.  He has been walking much better with this.  Overall he feels well he denies any systemic complaints including fevers, chills, nausea, vomiting. No calf pain, chest pain, shortness of breath.   Objective: General: No acute distress, AAOx3  DP/PT pulses palpable 2/4, CRT < 3 sec to all digits.  Protective sensation intact. Motor function intact.  Right foot: Incision is well coapted without any evidence of dehiscence on the plantar aspect.  Upon evaluation of the ulcer there is a small amount of bloody drainage was expressed but there is no purulence.  There is decrease edema to the foot and there is no erythema or increase in warmth.  There is no fluctuation or crepitation and there is no malodor.  There is no clinical signs of infection noted today.  Overall is doing well. No other open lesions or pre-ulcerative lesions.  No pain with calf compression, swelling, warmth, erythema.   Assessment and Plan:  Status post right foot surgery, doing well with no complications   -Treatment options discussed including all alternatives, risks, and complications -Overall doing well.  The dressing was changed today.  The wound was irrigated with saline followed by iodoform packing and a dry sterile dressing.  Continue to recommend offloading all times.  I want him to be nonweightbearing but throughout this whole process he has very difficulty time doing this.  He is walking the cam boot is been walking is been better.  He is trying to limit his activity during the day and he  has been staying with his friend who is been helpful.  Continue with Augmentin for now we are awaiting the results of the culture.  I discussed the pathology results with him today. -Monitor for any clinical signs or symptoms of infection and directed to call the office immediately should any occur or go to the ER.  Trula Slade DPM

## 2017-12-25 ENCOUNTER — Ambulatory Visit (INDEPENDENT_AMBULATORY_CARE_PROVIDER_SITE_OTHER): Payer: Medicare HMO | Admitting: Podiatry

## 2017-12-25 ENCOUNTER — Encounter: Payer: Self-pay | Admitting: Podiatry

## 2017-12-25 DIAGNOSIS — L02611 Cutaneous abscess of right foot: Secondary | ICD-10-CM

## 2017-12-25 DIAGNOSIS — L97519 Non-pressure chronic ulcer of other part of right foot with unspecified severity: Secondary | ICD-10-CM

## 2017-12-25 MED ORDER — AMOXICILLIN-POT CLAVULANATE 875-125 MG PO TABS: 1.0000 | ORAL_TABLET | Freq: Two times a day (BID) | ORAL | 0 refills | Status: DC

## 2017-12-25 NOTE — Progress Notes (Signed)
Subjective: Samuel Moran is a 72 y.o. is seen today in office s/p right foot second, third metatarsal ostectomy preformed on 12/16/2017.  He states that he is doing well.  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  He did drive himself to the office today however.  He is still on antibiotics but he states he is almost out.  Overall he feels well he denies any fevers, chills, nausea, vomiting.  No calf pain chest pain shortness of breath.  Overall the swelling is also improving.  No pain.  No Samuel concerns.  Objective: General: No acute distress, AAOx3  DP/PT pulses palpable 2/4, CRT < 3 sec to all digits.  Protective sensation intact. Motor function intact.  Right foot: Incision is well coapted without any evidence of dehiscence on the plantar aspect.  The central portion has an open wound that we are packing.  The wound is filling and there is no probing to bone there is no undermining or tunneling.  There is no pus expressed only small amount of bloody drainage identified today.  There is no surrounding erythema, ascending sialitis.  There is no fluctuation or crepitation or any malodor.  There is no clinical signs of infection noted today.  No Samuel open lesions are identified.  The previous irritation site on the first ray is well-healed. No Samuel open lesions or pre-ulcerative lesions.  No pain with calf compression, swelling, warmth, erythema.   Assessment and Plan:  Status post right foot surgery, doing well with no complications   -Treatment options discussed including all alternatives, risks, and complications -Today the dressing was changed.  The wound was copes irrigated with saline was packed with a range iodoform packing followed by dry sterile dressing.  Continue with similar dressings at home.  Continue with Darco wedge shoe to offload the area.  Continue to recommend nonweightbearing.  I refilled his Augmentin today. -Monitor for any clinical signs or  symptoms of infection and directed to call the office immediately should any occur or go to the ER.  Trula Slade DPM

## 2017-12-27 ENCOUNTER — Ambulatory Visit (HOSPITAL_BASED_OUTPATIENT_CLINIC_OR_DEPARTMENT_OTHER): Payer: Medicare HMO | Attending: Cardiology | Admitting: Cardiovascular Disease

## 2017-12-27 VITALS — Ht 71.0 in | Wt 215.0 lb

## 2017-12-27 DIAGNOSIS — R0683 Snoring: Secondary | ICD-10-CM

## 2017-12-27 DIAGNOSIS — I4819 Other persistent atrial fibrillation: Secondary | ICD-10-CM

## 2017-12-27 DIAGNOSIS — G4733 Obstructive sleep apnea (adult) (pediatric): Secondary | ICD-10-CM | POA: Insufficient documentation

## 2017-12-27 DIAGNOSIS — Z79899 Other long term (current) drug therapy: Secondary | ICD-10-CM | POA: Diagnosis not present

## 2017-12-27 DIAGNOSIS — R0902 Hypoxemia: Secondary | ICD-10-CM | POA: Insufficient documentation

## 2017-12-27 DIAGNOSIS — Z7901 Long term (current) use of anticoagulants: Secondary | ICD-10-CM | POA: Insufficient documentation

## 2017-12-27 DIAGNOSIS — I493 Ventricular premature depolarization: Secondary | ICD-10-CM | POA: Insufficient documentation

## 2017-12-27 DIAGNOSIS — G4736 Sleep related hypoventilation in conditions classified elsewhere: Secondary | ICD-10-CM | POA: Insufficient documentation

## 2017-12-31 ENCOUNTER — Ambulatory Visit (INDEPENDENT_AMBULATORY_CARE_PROVIDER_SITE_OTHER): Payer: Medicare HMO | Admitting: Podiatry

## 2017-12-31 ENCOUNTER — Encounter: Payer: Self-pay | Admitting: Podiatry

## 2017-12-31 ENCOUNTER — Encounter: Payer: Medicare HMO | Admitting: Podiatry

## 2017-12-31 VITALS — BP 153/93 | HR 83

## 2017-12-31 DIAGNOSIS — L97519 Non-pressure chronic ulcer of other part of right foot with unspecified severity: Secondary | ICD-10-CM

## 2017-12-31 DIAGNOSIS — L02611 Cutaneous abscess of right foot: Secondary | ICD-10-CM

## 2017-12-31 NOTE — Progress Notes (Signed)
Subjective: Samuel Moran is a 72 y.o. is seen today in office s/p right foot second, third metatarsal ostectomy preformed on 12/16/2017.  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  He has noticed some blood coming from the wound but denies any pus.  States that the swelling is improved and denies any redness or warmth.  He has no pain.  He is on Augmentin. Denies any fevers, chills, nausea, vomiting.  No calf pain chest pain shortness of breath.  Overall the swelling is also improving.  No pain.  No other concerns.  Objective: General: No acute distress, AAOx3  DP/PT pulses palpable 2/4, CRT < 3 sec to all digits.  Protective sensation intact. Motor function intact.  Right foot: Incision is well coapted without any evidence of dehiscence on the plantar aspect.  Overall the wound is improving and becoming more superficial.  Sutures are intact the proximal distal aspect of the wound.  Decreased swelling to the foot there is no erythema increased warmth.  Overall his foot is looking much better.  No signs of infection noted today. No other open lesions or pre-ulcerative lesions.  No pain with calf compression, swelling, warmth, erythema.   Assessment and Plan:  Status post right foot surgery, doing well with no complications   -Treatment options discussed including all alternatives, risks, and complications -I cleaned the wound today.  The area was repacked with iodoform dressing followed by dry sterile dressing.  Continue to recommend nonweightbearing but he has had to put weight on it and drive some.  Darco offloading shoe was dispensed today.  Offloading at all times.  Elevation.  Continue Augmentin.  Awaiting final bone culture results. -Monitor for any clinical signs or symptoms of infection and directed to call the office immediately should  any occur or go to the ER.  Trula Slade DPM

## 2018-01-01 ENCOUNTER — Encounter: Payer: Self-pay | Admitting: Cardiology

## 2018-01-01 ENCOUNTER — Ambulatory Visit: Payer: Medicare HMO | Admitting: Cardiology

## 2018-01-01 ENCOUNTER — Ambulatory Visit: Payer: Medicare HMO | Admitting: Neurology

## 2018-01-01 ENCOUNTER — Telehealth: Payer: Self-pay | Admitting: *Deleted

## 2018-01-01 ENCOUNTER — Other Ambulatory Visit: Payer: Self-pay | Admitting: Podiatry

## 2018-01-01 ENCOUNTER — Encounter: Payer: Medicare HMO | Admitting: Podiatry

## 2018-01-01 VITALS — BP 126/74 | HR 83 | Ht 71.0 in | Wt 212.0 lb

## 2018-01-01 DIAGNOSIS — I481 Persistent atrial fibrillation: Secondary | ICD-10-CM | POA: Diagnosis not present

## 2018-01-01 DIAGNOSIS — I10 Essential (primary) hypertension: Secondary | ICD-10-CM

## 2018-01-01 DIAGNOSIS — I4819 Other persistent atrial fibrillation: Secondary | ICD-10-CM

## 2018-01-01 MED ORDER — AMOXICILLIN-POT CLAVULANATE 875-125 MG PO TABS: 1.0000 | ORAL_TABLET | Freq: Two times a day (BID) | ORAL | 2 refills | Status: DC

## 2018-01-01 NOTE — Patient Instructions (Signed)
Medication Instructions:  Your physician recommends that you continue on your current medications as directed. Please refer to the Current Medication list given to you today.   Labwork: None  Testing/Procedures: Your physician has recommended that you have a sleep study. This test records several body functions during sleep, including: brain activity, eye movement, oxygen and carbon dioxide blood levels, heart rate and rhythm, breathing rate and rhythm, the flow of air through your mouth and nose, snoring, body muscle movements, and chest and belly movement. We will attempt to schedule you for a home sleep test. You will be contacted with more information regarding this testing.  Follow-Up: Your physician recommends that you schedule a follow-up appointment in: 2 months.   If you need a refill on your cardiac medications before your next appointment, please call your pharmacy.   Thank you for choosing [facility]! Robyne Peers, RN [phone]

## 2018-01-01 NOTE — Telephone Encounter (Signed)
Called and the voice mail is full and can not accept any messages at this time. Samuel Moran

## 2018-01-01 NOTE — Progress Notes (Signed)
Cardiology Office Note:    Date:  01/01/2018   ID:  Samuel Moran, DOB July 24, 1945, MRN [id number]  PCP:  [name], MD  Cardiologist:  Jenne Campus, MD    Referring MD: [name], MD   Chief Complaint  Patient presents with  . Follow-up    1 month follow up   Doing fine  History of Present Illness:    Samuel Moran is a 72 y.o. male with persistent atrial fibrillation.  The purpose of the visit is to talk about cardioversion.  He did have a sleep study however he was not able to sleep through the night at all.  I still think it would be reasonable to perform sleep study to see if he gets significant sleep apnea before attempting cardioversion.  He heard about home sleep study will try to make arrangements for that but will not wait longer than 3 weeks before cardioversion I explained to him cardioversion he is ready to proceed.  Again we will try to make arrangement for sleep study at home if will be unsuccessful with this we will simply proceed with cardioversion.  He continues to take his Eliquis and I encouraged him to stay on it.  Past Medical History:  Diagnosis Date  . Atrial fibrillation ([place])    onset 07/17/17  . Dysrhythmia   . ED (erectile dysfunction)   . GERD 07/01/2007  . Parkinson's disease ([place])   . 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 PROSTATE SPECIFIC ANTIGEN, ELEVATED 06/29/2007  . Rheumatoid arthritis ([place])   . SEIZURE DISORDER 07/01/2007   one epsiode only  . Spinal stenosis of lumbar region 09/22/2014    Past Surgical History:  Procedure Laterality Date  . AMPUTATION     right great toe - tramatic age 72  . COLONOSCOPY    . DEBRIDEMENT TOE Right 07/2016  . HAND SURGERY  03/2017   removal cysts  . IR FLUORO GUIDE CV LINE RIGHT  10/16/2016  . IR US GUIDE VASC ACCESS RIGHT  10/16/2016  . OSTECTOMY Right 08/05/2017   Procedure: OSTECTOMY COMPLETE METARSAL HEAD FIRST RIGHT;  Surgeon: Trula Slade, DPM;  Location: [facility];   Service: Podiatry;  Laterality: Right;    Current Medications: Current Meds  Medication Sig  . amoxicillin-clavulanate (AUGMENTIN) 875-125 MG tablet Take 1 tablet by mouth 2 (two) times daily.  Marland Kitchen apixaban (ELIQUIS) 5 MG TABS tablet Eliquis 5 mg tablet  TAKE 1 TABLET BY MOUTH TWICE A DAY  . carbidopa-levodopa (SINEMET IR) 25-100 MG tablet TAKE 1 TABLET BY MOUTH THREE TIMES A DAY  . furosemide (LASIX) 20 MG tablet TAKE 1 TABLET BY MOUTH EVERY DAY  . metoprolol succinate (TOPROL-XL) 50 MG 24 hr tablet TAKE 1 TABLET (50 MG TOTAL) BY MOUTH DAILY. TAKE WITH OR IMMEDIATELY FOLLOWING A MEAL.  . Multiple Vitamins-Minerals (CENTRUM SILVER PO) Take 1 tablet by mouth daily.   Marland Kitchen NEUPRO 4 MG/24HR PLACE 1 PATCH ONTO THE SKIN DAILY.     Allergies:   Patient has no known allergies.   Social History   Socioeconomic History  . Marital status: Single    Spouse name: Not on file  . Number of children: Not on file  . Years of education: Not on file  . Highest education level: Not on file  Occupational History  . Occupation: Chartered certified accountant  Social Needs  . Financial resource strain: Not on file  . Food insecurity:    Worry: Not on file  Inability: Not on file  . Transportation needs:    Medical: Not on file    Non-medical: Not on file  Tobacco Use  . Smoking status: Light Tobacco Smoker    Types: Cigars  . Smokeless tobacco: Never Used  . Tobacco comment: 1 cigar, takes 3 weeks  Substance and Sexual Activity  . Alcohol use: Yes    Alcohol/week: 4.0 standard drinks    Types: 4 Standard drinks or equivalent per week    Comment: one drink a day (liquor)  . Drug use: No  . Sexual activity: Not on file  Lifestyle  . Physical activity:    Days per week: Not on file    Minutes per session: Not on file  . Stress: Not on file  Relationships  . Social connections:    Talks on phone: Not on file    Gets together: Not on file    Attends religious service: Not on file    Active member of club  or organization: Not on file    Attends meetings of clubs or organizations: Not on file    Relationship status: Not on file  Other Topics Concern  . Not on file  Social History Narrative  . Not on file     Family History: The patient's family history includes Alcoholism in his mother; Arthritis in his father and mother. There is no history of Colon cancer, Esophageal cancer, Rectal cancer, or Stomach cancer. ROS:   Please see the history of present illness.    All 14 point review of systems negative except as described per history of present illness  EKGs/Labs/Other Studies Reviewed:      Recent Labs: 02/26/2017: ALT 14; TSH 1.59 08/05/2017: Magnesium 1.9 08/09/2017: BUN 12; Creatinine, Ser 0.90; Hemoglobin 10.8; Platelets 410; Potassium 3.7; Sodium 137  Recent Lipid Panel    Component Value Date/Time   CHOL 168 02/26/2017 0922   TRIG 55.0 02/26/2017 0922   HDL 52.90 02/26/2017 0922   CHOLHDL 3 02/26/2017 0922   VLDL 11.0 02/26/2017 0922   LDLCALC 104 (H) 02/26/2017 0922    Physical Exam:    VS:  BP 126/74   Pulse 83   Ht 5\' 11"  (1.803 m)   Wt 212 lb (96.2 kg)   BMI 29.57 kg/m     Wt Readings from Last 3 Encounters:  01/01/18 212 lb (96.2 kg)  12/27/17 215 lb (97.5 kg)  11/09/17 209 lb 12.8 oz (95.2 kg)     GEN:  Well nourished, well developed in no acute distress HEENT: Normal NECK: No JVD; No carotid bruits LYMPHATICS: No lymphadenopathy CARDIAC: Irregularly irregular, no murmurs, no rubs, no gallops RESPIRATORY:  Clear to auscultation without rales, wheezing or rhonchi  ABDOMEN: Soft, non-tender, non-distended MUSCULOSKELETAL:  No edema; No deformity  SKIN: Warm and dry LOWER EXTREMITIES: no swelling NEUROLOGIC:  Alert and oriented x 3 PSYCHIATRIC:  Normal affect   ASSESSMENT:    1. Persistent atrial fibrillation ([place])   2. Essential hypertension    PLAN:    In order of problems listed above:  1. Persistent atrial fibrillation plan as outlined  above we will make arrangements for cardioversion 2. Essential hypertension blood pressure well controlled continue present management. 3. Parkinson's disease stable followed by internal medicine team   Medication Adjustments/Labs and Tests Ordered: Current medicines are reviewed at length with the patient today.  Concerns regarding medicines are outlined above.  No orders of the defined types were placed in this encounter.  Medication changes:  No orders of the defined types were placed in this encounter.   Signed, Park Liter, MD, [facility] 01/01/2018 9:50 AM    [place]

## 2018-01-01 NOTE — Progress Notes (Signed)
Reviewed cultures. Will do Augmentin for a total of 6 weeks. Tried to call him but VM is full. Sent refill for him.

## 2018-01-05 ENCOUNTER — Ambulatory Visit (INDEPENDENT_AMBULATORY_CARE_PROVIDER_SITE_OTHER): Payer: Medicare HMO | Admitting: Podiatry

## 2018-01-05 ENCOUNTER — Encounter: Payer: Self-pay | Admitting: Podiatry

## 2018-01-05 ENCOUNTER — Ambulatory Visit (INDEPENDENT_AMBULATORY_CARE_PROVIDER_SITE_OTHER): Payer: Medicare HMO

## 2018-01-05 VITALS — Temp 96.4°F

## 2018-01-05 DIAGNOSIS — L97519 Non-pressure chronic ulcer of other part of right foot with unspecified severity: Secondary | ICD-10-CM

## 2018-01-05 DIAGNOSIS — L97512 Non-pressure chronic ulcer of other part of right foot with fat layer exposed: Secondary | ICD-10-CM

## 2018-01-05 MED ORDER — COLLAGENASE 250 UNIT/GM EX OINT: 1.0000 "application " | TOPICAL_OINTMENT | Freq: Every day | CUTANEOUS | 5 refills | Status: DC

## 2018-01-05 NOTE — Progress Notes (Signed)
Subjective: Samuel Moran is a 72 y.o. is seen today in office s/p right foot second, third metatarsal ostectomy preformed on 12/16/2017.  He presents today for same-day appointment as he called over the weekend and some stitches open and the wound got somewhat larger.  Denies any drainage or pus coming from the area.  His friend states that she changes the pain is daily and she cannot get much packing to the area but overall is gotten somewhat larger.  Denies any increase in swelling or redness.  Entrance the operative foot as much as possible he can wear the Darco wedge shoe.  He is continue with Augmentin.  He denies any systemic complaints including fevers, chills, nausea, vomiting.  No calf pain, chest pain, shortness of breath.  No other concerns today.  Objective: General: No acute distress, AAOx3  DP/PT pulses palpable 2/4, CRT < 3 sec to all digits.  Protective sensation intact. Motor function intact.  Right foot: Incision is well coapted without any evidence of dehiscence on the plantar aspect.  Some of the sutures have come loose.  The wound today after debridement measured 1.5 x 1.1 x 1 cm.  There is one area still probes but there is no probing to bone.  There is no drainage or pus identified today and after debridement only small amount of bloody drainage.  Mild increase in swelling to the foot compared to what it was last time I saw him but there is no erythema or increase in warmth of the foot.  There is no tenderness to palpation. No other open lesions or pre-ulcerative lesions.  No pain with calf compression, swelling, warmth, erythema.   Assessment and Plan:  Status post right foot surgery  -Treatment options discussed including all alternatives, risks, and complications -X-rays obtained reviewed.  No soft tissue emphysema and no definitive evidence of acute osteomyelitis. -I debrided the wound to healthy, granular tissue utilizing a tissue nipper as well as a #312 blade  scalpel.  There is some fibrotic tissue at the base of the wound.  And switch to Santyl to the wound daily.  This was ordered for her today.  We can pack the one area with iodoform packing and the rest of the wound around can be seen until.  Discussed applied dry dressing over this area.  Continue recommend nonweightbearing elevation.  Continue Augmentin.  Discussed using Augmentin for 6 weeks status post surgery. -Monitor for any clinical signs or symptoms of infection and directed to call the office immediately should any occur or go to the ER. -Follow-up on Friday as scheduled or sooner if needed.  Trula Slade DPM

## 2018-01-06 ENCOUNTER — Encounter (HOSPITAL_BASED_OUTPATIENT_CLINIC_OR_DEPARTMENT_OTHER): Payer: Self-pay | Admitting: Cardiovascular Disease

## 2018-01-06 NOTE — Procedures (Signed)
Patient Name: Samuel Moran, Samuel Moran Date: 12/27/2017 Gender: Male D.O.B: [birth year] Age (years): 38 Referring Provider: Park Liter Height (inches): 71 Interpreting Physician: Shelva Majestic MD, ABSM Weight (lbs): 215 RPSGT: Baxter Flattery BMI: 30 MRN: [id number] Neck Size: 18.00  CLINICAL INFORMATION Sleep Study Type: NPSG  Indication for sleep study: Fatigue, Snoring, Witnesses Apnea / Gasping During Sleep, Atrial Fibrillation  Epworth Sleepiness Score: 3  SLEEP STUDY TECHNIQUE As per the AASM Manual for the Scoring of Sleep and Associated Events v2.3 (April 2016) with a hypopnea requiring 4% desaturations.  The channels recorded and monitored were frontal, central and occipital EEG, electrooculogram (EOG), submentalis EMG (chin), nasal and oral airflow, thoracic and abdominal wall motion, anterior tibialis EMG, snore microphone, electrocardiogram, and pulse oximetry.  MEDICATIONS     amoxicillin-clavulanate (AUGMENTIN) 875-125 MG tablet             apixaban (ELIQUIS) 5 MG TABS tablet         carbidopa-levodopa (SINEMET IR) 25-100 MG tablet         collagenase (SANTYL) ointment         furosemide (LASIX) 20 MG tablet         metoprolol succinate (TOPROL-XL) 50 MG 24 hr tablet         Multiple Vitamins-Minerals (CENTRUM SILVER PO)         NEUPRO 4 MG/24HR      Medications self-administered by patient taken the night of the study : ELIQUIS, CARBIDOPA-LEVODOPA, AUGMENTIN  SLEEP ARCHITECTURE The study was initiated at 10:13:07 PM and ended at 4:33:22 AM.  Sleep onset time was 69.2 minutes and the sleep efficiency was 35.5%%. The total sleep time was 135 minutes. Wake after sleep onset (WASO) was 176.1 minutes.  Stage REM latency was N/A minutes.  The patient spent 10.0%% of the night in stage N1 sleep, 90.0%% in stage N2 sleep, 0.0%% in stage N3 and 0% in REM.  Alpha intrusion was absent.  Supine sleep was 45.56%.  RESPIRATORY PARAMETERS The overall  apnea/hypopnea index (AHI) was 7.1 per hour. The respiratory disturbance index was 10.7 per hour. There were 6 total apneas, including 4 obstructive, 2 central and 0 mixed apneas. There were 10 hypopneas and 8 RERAs.  The AHI during Stage REM sleep was N/A per hour.  AHI while supine was 13.7 per hour.  The mean oxygen saturation was 95.3%. The minimum SpO2 during sleep was 89.0%.  Soft snoring was noted during this study.  CARDIAC DATA The 2 lead EKG demonstrated sinus rhythm. The mean heart rate was 74.6 beats per minute. Other EKG findings include: PVCs.  LEG MOVEMENT DATA The total PLMS were 0 with a resulting PLMS index of 0.0. Associated arousal with leg movement index was 0.4 .  IMPRESSIONS - Mild obstructive sleep apnea overall (AHI 7.1/h; RDI 10.7/h); however, events were more significant with supine sleep (AHI 13.7/h) and REM sleep was not obtained.  - No significant central sleep apnea occurred during this study (CAI = 0.9/h). - Minimal oxygen desaturation to a nadir of  89%. - The patient snored with soft snoring volume. - Poor sleep efficiency at only 35.5%.  - Abnormal sleep architecture with absence of slow wave and REM sleep.  - EKG findings include PVCs. - Clinically significant periodic limb movements did not occur during sleep. No significant associated arousals.  DIAGNOSIS - Obstructive Sleep Apnea (327.23 [G47.33 ICD-10]) - Nocturnal Hypoxemia (327.26 [G47.36 ICD-10])  RECOMMENDATIONS - In this patient with significant cardiovascular co-morbidities  including AF, recommend CPAP titration study for treatment of his sleep apnea. - Efforts should be made to optimize nasal and oropharyngeal patency. - Positional therapy avoiding supine position during sleep. - Avoid alcohol, sedatives and other CNS depressants that may worsen sleep apnea and disrupt normal sleep architecture. - Sleep hygiene should be reviewed to assess factors that may improve sleep quality. -  Weight management and regular exercise should be initiated or continued if appropriate.  [Electronically signed] 01/06/2018 07:49 AM  Shelva Majestic MD,FACC, ABSM Diplomate, American Board of Sleep Medicine   NPI: [id number] [place] PH: [phone]   FX: [phone] Samuel Moran

## 2018-01-07 ENCOUNTER — Other Ambulatory Visit: Payer: Self-pay | Admitting: *Deleted

## 2018-01-07 ENCOUNTER — Other Ambulatory Visit: Payer: Self-pay | Admitting: Cardiovascular Disease

## 2018-01-07 ENCOUNTER — Telehealth: Payer: Self-pay | Admitting: *Deleted

## 2018-01-07 DIAGNOSIS — I480 Paroxysmal atrial fibrillation: Secondary | ICD-10-CM

## 2018-01-07 DIAGNOSIS — L97512 Non-pressure chronic ulcer of other part of right foot with fat layer exposed: Secondary | ICD-10-CM

## 2018-01-07 DIAGNOSIS — I10 Essential (primary) hypertension: Secondary | ICD-10-CM

## 2018-01-07 DIAGNOSIS — G4733 Obstructive sleep apnea (adult) (pediatric): Secondary | ICD-10-CM

## 2018-01-07 NOTE — Telephone Encounter (Signed)
Patient returned a call to me and was give sleep study results and recommendations. He voiced his understanding and requested that I also call his caregiver, Addison Lank @ [phone] to notify her as well.

## 2018-01-07 NOTE — Telephone Encounter (Signed)
Left message to return a call. 

## 2018-01-07 NOTE — Telephone Encounter (Signed)
-----   Message from Troy Sine, MD sent at 01/06/2018  7:55 AM EDT ----- Samuel Moran  Please notify pt and set up for CPAP titration study.

## 2018-01-07 NOTE — Telephone Encounter (Signed)
Called and spoke with the patient and I stated that Dr Jacqualyn Posey would like for the patient to get blood work done and the patient stated that he was coming in tomorrow morning to see Dr Jacqualyn Posey. Samuel Moran

## 2018-01-08 ENCOUNTER — Telehealth: Payer: Self-pay | Admitting: *Deleted

## 2018-01-08 ENCOUNTER — Encounter: Payer: Medicare HMO | Admitting: Podiatry

## 2018-01-08 ENCOUNTER — Ambulatory Visit (INDEPENDENT_AMBULATORY_CARE_PROVIDER_SITE_OTHER): Payer: Medicare HMO | Admitting: Podiatry

## 2018-01-08 DIAGNOSIS — Z01812 Encounter for preprocedural laboratory examination: Secondary | ICD-10-CM | POA: Diagnosis not present

## 2018-01-08 DIAGNOSIS — L97512 Non-pressure chronic ulcer of other part of right foot with fat layer exposed: Secondary | ICD-10-CM

## 2018-01-08 LAB — CREATININE, SERUM: CREATININE: 0.87 mg/dL (ref 0.70–1.18)

## 2018-01-08 NOTE — Telephone Encounter (Signed)
PA request submitted to [employer] via web portal for CPAP titration.

## 2018-01-08 NOTE — Progress Notes (Signed)
Patient notified of sleep study  Results and recommendations.

## 2018-01-08 NOTE — Telephone Encounter (Signed)
Samuel Moran, patient's caregiver notifed of sleep study results and recommendations as requested by the patient.

## 2018-01-08 NOTE — Telephone Encounter (Signed)
-----   Message from Lauralee Evener, [place] sent at 01/07/2018  2:58 PM EDT ----- CPAP titration

## 2018-01-09 NOTE — Progress Notes (Addendum)
Subjective: Samuel Moran is a 72 y.o. is seen today in office s/p right foot second, third metatarsal ostectomy preformed on 12/16/2017.  He presents today for dressing change.  His friend has been continue the dressing changes and they deny any drainage or pus coming from the area.  The Santyl has been shifted and will likely get this on Monday.  He is continued on Augmentin.  He has no other concerns today. He denies any systemic complaints including fevers, chills, nausea, vomiting.  No calf pain, chest pain, shortness of breath.  No other concerns today.  Objective: General: No acute distress, AAOx3  DP/PT pulses palpable 2/4, CRT < 3 sec to all digits.  Protective sensation intact. Motor function intact.  Right foot: Incision is well coapted without any evidence of dehiscence on the plantar aspect.  Some of the sutures have come loose.  The wound today after debridement measured 1.5 x 1.1 x 1 cm.  Overall the wounds about the same size.  It does appear to be more healthy today than it did on Monday.  There is less fibrotic tissue after debridement.  There is no swelling erythema, ascending sialitis but there is no fluctuation or crepitation or malodor.  There is decreased swelling to the foot today compared to last appointment as well. No pain with calf compression, swelling, warmth, erythema.   Assessment and Plan:  Status post right foot surgery  -Treatment options discussed including all alternatives, risks, and complications -I did sharply debride the wound today to healthy, granular tissue utilizing #312 blade scalpel as well as a tissue doctor.  We did continue daily dressing changes packing and a 4 x 4 wet-to-dry dressing for now until we can get the Santyl.  Continue offloading at all times.  Wear the Darco wedge shoe.  I do recommend nonweightbearing he has difficulty doing the surgery to continue with the right shoe.  Elevation and limit activity.  Continue Augmentin for total of 6  weeks post surgery. -Blood work ordered. -Monitor for any clinical signs or symptoms of infection and directed to call the office immediately should any occur or go to the ER.  Trula Slade DPM

## 2018-01-11 DIAGNOSIS — M5416 Radiculopathy, lumbar region: Secondary | ICD-10-CM | POA: Diagnosis not present

## 2018-01-11 DIAGNOSIS — M5136 Other intervertebral disc degeneration, lumbar region: Secondary | ICD-10-CM | POA: Diagnosis not present

## 2018-01-11 DIAGNOSIS — M4186 Other forms of scoliosis, lumbar region: Secondary | ICD-10-CM | POA: Diagnosis not present

## 2018-01-12 ENCOUNTER — Encounter: Payer: Self-pay | Admitting: Podiatry

## 2018-01-12 ENCOUNTER — Telehealth: Payer: Self-pay | Admitting: *Deleted

## 2018-01-12 NOTE — Telephone Encounter (Signed)
Notified patient's caregiver Holland Falling denied CPAP titration study. APAP will be ordered through [facility].

## 2018-01-15 ENCOUNTER — Encounter: Payer: Self-pay | Admitting: Neurology

## 2018-01-15 ENCOUNTER — Ambulatory Visit (INDEPENDENT_AMBULATORY_CARE_PROVIDER_SITE_OTHER): Payer: Medicare HMO | Admitting: Neurology

## 2018-01-15 ENCOUNTER — Encounter: Payer: Self-pay | Admitting: Podiatry

## 2018-01-15 ENCOUNTER — Ambulatory Visit (INDEPENDENT_AMBULATORY_CARE_PROVIDER_SITE_OTHER): Payer: Medicare HMO | Admitting: Podiatry

## 2018-01-15 VITALS — BP 140/88 | HR 90 | Ht 71.0 in | Wt 210.0 lb

## 2018-01-15 VITALS — Temp 97.7°F

## 2018-01-15 DIAGNOSIS — G20 Parkinson's disease: Secondary | ICD-10-CM | POA: Diagnosis not present

## 2018-01-15 DIAGNOSIS — G4733 Obstructive sleep apnea (adult) (pediatric): Secondary | ICD-10-CM

## 2018-01-15 DIAGNOSIS — L97512 Non-pressure chronic ulcer of other part of right foot with fat layer exposed: Secondary | ICD-10-CM | POA: Diagnosis not present

## 2018-01-15 DIAGNOSIS — L97519 Non-pressure chronic ulcer of other part of right foot with unspecified severity: Secondary | ICD-10-CM

## 2018-01-15 NOTE — Progress Notes (Signed)
Samuel Moran was seen today in the movement disorders clinic for neurologic consultation at the request of Samuel Lor, MD.  The consultation is for the evaluation of R hand tremor and gait change x 6 months.  Tremor is noted at rest.  He is R hand dominant.  Tremor is gone when he picks up arm or makes a fist.  Pt states that he takes methotrexate and he noted that after he takes it he will shuffle for a few days and not swing the arms.  Dr. [name], therefore, took him off of the medication 3 weeks ago and he feels that walking is a bit better.  The records that were made available to me were reviewed.  Does report hx of seizure in 2008 and was at self checkout and couldn't figure out how to do steps.  Got finished, walked to car and next thing he remembers is being in ambulance.  Apparently, got in car and had death grip on wheel.  EMS got there and he was confused and wouldn't unlock door.  They broke window and took him to hospital.  W/U negative except "white spot" on brain.  03/14/16 update:  Pt returns for f/u much earlier than expected.  Has dx of PD, and last visit he didn't want to start medication.  He decided almost immediately after the visit that he wanted to start medication but he wanted to come back and discuss and discuss the medication in more detail.  Has a myriad of questions today.  Insurance company denied authorization for his MRI brain.  07/03/16 update:  Patient follows up today.  I started him on pramipexole last visit.  He called me in February to state that he had no side effects with medication, but his friends brother-in-law had a sleep attack on the medication and he was concerned about the potential side effect.  We had discussed this potential side effect previously.  Since he was side effect free, I told him that I would recommend that he continue on the medication.  2 weeks later he called to tell me that he was sleeping all of the time on the medication.    Today he states that it was really sleepiness but "spaciness" and "I wasn't all there."   I told him just to discontinue the medication and we could talk about it at follow-up.  I did get a note from his rheumatologist, Dr. Trudie Moran, dated 06/30/2016 that stated that the patient had an acute rheumatoid arthritis flareup.  She thought that it was because he discontinued his methotrexate.  The patient did that because he thought methotrexate was causing dizziness, but she did not think that was the case and thought it was the fact that he was taking a different version of folic acid.  She restarted methotrexate and folic acid.  Thinks that he is moving around okay short of the RA flare.  He is riding the bike for 10 min 3 times a week.  No falls.  No lightheadedness or near syncope.  Mood has been okay.    09/03/16 update:  Patient seen today in follow-up.  He is not on any Parkinson's medication, which was his desire.  He got off of methotrexate, which he felt caused side effects.  He is now on leflunomide and would like to get back on medications for Parkinson's disease.  I just got a note from his rheumatologist that the patient wanted to wait on restarting methotrexate until  he started his Parkinson's medicine.  The reason we waited last visit to restart his Parkinson's medicine was because he was getting started back on rheumatoid medicine.  He brings medication pamphlet on rheumatoid meds he is considering so that meds we choose don't interfere.   The patient is having more trouble with tremor and arm swing.  He has not had any falls.  He has not had any lightheadedness or near syncope.  He denies significant depression.  12/09/16 update:  Patient seen in follow-up.  He was started on Neupro last visit.  He is now on 4 mg daily.  He has had no sleep attacks.  No compulsive behaviors.  No site rxn's. He thinks that it controls tremor well.  He did miss the medication on Sunday and then noted more tremor on Monday.   He had one fall in [place] but he caught his foot on a cushion; he didn't get hurt.  Pt denies lightheadedness, near syncope.  No hallucinations.  Mood has been good.  He is exercising on the stationary bike in the gym 4 days a week (11 min per time).    04/10/17 update: Patient seen in follow-up for Parkinson's disease.  He is on the Neupro patch, 4 mg daily. No site reactions. He called me last month to state that he felt his tremors worse.  He wanted me to adjust his Neupro.  I told him I wanted to see him first, as I already knew he had an pending appointment.  He has had no falls.   He had a single instance since last visit of lightheadedness/dizziness but he accidentally was wearing 2 neupro patches. I have reviewed records available to me since last visit.  He was seen in orthopedic surgeon who does spine for low back pain.  He has requested a referral to a neurosurgeon, which has been made by his primary care physician.  07/20/17 update: Patient is seen in follow-up for Parkinson's disease.  He is on Neupro patch, 4 mg daily.  He forgot to put it on today.  No site reactions.    Last visit, levodopa was added.  He is on carbidopa/levodopa 25/100, 1 tablet 3 times per day. He has not taken that today.   He reports that he feels he may be progressing with tremor.  The records that were made available to me were reviewed.  Been to podiatry multiple times since our last visit due to foot ulcer and had surgery for I&D on Friday.  MRI demonstrated findings consistent with osteomyelitis but patient states that he was told it didn't show infection.  Had to stop exercise because of foot pain.  Was previously doing stationary cycle.  Hands and feet feel stiff and has trouble differentiating his RA with PD.   09/30/17 update:  Pt worked in to the office today for c/o dizziness.  He is using a walking stick because of it.  It never happens when sitting down. On carbidopa/levodopa 25/100 tid and neupro patch, 4 mg  daily.  Pt denies falls.  No hallucinations.  Mood has been good.  Dx with a-fib since last visit.  The records that were made available to me were reviewed.  Pt cannot tell when in/out of a-fib.   Started on eliquis.  toprol was started about 3 weeks ago per patient.  Thinks dizziness preceeded the addition of toprol.    01/15/18 update: Patient is seen today in follow-up for Parkinson's disease.  He remains on carbidopa/levodopa  25/100, 1 tablet 3 times per day.  He is also on the Neupro patch, 4 mg daily.  He has had no site reactions.  No sleep attacks.  No compulsive behaviors.  No falls. Doing push ups/sit ups for exercise.   Last visit, he was complaining about dizziness and he wondered if this was not from atrial fibrillation.  He saw cardiology right after that visit and they reported persistent atrial fibrillation and told him to take his Eliquis, 5 mg twice per day.  Cardioversion was discussed to see if bring him back in persistent sinus rhythm would help the daytime fatigue and dizziness.  Pt is unsure of when/if that is going to be done.  He did have a nocturnal polysomnogram.  AHI was only 7.1.  CPAP was recommended because of his coexisting cardiovascular comorbidities.  He is awaiting a titration study.  Pt does state that dizziness is better.  PREVIOUS MEDICATIONS: Pramipexole (patient had a friend who had a sleep attack on the medication and subsequently the patient developed sleepiness on the medication and we decided to discontinue)  ALLERGIES:  No Known Allergies  CURRENT MEDICATIONS:  Outpatient Encounter Medications as of 01/15/2018  Medication Sig  . amoxicillin-clavulanate (AUGMENTIN) 875-125 MG tablet Take 1 tablet by mouth 2 (two) times daily.  Marland Kitchen apixaban (ELIQUIS) 5 MG TABS tablet Eliquis 5 mg tablet  TAKE 1 TABLET BY MOUTH TWICE A DAY  . carbidopa-levodopa (SINEMET IR) 25-100 MG tablet TAKE 1 TABLET BY MOUTH THREE TIMES A DAY  . collagenase (SANTYL) ointment Apply 1  application topically daily.  . furosemide (LASIX) 20 MG tablet TAKE 1 TABLET BY MOUTH EVERY DAY  . metoprolol succinate (TOPROL-XL) 50 MG 24 hr tablet TAKE 1 TABLET (50 MG TOTAL) BY MOUTH DAILY. TAKE WITH OR IMMEDIATELY FOLLOWING A MEAL.  . Multiple Vitamins-Minerals (CENTRUM SILVER PO) Take 1 tablet by mouth daily.   Marland Kitchen NEUPRO 4 MG/24HR PLACE 1 PATCH ONTO THE SKIN DAILY.   No facility-administered encounter medications on file as of 01/15/2018.     PAST MEDICAL HISTORY:   Past Medical History:  Diagnosis Date  . Atrial fibrillation ([place])    onset 07/17/17  . Dysrhythmia   . ED (erectile dysfunction)   . GERD 07/01/2007  . Parkinson's disease ([place])   . 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 PROSTATE SPECIFIC ANTIGEN, ELEVATED 06/29/2007  . Rheumatoid arthritis ([place])   . SEIZURE DISORDER 07/01/2007   one epsiode only  . Spinal stenosis of lumbar region 09/22/2014    PAST SURGICAL HISTORY:   Past Surgical History:  Procedure Laterality Date  . AMPUTATION     right great toe - tramatic age 70  . COLONOSCOPY    . DEBRIDEMENT TOE Right 07/2016  . HAND SURGERY  03/2017   removal cysts  . IR FLUORO GUIDE CV LINE RIGHT  10/16/2016  . IR US GUIDE VASC ACCESS RIGHT  10/16/2016  . OSTECTOMY Right 08/05/2017   Procedure: OSTECTOMY COMPLETE METARSAL HEAD FIRST RIGHT;  Surgeon: Trula Slade, DPM;  Location: [facility];  Service: Podiatry;  Laterality: Right;    SOCIAL HISTORY:   Social History   Socioeconomic History  . Marital status: Single    Spouse name: Not on file  . Number of children: Not on file  . Years of education: Not on file  . Highest education level: Not on file  Occupational History  . Occupation: Chartered certified accountant  Social Needs  . Financial resource strain: Not on file  .  Food insecurity:    Worry: Not on file    Inability: Not on file  . Transportation needs:    Medical: Not on file    Non-medical: Not on file  Tobacco Use  . Smoking status: Light Tobacco Smoker     Types: Cigars  . Smokeless tobacco: Never Used  . Tobacco comment: 1 cigar, takes 3 weeks  Substance and Sexual Activity  . Alcohol use: Yes    Alcohol/week: 4.0 standard drinks    Types: 4 Standard drinks or equivalent per week    Comment: one drink a day (liquor)  . Drug use: No  . Sexual activity: Not on file  Lifestyle  . Physical activity:    Days per week: Not on file    Minutes per session: Not on file  . Stress: Not on file  Relationships  . Social connections:    Talks on phone: Not on file    Gets together: Not on file    Attends religious service: Not on file    Active member of club or organization: Not on file    Attends meetings of clubs or organizations: Not on file    Relationship status: Not on file  . Intimate partner violence:    Fear of current or ex partner: Not on file    Emotionally abused: Not on file    Physically abused: Not on file    Forced sexual activity: Not on file  Other Topics Concern  . Not on file  Social History Narrative  . Not on file    FAMILY HISTORY:   Family Status  Relation Name Status  . Mother  Deceased  . Father  Deceased  . Child adopted Alive  . Neg Hx  (Not Specified)    ROS:   Review of Systems  Constitutional: Negative.   HENT: Negative.   Eyes: Negative.   Respiratory: Negative.   Cardiovascular: Negative.   Gastrointestinal: Negative.   Genitourinary: Negative.   Skin: Negative.      PHYSICAL EXAMINATION:    VITALS:   Vitals:   01/15/18 0946  BP: 140/88  Pulse: 90  SpO2: 97%  Weight: 210 lb (95.3 kg)  Height: 5' 11"  (1.803 m)   No data found.   GEN:  The patient appears stated age and is in NAD. HEENT:  Normocephalic, atraumatic.  The mucous membranes are moist. The superficial temporal arteries are without ropiness or tenderness. CV:  irregular Lungs:  CTAB Neck/HEME:  There are no carotid bruits bilaterally. MS:  R foot in a boot and toes/feet wrapped in gauze.  Legs are both swollen  with pitting edema.   Derm:  There are nodules over the PIP joints.    Neurological examination:  Orientation: The patient is alert and oriented x3.  Cranial nerves: There is good facial symmetry.  There is facial hypomimia.  The speech is fluent and clear.  He is hypophonic.  Soft palate rises symmetrically and there is no tongue deviation. Hearing is intact to conversational tone. Sensation: Sensation is intact to light touch throughout. Motor: Strength is at least antigravity x4.  Movement examination: Tone: There is normal tone in the UE/LE Abnormal movements:there is RUE resting tremor that increases with ambulation (hasn't taken med yet today) Coordination:  There is no decremation with any form of RAMS, including alternating supination and pronation of the forearm, hand opening and closing, finger taps, heel taps and toe taps bilaterally Gait and Station: The patient pushes off of  the chair to arise. He walks well.  He has re-emergent tremor on the right  LABS    Chemistry      Component Value Date/Time   NA 137 08/09/2017 0852   NA 139 07/08/2016 1158   K 3.7 08/09/2017 0852   CL 103 08/09/2017 0852   CO2 23 08/09/2017 0852   BUN 12 08/09/2017 0852   BUN 22 07/08/2016 1158   CREATININE 0.87 01/08/2018 0919      Component Value Date/Time   CALCIUM 8.8 (L) 08/09/2017 0852   ALKPHOS 49 02/26/2017 0922   AST 22 02/26/2017 0922   ALT 14 02/26/2017 0922   BILITOT 0.8 02/26/2017 0922     Lab Results  Component Value Date   TSH 1.59 02/26/2017   Lab Results  Component Value Date   VITAMINB12 202 08/05/2017     ASSESSMENT/PLAN:  1.  Idiopathic Parkinson's disease  -We discussed the diagnosis as well as pathophysiology of the disease.  We discussed treatment options as well as prognostic indicators.  Patient education was provided.  -continue neupro, 4 mg daily.  Discussed r/b/se of the medication extensively  -Continue with carbidopa/levodopa 25/100, 1 tablet three  times per day.  He was told to bring the last dosage away from bedtime.  Currently spreading dosages out too far  -invited to PARTS  2.  Rheumatoid arthritis  -seeing Dr. Trudie Moran. Feels that doing well in the office  3.  A-fib  -considering cardioversion  4.  Obstructive sleep apnea syndrome.  -Sleep study in August, 2019 demonstrated mild sleep apnea (AHI 7.1).  This to recommended CPAP given cardiovascular comorbidities. He is awaiting a CPAP titration  5.  F/u 5 months     Cc:  Samuel Lor, MD

## 2018-01-18 NOTE — Progress Notes (Signed)
Subjective: Samuel Moran is a 72 y.o. is seen today in office s/p right foot second, third metatarsal ostectomy preformed on 12/16/2017.  He presents today for evaluation of the wound.  He did get the Santyl and his friend has been changing the bandages daily.  Denies any drainage or pus or increase in swelling or redness and denies any pain to the area. He denies any systemic complaints including fevers, chills, nausea, vomiting.  No calf pain, chest pain, shortness of breath.  No other concerns today.  Objective: General: No acute distress, AAOx3 -presents today wearing a Darco wedge shoe. DP/PT pulses palpable 2/4, CRT < 3 sec to all digits.  Protective sensation intact. Motor function intact.  Right foot: Incision is well coapted without any evidence of dehiscence on the plantar aspect.  Some of the sutures have come loose.  The wound today after debridement measured 1.5 x 1.1 x 1 cm.  The wound overall appears to be about the same size but stays more granular and healthy.  Minimal fibrotic tissue is present within the wound.  There is no probing to bone, undermining or tunneling.  Decreased swelling to the foot there is no erythema or increase in warmth.  There is no pain. No pain with calf compression, swelling, warmth, erythema.   Assessment and Plan:  Status post right foot surgery  -Treatment options discussed including all alternatives, risks, and complications -Today I did debride the wound down to healthy, granular tissue utilizing #12 blade scalpel as well as a tissue nipper down to healthy, bleeding tissue.  Continue with Santyl dressing changes daily.  Continue offloading at all times.  Continue to recommend nonweightbearing but he does drive himself and has been wearing a Darco wedge shoe.  Encouraged elevation.  Finish course of Augmentin which would be 6 weeks post surgery. -We will check blood work including ESR, CRP, CBC.  Trula Slade DPM

## 2018-01-22 ENCOUNTER — Ambulatory Visit (INDEPENDENT_AMBULATORY_CARE_PROVIDER_SITE_OTHER): Payer: Medicare HMO | Admitting: Podiatry

## 2018-01-22 ENCOUNTER — Encounter: Payer: Self-pay | Admitting: Podiatry

## 2018-01-22 ENCOUNTER — Telehealth: Payer: Self-pay | Admitting: *Deleted

## 2018-01-22 VITALS — Temp 97.2°F

## 2018-01-22 DIAGNOSIS — L97512 Non-pressure chronic ulcer of other part of right foot with fat layer exposed: Secondary | ICD-10-CM | POA: Diagnosis not present

## 2018-01-22 NOTE — Telephone Encounter (Signed)
We talked about this today and he did not want to do it. Please have him come in next week to discuss.

## 2018-01-22 NOTE — Telephone Encounter (Signed)
Pt states he would like to have the surgery to close the wound on his foot before his vacation 02/11/2018. I told pt he would benefit from an appt to discuss surgery and sign consent. Pt states just have Dr. Leigh Aurora nurse call me.

## 2018-01-22 NOTE — Telephone Encounter (Signed)
Left message informing pt of Dr.Wagoner's recommendation.

## 2018-01-24 NOTE — Progress Notes (Signed)
Subjective: Samuel Moran is a 72 y.o. is seen today in office s/p right foot second, third metatarsal ostectomy preformed on 12/16/2017.  He presents today for evaluation of the wound.  He states he is doing well he has not had any significant drainage or has had no pus coming from the wound.  His swelling has been stable.  Denies any increase in swelling he denies any redness or red streaks.  He has no pain.  He has been continue with Santyl dressing changes daily. He denies any systemic complaints including fevers, chills, nausea, vomiting.  No calf pain, chest pain, shortness of breath.  No other concerns today.  Objective: General: No acute distress, AAOx3 -presents today wearing a Darco wedge shoe. DP/PT pulses palpable 2/4, CRT < 3 sec to all digits.  Protective sensation intact. Motor function intact.  Right foot: Incision is well coapted without any evidence of dehiscence on the plantar aspect.  Some of the sutures have come loose.  The wound today after debridement measured 2 x 1.1 x 1 cm.  There is no drainage or pus identified in this area today.  I did debride some of the fibrotic tissue present within the central aspect of the wound.  There is no probing to bone, undermining or tunneling.  There is no surrounding erythema, ascending cellulitis.  There is no fluctuation or crepitation. No pain with calf compression, swelling, warmth, erythema.   Assessment and Plan:  Status post right foot surgery  -Treatment options discussed including all alternatives, risks, and complications -Today I did debride the wound down to healthy, granular tissue utilizing #12 blade scalpel as well as a tissue nipper down to healthy, bleeding tissue.  Continue with Santyl dressing changes daily.  Continue offloading at all times, it is been several times recommend nonweightbearing however he is having difficulty doing this.  -I did discuss with him surgical intervention to debride the wound and washout and  possible partial closure of the wound to help facilitate wound healing.  Clinically there is no signs of infection he has been on antibiotics for quite some time.  Prior to this I do not get blood work including ESR, CRP, CBC.  With an x-ray next appointment and decide from there.  He does not want to proceed with any further surgery but discussing this would not be any bone work most likely.  *X-ray next appointment  Trula Slade DPM

## 2018-01-25 ENCOUNTER — Telehealth: Payer: Self-pay | Admitting: Podiatry

## 2018-01-25 DIAGNOSIS — M4186 Other forms of scoliosis, lumbar region: Secondary | ICD-10-CM | POA: Diagnosis not present

## 2018-01-25 DIAGNOSIS — M5137 Other intervertebral disc degeneration, lumbosacral region: Secondary | ICD-10-CM | POA: Diagnosis not present

## 2018-01-25 NOTE — Telephone Encounter (Signed)
Looks like he has been on the antibiotics for 6 weeks now so we can stop. If he starts to notice any increase in redness, drainage, swelling or any other signs of infection then he is to call me and go back on them.

## 2018-01-25 NOTE — Telephone Encounter (Signed)
I informed pt Dr. Jacqualyn Posey had said he needed to be seen to discuss and set up surgery. Pt states he has set up the surgery for the 02/03/2018. I asked when he last filled the antibiotic and he said 01/22/2018 and I told him, if was taking as prescribed he probably had 6.5 days left of the antibiotic and should complete it and not stop in the therapy. Pt states he would like Dr. Jacqualyn Posey to see if he needed to continue he was seen on Friday and just picked up a refill.

## 2018-01-25 NOTE — Telephone Encounter (Signed)
I'm a pt at [facility] or [facility]. They are wanting to do an epidural on my back but Dr. Jacqualyn Posey has me on amoxicillin. I need to be off the amoxicillin 5-7 days before I can do that and I was wondering when Dr. Jacqualyn Posey has me scheduled to come off the amoxicillin. My number is [phone]. Thank you.

## 2018-01-25 NOTE — Telephone Encounter (Signed)
I am returning your call.  Dr. Jacqualyn Posey said he can do your surgery on Wednesday but you need to come in to see him tomorrow.  "I have a doctors appointment about my back.  I need surgery on it too.  So I'm not sure if I can do it tomorrow.  I have to go to this appointment."  He can do your surgery on Wednesday of next week.  "Okay that's fine.  I have a Rheumatology appointment that day but I can reschedule it.  What time will I need to be there?"  Someone from the surgical center will give you a call a day or two prior to your surgery date.  They will give you your arrival time.  Would you like me to send you to a scheduler so you can make an appointment?  "I already have one scheduled for this Friday.  Is that okay?"  Yes, that date is fine.

## 2018-01-25 NOTE — Telephone Encounter (Signed)
I would like to speak to Dr. Jacqualyn Posey or his assistant about a possible procedure on my foot. He talked about it last week, saying he would put some stitches in my wound to close it up. I would like to do that before I go on vacation. If they could give me a call please at [phone]. Thank you.

## 2018-01-26 NOTE — Telephone Encounter (Signed)
I informed pt of Dr. Leigh Aurora 01/25/2018 5:15pm orders. Pt asked if he could take ibuprofen and I told pt he would need to discuss with the doctor who was to give the epidural injection.

## 2018-01-27 ENCOUNTER — Telehealth: Payer: Self-pay | Admitting: Podiatry

## 2018-01-27 NOTE — Telephone Encounter (Signed)
Patient called stating he was at orthopedic doctor and a they are wanting to do a procedure.(male in the background said epideral) and they need to make sure no infection is in his foot.   Per Dr Jacqualyn Posey clinically there did not appear to be any infection at his last visit. I relayed this to the patient and told pt that if he needed a note sent over to let us know.

## 2018-01-28 ENCOUNTER — Other Ambulatory Visit: Payer: Self-pay | Admitting: Family Medicine

## 2018-01-29 ENCOUNTER — Ambulatory Visit (INDEPENDENT_AMBULATORY_CARE_PROVIDER_SITE_OTHER): Payer: Medicare HMO | Admitting: Podiatry

## 2018-01-29 ENCOUNTER — Telehealth: Payer: Self-pay | Admitting: Cardiology

## 2018-01-29 ENCOUNTER — Ambulatory Visit (INDEPENDENT_AMBULATORY_CARE_PROVIDER_SITE_OTHER): Payer: Medicare HMO

## 2018-01-29 VITALS — BP 149/83 | HR 80 | Temp 97.9°F

## 2018-01-29 DIAGNOSIS — L97512 Non-pressure chronic ulcer of other part of right foot with fat layer exposed: Secondary | ICD-10-CM

## 2018-01-29 NOTE — Telephone Encounter (Signed)
Wants a call about setting up his cardioversion

## 2018-01-29 NOTE — Patient Instructions (Signed)

## 2018-02-01 ENCOUNTER — Telehealth: Payer: Self-pay | Admitting: *Deleted

## 2018-02-01 ENCOUNTER — Ambulatory Visit: Payer: Medicare HMO

## 2018-02-01 DIAGNOSIS — L97512 Non-pressure chronic ulcer of other part of right foot with fat layer exposed: Secondary | ICD-10-CM | POA: Diagnosis not present

## 2018-02-01 NOTE — Telephone Encounter (Signed)
Called and left a message for the patient that he needed to go and get blood work before the surgery. Samuel Moran

## 2018-02-01 NOTE — Progress Notes (Signed)
Patient is here today for dressing change to his right foot, currently has an ulcer to right foot with fat layer exposed.  He states today that he is ready for his surgery is Wednesday.  Remove soiled dressing, cleansed wound with sterile saline.  Packed wound with iodoform and dry sterile gauze was applied.  I did print up patient's blood work orders and advised him to go get his blood work done prior to his surgery.  He is to follow-up after his surgery.  Or sooner if he needs to

## 2018-02-01 NOTE — Telephone Encounter (Signed)
Left message for patient to return call.

## 2018-02-01 NOTE — Progress Notes (Addendum)
Subjective: Samuel Moran is a 72 y.o. is seen today in office s/p right foot second, third metatarsal ostectomy preformed on 12/16/2017.  He presents today for follow-up evaluation of the wound.  He has been using the Santyl on the wound daily and he feels the wound is getting better.  He gets occasional bleeding on the bandage but overall denies any pus.  He is no longer any antibiotics he has not noticed any increase in swelling or redness he has no pain to the area.  He today presents wearing a regular shoe walking. He denies any systemic complaints including fevers, chills, nausea, vomiting.  No calf pain, chest pain, shortness of breath.  No other concerns today.  Objective: General: No acute distress, AAOx3 -presents today wearing a Darco wedge shoe. DP/PT pulses palpable 2/4, CRT < 3 sec to all digits.  Protective sensation intact. Motor function intact.  Right foot: Incision is well coapted without any evidence of dehiscence on the plantar aspect.  The wound does appear to be improved.  On the inferior portion and is more granular however proximally there is still an area probing.  All that much of the same and the deepest areas the wound overall appears to be healthier more granular and starting to fill in.  No pain with calf compression, swelling, warmth, erythema.   Assessment and Plan:  Status post right foot surgery  -Treatment options discussed including all alternatives, risks, and complications -Today I did debride the wound down to healthy, granular tissue utilizing #12 blade scalpel as well as a tissue nipper down to healthy, bleeding tissue.  Continue with Santyl dressing changes daily.  Although the wound is getting better he would like to go to surgery to clean the wound as well as possibly partially close the wound.  Due to this was beneficial at this point.  Completed antibiotics.  X-rays were obtained and reviewed today there is no definitive evidence of acute osteomyelitis.   A lot of the area of the third metatarsal phalangeal joint there is a small area of packing identified.  I do want to recheck blood work prior to surgery.  This was given to him again today. -We discussed wound debridement, partial closure of the wound.  He wishes to proceed with this after discussing alternatives, risks, complications. -The incision placement as well as the postoperative course was discussed with the patient. I discussed risks of the surgery which include, but not limited to, infection, bleeding, pain, swelling, need for further surgery, delayed or nonhealing, painful or ugly scar, numbness or sensation changes, over/under correction, recurrence, transfer lesions, further deformity, hardware failure, DVT/PE, loss of toe/foot. Patient understands these risks and wishes to proceed with surgery. The surgical consent was reviewed with the patient all 3 pages were signed. No promises or guarantees were given to the outcome of the procedure. All questions were answered to the best of my ability. Before the surgery the patient was encouraged to call the office if there is any further questions. The surgery will be performed at the [facility] on an outpatient basis. -Strongly advised him to wear at least the Darco offloading shoe although I recommend nonweightbearing to this entire process.  He presents today wearing a regular shoe when driving  Trula Slade DPM

## 2018-02-02 ENCOUNTER — Ambulatory Visit: Payer: Medicare HMO | Admitting: Neurology

## 2018-02-02 LAB — CBC WITH DIFFERENTIAL/PLATELET
BASOS ABS: 51 {cells}/uL (ref 0–200)
Basophils Relative: 0.6 %
EOS PCT: 4.7 %
Eosinophils Absolute: 400 cells/uL (ref 15–500)
HEMATOCRIT: 40.2 % (ref 38.5–50.0)
HEMOGLOBIN: 13.1 g/dL — AB (ref 13.2–17.1)
LYMPHS ABS: 1564 {cells}/uL (ref 850–3900)
MCH: 26.8 pg — ABNORMAL LOW (ref 27.0–33.0)
MCHC: 32.6 g/dL (ref 32.0–36.0)
MCV: 82.4 fL (ref 80.0–100.0)
MPV: 10.4 fL (ref 7.5–12.5)
Monocytes Relative: 8.7 %
NEUTROS ABS: 5746 {cells}/uL (ref 1500–7800)
NEUTROS PCT: 67.6 %
Platelets: 288 10*3/uL (ref 140–400)
RBC: 4.88 10*6/uL (ref 4.20–5.80)
RDW: 17.7 % — AB (ref 11.0–15.0)
Total Lymphocyte: 18.4 %
WBC: 8.5 10*3/uL (ref 3.8–10.8)
WBCMIX: 740 {cells}/uL (ref 200–950)

## 2018-02-02 LAB — SEDIMENTATION RATE: Sed Rate: 2 mm/h (ref 0–20)

## 2018-02-02 LAB — C-REACTIVE PROTEIN: CRP: 13 mg/L — ABNORMAL HIGH (ref <8.0)

## 2018-02-02 NOTE — Telephone Encounter (Signed)
Patient reports he is supposed to be schedule for a cardioversion. I will verify with Dr. Agustin Cree and notify patient tomorrow.

## 2018-02-02 NOTE — Telephone Encounter (Signed)
Returned call

## 2018-02-03 ENCOUNTER — Encounter: Payer: Self-pay | Admitting: Podiatry

## 2018-02-03 ENCOUNTER — Other Ambulatory Visit: Payer: Self-pay | Admitting: Podiatry

## 2018-02-03 DIAGNOSIS — K219 Gastro-esophageal reflux disease without esophagitis: Secondary | ICD-10-CM | POA: Diagnosis not present

## 2018-02-03 DIAGNOSIS — L97512 Non-pressure chronic ulcer of other part of right foot with fat layer exposed: Secondary | ICD-10-CM | POA: Diagnosis not present

## 2018-02-03 DIAGNOSIS — L97905 Non-pressure chronic ulcer of unspecified part of unspecified lower leg with muscle involvement without evidence of necrosis: Secondary | ICD-10-CM | POA: Diagnosis not present

## 2018-02-03 DIAGNOSIS — L97519 Non-pressure chronic ulcer of other part of right foot with unspecified severity: Secondary | ICD-10-CM | POA: Diagnosis not present

## 2018-02-03 MED ORDER — HYDROCODONE-ACETAMINOPHEN 5-325 MG PO TABS: 1.0000 | ORAL_TABLET | Freq: Four times a day (QID) | ORAL | 0 refills | Status: DC | PRN

## 2018-02-03 MED ORDER — AMOXICILLIN-POT CLAVULANATE 875-125 MG PO TABS: 1.0000 | ORAL_TABLET | Freq: Two times a day (BID) | ORAL | 0 refills | Status: DC

## 2018-02-03 NOTE — Telephone Encounter (Signed)
Left message for patient to return call.

## 2018-02-03 NOTE — Progress Notes (Signed)
Postop meds sent to pharmacy

## 2018-02-03 NOTE — Telephone Encounter (Signed)
What is the status of the sleep study? If he doesn't have it, schedule for cardioversion

## 2018-02-04 ENCOUNTER — Telehealth: Payer: Self-pay | Admitting: *Deleted

## 2018-02-04 NOTE — Telephone Encounter (Signed)
Left message for patient to return call.

## 2018-02-04 NOTE — Telephone Encounter (Signed)
Called and left a message for the patient stating that I was checking on the patient after having surgery yesterday with Dr Jacqualyn Posey and stated to call the office at [phone]. Samuel Moran

## 2018-02-05 ENCOUNTER — Encounter: Payer: Self-pay | Admitting: Podiatry

## 2018-02-05 ENCOUNTER — Ambulatory Visit (INDEPENDENT_AMBULATORY_CARE_PROVIDER_SITE_OTHER): Payer: Medicare HMO | Admitting: Podiatry

## 2018-02-05 ENCOUNTER — Ambulatory Visit: Payer: Medicare HMO

## 2018-02-05 VITALS — Temp 97.9°F

## 2018-02-05 DIAGNOSIS — L97512 Non-pressure chronic ulcer of other part of right foot with fat layer exposed: Secondary | ICD-10-CM

## 2018-02-05 NOTE — Progress Notes (Signed)
Subjective: Samuel Moran is a 72 y.o. is seen today in office s/p right foot wound excision, closure preformed on 02/03/2018.  He is doing well.  He is remained in the Darco wedge shoe. He did drive here today.  He had some mild discomfort but not taking any pain medication.  Is been on antibiotics as directed.  Denies any systemic complaints such as fevers, chills, nausea, vomiting. No calf pain, chest pain, shortness of breath.   Objective: General: No acute distress, AAOx3  DP/PT pulses palpable 2/4, CRT < 3 sec to all digits.  Protective sensation intact. Motor function intact.  RIGHT foot: Incision is well coapted without any evidence of dehiscence and sutures are intact.  There is mild macerated tissue along the incision site but otherwise appears to be well coapted.  There is no surrounding erythema, ascending cellulitis, fluctuance, crepitus, malodor, drainage/purulence. There is trace edema around the surgical site. There is no significant pain along the surgical site.  No other areas of tenderness to bilateral lower extremities.  No other open lesions or pre-ulcerative lesions.  No pain with calf compression, swelling, warmth, erythema.   Assessment and Plan:  Status post right foot surgery, doing well with no complications   -Treatment options discussed including all alternatives, risks, and complications -Incision appears to be healing well without any signs of infection.  Betadine to dry dressing was applied followed by dry sterile dressing. -Continue Darco wedge shoe and encouraged nonweightbearing.  Recommend to hold off on driving. -Ice/elevation -Pain medication as needed. -Monitor for any clinical signs or symptoms of infection and DVT/PE and directed to call the office immediately should any occur or go to the ER. -Follow-up on Monday for dressing change or sooner if any problems arise. In the meantime, encouraged to call the office with any questions, concerns, change in  symptoms.   Celesta Gentile, DPM

## 2018-02-08 ENCOUNTER — Ambulatory Visit (INDEPENDENT_AMBULATORY_CARE_PROVIDER_SITE_OTHER): Payer: Self-pay

## 2018-02-08 DIAGNOSIS — Z9889 Other specified postprocedural states: Secondary | ICD-10-CM

## 2018-02-08 NOTE — Telephone Encounter (Signed)
Called patient back on this number [phone], no answer and no option to leave voicemail.

## 2018-02-08 NOTE — Telephone Encounter (Signed)
Spoke to patient. Patient did have sleep study will inform Dr. Agustin Cree.

## 2018-02-09 DIAGNOSIS — M5137 Other intervertebral disc degeneration, lumbosacral region: Secondary | ICD-10-CM | POA: Diagnosis not present

## 2018-02-09 NOTE — Telephone Encounter (Signed)
Per Dr. Agustin Cree he would like patient to be set up with cpap machine before cardioversion is attempted. I have asked Barry Brunner who coordinates sleep studies to follow up on this and get in touch with me. Patient informed. Will update patient when more information is provided.

## 2018-02-09 NOTE — Telephone Encounter (Signed)
Left message for patient to return call.

## 2018-02-10 ENCOUNTER — Ambulatory Visit (INDEPENDENT_AMBULATORY_CARE_PROVIDER_SITE_OTHER): Payer: Self-pay

## 2018-02-10 DIAGNOSIS — Z09 Encounter for follow-up examination after completed treatment for conditions other than malignant neoplasm: Secondary | ICD-10-CM

## 2018-02-10 NOTE — Telephone Encounter (Signed)
Left message for patient to return call.

## 2018-02-11 NOTE — Telephone Encounter (Signed)
Left message for patient to return call.

## 2018-02-12 NOTE — Progress Notes (Signed)
Patient is here today for follow-up appointment status post right foot surgery.  He denies any complications at this time.  Patient did present to the office walking regular shoe, despite advice from Dr. to walk in his wedge surgical shoe.  Patient denies fever, chills, nausea/vomiting.  Surgical incision appears to be healing well, no erythema, no swelling, no redness, no drainage coming from the incision site.  All sutures intact no gapping noted.  Patient was also seen and evaluated by Dr. Jacqualyn Posey.  Betadine with gauze applied to surgical incision and dry sterile dressing applied to entire foot.  Patient is to keep his current appointment to have his dressing changed this Wednesday, or return sooner with any acute symptom changes.

## 2018-02-12 NOTE — Progress Notes (Signed)
Patient is here today for follow-up appointment status post right foot surgery.  He denies any complications at this time.  Patient did present to the office walking in regular shoe, despite advice of from the doctor to walk in his wedge surgical shoe.  Patient denies fever,  chills, Nausea/ vomiting.  Surgical incision appears to be healing well, no erythema, no swelling, no redness, no drainage coming from incision site.  All sutures intact, with no gapping noted.  Overall patient's foot appears to be healing well.  He has had a decrease in swelling since last visit.  Betadine applied with dry sterile dressing.     Patient is going out of town to [place] we discussed use of wheelchair, and Darco wedge shoe.  I stressed to him the importance of minimal ambulation on his foot.  We discussed daily dressing changes Betadine over the incision, with clean sterile gauze over top.  We also discussed signs and symptoms of infection.  Patient verbalized understanding, and states that he is going to be using a wheelchair for the most of the trip.  He is to follow-up as soon as he gets back from his trip or call with any questions or concerns.

## 2018-02-12 NOTE — Telephone Encounter (Signed)
Spoke to patient informed him to contact [employer] per Dr. Agustin Cree request to get sleep apnea under control before cardioversion. Explained rationale for this. Patient verbally understands and will have this completed once he returns from vacation next week. I left [employer] number on his home voicemail per his request.

## 2018-02-21 ENCOUNTER — Other Ambulatory Visit: Payer: Self-pay | Admitting: Family Medicine

## 2018-02-23 ENCOUNTER — Ambulatory Visit (INDEPENDENT_AMBULATORY_CARE_PROVIDER_SITE_OTHER): Payer: Medicare HMO | Admitting: Podiatry

## 2018-02-23 VITALS — Temp 98.0°F

## 2018-02-23 DIAGNOSIS — T8130XA Disruption of wound, unspecified, initial encounter: Secondary | ICD-10-CM | POA: Diagnosis not present

## 2018-02-23 NOTE — Progress Notes (Signed)
Subjective: Samuel Moran is a 72 y.o. is seen today in office s/p right foot wound excision, closure preformed on 02/03/2018.  Since I last saw him he is going to [place] he has been doing a lot of walking but he states he has entrance the operative suite as much as possible.  Is been putting Betadine on the incision daily followed by a Band-Aid.  He states that the foot is doing well.  Denies any drainage or pus or any swelling or redness that he can recall.  He has no other concerns today. Denies any systemic complaints such as fevers, chills, nausea, vomiting. No calf pain, chest pain, shortness of breath.   Objective: General: No acute distress, AAOx3  DP/PT pulses palpable 2/4, CRT < 3 sec to all digits.  Protective sensation intact. Motor function intact.  RIGHT foot: Incision has dehisced and is opened up and has macerated tissue as well as hyperkeratotic tissue along the periphery of the wound.  After debridement the wound measured 4 x 0.7 cm with a depth of approximately 1 cm.  Fibroglandular wound base is present after debridement.  There is no surrounding erythema, ascending cellulitis.  There is no fluctuation or crepitation or any malodor.  There is no clinical signs of infection.  Minimal swelling to the foot. No other open lesions or pre-ulcerative lesions.  No pain with calf compression, swelling, warmth, erythema.   Assessment and Plan:  Status post right foot surgery, doing well with no complications   -Treatment options discussed including all alternatives, risks, and complications -I sharply debrided the wound without any complications down to healthy, bleeding tissue utilizing tissue nipper as well as with a #312 blade scalpel.  We will switch to Santyl dressing changes daily and he already has this at home.  Continue Darco wedge shoe at all times.  He presents today wearing a regular shoe and advised against this.  Continue elevation and compression. -Monitor for any clinical  signs or symptoms of infection and directed to call the office immediately should any occur or go to the ER.  Trula Slade DPM

## 2018-02-24 DIAGNOSIS — G4733 Obstructive sleep apnea (adult) (pediatric): Secondary | ICD-10-CM | POA: Diagnosis not present

## 2018-02-28 DIAGNOSIS — M75101 Unspecified rotator cuff tear or rupture of right shoulder, not specified as traumatic: Secondary | ICD-10-CM | POA: Diagnosis not present

## 2018-03-02 ENCOUNTER — Ambulatory Visit (INDEPENDENT_AMBULATORY_CARE_PROVIDER_SITE_OTHER): Payer: Medicare HMO | Admitting: Podiatry

## 2018-03-02 DIAGNOSIS — M5137 Other intervertebral disc degeneration, lumbosacral region: Secondary | ICD-10-CM | POA: Diagnosis not present

## 2018-03-02 DIAGNOSIS — M25511 Pain in right shoulder: Secondary | ICD-10-CM | POA: Diagnosis not present

## 2018-03-02 DIAGNOSIS — T8130XA Disruption of wound, unspecified, initial encounter: Secondary | ICD-10-CM

## 2018-03-02 DIAGNOSIS — M419 Scoliosis, unspecified: Secondary | ICD-10-CM | POA: Diagnosis not present

## 2018-03-02 NOTE — Progress Notes (Signed)
Subjective: Samuel Moran is a 72 y.o. is seen today in office s/p right foot wound excision, closure preformed on 02/03/2018. He states that his friend has been doing Santyl dressing changes daily.  He denies any drainage or pus coming from the area denies any redness or swelling to his foot or any changes in regards to the swelling.  He did fall as he was trying to lean against a car and he did fall injuring his right shoulder and he is seeing orthopedics today for this.  No injury to his lower extremity. Denies any systemic complaints such as fevers, chills, nausea, vomiting. No calf pain, chest pain, shortness of breath.   Objective: General: No acute distress, AAOx3  DP/PT pulses palpable 2/4, CRT < 3 sec to all digits.  Protective sensation intact. Motor function intact.  RIGHT foot: Wound on the plantar aspect of the foot today is smaller measuring 3.1 x 0.8 x 1 cm.  Does not probe to bone.  Some fibrotic tissue present in the wound after debridement is down to healthy, granular wound base.  There is no undermining or tunneling.  There is no fluctuation or crepitation there is no malodor.  No ascending cellulitis.  Minimal swelling to the foot and overall this is unchanged. No other open lesions or pre-ulcerative lesions.  No pain with calf compression, swelling, warmth, erythema.   Assessment and Plan:  Status post right foot surgery, doing well with no complications   -Treatment options discussed including all alternatives, risks, and complications -I sharply debrided the wound without any complications down to healthy, bleeding tissue utilizing tissue nipper as well as with a #312 blade scalpel.  Continue Santyl dressing changes daily and recommend offloading at all times. -Monitor for any clinical signs or symptoms of infection and directed to call the office immediately should any occur or go to the ER.  Return in about 1 week (around 03/09/2018).  Trula Slade DPM

## 2018-03-03 DIAGNOSIS — M25511 Pain in right shoulder: Secondary | ICD-10-CM | POA: Diagnosis not present

## 2018-03-04 DIAGNOSIS — R69 Illness, unspecified: Secondary | ICD-10-CM | POA: Diagnosis not present

## 2018-03-04 DIAGNOSIS — Z23 Encounter for immunization: Secondary | ICD-10-CM | POA: Diagnosis not present

## 2018-03-08 ENCOUNTER — Encounter: Payer: Self-pay | Admitting: Cardiology

## 2018-03-08 ENCOUNTER — Ambulatory Visit (INDEPENDENT_AMBULATORY_CARE_PROVIDER_SITE_OTHER): Payer: Medicare HMO | Admitting: Cardiology

## 2018-03-08 VITALS — BP 146/72 | HR 77 | Wt 214.8 lb

## 2018-03-08 DIAGNOSIS — I10 Essential (primary) hypertension: Secondary | ICD-10-CM | POA: Diagnosis not present

## 2018-03-08 DIAGNOSIS — G20 Parkinson's disease: Secondary | ICD-10-CM | POA: Diagnosis not present

## 2018-03-08 DIAGNOSIS — E785 Hyperlipidemia, unspecified: Secondary | ICD-10-CM

## 2018-03-08 DIAGNOSIS — I4819 Other persistent atrial fibrillation: Secondary | ICD-10-CM | POA: Diagnosis not present

## 2018-03-08 DIAGNOSIS — G20A1 Parkinson's disease without dyskinesia, without mention of fluctuations: Secondary | ICD-10-CM

## 2018-03-08 NOTE — Progress Notes (Signed)
Cardiology Office Note:    Date:  03/08/2018   ID:  Samuel Moran, DOB [birth year], MRN [id number]  PCP:  Samuel Lor, MD  Cardiologist:  Samuel Campus, MD    Referring MD: Samuel Lor, MD   Chief Complaint  Patient presents with  . Follow-up  Doing well cardiac wise  History of Present Illness:    Samuel Moran is a 72 y.o. male with persistent atrial fibrillation.  We delaying cardioversion awaiting his sleep apnea and management to be started and continue and now he is back in our office to talk about cardioversion.  He has been using CPAP mask for a couple weeks already and he is doing well from that point review.  He does have parkinsonian couple weeks ago he fell down and he injured his right shoulder he is going to have MRI done at the end of this week to check for potential what work-up.  He been seeing orthopedics for that also does have a chronic wound on his heel but that is getting much better.  Will recently went to [place] was able to walk around but complained that he had difficulty walking especially uphill.  This is something he did not have a problem before.  Denies have any chest pain tightness squeezing pressure burning chest.  Past Medical History:  Diagnosis Date  . Atrial fibrillation ([place])    onset 07/17/17  . Dysrhythmia   . ED (erectile dysfunction)   . GERD 07/01/2007  . Parkinson's disease ([place])   . 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 PROSTATE SPECIFIC ANTIGEN, ELEVATED 06/29/2007  . Rheumatoid arthritis ([place])   . SEIZURE DISORDER 07/01/2007   one epsiode only  . Spinal stenosis of lumbar region 09/22/2014    Past Surgical History:  Procedure Laterality Date  . AMPUTATION     right great toe - tramatic age 26  . COLONOSCOPY    . DEBRIDEMENT TOE Right 07/2016  . HAND SURGERY  03/2017   removal cysts  . IR FLUORO GUIDE CV LINE RIGHT  10/16/2016  . IR US GUIDE VASC ACCESS RIGHT  10/16/2016  . OSTECTOMY Right 08/05/2017   Procedure: OSTECTOMY COMPLETE METARSAL HEAD FIRST RIGHT;  Surgeon: Trula Slade, DPM;  Location: [facility];  Service: Podiatry;  Laterality: Right;    Current Medications: Current Meds  Medication Sig  . apixaban (ELIQUIS) 5 MG TABS tablet Eliquis 5 mg tablet  TAKE 1 TABLET BY MOUTH TWICE A DAY  . carbidopa-levodopa (SINEMET IR) 25-100 MG tablet TAKE 1 TABLET BY MOUTH THREE TIMES A DAY  . collagenase (SANTYL) ointment Apply 1 application topically daily.  . metoprolol succinate (TOPROL-XL) 50 MG 24 hr tablet TAKE 1 TABLET (50 MG TOTAL) BY MOUTH DAILY. TAKE WITH OR IMMEDIATELY FOLLOWING A MEAL.  Marland Kitchen NEUPRO 4 MG/24HR PLACE 1 PATCH ONTO THE SKIN DAILY.     Allergies:   Patient has no known allergies.   Social History   Socioeconomic History  . Marital status: Single    Spouse name: Not on file  . Number of children: Not on file  . Years of education: Not on file  . Highest education level: Not on file  Occupational History  . Occupation: Chartered certified accountant  Social Needs  . Financial resource strain: Not on file  . Food insecurity:    Worry: Not on file    Inability: Not on file  . Transportation needs:    Medical: Not on file    Non-medical: Not  on file  Tobacco Use  . Smoking status: Light Tobacco Smoker    Types: Cigars  . Smokeless tobacco: Never Used  . Tobacco comment: 1 cigar, takes 3 weeks  Substance and Sexual Activity  . Alcohol use: Yes    Alcohol/week: 4.0 standard drinks    Types: 4 Standard drinks or equivalent per week    Comment: one drink a day (liquor)  . Drug use: No  . Sexual activity: Not on file  Lifestyle  . Physical activity:    Days per week: Not on file    Minutes per session: Not on file  . Stress: Not on file  Relationships  . Social connections:    Talks on phone: Not on file    Gets together: Not on file    Attends religious service: Not on file    Active member of club or organization: Not on file    Attends meetings of clubs or  organizations: Not on file    Relationship status: Not on file  Other Topics Concern  . Not on file  Social History Narrative  . Not on file     Family History: The patient's family history includes Alcoholism in his mother; Arthritis in his father and mother. There is no history of Colon cancer, Esophageal cancer, Rectal cancer, or Stomach cancer. ROS:   Please see the history of present illness.    All 14 point review of systems negative except as described per history of present illness  EKGs/Labs/Other Studies Reviewed:      Recent Labs: 08/05/2017: Magnesium 1.9 08/09/2017: BUN 12; Potassium 3.7; Sodium 137 01/08/2018: Creat 0.87 02/01/2018: Hemoglobin 13.1; Platelets 288  Recent Lipid Panel    Component Value Date/Time   CHOL 168 02/26/2017 0922   TRIG 55.0 02/26/2017 0922   HDL 52.90 02/26/2017 0922   CHOLHDL 3 02/26/2017 0922   VLDL 11.0 02/26/2017 0922   LDLCALC 104 (H) 02/26/2017 0922    Physical Exam:    VS:  BP (!) 146/72   Pulse 77   Wt 214 lb 12.8 oz (97.4 kg)   SpO2 98%   BMI 29.96 kg/m     Wt Readings from Last 3 Encounters:  03/08/18 214 lb 12.8 oz (97.4 kg)  01/15/18 210 lb (95.3 kg)  01/01/18 212 lb (96.2 kg)     GEN:  Well nourished, well developed in no acute distress HEENT: Normal NECK: No JVD; No carotid bruits LYMPHATICS: No lymphadenopathy CARDIAC: Irregularly irregular, no murmurs, no rubs, no gallops RESPIRATORY:  Clear to auscultation without rales, wheezing or rhonchi  ABDOMEN: Soft, non-tender, non-distended MUSCULOSKELETAL:  No edema; No deformity  SKIN: Warm and dry LOWER EXTREMITIES: no swelling NEUROLOGIC:  Alert and oriented x 3 PSYCHIATRIC:  Normal affect   ASSESSMENT:    1. Persistent atrial fibrillation   2. Essential hypertension   3. Dyslipidemia   4. Parkinson disease ([place])    PLAN:    In order of problems listed above:  1. Persistent atrial fibrillation chads 2 Vascor equals 2.  Anticoagulated which I will  continue.  He took his Eliquis religiously every single day.  I described cardioversion to him he agreed to proceed all risk benefits as well as alternatives were discussed. 2. Essential hypertension blood pressure controlled we will continue present management. 3. His LDL is 104 which is not far from target.  Hopefully he will be able to be more active after cardioversion. 4. Parkinson disease.  Doing well from that point review  followed by neurology.   Medication Adjustments/Labs and Tests Ordered: Current medicines are reviewed at length with the patient today.  Concerns regarding medicines are outlined above.  No orders of the defined types were placed in this encounter.  Medication changes: No orders of the defined types were placed in this encounter.   Signed, Park Liter, MD, [facility] 03/08/2018 10:51 AM    [place]

## 2018-03-08 NOTE — Patient Instructions (Addendum)
Medication Instructions:  Your physician recommends that you continue on your current medications as directed. Please refer to the Current Medication list given to you today.  If you need a refill on your cardiac medications before your next appointment, please call your pharmacy.   Lab work: Your physician recommends that you return for lab work today: CBC, BMP  If you have labs (blood work) drawn today and your tests are completely normal, you will receive your results only by: Marland Kitchen MyChart Message (if you have MyChart) OR . A paper copy in the mail If you have any lab test that is abnormal or we need to change your treatment, we will call you to review the results.  Testing/Procedures:  Your physician has requested that you have a Cardioversion. . Electrical Cardioversion uses a jolt of electricity to your heart either through paddles or wired patches attached to your chest. This is a controlled, usually prescheduled, procedure. This procedure is done at the hospital and you are not awake during the procedure. You usually go home the day of the procedure. Please see the instruction sheet given to you today for more information.  Dear Samuel Moran,  You are scheduled for a Cardioversion on Monday November 11th 2019 with Dr. Debara Pickett..  Please arrive at the [facility] (Main Entrance A) at [facility]: [address] [place], [place] [address] at 7:45 am/pm.   DIET: Nothing to eat or drink after midnight except a sip of water with medications (see medication instructions below)  Medication Instructions:   Continue your anticoagulant: Eliquis You will need to continue your anticoagulant after your procedure until you are told by your Provider that it is safe to stop   Labs: You will have lab work today.     You must have a responsible person to drive you home and stay in the waiting area during your procedure. Failure to do so could result in cancellation.  Bring your insurance  cards.  *Special Note: Every effort is made to have your procedure done on time. Occasionally there are emergencies that occur at the hospital that may cause delays. Please be patient if a delay does occur.       Follow-Up: At [facility], you and your health needs are our priority.  As part of our continuing mission to provide you with exceptional heart care, we have created designated Provider Care Teams.  These Care Teams include your primary Cardiologist (physician) and Advanced Practice Providers (APPs -  Physician Assistants and Nurse Practitioners) who all work together to provide you with the care you need, when you need it. You will need a follow up appointment in 1 months.  Please call our office 2 months in advance to schedule this appointment.  You may see No primary care provider on file. or another member of our [employer] Provider Team in [place]: Shirlee More, MD . Jyl Heinz, MD  Any Other Special Instructions Will Be Listed Below (If Applicable).   Electrical Cardioversion Electrical cardioversion is the delivery of a jolt of electricity to restore a normal rhythm to the heart. A rhythm that is too fast or is not regular keeps the heart from pumping well. In this procedure, sticky patches or metal paddles are placed on the chest to deliver electricity to the heart from a device. This procedure may be done in an emergency if:  There is low or no blood pressure as a result of the heart rhythm.  Normal rhythm must  be restored as fast as possible to protect the brain and heart from further damage.  It may save a life.  This procedure may also be done for irregular or fast heart rhythms that are not immediately life-threatening. Tell a health care provider about:  Any allergies you have.  All medicines you are taking, including vitamins, herbs, eye drops, creams, and over-the-counter medicines.  Any problems you or family members have had with anesthetic  medicines.  Any blood disorders you have.  Any surgeries you have had.  Any medical conditions you have.  Whether you are pregnant or may be pregnant. What are the risks? Generally, this is a safe procedure. However, problems may occur, including:  Allergic reactions to medicines.  A blood clot that breaks free and travels to other parts of your body.  The possible return of an abnormal heart rhythm within hours or days after the procedure.  Your heart stopping (cardiac arrest). This is rare.  What happens before the procedure? Medicines  Your health care provider may have you start taking: ? Blood-thinning medicines (anticoagulants) so your blood does not clot as easily. ? Medicines may be given to help stabilize your heart rate and rhythm.  Ask your health care provider about changing or stopping your regular medicines. This is especially important if you are taking diabetes medicines or blood thinners. General instructions  Plan to have someone take you home from the hospital or clinic.  If you will be going home right after the procedure, plan to have someone with you for 24 hours.  Follow instructions from your health care provider about eating or drinking restrictions. What happens during the procedure?  To lower your risk of infection: ? Your health care team will wash or sanitize their hands. ? Your skin will be washed with soap.  An IV tube will be inserted into one of your veins.  You will be given a medicine to help you relax (sedative).  Sticky patches (electrodes) or metal paddles may be placed on your chest.  An electrical shock will be delivered. The procedure may vary among health care providers and hospitals. What happens after the procedure?  Your blood pressure, heart rate, breathing rate, and blood oxygen level will be monitored until the medicines you were given have worn off.  Do not drive for 24 hours if you were given a sedative.  Your  heart rhythm will be watched to make sure it does not change. This information is not intended to replace advice given to you by your health care provider. Make sure you discuss any questions you have with your health care provider. Document Released: 04/11/2002 Document Revised: 12/19/2015 Document Reviewed: 10/26/2015 Elsevier Interactive Patient Education  2017 [employer].

## 2018-03-09 LAB — BASIC METABOLIC PANEL
BUN/Creatinine Ratio: 24 (ref 10–24)
BUN: 21 mg/dL (ref 8–27)
CALCIUM: 9 mg/dL (ref 8.6–10.2)
CHLORIDE: 103 mmol/L (ref 96–106)
CO2: 22 mmol/L (ref 20–29)
Creatinine, Ser: 0.87 mg/dL (ref 0.76–1.27)
GFR calc Af Amer: 101 mL/min/{1.73_m2} (ref >59)
GFR calc non Af Amer: 87 mL/min/{1.73_m2} (ref >59)
GLUCOSE: 92 mg/dL (ref 65–99)
POTASSIUM: 4.2 mmol/L (ref 3.5–5.2)
Sodium: 138 mmol/L (ref 134–144)

## 2018-03-09 LAB — CBC
Hematocrit: 42.9 % (ref 37.5–51.0)
Hemoglobin: 13.6 g/dL (ref 13.0–17.7)
MCH: 27.8 pg (ref 26.6–33.0)
MCHC: 31.7 g/dL (ref 31.5–35.7)
MCV: 88 fL (ref 79–97)
PLATELETS: 256 10*3/uL (ref 150–450)
RBC: 4.89 x10E6/uL (ref 4.14–5.80)
RDW: 14.7 % (ref 12.3–15.4)
WBC: 6.6 10*3/uL (ref 3.4–10.8)

## 2018-03-10 DIAGNOSIS — M5136 Other intervertebral disc degeneration, lumbar region: Secondary | ICD-10-CM | POA: Diagnosis not present

## 2018-03-11 ENCOUNTER — Encounter: Payer: Self-pay | Admitting: Podiatry

## 2018-03-11 ENCOUNTER — Ambulatory Visit (INDEPENDENT_AMBULATORY_CARE_PROVIDER_SITE_OTHER): Payer: Medicare HMO | Admitting: Podiatry

## 2018-03-11 DIAGNOSIS — L97512 Non-pressure chronic ulcer of other part of right foot with fat layer exposed: Secondary | ICD-10-CM | POA: Diagnosis not present

## 2018-03-11 NOTE — Progress Notes (Signed)
Subjective: Samuel Moran is a 72 y.o. is seen today in office s/p right foot wound excision, closure preformed on 02/03/2018.  He has been continue with Santyl dressing changes daily and his friend has been doing it daily he thinks the wound is been getting better.  Denies any purulence picking is some bloody drainage and the pain is daily.  Denies any increase in swelling denies any redness or warmth to his foot.  Overall he is doing well to his foot.  He is getting MRI in his right shoulder this weekend.  He has no other concerns today.  Denies any systemic complaints such as fevers, chills, nausea, vomiting. No calf pain, chest pain, shortness of breath.   Objective: General: No acute distress, AAOx3 -presents today wearing a regular shoe. DP/PT pulses palpable 2/4, CRT < 3 sec to all digits.  Protective sensation intact. Motor function intact.  RIGHT foot: Wound on the plantar aspect of the foot today is smaller measuring 2.8 x 0.8 x 1.3 cm.  There is only one area that probes approximately 1.3 cm and the other areas are more superficial.  There is a fibrotic, granular wound base prior to debridement as well.  There is no surrounding erythema, ascending cellulitis.  There is no fluctuation or crepitation. No other open lesions or pre-ulcerative lesions.  No pain with calf compression, swelling, warmth, erythema.   Assessment and Plan:  Chronic ulcer right foot  -Treatment options discussed including all alternatives, risks, and complications -I sharply debrided the wound without any complications down to healthy, bleeding tissue utilizing tissue nipper as well as with a #312 blade scalpel.  I debrided the wound down to healthy, granular tissue.  Continue Santyl dressing changes daily and recommend offloading at all times.  I want to wear the surgical shoe with offloading.  Please been wearing a regular shoe.  Also this point of the wound is been ongoing for quite some time of the referral and  for the wound care center and he is in agreement. -Monitor for any clinical signs or symptoms of infection and directed to call the office immediately should any occur or go to the ER.  Return in about 1 week   Trula Slade DPM

## 2018-03-13 DIAGNOSIS — M25511 Pain in right shoulder: Secondary | ICD-10-CM | POA: Diagnosis not present

## 2018-03-15 ENCOUNTER — Ambulatory Visit (HOSPITAL_COMMUNITY)
Admission: RE | Admit: 2018-03-15 | Discharge: 2018-03-15 | Disposition: A | Discharge status: Home / Self Care | Payer: Medicare HMO | Source: Ambulatory Visit | Attending: Internal Medicine | Admitting: Internal Medicine

## 2018-03-15 ENCOUNTER — Ambulatory Visit (HOSPITAL_COMMUNITY): Payer: Medicare HMO | Admitting: Anesthesiology

## 2018-03-15 ENCOUNTER — Telehealth: Payer: Self-pay | Admitting: Cardiology

## 2018-03-15 ENCOUNTER — Encounter (HOSPITAL_COMMUNITY): Payer: Self-pay | Admitting: *Deleted

## 2018-03-15 ENCOUNTER — Encounter (HOSPITAL_COMMUNITY)
Admission: RE | Disposition: A | Discharge status: Home / Self Care | Payer: Self-pay | Source: Ambulatory Visit | Attending: Internal Medicine

## 2018-03-15 DIAGNOSIS — K219 Gastro-esophageal reflux disease without esophagitis: Secondary | ICD-10-CM | POA: Diagnosis not present

## 2018-03-15 DIAGNOSIS — I4891 Unspecified atrial fibrillation: Secondary | ICD-10-CM | POA: Diagnosis not present

## 2018-03-15 DIAGNOSIS — F17210 Nicotine dependence, cigarettes, uncomplicated: Secondary | ICD-10-CM | POA: Diagnosis not present

## 2018-03-15 DIAGNOSIS — I272 Pulmonary hypertension, unspecified: Secondary | ICD-10-CM | POA: Diagnosis not present

## 2018-03-15 DIAGNOSIS — I4819 Other persistent atrial fibrillation: Secondary | ICD-10-CM | POA: Diagnosis not present

## 2018-03-15 DIAGNOSIS — M069 Rheumatoid arthritis, unspecified: Secondary | ICD-10-CM | POA: Diagnosis not present

## 2018-03-15 DIAGNOSIS — R69 Illness, unspecified: Secondary | ICD-10-CM | POA: Diagnosis not present

## 2018-03-15 DIAGNOSIS — I10 Essential (primary) hypertension: Secondary | ICD-10-CM | POA: Insufficient documentation

## 2018-03-15 HISTORY — PX: CARDIOVERSION: SHX1299

## 2018-03-15 SURGERY — CARDIOVERSION
Anesthesia: General

## 2018-03-15 MED ORDER — PROPOFOL 10 MG/ML IV BOLUS
INTRAVENOUS | Status: DC | PRN
  Administered 2018-03-15: 30 mg via INTRAVENOUS
  Administered 2018-03-15: 50 mg via INTRAVENOUS

## 2018-03-15 MED ORDER — SODIUM CHLORIDE 0.9 % IV SOLN: INTRAVENOUS | Status: DC

## 2018-03-15 MED ORDER — SODIUM CHLORIDE 0.9 % IV SOLN
INTRAVENOUS | Status: DC | PRN
  Administered 2018-03-15: 09:00:00 via INTRAVENOUS

## 2018-03-15 NOTE — H&P (Signed)
   INTERVAL PROCEDURE H&P  History and Physical Interval Note:  03/15/2018 8:15 AM  Samuel Moran has presented today for their planned procedure. The various methods of treatment have been discussed with the patient and family. After consideration of risks, benefits and other options for treatment, the patient has consented to the procedure.  The patients' outpatient history has been reviewed, patient examined, and no change in status from most recent office note within the past 30 days. I have reviewed the patients' chart and labs and will proceed as planned. Questions were answered to the patient's satisfaction.   [name], MD, [facility], [place] Director of the Advanced Lipid Disorders &  Cardiovascular Risk Reduction Clinic Diplomate of the American Board of Clinical Lipidology Attending Cardiologist  Direct Dial: [phone]  Fax: [phone]  Website:  [email or web address] 03/15/2018, 8:15 AM

## 2018-03-15 NOTE — Transfer of Care (Signed)
Immediate Anesthesia Transfer of Care Note  Patient: Keath Matera  Procedure(s) Performed: CARDIOVERSION (N/A )  Patient Location: Endoscopy Unit  Anesthesia Type:General  Level of Consciousness: awake, drowsy and patient cooperative  Airway & Oxygen Therapy: Patient Spontanous Breathing and Patient connected to nasal cannula oxygen  Post-op Assessment: Report given to RN and Post -op Vital signs reviewed and stable  Post vital signs: Reviewed and stable  Last Vitals:  Vitals Value Taken Time  BP 183/112 03/15/2018  9:16 AM  Temp    Pulse 76 03/15/2018  9:19 AM  Resp 28 03/15/2018  9:19 AM  SpO2 97 % 03/15/2018  9:19 AM    Last Pain:  Vitals:   03/15/18 0747  TempSrc: Oral  PainSc: 0-No pain         Complications: No apparent anesthesia complications

## 2018-03-15 NOTE — Telephone Encounter (Signed)
Patient just left [facility] and had two cardioversions done but neither worked. Please call regarding adjusting medications.

## 2018-03-15 NOTE — Anesthesia Postprocedure Evaluation (Signed)
Anesthesia Post Note  Patient: Samuel Moran  Procedure(s) Performed: CARDIOVERSION (N/A )     Patient location during evaluation: PACU Anesthesia Type: General Level of consciousness: awake and alert Pain management: pain level controlled Vital Signs Assessment: post-procedure vital signs reviewed and stable Respiratory status: spontaneous breathing, nonlabored ventilation, respiratory function stable and patient connected to nasal cannula oxygen Cardiovascular status: blood pressure returned to baseline and stable Postop Assessment: no apparent nausea or vomiting Anesthetic complications: no    Last Vitals:  Vitals:   03/15/18 0930 03/15/18 0940  BP: (!) 152/97 (!) 155/85  Pulse: 78 73  Resp: 15 15  Temp:    SpO2: 99% 97%    Last Pain:  Vitals:   03/15/18 0940  TempSrc:   PainSc: 0-No pain                 Riannah Stagner L Drema Eddington

## 2018-03-15 NOTE — CV Procedure (Signed)
   CARDIOVERSION NOTE  Procedure: Electrical Cardioversion Indications:  Atrial Fibrillation  Procedure Details:  Consent: Risks of procedure as well as the alternatives and risks of each were explained to the (patient/caregiver).  Consent for procedure obtained.  Time Out: Verified patient identification, verified procedure, site/side was marked, verified correct patient position, special equipment/implants available, medications/allergies/relevent history reviewed, required imaging and test results available.  Performed  Patient placed on cardiac monitor, pulse oximetry, supplemental oxygen as necessary.  Sedation given: Propofol per anesethesia Pacer pads placed anterior and posterior chest.  Cardioverted 2 time(s).  Cardioverted at 150J and 200J biphasic.  Impression: Findings: Post procedure EKG shows: afib with PVC's Complications: None Patient did tolerate procedure well.  Plan: 1. Ultimately unsuccessful DCCV. The patient converted to sinus twice with cardioversion, but maintained sinus rhythm with 1st degree AVB for about 1 and 3-5 minutes, respectively, then the rhythm degraded back to afib. Will obtain 12 lead EKG post-procedure to confirm. 2. Recommend continued Eliquis compliance. 3. Will likely need AAD therapy (noted to have moderate LAE and mild RAE, which increase likelihood of afib recurrence) as per Dr. Agustin Cree   Time Spent Directly with the Patient:  45 minutes   [name], MD, [facility] [place] Director of the Advanced Lipid Disorders &  Cardiovascular Risk Reduction Clinic Diplomate of the American Board of Clinical Lipidology Attending Cardiologist  Direct Dial: [phone]  Fax: [phone]  Website:  [email or web address] 03/15/2018, 9:24 AM

## 2018-03-15 NOTE — Anesthesia Procedure Notes (Signed)
Procedure Name: General with mask airway Date/Time: 03/15/2018 9:08 AM Performed by: Renato Shin, CRNA Pre-anesthesia Checklist: Patient identified, Emergency Drugs available, Suction available and Patient being monitored Patient Re-evaluated:Patient Re-evaluated prior to induction Oxygen Delivery Method: Ambu bag Preoxygenation: Pre-oxygenation with 100% oxygen Induction Type: IV induction Ventilation: Mask ventilation without difficulty Placement Confirmation: positive ETCO2,  CO2 detector and breath sounds checked- equal and bilateral Dental Injury: Teeth and Oropharynx as per pre-operative assessment

## 2018-03-15 NOTE — Telephone Encounter (Signed)
Left message for patient to return call.

## 2018-03-15 NOTE — Anesthesia Preprocedure Evaluation (Addendum)
Anesthesia Evaluation  Patient identified by MRN, date of birth, ID band Patient awake    Reviewed: Allergy & Precautions, NPO status , Patient's Chart, lab work & pertinent test results  Airway Mallampati: III  TM Distance: >3 FB Neck ROM: Full  Mouth opening: Limited Mouth Opening  Dental no notable dental hx. (+) Teeth Intact, Dental Advisory Given   Pulmonary neg pulmonary ROS, Current Smoker,    Pulmonary exam normal breath sounds clear to auscultation       Cardiovascular hypertension, + dysrhythmias Atrial Fibrillation  Rhythm:Irregular Rate:Normal  TTE 07/2017 - The patient was in atrial fibrillation. Normal LV size with   moderate LV hypertrophy. EF 55-60%. Normal RV size and systolic function. No significant valvular abnormalities. Mild pulmonary hypertension.   Neuro/Psych Seizures -, Well Controlled,  Parkinson's negative psych ROS   GI/Hepatic Neg liver ROS, GERD  ,  Endo/Other  negative endocrine ROS  Renal/GU negative Renal ROS  negative genitourinary   Musculoskeletal  (+) Arthritis , Rheumatoid disorders,    Abdominal   Peds  Hematology negative hematology ROS (+)   Anesthesia Other Findings   Reproductive/Obstetrics                           Anesthesia Physical Anesthesia Plan  ASA: III  Anesthesia Plan: General   Post-op Pain Management:    Induction: Intravenous  PONV Risk Score and Plan: 1 and Treatment may vary due to age or medical condition and Propofol infusion  Airway Management Planned: Natural Airway and Simple Face Mask  Additional Equipment:   Intra-op Plan:   Post-operative Plan:   Informed Consent: I have reviewed the patients History and Physical, chart, labs and discussed the procedure including the risks, benefits and alternatives for the proposed anesthesia with the patient or authorized representative who has indicated his/her understanding and  acceptance.   Dental advisory given  Plan Discussed with: CRNA  Anesthesia Plan Comments:         Anesthesia Quick Evaluation

## 2018-03-15 NOTE — Discharge Instructions (Signed)
Electrical Cardioversion, Care After °This sheet gives you information about how to care for yourself after your procedure. Your health care provider may also give you more specific instructions. If you have problems or questions, contact your health care provider. °What can I expect after the procedure? °After the procedure, it is common to have: °· Some redness on the skin where the shocks were given. ° °Follow these instructions at home: °· Do not drive for 24 hours if you were given a medicine to help you relax (sedative). °· Take over-the-counter and prescription medicines only as told by your health care provider. °· Ask your health care provider how to check your pulse. Check it often. °· Rest for 48 hours after the procedure or as told by your health care provider. °· Avoid or limit your caffeine use as told by your health care provider. °Contact a health care provider if: °· You feel like your heart is beating too quickly or your pulse is not regular. °· You have a serious muscle cramp that does not go away. °Get help right away if: °· You have discomfort in your chest. °· You are dizzy or you feel faint. °· You have trouble breathing or you are short of breath. °· Your speech is slurred. °· You have trouble moving an arm or leg on one side of your body. °· Your fingers or toes turn cold or blue. °This information is not intended to replace advice given to you by your health care provider. Make sure you discuss any questions you have with your health care provider. °Document Released: 02/09/2013 Document Revised: 11/23/2015 Document Reviewed: 10/26/2015 °Elsevier Interactive Patient Education © 2018 Elsevier Inc. ° °

## 2018-03-16 DIAGNOSIS — M75120 Complete rotator cuff tear or rupture of unspecified shoulder, not specified as traumatic: Secondary | ICD-10-CM | POA: Insufficient documentation

## 2018-03-17 DIAGNOSIS — M75121 Complete rotator cuff tear or rupture of right shoulder, not specified as traumatic: Secondary | ICD-10-CM | POA: Diagnosis not present

## 2018-03-17 NOTE — Telephone Encounter (Signed)
Patient called back and informed me that they were cardioverted twice. The first time they almost instantly went back into atrial fibrillation and was cardioverted a second time when after a few minutes he went back into atrial fibrillation. Patient wants direction on management via medication for this. Will consult wit Dr. Agustin Cree.

## 2018-03-17 NOTE — Telephone Encounter (Signed)
Left second message for patient to return call.

## 2018-03-17 NOTE — Telephone Encounter (Signed)
Per Dr. Agustin Cree medication changes will be made at next office visit. Patient informed and verbally understands

## 2018-03-18 ENCOUNTER — Other Ambulatory Visit: Payer: Self-pay | Admitting: Family Medicine

## 2018-03-19 ENCOUNTER — Ambulatory Visit: Payer: Medicare HMO | Admitting: Podiatry

## 2018-03-19 DIAGNOSIS — M545 Low back pain: Secondary | ICD-10-CM | POA: Diagnosis not present

## 2018-03-22 ENCOUNTER — Encounter: Payer: Self-pay | Admitting: Podiatry

## 2018-03-22 ENCOUNTER — Ambulatory Visit (INDEPENDENT_AMBULATORY_CARE_PROVIDER_SITE_OTHER): Payer: Medicare HMO | Admitting: Podiatry

## 2018-03-22 VITALS — BP 163/83 | HR 70 | Temp 98.0°F | Resp 16

## 2018-03-22 DIAGNOSIS — L97512 Non-pressure chronic ulcer of other part of right foot with fat layer exposed: Secondary | ICD-10-CM

## 2018-03-23 ENCOUNTER — Encounter (HOSPITAL_BASED_OUTPATIENT_CLINIC_OR_DEPARTMENT_OTHER): Payer: Self-pay

## 2018-03-23 ENCOUNTER — Encounter (HOSPITAL_BASED_OUTPATIENT_CLINIC_OR_DEPARTMENT_OTHER): Payer: Medicare HMO | Attending: Internal Medicine

## 2018-03-26 ENCOUNTER — Telehealth: Payer: Self-pay | Admitting: *Deleted

## 2018-03-26 DIAGNOSIS — M545 Low back pain: Secondary | ICD-10-CM | POA: Diagnosis not present

## 2018-03-26 NOTE — Progress Notes (Signed)
Subjective: Damarea Merkel is a 72 y.o. is seen today in office s/p right foot wound excision, closure preformed on 02/03/2018.  He is continue with Santyl dressing changes.  Overall he states he is doing well he has not noticed any increase in swelling or drainage he denies any pus coming from the area.  No redness.  Denies any systemic complaints such as fevers, chills, nausea, vomiting. No calf pain, chest pain, shortness of breath.   Objective: General: No acute distress, AAOx3 -presents today wearing a regular shoe. DP/PT pulses palpable 2/4, CRT < 3 sec to all digits.  Protective sensation intact. Motor function intact.  RIGHT foot: Wound on the plantar aspect of the foot today is smaller measuring 2.6 x 0.8 x 1.3 cm.The area of 1.3cm is the deepest proximally but distally it is ore superficial.There is a fibrotic, granular wound base prior to debridement as well.  There is no surrounding erythema, ascending cellulitis.  There is no fluctuation or crepitation. No other open lesions or pre-ulcerative lesions.  No pain with calf compression, swelling, warmth, erythema.   Assessment and Plan:  Chronic ulcer right foot  -Treatment options discussed including all alternatives, risks, and complications -I sharply debrided the wound without any complications down to healthy, bleeding tissue utilizing tissue nipper as well as with a #312 blade scalpel.  I debrided the wound down to healthy, granular tissue.  Continue Santyl dressing changes daily and recommend offloading at all times.  I want to wear the surgical shoe with offloading.  Please been wearing a regular shoe.   -Awaiting wound care referral.  -Monitor for any clinical signs or symptoms of infection and directed to call the office immediately should any occur or go to the ER.  Return in about 1 week   Trula Slade DPM

## 2018-03-26 NOTE — Telephone Encounter (Signed)
-----   Message from Trula Slade, DPM sent at 03/26/2018  7:36 AM EST ----- Can you please put in a wound care referral? Not sure if this was done last week. Thanks.

## 2018-03-26 NOTE — Telephone Encounter (Signed)
I spoke with Hubbard, she states pt was a NO SHOW, NO CALL for his 03/23/2018 appt.

## 2018-03-27 DIAGNOSIS — G4733 Obstructive sleep apnea (adult) (pediatric): Secondary | ICD-10-CM | POA: Diagnosis not present

## 2018-03-29 ENCOUNTER — Ambulatory Visit: Payer: Medicare HMO | Admitting: Podiatry

## 2018-03-30 DIAGNOSIS — G4733 Obstructive sleep apnea (adult) (pediatric): Secondary | ICD-10-CM | POA: Diagnosis not present

## 2018-03-31 ENCOUNTER — Ambulatory Visit (INDEPENDENT_AMBULATORY_CARE_PROVIDER_SITE_OTHER): Payer: Medicare HMO | Admitting: Cardiology

## 2018-03-31 ENCOUNTER — Encounter: Payer: Self-pay | Admitting: Cardiology

## 2018-03-31 VITALS — BP 120/62 | HR 84 | Ht 71.0 in | Wt 212.1 lb

## 2018-03-31 DIAGNOSIS — I4819 Other persistent atrial fibrillation: Secondary | ICD-10-CM

## 2018-03-31 DIAGNOSIS — I10 Essential (primary) hypertension: Secondary | ICD-10-CM | POA: Diagnosis not present

## 2018-03-31 DIAGNOSIS — G20 Parkinson's disease: Secondary | ICD-10-CM | POA: Diagnosis not present

## 2018-03-31 DIAGNOSIS — M545 Low back pain: Secondary | ICD-10-CM | POA: Diagnosis not present

## 2018-03-31 NOTE — Patient Instructions (Signed)
Medication Instructions:  Your physician recommends that you continue on your current medications as directed. Please refer to the Current Medication list given to you today.  If you need a refill on your cardiac medications before your next appointment, please call your pharmacy.   Lab work: None.  If you have labs (blood work) drawn today and your tests are completely normal, you will receive your results only by: Marland Kitchen MyChart Message (if you have MyChart) OR . A paper copy in the mail If you have any lab test that is abnormal or we need to change your treatment, we will call you to review the results.  Testing/Procedures: Your physician has requested that you have a lexiscan myoview. For further information please visit [email or web address]. Please follow instruction sheet, as given.    Follow-Up: At [facility], you and your health needs are our priority.  As part of our continuing mission to provide you with exceptional heart care, we have created designated Provider Care Teams.  These Care Teams include your primary Cardiologist (physician) and Advanced Practice Providers (APPs -  Physician Assistants and Nurse Practitioners) who all work together to provide you with the care you need, when you need it. You will need a follow up appointment in 2 months.  Please call our office 2 months in advance to schedule this appointment.  You may see No primary care provider on file. or another member of our [employer] Provider Team in [place]: Shirlee More, MD . Jyl Heinz, MD  Any Other Special Instructions Will Be Listed Below (If Applicable).  Cardiac Nuclear Scan A cardiac nuclear scan is a test that measures blood flow to the heart when a person is resting and when he or she is exercising. The test looks for problems such as:  Not enough blood reaching a portion of the heart.  The heart muscle not working normally.  You may need this test if:  You have heart  disease.  You have had abnormal lab results.  You have had heart surgery or angioplasty.  You have chest pain.  You have shortness of breath.  In this test, a radioactive dye (tracer) is injected into your bloodstream. After the tracer has traveled to your heart, an imaging device is used to measure how much of the tracer is absorbed by or distributed to various areas of your heart. This procedure is usually done at a hospital and takes 2-4 hours. Tell a health care provider about:  Any allergies you have.  All medicines you are taking, including vitamins, herbs, eye drops, creams, and over-the-counter medicines.  Any problems you or family members have had with the use of anesthetic medicines.  Any blood disorders you have.  Any surgeries you have had.  Any medical conditions you have.  Whether you are pregnant or may be pregnant. What are the risks? Generally, this is a safe procedure. However, problems may occur, including:  Serious chest pain and heart attack. This is only a risk if the stress portion of the test is done.  Rapid heartbeat.  Sensation of warmth in your chest. This usually passes quickly.  What happens before the procedure?  Ask your health care provider about changing or stopping your regular medicines. This is especially important if you are taking diabetes medicines or blood thinners.  Remove your jewelry on the day of the procedure. What happens during the procedure?  An IV tube will be inserted into one of your veins.  Your health care  provider will inject a small amount of radioactive tracer through the tube.  You will wait for 20-40 minutes while the tracer travels through your bloodstream.  Your heart activity will be monitored with an electrocardiogram (ECG).  You will lie down on an exam table.  Images of your heart will be taken for about 15-20 minutes.  You may be asked to exercise on a treadmill or stationary bike. While you  exercise, your heart's activity will be monitored with an ECG, and your blood pressure will be checked. If you are unable to exercise, you may be given a medicine to increase blood flow to parts of your heart.  When blood flow to your heart has peaked, a tracer will again be injected through the IV tube.  After 20-40 minutes, you will get back on the exam table and have more images taken of your heart.  When the procedure is over, your IV tube will be removed. The procedure may vary among health care providers and hospitals. Depending on the type of tracer used, scans may need to be repeated 3-4 hours later. What happens after the procedure?  Unless your health care provider tells you otherwise, you may return to your normal schedule, including diet, activities, and medicines.  Unless your health care provider tells you otherwise, you may increase your fluid intake. This will help flush the contrast dye from your body. Drink enough fluid to keep your urine clear or pale yellow.  It is up to you to get your test results. Ask your health care provider, or the department that is doing the test, when your results will be ready. Summary  A cardiac nuclear scan measures the blood flow to the heart when a person is resting and when he or she is exercising.  You may need this test if you are at risk for heart disease.  Tell your health care provider if you are pregnant.  Unless your health care provider tells you otherwise, increase your fluid intake. This will help flush the contrast dye from your body. Drink enough fluid to keep your urine clear or pale yellow. This information is not intended to replace advice given to you by your health care provider. Make sure you discuss any questions you have with your health care provider. Document Released: 05/16/2004 Document Revised: 04/23/2016 Document Reviewed: 03/30/2013 Elsevier Interactive Patient Education  2017 [employer].

## 2018-03-31 NOTE — Progress Notes (Signed)
Cardiology Office Note:    Date:  03/31/2018   ID:  Samuel Moran, DOB 06-09-1945, MRN [id number]  PCP:  Samuel Lor, MD  Cardiologist:  Samuel Campus, MD    Referring MD: Samuel Lor, MD   Chief Complaint  Patient presents with  . 1 month follow up  Doing fair  History of Present Illness:    Samuel Moran is a 72 y.o. male with persistent atrial fibrillation.  He is being anticoagulated successfully then I brought him to the hospital for cardioversion after first shock he flipped very quickly to atrial fibrillation second shock 2 minutes and then flew back to atrial fibrillation clearly it did not work.  We talked about options in this situation option being rhythm control or rate control.  He is willing to try rhythm control therefore I will ask him to have stress test to make sure there is no significant coronary artery disease and then will initiate an antiarrhythmic therapy may benefit from flecainide if stress test will be negative.  Overall he complained of being weak tired and exhausted of course is difficult to tell if this is because of Parkinson's disease all because of atrial fibrillation.  Past Medical History:  Diagnosis Date  . Atrial fibrillation ([place])    onset 07/17/17  . Dysrhythmia   . ED (erectile dysfunction)   . GERD 07/01/2007  . Parkinson's disease ([place])   . 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 PROSTATE SPECIFIC ANTIGEN, ELEVATED 06/29/2007  . Rheumatoid arthritis ([place])   . SEIZURE DISORDER 07/01/2007   one epsiode only  . Spinal stenosis of lumbar region 09/22/2014    Past Surgical History:  Procedure Laterality Date  . AMPUTATION     right great toe - tramatic age 82  . CARDIOVERSION N/A 03/15/2018   Procedure: CARDIOVERSION;  Surgeon: [name], MD;  Location: [facility] ENDOSCOPY;  Service: Cardiovascular;  Laterality: N/A;  . COLONOSCOPY    . DEBRIDEMENT TOE Right 07/2016  . HAND SURGERY  03/2017   removal cysts  . IR  FLUORO GUIDE CV LINE RIGHT  10/16/2016  . IR US GUIDE VASC ACCESS RIGHT  10/16/2016  . OSTECTOMY Right 08/05/2017   Procedure: OSTECTOMY COMPLETE METARSAL HEAD FIRST RIGHT;  Surgeon: Trula Slade, DPM;  Location: [facility];  Service: Podiatry;  Laterality: Right;    Current Medications: Current Meds  Medication Sig  . apixaban (ELIQUIS) 5 MG TABS tablet Take 5 mg by mouth 2 (two) times daily.   . carbidopa-levodopa (SINEMET IR) 25-100 MG tablet TAKE 1 TABLET BY MOUTH THREE TIMES A DAY (Patient taking differently: Take 1 tablet by mouth 3 (three) times daily. )  . collagenase (SANTYL) ointment Apply 1 application topically daily. (Patient taking differently: Apply 1 application topically daily. Applied to foot with dressing change)  . ibuprofen (ADVIL,MOTRIN) 200 MG tablet Take 800 mg by mouth every 8 (eight) hours as needed (for pain/headaches.).  Marland Kitchen metoprolol succinate (TOPROL-XL) 50 MG 24 hr tablet TAKE 1 TABLET (50 MG TOTAL) BY MOUTH DAILY. TAKE WITH OR IMMEDIATELY FOLLOWING A MEAL.  . Multiple Vitamin (MULTIVITAMIN WITH MINERALS) TABS tablet Take 1 tablet by mouth daily. Centrum Silver  . NEUPRO 4 MG/24HR PLACE 1 PATCH ONTO THE SKIN DAILY.  Marland Kitchen Omega-3 Fatty Acids (FISH OIL) 1200 MG CPDR Take 1,200 mg by mouth daily.  Marland Kitchen omeprazole (PRILOSEC) 20 MG capsule Take 20 mg by mouth daily.  . pneumococcal 13-valent conjugate vaccine (PREVNAR 13) SUSP injection Prevnar 13 (PF)  0.5 mL intramuscular syringe     Allergies:   Patient has no known allergies.   Social History   Socioeconomic History  . Marital status: Single    Spouse name: Not on file  . Number of children: Not on file  . Years of education: Not on file  . Highest education level: Not on file  Occupational History  . Occupation: Chartered certified accountant  Social Needs  . Financial resource strain: Not on file  . Food insecurity:    Worry: Not on file    Inability: Not on file  . Transportation needs:    Medical: Not on file     Non-medical: Not on file  Tobacco Use  . Smoking status: Light Tobacco Smoker    Types: Cigars  . Smokeless tobacco: Never Used  . Tobacco comment: 1 cigar, takes 3 weeks  Substance and Sexual Activity  . Alcohol use: Yes    Alcohol/week: 4.0 standard drinks    Types: 4 Standard drinks or equivalent per week    Comment: one drink a day (liquor)  . Drug use: No  . Sexual activity: Not on file  Lifestyle  . Physical activity:    Days per week: Not on file    Minutes per session: Not on file  . Stress: Not on file  Relationships  . Social connections:    Talks on phone: Not on file    Gets together: Not on file    Attends religious service: Not on file    Active member of club or organization: Not on file    Attends meetings of clubs or organizations: Not on file    Relationship status: Not on file  Other Topics Concern  . Not on file  Social History Narrative  . Not on file     Family History: The patient's family history includes Alcoholism in his mother; Arthritis in his father and mother. There is no history of Colon cancer, Esophageal cancer, Rectal cancer, or Stomach cancer. ROS:   Please see the history of present illness.    All 14 point review of systems negative except as described per history of present illness  EKGs/Labs/Other Studies Reviewed:      Recent Labs: 08/05/2017: Magnesium 1.9 03/08/2018: BUN 21; Creatinine, Ser 0.87; Hemoglobin 13.6; Platelets 256; Potassium 4.2; Sodium 138  Recent Lipid Panel    Component Value Date/Time   CHOL 168 02/26/2017 0922   TRIG 55.0 02/26/2017 0922   HDL 52.90 02/26/2017 0922   CHOLHDL 3 02/26/2017 0922   VLDL 11.0 02/26/2017 0922   LDLCALC 104 (H) 02/26/2017 0922    Physical Exam:    VS:  BP 120/62   Pulse 84   Ht 5\' 11"  (1.803 m)   Wt 212 lb 1.9 oz (96.2 kg)   SpO2 95%   BMI 29.58 kg/m     Wt Readings from Last 3 Encounters:  03/31/18 212 lb 1.9 oz (96.2 kg)  03/15/18 210 lb (95.3 kg)  03/08/18 214 lb  12.8 oz (97.4 kg)     GEN:  Well nourished, well developed in no acute distress HEENT: Normal NECK: No JVD; No carotid bruits LYMPHATICS: No lymphadenopathy CARDIAC: Irregularly irregular, no murmurs, no rubs, no gallops RESPIRATORY:  Clear to auscultation without rales, wheezing or rhonchi  ABDOMEN: Soft, non-tender, non-distended MUSCULOSKELETAL:  No edema; No deformity  SKIN: Warm and dry LOWER EXTREMITIES: no swelling NEUROLOGIC:  Alert and oriented x 3 PSYCHIATRIC:  Normal affect   ASSESSMENT:  1. Essential hypertension   2. Persistent atrial fibrillation   3. Parkinson disease ([place])    PLAN:    In order of problems listed above:  1. Persistent atrial fibrillation.  I will do a stress test to assess risk of potentially taking antiarrhythmic we will continue anticoagulation. 2. Essential hypertension blood pressure well controlled continue present management. 3. Parkinson disease.  Frustrated with this but follow-up very carefully by neurology.   Medication Adjustments/Labs and Tests Ordered: Current medicines are reviewed at length with the patient today.  Concerns regarding medicines are outlined above.  No orders of the defined types were placed in this encounter.  Medication changes: No orders of the defined types were placed in this encounter.   Signed, Park Liter, MD, [facility] 03/31/2018 11:13 AM    [place]

## 2018-04-05 DIAGNOSIS — M545 Low back pain: Secondary | ICD-10-CM | POA: Diagnosis not present

## 2018-04-06 ENCOUNTER — Ambulatory Visit (INDEPENDENT_AMBULATORY_CARE_PROVIDER_SITE_OTHER): Payer: Medicare HMO | Admitting: Podiatry

## 2018-04-06 ENCOUNTER — Telehealth (HOSPITAL_COMMUNITY): Payer: Self-pay | Admitting: *Deleted

## 2018-04-06 ENCOUNTER — Encounter: Payer: Self-pay | Admitting: Podiatry

## 2018-04-06 DIAGNOSIS — L97512 Non-pressure chronic ulcer of other part of right foot with fat layer exposed: Secondary | ICD-10-CM | POA: Diagnosis not present

## 2018-04-06 MED ORDER — DICLOFENAC SODIUM 1 % TD GEL: 2.0000 g | Freq: Four times a day (QID) | TRANSDERMAL | 2 refills | Status: DC

## 2018-04-06 NOTE — Telephone Encounter (Signed)
Patient given detailed instructions per Myocardial Perfusion Study Information Sheet for the test on 04/12/18 at 0745. Patient notified to arrive 15 minutes early and that it is imperative to arrive on time for appointment to keep from having the test rescheduled.  If you need to cancel or reschedule your appointment, please call the office within 24 hours of your appointment. . Patient verbalized understanding.Samuel Moran, [name]

## 2018-04-09 ENCOUNTER — Encounter

## 2018-04-09 ENCOUNTER — Ambulatory Visit: Payer: Medicare HMO | Admitting: Neurology

## 2018-04-09 NOTE — Telephone Encounter (Signed)
Is it okay to refill? Please advise.

## 2018-04-09 NOTE — Telephone Encounter (Signed)
Call in #90 but he needs to establish with a new PCP soon (not me)

## 2018-04-12 ENCOUNTER — Ambulatory Visit (HOSPITAL_COMMUNITY): Payer: Medicare HMO | Attending: Internal Medicine

## 2018-04-12 VITALS — Ht 71.0 in | Wt 212.0 lb

## 2018-04-12 DIAGNOSIS — I480 Paroxysmal atrial fibrillation: Secondary | ICD-10-CM

## 2018-04-12 DIAGNOSIS — I4819 Other persistent atrial fibrillation: Secondary | ICD-10-CM | POA: Insufficient documentation

## 2018-04-12 LAB — MYOCARDIAL PERFUSION IMAGING
CHL CUP NUCLEAR SDS: 0
CHL CUP RESTING HR STRESS: 88 {beats}/min
CSEPPHR: 100 {beats}/min
LV dias vol: 99 mL (ref 62–150)
LV sys vol: 41 mL
NUC STRESS TID: 1.05
SRS: 0
SSS: 0

## 2018-04-12 MED ORDER — REGADENOSON 0.4 MG/5ML IV SOLN
0.4000 mg | Freq: Once | INTRAVENOUS | Status: AC
  Administered 2018-04-12: 0.4 mg via INTRAVENOUS

## 2018-04-12 MED ORDER — TECHNETIUM TC 99M TETROFOSMIN IV KIT
32.1000 | PACK | Freq: Once | INTRAVENOUS | Status: AC | PRN
  Administered 2018-04-12: 32.1 via INTRAVENOUS
  Filled 2018-04-12: qty 33

## 2018-04-12 MED ORDER — TECHNETIUM TC 99M TETROFOSMIN IV KIT
10.2000 | PACK | Freq: Once | INTRAVENOUS | Status: AC | PRN
  Administered 2018-04-12: 10.2 via INTRAVENOUS
  Filled 2018-04-12: qty 11

## 2018-04-12 NOTE — Progress Notes (Signed)
Subjective: Samuel Moran is a 72 y.o. is seen today in office s/p right foot wound excision, closure preformed on 02/03/2018.  He presents today for follow-up evaluation of the wound.  He has been continue with Santyl dressing changes.  Denies any drainage or pus or increase in swelling or redness and he thinks the wound is been doing better.  He has no other concerns today. Denies any systemic complaints such as fevers, chills, nausea, vomiting. No calf pain, chest pain, shortness of breath.   Objective: General: No acute distress, AAOx3 -presents today wearing a regular shoe. DP/PT pulses palpable 2/4, CRT < 3 sec to all digits.  RIGHT foot: Wound on the plantar aspect of the foot today is smaller measuring 2 x 0.6 x 1 cm.The area of 1cm is the deepest proximally but distally it is ore superficial.There is a fibrotic, granular wound base prior to debridement as well.  There is no surrounding erythema, ascending cellulitis.  There is no fluctuation or crepitation.  Overall the wound is improved today. No other open lesions or pre-ulcerative lesions.  No pain with calf compression, swelling, warmth, erythema.   Assessment and Plan:  Chronic ulcer right foot, with improvement  -Treatment options discussed including all alternatives, risks, and complications -I sharply debrided the wound without any complications down to healthy, bleeding tissue utilizing tissue nipper as well as with a #312 blade scalpel.  I debrided the wound down to healthy, granular tissue.  Continue Santyl dressing changes daily and recommend offloading at all times.  I want to wear the surgical shoe with offloading.  Please been wearing a regular shoe.   -Declined wound care consult. -Monitor for any clinical signs or symptoms of infection and directed to call the office immediately should any occur or go to the ER.  Return in about 1 week   Trula Slade DPM

## 2018-04-13 ENCOUNTER — Telehealth: Payer: Self-pay | Admitting: Emergency Medicine

## 2018-04-13 NOTE — Telephone Encounter (Signed)
Patient informed of Dr. Wendy Poet recommendation. He verbally understands.

## 2018-04-13 NOTE — Telephone Encounter (Signed)
Called patient to inform of stress test results. During phone call he reports he has a lack of energy everyday for months and wants to know if anything can be done before his next follow up with Dr. Agustin Cree in January. Will route to Dr. Agustin Cree.

## 2018-04-13 NOTE — Telephone Encounter (Signed)
Will talk about it during visit. He may feel this way because of A fibr,  Will consider starting antiarrhythmic

## 2018-04-16 ENCOUNTER — Encounter: Payer: Medicare HMO | Admitting: Podiatry

## 2018-04-16 ENCOUNTER — Ambulatory Visit (INDEPENDENT_AMBULATORY_CARE_PROVIDER_SITE_OTHER): Payer: Medicare HMO | Admitting: Podiatry

## 2018-04-16 ENCOUNTER — Encounter: Payer: Self-pay | Admitting: Podiatry

## 2018-04-16 VITALS — Temp 98.4°F

## 2018-04-16 DIAGNOSIS — L97512 Non-pressure chronic ulcer of other part of right foot with fat layer exposed: Secondary | ICD-10-CM

## 2018-04-19 ENCOUNTER — Telehealth: Payer: Self-pay | Admitting: Podiatry

## 2018-04-19 MED ORDER — NONFORMULARY OR COMPOUNDED ITEM: 5 refills | Status: DC

## 2018-04-19 NOTE — Telephone Encounter (Signed)
Pt stated that Dr. Jacqualyn Posey called in a prescription and that if the patient had any problem with the insurance and [facility] that he should call the office.

## 2018-04-19 NOTE — Telephone Encounter (Signed)
Faxed orders to  [facility]. 

## 2018-04-19 NOTE — Telephone Encounter (Signed)
Dr. Jacqualyn Posey ordered Left message informing pt Dr. Jacqualyn Posey ordered an antiinflammatory cream from [facility] [phone] and they would call him with coverage and delivery information.

## 2018-04-19 NOTE — Addendum Note (Signed)
Addended by: Harriett Sine D on: 04/19/2018 11:50 AM   Modules accepted: Orders

## 2018-04-21 NOTE — Progress Notes (Signed)
Subjective: Samuel Moran is a 72 y.o. is seen today in office s/p right foot wound excision, closure preformed on 02/03/2018.  He states he is doing well and he hopes the wound is healed.  Denies any drainage or pus.  No increase in swelling and there is been no redness.  He has been wearing a regular shoe.  No new concerns denies any systemic complaints such as fevers, chills, nausea, vomiting. No calf pain, chest pain, shortness of breath.   Objective: General: No acute distress, AAOx3 -presents today wearing a regular shoe. DP/PT pulses palpable 2/4, CRT < 3 sec to all digits.  RIGHT foot: Wound on the plantar aspect of the foot today is smaller measuring 1.8 x 0.6 x 1 cm.again the deeper area distally is about 1 cm is more superficial on the proximal aspect.  Overall the wound appears to be more granular.  Is slowly getting better.  There is no drainage or pus or any signs of an abscess.  No fluctuation or crepitation or any malodor. No other open lesions or pre-ulcerative lesions.  No pain with calf compression, swelling, warmth, erythema.   Assessment and Plan:  Chronic ulcer right foot, with improvement  -Treatment options discussed including all alternatives, risks, and complications -I sharply debrided the wound without any complications down to healthy, bleeding tissue utilizing tissue nipper as well as with a #312 blade scalpel.  I debrided the wound down to healthy, granular tissue.  Continue Santyl dressing changes daily and recommend offloading at all times.  I want to wear the surgical shoe with offloading.  Please been wearing a regular shoe.   -Monitor for any clinical signs or symptoms of infection and directed to call the office immediately should any occur or go to the ER.  Return in about 1 week   Trula Slade DPM

## 2018-04-23 ENCOUNTER — Encounter: Payer: Self-pay | Admitting: Podiatry

## 2018-04-23 ENCOUNTER — Ambulatory Visit (INDEPENDENT_AMBULATORY_CARE_PROVIDER_SITE_OTHER): Payer: Medicare HMO | Admitting: Podiatry

## 2018-04-23 VITALS — Temp 96.6°F

## 2018-04-23 DIAGNOSIS — L97512 Non-pressure chronic ulcer of other part of right foot with fat layer exposed: Secondary | ICD-10-CM

## 2018-04-25 NOTE — Progress Notes (Signed)
Subjective: Samuel Moran is a 72 y.o. is seen today in office s/p right foot wound excision, closure preformed on 02/03/2018.  He states that he is doing well.  He has no concerns today.  Is continue the Santyl dressing changes.  Had some minimal drainage and is been clear to bloody but no pus.  No increase in swelling or redness to his foot that he reports.  Denies any fevers, chills, nausea, vomiting.  No calf pain, chest pain, shortness of breath.  Objective: General: No acute distress, AAOx3 -presents today wearing a regular shoe. DP/PT pulses palpable 2/4, CRT < 3 sec to all digits.  RIGHT foot: Wound on the plantar aspect of the foot today is smaller measuring 2 x 1 x 0.8 cm.overall the wound is doing about the same.  There is no drainage or pus or any signs of an abscess.  No fluctuation or crepitation or any malodor. No other open lesions or pre-ulcerative lesions.  No pain with calf compression, swelling, warmth, erythema.   Assessment and Plan:  Chronic ulcer right foot, with improvement  -Treatment options discussed including all alternatives, risks, and complications -I sharply debrided the wound without any complications down to healthy, bleeding tissue utilizing tissue nipper as well as with a #312 blade scalpel.  I debrided the wound down to healthy, granular tissue.  We are going to switch to Prisma dressing changes daily.  I showed him how to do this today.  Do recommend continue with offloading at all times.  I think that because of his other medical conditions as well as some continued foot weight to this area is really delayed healing.  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   Return in about 1 week   Trula Slade DPM

## 2018-04-26 ENCOUNTER — Other Ambulatory Visit: Payer: Self-pay | Admitting: Neurology

## 2018-04-30 ENCOUNTER — Encounter: Payer: Self-pay | Admitting: Podiatry

## 2018-04-30 ENCOUNTER — Ambulatory Visit: Payer: Medicare HMO | Admitting: Podiatry

## 2018-04-30 VITALS — Temp 97.8°F

## 2018-04-30 DIAGNOSIS — L97512 Non-pressure chronic ulcer of other part of right foot with fat layer exposed: Secondary | ICD-10-CM | POA: Diagnosis not present

## 2018-05-01 DIAGNOSIS — G4733 Obstructive sleep apnea (adult) (pediatric): Secondary | ICD-10-CM | POA: Diagnosis not present

## 2018-05-04 NOTE — Progress Notes (Signed)
Subjective: Samuel Moran is a 72 y.o. is seen today in office s/p right foot wound excision, closure preformed on 02/03/2018.  He thinks the wound is getting somewhat better.  He has been applying Prisma to the wound every day.  He denies any drainage or pus coming from the area denies any increase in swelling or redness of the foot.  He did get a new pair shoes as well.  He thinks the wound is healing better now.  He has no other concerns. Denies any fevers, chills, nausea, vomiting.  No calf pain, chest pain, shortness of breath.  Objective: General: No acute distress, AAOx3 -presents today wearing a regular shoe. DP/PT pulses palpable 2/4, CRT < 3 sec to all digits.  RIGHT foot: Wound on the plantar aspect of the foot today is smaller measuring 2 x 1 x 0.5 cm.the wound has filled in more since last appointment.  There is no significant drainage or pus identified today and there is no surrounding erythema, ascending cellulitis.  There is no fluctuation or crepitation malodor.  No new ulcerations are present. No pain with calf compression, swelling, warmth, erythema.   Assessment and Plan:  Chronic ulcer right foot, with improvement  -Treatment options discussed including all alternatives, risks, and complications -I sharply debrided the wound without any complications down to healthy, bleeding tissue utilizing tissue nipper as well as with a #312 blade scalpel.  I debrided the wound down to healthy, granular tissue.  We are going to switch to Prisma dressing changes daily.  He does every other day unless there is drainage into every day.  I can continue to recommend offloading at all times.  I have tried him to wear surgical shoe and boot but he cannot wear this and he will not be nonweightbearing.  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  Return in about 1 week   Trula Slade DPM

## 2018-05-04 NOTE — Progress Notes (Signed)
error 

## 2018-05-07 ENCOUNTER — Encounter: Payer: Self-pay | Admitting: Podiatry

## 2018-05-07 ENCOUNTER — Ambulatory Visit (INDEPENDENT_AMBULATORY_CARE_PROVIDER_SITE_OTHER): Payer: Medicare HMO | Admitting: Podiatry

## 2018-05-07 VITALS — BP 140/92 | HR 76 | Temp 98.0°F | Resp 14

## 2018-05-07 DIAGNOSIS — L97512 Non-pressure chronic ulcer of other part of right foot with fat layer exposed: Secondary | ICD-10-CM

## 2018-05-12 NOTE — Progress Notes (Signed)
Subjective: Samuel Moran is a 73 y.o. is seen today in office today for follow-up evaluation of a wound on the right foot.  He has been continuing with the Prisma dressing changes every other day.  He states that this is been doing much better for him.  He is trying to elevate his foot as much as possible but he continues to wear a regular shoe.  Denies any increase in swelling or redness to the foot denies any drainage coming from the wound.  He has no other concerns today.Denies any fevers, chills, nausea, vomiting.  No calf pain, chest pain, shortness of breath.  Objective: General: No acute distress, AAOx3 -presents today wearing a regular shoe. DP/PT pulses palpable 2/4, CRT < 3 sec to all digits.  RIGHT foot: Wound on the plantar aspect of the foot today is smaller measuring 1.6 x 0.8 x 0.4 cm.  Overall the wound has improved compared to last appointment.  Prior to debridement there was some fibrotic tissue in the wound.  There is no probing, tunneling or undermining otherwise.  There is no significant drainage or pus identified today and there is no surrounding erythema, ascending cellulitis.  There is no fluctuation or crepitation malodor.  No new ulcerations are present. No pain with calf compression, swelling, warmth, erythema.   Assessment and Plan:  Chronic ulcer right foot, with improvement  -Treatment options discussed including all alternatives, risks, and complications -I sharply debrided the wound without any complications down to healthy, bleeding tissue utilizing tissue nipper as well as with a #312 blade scalpel.  I debrided the wound down to healthy, granular tissue.  We are going to continue with  Prisma dressing changes every other day unless there is drainage.  I can continue to recommend offloading at all times.  I have tried him to wear surgical shoe and boot but he cannot wear this and he will not be nonweightbearing.  Also he continues to drive so I am hesitant to do any  total contact cast.  Return in about 1 week   Trula Slade DPM

## 2018-05-20 ENCOUNTER — Encounter: Payer: Self-pay | Admitting: Cardiovascular Disease

## 2018-05-20 ENCOUNTER — Ambulatory Visit: Payer: Medicare HMO | Admitting: Cardiovascular Disease

## 2018-05-20 ENCOUNTER — Telehealth: Payer: Self-pay | Admitting: *Deleted

## 2018-05-20 VITALS — BP 138/90 | HR 88 | Ht 71.0 in | Wt 217.0 lb

## 2018-05-20 DIAGNOSIS — G20 Parkinson's disease: Secondary | ICD-10-CM

## 2018-05-20 DIAGNOSIS — I4819 Other persistent atrial fibrillation: Secondary | ICD-10-CM

## 2018-05-20 DIAGNOSIS — G4733 Obstructive sleep apnea (adult) (pediatric): Secondary | ICD-10-CM | POA: Diagnosis not present

## 2018-05-20 DIAGNOSIS — I10 Essential (primary) hypertension: Secondary | ICD-10-CM | POA: Diagnosis not present

## 2018-05-20 NOTE — Patient Instructions (Signed)
Medication Instructions:  The current medical regimen is effective;  continue present plan and medications.  If you need a refill on your cardiac medications before your next appointment, please call your pharmacy.    Follow-Up: At [facility], you and your health needs are our priority.  As part of our continuing mission to provide you with exceptional heart care, we have created designated Provider Care Teams.  These Care Teams include your primary Cardiologist (physician) and Advanced Practice Providers (APPs -  Physician Assistants and Nurse Practitioners) who all work together to provide you with the care you need, when you need it. You will need a follow up appointment in 6-8 weeks.  You may see Dr.Kelly (SLEEP) or one of the following Advanced Practice Providers on your designated Care Team: Almyra Deforest, [place] . Fabian Sharp, PA-C  Any Other Special Instructions Will Be Listed Below (If Applicable). Changes to Aflex is decreased to 2, start pressure starting at 8. Will send to machine.

## 2018-05-20 NOTE — Telephone Encounter (Signed)
-----   Message from Caprice Beaver, LPN sent at 06/11/2180 11:38 AM EST ----- Decreasing AFlex to 2. Starting Pressure at 8.

## 2018-05-20 NOTE — Telephone Encounter (Signed)
CPAP pressure change faxed to [facility] per VO/ Dr Claiborne Billings.

## 2018-05-20 NOTE — Progress Notes (Signed)
Cardiology Office Note    Date:  05/27/2018   ID:  Samuel Moran, Samuel Moran [birth year], MRN [id number]  PCP:  Samuel Lor, MD  Cardiologist:  Shelva Majestic, MD (sleep) ; Dr. Agustin Cree  New sleep evaluation  History of Present Illness:  Samuel Moran is a 73 y.o. male who presents for a new sleep evaluation after being diagnosed with obstructive sleep apnea and initiation of CPAP therapy.  Samuel Moran is a 73 year old gentleman who is followed by Dr. Agustin Cree.  He has a history of persistent atrial fibrillation, hypertension, hyperlipidemia, Parkinson's disease, osteomyelitis, and has experienced episodic palpitations.  He was referred for a sleep study which was done on December 27, 2017.  At the time he admitted to fatigue, snoring, and had witnessed apnea with episodes of gasping for breath during sleep.  He was found to have overall mild sleep apnea with an AHI of 7.1/h and RDI of 10.7/h.  However, he was unable to achieve any rem sleep.  AHI while supine was 13.7.  Minimum oxygen desaturation was 89%.  He had very poor sleep efficiency at only 35.5%.  PVCs were noted during the diagnostic polysomnogram.  He does not appear that he ever had a CPAP titration and apparently AutoPap was initiated.  His DME company is [employer].  He states he typically goes to bed at 10 PM but wakes up at 6 AM.  A download was obtained from March 20, 2018 through May 18, 2018.  He has a DreamStation auto CPAP unit.  He has been on a minimum auto pressure of 6 with a maximum pressure of 20.  He is not meeting compliance with only 60% of usage days and he was only averaging 2 hours and 29 minutes of CPAP use when used.  He did not feel comfortable with therapy and would often take his mask off.  He has been using a nasal mask.  He denies any significant excessive daytime sleepiness and Epworth sleepiness scale score was calculated in the office today and this endorsed at 5.  His sleep is nonrestorative.  He  continues to experience reduced sleep efficiency.  He denies any bruxism or painful restless legs.  He denies sleep paralysis.  He underwent attempted DC cardioversion for atrial fibrillation on March 15, 2018 which was unsuccessful.  He has been on anticoagulation.   He presents for initial sleep evaluation.  Past Medical History:  Diagnosis Date  . Atrial fibrillation (Samuel Moran)    onset 07/17/17  . Dysrhythmia   . ED (erectile dysfunction)   . GERD 07/01/2007  . Parkinson's disease ([place])   . 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 PROSTATE SPECIFIC ANTIGEN, ELEVATED 06/29/2007  . Rheumatoid arthritis ([place])   . SEIZURE DISORDER 07/01/2007   one epsiode only  . Spinal stenosis of lumbar region 09/22/2014    Past Surgical History:  Procedure Laterality Date  . AMPUTATION     right great toe - tramatic age 16  . CARDIOVERSION N/A 03/15/2018   Procedure: CARDIOVERSION;  Surgeon: [name], MD;  Location: [facility] ENDOSCOPY;  Service: Cardiovascular;  Laterality: N/A;  . COLONOSCOPY    . DEBRIDEMENT TOE Right 07/2016  . HAND SURGERY  03/2017   removal cysts  . IR FLUORO GUIDE CV LINE RIGHT  10/16/2016  . IR US GUIDE VASC ACCESS RIGHT  10/16/2016  . OSTECTOMY Right 08/05/2017   Procedure: OSTECTOMY COMPLETE METARSAL HEAD FIRST RIGHT;  Surgeon: Trula Slade, DPM;  Location: [place];  Service: Podiatry;  Laterality: Right;    Current Medications: Outpatient Medications Prior to Visit  Medication Sig Dispense Refill  . carbidopa-levodopa (SINEMET IR) 25-100 MG tablet TAKE 1 TABLET BY MOUTH THREE TIMES A DAY 270 tablet 1  . collagenase (SANTYL) ointment Apply 1 application topically daily. (Patient taking differently: Apply 1 application topically daily. Applied to foot with dressing change) 30 g 5  . diclofenac sodium (VOLTAREN) 1 % GEL Apply 2 g topically 4 (four) times daily. Rub into affected area of foot 2 to 4 times daily 100 g 2  . ibuprofen (ADVIL,MOTRIN) 200 MG tablet Take 800 mg  by mouth every 8 (eight) hours as needed (for pain/headaches.).    Marland Kitchen metoprolol succinate (TOPROL-XL) 50 MG 24 hr tablet TAKE 1 TABLET (50 MG TOTAL) BY MOUTH DAILY. TAKE WITH OR IMMEDIATELY FOLLOWING A MEAL. 90 tablet 0  . Multiple Vitamin (MULTIVITAMIN WITH MINERALS) TABS tablet Take 1 tablet by mouth daily. Centrum Silver    . NEUPRO 4 MG/24HR PLACE 1 PATCH ONTO THE SKIN DAILY. 30 patch 5  . NONFORMULARY OR COMPOUNDED ITEM [facility]:  Antiinflammatory cream #15 - Diclofenac 3%, Lidocaine 5%, Menthol 1%, apply 1-2 grams to affected area 3-4 times a day prn. 100 each 5  . Omega-3 Fatty Acids (FISH OIL) 1200 MG CPDR Take 1,200 mg by mouth daily.    Marland Kitchen omeprazole (PRILOSEC) 20 MG capsule Take 20 mg by mouth 2 (two) times daily before a meal.     . pneumococcal 13-valent conjugate vaccine (PREVNAR 13) SUSP injection Prevnar 13 (PF) 0.5 mL intramuscular syringe    . apixaban (ELIQUIS) 5 MG TABS tablet Take 5 mg by mouth 2 (two) times daily.      No facility-administered medications prior to visit.      Allergies:   Patient has no known allergies.   Social History   Socioeconomic History  . Marital status: Single    Spouse name: Not on file  . Number of children: Not on file  . Years of education: Not on file  . Highest education level: Not on file  Occupational History  . Occupation: Chartered certified accountant  Social Needs  . Financial resource strain: Not on file  . Food insecurity:    Worry: Not on file    Inability: Not on file  . Transportation needs:    Medical: Not on file    Non-medical: Not on file  Tobacco Use  . Smoking status: Light Tobacco Smoker    Types: Cigars  . Smokeless tobacco: Never Used  . Tobacco comment: 1 cigar, takes 3 weeks  Substance and Sexual Activity  . Alcohol use: Yes    Alcohol/week: 4.0 standard drinks    Types: 4 Standard drinks or equivalent per week    Comment: one drink a day (liquor)  . Drug use: No  . Sexual activity: Not on file    Lifestyle  . Physical activity:    Days per week: Not on file    Minutes per session: Not on file  . Stress: Not on file  Relationships  . Social connections:    Talks on phone: Not on file    Gets together: Not on file    Attends religious service: Not on file    Active member of club or organization: Not on file    Attends meetings of clubs or organizations: Not on file    Relationship status: Not on file  Other Topics Concern  . Not on file  Social History Narrative  . Not on file    Additional social history is notable that he was born in [place].  He is retired from [employer] certified accountant.  Family History:  The patient's family history includes Alcoholism in his mother; Arthritis in his father and mother.   His father died at age 55.  Mother died at age 60.  ROS General: Negative; No fevers, chills, or night sweats;  HEENT: Negative; No changes in vision or hearing, sinus congestion, difficulty swallowing Pulmonary: Negative; No cough, wheezing, shortness of breath, hemoptysis Cardiovascular: History of atrial fibrillation and hypertension as well as hyperlipidemia. GI: Negative; No nausea, vomiting, diarrhea, or abdominal pain GU: Negative; No dysuria, hematuria, or difficulty voiding Musculoskeletal: Negative; no myalgias, joint pain, or weakness Hematologic/Oncology: Negative; no easy bruising, bleeding Endocrine: Negative; no heat/cold intolerance; no diabetes Neuro: History of Parkinson's disease Skin: Negative; No rashes or skin lesions Psychiatric: Negative; No behavioral problems, depression Sleep: See HPI Other comprehensive 14 point system review is negative.   PHYSICAL EXAM:   VS:  BP 138/90 (BP Location: Left Arm, Patient Position: Sitting, Cuff Size: Normal)   Pulse 88   Ht 5' 11"  (1.803 m)   Wt 217 lb (98.4 kg)   BMI 30.27 kg/m     Repeat blood pressure by me 134/86  Wt Readings from Last 3 Encounters:  05/20/18 217 lb (98.4 kg)  04/12/18 212 lb (96.2  kg)  03/31/18 212 lb 1.9 oz (96.2 kg)    General: Alert, oriented, no distress.  Skin: normal turgor, no rashes, warm and dry HEENT: Normocephalic, atraumatic. Pupils equal round and reactive to light; sclera anicteric; extraocular muscles intact; Fundi without hemorrhages or exudates.  Discs flat  nose without nasal septal hypertrophy Mouth/Parynx benign; Mallinpatti scale 3 Neck: No JVD, no carotid bruits; normal carotid upstroke Lungs: clear to ausculatation and percussion; no wheezing or rales Chest wall: without tenderness to palpitation Heart: PMI not displaced, RRR, s1 s2 normal, 1/6 systolic murmur, no diastolic murmur, no rubs, gallops, thrills, or heaves Abdomen: soft, nontender; no hepatosplenomehaly, BS+; abdominal aorta nontender and not dilated by palpation. Back: no CVA tenderness Pulses 2+ Musculoskeletal: full range of motion, normal strength, no joint deformities Extremities: Right hand tremor; no clubbing, cyanosis or edema, Homan's sign negative  Neurologic: Right hand tremor, no weakness. Psychologic: Normal mood and affect   Studies/Labs Reviewed:   EKG:  EKG is ordered today.  ECG (independently read by me): Atrial fibrillation at 88 bpm.  Normal intervals.  Recent Labs: BMP Latest Ref Rng & Units 03/08/2018 01/08/2018 08/09/2017  Glucose 65 - 99 mg/dL 92 - 88  BUN 8 - 27 mg/dL 21 - 12  Creatinine 0.76 - 1.27 mg/dL 0.87 0.87 0.90  BUN/Creat Ratio 10 - 24 24 - -  Sodium 134 - 144 mmol/L 138 - 137  Potassium 3.5 - 5.2 mmol/L 4.2 - 3.7  Chloride 96 - 106 mmol/L 103 - 103  CO2 20 - 29 mmol/L 22 - 23  Calcium 8.6 - 10.2 mg/dL 9.0 - 8.8(L)     Hepatic Function Latest Ref Rng & Units 02/26/2017 07/30/2016 07/30/2016  Total Protein 6.0 - 8.3 g/dL 6.5 - 6.3  Albumin 3.5 - 5.2 g/dL 3.8 3.5(L) 3.4(L)  AST 0 - 37 U/L 22 - 15  ALT 0 - 53 U/L 14 - 12  Alk Phosphatase 39 - 117 U/L 49 - 56  Total Bilirubin 0.2 - 1.2 mg/dL 0.8 - 0.6  Bilirubin, Direct 0.0 - 0.3 mg/dL - -  -  CBC Latest Ref Rng & Units 03/08/2018 02/01/2018 08/09/2017  WBC 3.4 - 10.8 x10E3/uL 6.6 8.5 7.9  Hemoglobin 13.0 - 17.7 g/dL 13.6 13.1(L) 10.8(L)  Hematocrit 37.5 - 51.0 % 42.9 40.2 34.2(L)  Platelets 150 - 450 x10E3/uL 256 288 410(H)   Lab Results  Component Value Date   MCV 88 03/08/2018   MCV 82.4 02/01/2018   MCV 78.4 08/09/2017   Lab Results  Component Value Date   TSH 1.59 02/26/2017   No results found for: HGBA1C   BNP No results found for: BNP  ProBNP No results found for: PROBNP   Lipid Panel     Component Value Date/Time   CHOL 168 02/26/2017 0922   TRIG 55.0 02/26/2017 0922   HDL 52.90 02/26/2017 0922   CHOLHDL 3 02/26/2017 0922   VLDL 11.0 02/26/2017 0922   LDLCALC 104 (H) 02/26/2017 0922     RADIOLOGY: No results found.   Additional studies/ records that were reviewed today include:  I reviewed the records from Dr. Agustin Cree, his attempted cardioversion, diagnostic polysomnogram, and generated a download in addition to calculated Epworth Sleepiness Scale score.  ASSESSMENT:    1. OSA (obstructive sleep apnea)   2. Persistent atrial fibrillation   3. Essential hypertension   4. Parkinson disease [facility])     PLAN:  Samuel Moran is a 73 year old gentleman who has cardiovascular comorbidities including hypertension, hyperlipidemia, and has persistent atrial fibrillation.  He unfortunately failed an attempted cardioversion in November.  He was diagnosed as having at least mild overall sleep apnea which was worse with supine posture; however on his diagnostic polysomnogram he had very poor sleep efficiency at only 35% and was unable to achieve any REM sleep.  I had a very lengthy conversation with him in the office today.  I reviewed the effect of untreated sleep apnea on normal sleep architecture.  He has had issues with poor sleep efficiency with nonrestorative sleep as well as in the past having episodes of awakening gasping for breath.  I suspect  his sleep apnea is more significant than his diagnostic study AHI and most likely it would be more abnormal and REM sleep.  Presently, I reviewed normal sleep architecture and the fact that the preponderance of rem sleep occurs in the second half of night.  Presently he has only been using therapy for 2 hours and 29 minutes on average per night which is inadequate.  Since he is not having his mask on in the latter portion of the night I suspect his sleep is been very poor and he has not been able to achieve appropriate REM sleep.  I reviewed with him the Medicare compliance requirements of at least 70% of usage days and 70% of usage greater than 4 hours.  However, I have always recommended my patients that CPAP be used the entire night and not just meet the bare minimum Medicare compliance.  We discussed the adverse cardiovascular risk associated with sleep apnea which undoubtedly may be playing a role in his persistent atrial fibrillation.  We discussed the risk of recurrent AF of sleep apnea is untreated as well as effects on hypertension, nocturia, nocturnal arrhythmias including bradycardia and even heart block as well as potential glucose intolerance, increased inflammation, and increased GERD.  His ECG today confirms persistent atrial fibrillation.  He is on beta-blocker therapy with Toprol-XL 50 mg in addition to Eliquis for anticoagulation.  He continues to be on carbidopa/levodopa for his Parkinson's disease.  He has GERD  and takes omeprazole.  After a very lengthy discussion he agrees to initiate more prolonged therapy.  A repeat download will be obtained in approximately 1 month.  I will see him in 6 to 8 weeks for follow-up evaluation.  Time spent: 40 minutes   medication Adjustments/Labs and Tests Ordered: Current medicines are reviewed at length with the patient today.  Concerns regarding medicines are outlined above.  Medication changes, Labs and Tests ordered today are listed in the Patient  Instructions below. Patient Instructions  Medication Instructions:  The current medical regimen is effective;  continue present plan and medications.  If you need a refill on your cardiac medications before your next appointment, please call your pharmacy.    Follow-Up: At [facility], you and your health needs are our priority.  As Moran of our continuing mission to provide you with exceptional heart care, we have created designated Provider Care Teams.  These Care Teams include your primary Cardiologist (physician) and Advanced Practice Providers (APPs -  Physician Assistants and Nurse Practitioners) who all work together to provide you with the care you need, when you need it. You will need a follow up appointment in 6-8 weeks.  You may see Dr.Nesreen Albano (SLEEP) or one of the following Advanced Practice Providers on your designated Care Team: Almyra Deforest, [place] . Fabian Sharp, PA-C  Any Other Special Instructions Will Be Listed Below (If Applicable). Changes to Aflex is decreased to 2, start pressure starting at 8. Will send to machine.        Signed, Shelva Majestic, MD  05/27/2018 10:14 AM    [facility] [address] [place], [place], [place]  [address] Phone: [phone]

## 2018-05-21 ENCOUNTER — Encounter: Payer: Self-pay | Admitting: Podiatry

## 2018-05-21 ENCOUNTER — Ambulatory Visit (INDEPENDENT_AMBULATORY_CARE_PROVIDER_SITE_OTHER): Payer: Medicare HMO | Admitting: Podiatry

## 2018-05-21 VITALS — BP 149/86 | HR 79 | Temp 97.2°F | Resp 16

## 2018-05-21 DIAGNOSIS — L97512 Non-pressure chronic ulcer of other part of right foot with fat layer exposed: Secondary | ICD-10-CM | POA: Diagnosis not present

## 2018-05-23 ENCOUNTER — Other Ambulatory Visit: Payer: Self-pay | Admitting: Cardiology

## 2018-05-25 NOTE — Progress Notes (Signed)
Subjective: Samuel Moran is a 73 y.o. is seen today in office today for follow-up evaluation of a wound on the right foot.  He has been continuing with the Prisma dressing changes every other day.  Overall he states he is doing better he feels that the wound is healing in.  He denies any drainage or pus coming from the area denies any increase in swelling or redness.  He thinks that overall is getting better.  He has no other concerns. Denies any fevers, chills, nausea, vomiting.  No calf pain, chest pain, shortness of breath.  Objective: General: No acute distress, AAOx3 -presents today wearing a regular shoe. DP/PT pulses palpable 2/4, CRT < 3 sec to all digits.  RIGHT foot: Wound on the plantar aspect of the foot today is smaller measuring 1.4 x 0.8 x 0.3 cm.  Overall the wound has improved compared to last appointment.  There is no increase in edema, erythema, drainage or pus.  After debridement the wound is granular however prior to debridement there was more fibrotic tissue present.  There is no probing, undermining or tunneling.  No surrounding erythema, ascending cellulitis.  No fluctuation crepitation any malodor.  No pain with calf compression, swelling, warmth, erythema.   Assessment and Plan:  Chronic ulcer right foot, with improvement  -Treatment options discussed including all alternatives, risks, and complications -I sharply debrided the wound without any complications down to healthy, bleeding tissue utilizing tissue nipper as well as with a #312 blade scalpel.  I debrided the wound down to healthy, granular tissue.  We are going to continue with  Prisma dressing changes every other day unless there is drainage.  I can continue to recommend offloading at all times.   Return in about 1 week   Trula Slade DPM

## 2018-05-27 ENCOUNTER — Encounter: Payer: Self-pay | Admitting: Cardiovascular Disease

## 2018-05-28 ENCOUNTER — Telehealth: Payer: Self-pay | Admitting: Neurology

## 2018-05-28 NOTE — Telephone Encounter (Signed)
Patient is wanting to be seen sooner than his April appt he is on the wait list but would like to talk to someone because the shaking is getting worst and is now in both hands

## 2018-05-30 ENCOUNTER — Encounter: Payer: Self-pay | Admitting: Cardiovascular Disease

## 2018-05-31 ENCOUNTER — Ambulatory Visit (INDEPENDENT_AMBULATORY_CARE_PROVIDER_SITE_OTHER): Payer: Medicare HMO | Admitting: Cardiology

## 2018-05-31 ENCOUNTER — Encounter: Payer: Self-pay | Admitting: Cardiology

## 2018-05-31 VITALS — BP 146/80 | HR 88 | Ht 71.0 in | Wt 216.1 lb

## 2018-05-31 DIAGNOSIS — G4733 Obstructive sleep apnea (adult) (pediatric): Secondary | ICD-10-CM

## 2018-05-31 DIAGNOSIS — I4819 Other persistent atrial fibrillation: Secondary | ICD-10-CM

## 2018-05-31 DIAGNOSIS — G20 Parkinson's disease: Secondary | ICD-10-CM

## 2018-05-31 DIAGNOSIS — I10 Essential (primary) hypertension: Secondary | ICD-10-CM

## 2018-05-31 MED ORDER — FLECAINIDE ACETATE 50 MG PO TABS: 50.0000 mg | ORAL_TABLET | Freq: Two times a day (BID) | ORAL | 0 refills | Status: DC

## 2018-05-31 NOTE — Progress Notes (Signed)
Cardiology Office Note:    Date:  05/31/2018   ID:  Samuel Moran, DOB [birth year], MRN [id number]  PCP:  [name], MD  Cardiologist:  Jenne Campus, MD    Referring MD: [name], MD   Chief Complaint  Patient presents with  . Follow up on Lexi  Doing better  History of Present Illness:    Samuel Moran is a 73 y.o. male with persistent atrial fibrillation that he is been having for almost a year.  We attempt to cardiovert him to sinus rhythm however shortly after cardioversion he went back to atrial fibrillation after that discussion continue regarding rhythm versus rate control he prefers rhythm control I did echocardiogram on him which showed preserved left ventricular ejection fraction sadly moderate enlargement of the left atrium.  Also did stress test recently which showed no evidence of ischemia all those tests were done in preparation of antiarrhythmic therapy I think flecainide will be the best medication for him he is ready to initiate that medication the goal will be to continue medication for a month and then I bring him back to my office to check the rhythm and then if everything is fine we will attempt to cardiovert him again.  I told him however if we fail again we probably cannot abandon the rhythm control strategy unless if he will insist on it.  Overall he is doing better he started using CPAP mask on the regular basis after long conversation with sleep specialist.  He also went back to gym.  Past Medical History:  Diagnosis Date  . Atrial fibrillation ([place])    onset 07/17/17  . Dysrhythmia   . ED (erectile dysfunction)   . GERD 07/01/2007  . Parkinson's disease ([place])   . 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 PROSTATE SPECIFIC ANTIGEN, ELEVATED 06/29/2007  . Rheumatoid arthritis ([place])   . SEIZURE DISORDER 07/01/2007   one epsiode only  . Spinal stenosis of lumbar region 09/22/2014    Past Surgical History:  Procedure Laterality Date    . AMPUTATION     right great toe - tramatic age 58  . CARDIOVERSION N/A 03/15/2018   Procedure: CARDIOVERSION;  Surgeon: [name], MD;  Location: [facility] ENDOSCOPY;  Service: Cardiovascular;  Laterality: N/A;  . COLONOSCOPY    . DEBRIDEMENT TOE Right 07/2016  . HAND SURGERY  03/2017   removal cysts  . IR FLUORO GUIDE CV LINE RIGHT  10/16/2016  . IR US GUIDE VASC ACCESS RIGHT  10/16/2016  . OSTECTOMY Right 08/05/2017   Procedure: OSTECTOMY COMPLETE METARSAL HEAD FIRST RIGHT;  Surgeon: Trula Slade, DPM;  Location: [facility];  Service: Podiatry;  Laterality: Right;    Current Medications: Current Meds  Medication Sig  . carbidopa-levodopa (SINEMET IR) 25-100 MG tablet TAKE 1 TABLET BY MOUTH THREE TIMES A DAY  . collagenase (SANTYL) ointment Apply 1 application topically daily. (Patient taking differently: Apply 1 application topically daily. Applied to foot with dressing change)  . diclofenac sodium (VOLTAREN) 1 % GEL Apply 2 g topically 4 (four) times daily. Rub into affected area of foot 2 to 4 times daily  . ELIQUIS 5 MG TABS tablet TAKE 1 TABLET BY MOUTH TWICE A DAY  . ibuprofen (ADVIL,MOTRIN) 200 MG tablet Take 800 mg by mouth every 8 (eight) hours as needed (for pain/headaches.).  Marland Kitchen metoprolol succinate (TOPROL-XL) 50 MG 24 hr tablet TAKE 1 TABLET (50 MG TOTAL) BY MOUTH DAILY. TAKE WITH OR IMMEDIATELY FOLLOWING A MEAL.  Marland Kitchen  Multiple Vitamin (MULTIVITAMIN WITH MINERALS) TABS tablet Take 1 tablet by mouth daily. Centrum Silver  . NEUPRO 4 MG/24HR PLACE 1 PATCH ONTO THE SKIN DAILY.  . NONFORMULARY OR COMPOUNDED ITEM [facility]:  Antiinflammatory cream #15 - Diclofenac 3%, Lidocaine 5%, Menthol 1%, apply 1-2 grams to affected area 3-4 times a day prn.  . Omega-3 Fatty Acids (FISH OIL) 1200 MG CPDR Take 1,200 mg by mouth daily.  Marland Kitchen omeprazole (PRILOSEC) 20 MG capsule Take 20 mg by mouth 2 (two) times daily before a meal.   . pneumococcal 13-valent conjugate vaccine (PREVNAR 13)  SUSP injection Prevnar 13 (PF) 0.5 mL intramuscular syringe     Allergies:   Patient has no known allergies.   Social History   Socioeconomic History  . Marital status: Single    Spouse name: Not on file  . Number of children: Not on file  . Years of education: Not on file  . Highest education level: Not on file  Occupational History  . Occupation: Chartered certified accountant  Social Needs  . Financial resource strain: Not on file  . Food insecurity:    Worry: Not on file    Inability: Not on file  . Transportation needs:    Medical: Not on file    Non-medical: Not on file  Tobacco Use  . Smoking status: Light Tobacco Smoker    Types: Cigars  . Smokeless tobacco: Never Used  . Tobacco comment: 1 cigar, takes 3 weeks  Substance and Sexual Activity  . Alcohol use: Yes    Alcohol/week: 4.0 standard drinks    Types: 4 Standard drinks or equivalent per week    Comment: one drink a day (liquor)  . Drug use: No  . Sexual activity: Not on file  Lifestyle  . Physical activity:    Days per week: Not on file    Minutes per session: Not on file  . Stress: Not on file  Relationships  . Social connections:    Talks on phone: Not on file    Gets together: Not on file    Attends religious service: Not on file    Active member of club or organization: Not on file    Attends meetings of clubs or organizations: Not on file    Relationship status: Not on file  Other Topics Concern  . Not on file  Social History Narrative  . Not on file     Family History: The patient's family history includes Alcoholism in his mother; Arthritis in his father and mother. There is no history of Colon cancer, Esophageal cancer, Rectal cancer, or Stomach cancer. ROS:   Please see the history of present illness.    All 14 point review of systems negative except as described per history of present illness  EKGs/Labs/Other Studies Reviewed:      Recent Labs: 08/05/2017: Magnesium 1.9 03/08/2018: BUN 21;  Creatinine, Ser 0.87; Hemoglobin 13.6; Platelets 256; Potassium 4.2; Sodium 138  Recent Lipid Panel    Component Value Date/Time   CHOL 168 02/26/2017 0922   TRIG 55.0 02/26/2017 0922   HDL 52.90 02/26/2017 0922   CHOLHDL 3 02/26/2017 0922   VLDL 11.0 02/26/2017 0922   LDLCALC 104 (H) 02/26/2017 0922    Physical Exam:    VS:  BP (!) 146/80   Pulse 88   Ht 5\' 11"  (1.803 m)   Wt 216 lb 1.9 oz (98 kg)   SpO2 96%   BMI 30.14 kg/m     Wt  Readings from Last 3 Encounters:  05/31/18 216 lb 1.9 oz (98 kg)  05/20/18 217 lb (98.4 kg)  04/12/18 212 lb (96.2 kg)     GEN:  Well nourished, well developed in no acute distress HEENT: Normal NECK: No JVD; No carotid bruits LYMPHATICS: No lymphadenopathy CARDIAC: RRR, no murmurs, no rubs, no gallops RESPIRATORY:  Clear to auscultation without rales, wheezing or rhonchi  ABDOMEN: Soft, non-tender, non-distended MUSCULOSKELETAL:  No edema; No deformity  SKIN: Warm and dry LOWER EXTREMITIES: no swelling NEUROLOGIC:  Alert and oriented x 3 PSYCHIATRIC:  Normal affect   ASSESSMENT:    1. Persistent atrial fibrillation   2. Parkinson disease ([place])   3. Essential hypertension   4. Obstructive sleep apnea    PLAN:    In order of problems listed above:  1. Persistent atrial fibrillation anticoagulated which I will continue.  Will initiate flecainide 50 mg twice daily.  His EKG today showed normal QT interval normal QRS complex duration morphology.  I bring him back to my office in about 3 days to repeat EKG and then in the future we will do plain EKG treadmill stress test.  Ultimate goal will be to try to attempt to cardiovert him in about  month. 2. He was warned about potential side effect of flecainide which include dizziness palpitations or passing out he will let me know if any of this will happen Parkinson's disease stable on appropriate medications. 3. Essential hypertension blood pressure well controlled continue present  management. 4. Obstructive sleep apnea managed with CPAP mask.   Medication Adjustments/Labs and Tests Ordered: Current medicines are reviewed at length with the patient today.  Concerns regarding medicines are outlined above.  No orders of the defined types were placed in this encounter.  Medication changes: No orders of the defined types were placed in this encounter.   Signed, Park Liter, MD, [facility] 05/31/2018 9:33 AM    [place]

## 2018-05-31 NOTE — Patient Instructions (Signed)
Medication Instructions:  Your physician has recommended you make the following change in your medication:  START Flecainide 50 mg 1 table 2 times daily (every 12 hours)  If you need a refill on your cardiac medications before your next appointment, please call your pharmacy.   Lab work: None ordered If you have labs (blood work) drawn today and your tests are completely normal, you will receive your results only by: Marland Kitchen MyChart Message (if you have MyChart) OR . A paper copy in the mail If you have any lab test that is abnormal or we need to change your treatment, we will call you to review the results.  Testing/Procedures: EKG Today  Follow-Up: At [facility], you and your health needs are our priority.  As part of our continuing mission to provide you with exceptional heart care, we have created designated Provider Care Teams.  These Care Teams include your primary Cardiologist (physician) and Advanced Practice Providers (APPs -  Physician Assistants and Nurse Practitioners) who all work together to provide you with the care you need, when you need it. We will have you follow up in 3 days for an EKG. You will need a follow up appointment in 1 months.  Please call our office 2 months in advance to schedule this appointment.  You may see  Jenne Campus or another member of our [employer] Provider Team in [place]: Shirlee More, MD . Jyl Heinz, MD  Any Other Special Instructions Will Be Listed Below (If Applicable).

## 2018-05-31 NOTE — Telephone Encounter (Signed)
Left message on machine for patient to call back.

## 2018-06-01 NOTE — Telephone Encounter (Signed)
Spoke with patient. He states that he is starting to feel worse in regards to PD symptoms. He states more unsteady. He finished physical therapy for his back about a month ago but can't remember when the last time he did PT for PD.  He also states tremor is getting worse in right hand and is now starting to develop in the left. He didn't want to wait until April to discuss.  He is currently taking Neupro patch 4 mg daily along with Carbidopa Levodopa 25/100 IR 1 tablet between 6-8 am, 1 tablet between 3-5 pm, and 1 tablet at 9 pm. I advised he is spreading these dosages out too far (this was documented in his last office visit note as well). I advised him to move his dosages to 8 am, 12 pm, and 4 pm. He will try this and see if it helps symptoms during the day.  Aware I am sending this message to Dr. Carles Collet for further advise. Dr. Carles Collet - Please advise.

## 2018-06-01 NOTE — Telephone Encounter (Signed)
Agree with advice.  And unfortunately, I don't have a work in [place] for him right now.  I have worked him in a lot for concerns and haven't seen signficant deterioration, but have seen a lot of anxiety about the disease.

## 2018-06-01 NOTE — Telephone Encounter (Signed)
Left message on machine for patient to let him know Dr. Carles Collet agreed with medication time change and did not have any advise to add at this time. To call back with any questions.

## 2018-06-04 ENCOUNTER — Ambulatory Visit (INDEPENDENT_AMBULATORY_CARE_PROVIDER_SITE_OTHER): Payer: Medicare HMO | Admitting: Cardiology

## 2018-06-04 ENCOUNTER — Ambulatory Visit: Payer: Medicare HMO | Admitting: Cardiology

## 2018-06-04 ENCOUNTER — Ambulatory Visit (INDEPENDENT_AMBULATORY_CARE_PROVIDER_SITE_OTHER): Payer: Medicare HMO | Admitting: Podiatry

## 2018-06-04 ENCOUNTER — Encounter: Payer: Self-pay | Admitting: Cardiology

## 2018-06-04 VITALS — BP 130/70 | HR 82 | Ht 71.0 in | Wt 217.1 lb

## 2018-06-04 DIAGNOSIS — I4819 Other persistent atrial fibrillation: Secondary | ICD-10-CM | POA: Diagnosis not present

## 2018-06-04 DIAGNOSIS — L97512 Non-pressure chronic ulcer of other part of right foot with fat layer exposed: Secondary | ICD-10-CM | POA: Diagnosis not present

## 2018-06-04 NOTE — Progress Notes (Signed)
Subjective: Samuel Moran is a 73 y.o. is seen today in office today for follow-up evaluation of a wound on the right foot.  He has been continuing with the Prisma dressing changes every other day.  He states that overall the wound is doing much better.  Denies any drainage or pus coming from the area denies any increase in swelling or redness.  His friend does change the bandage daily for him with the Prisma.  He has no other concerns except that he gets some prominence on the outsides of both of his feet which cause him occasional discomfort this is been a chronic issue.. Denies any fevers, chills, nausea, vomiting.  No calf pain, chest pain, shortness of breath.  Objective: General: No acute distress, AAOx3 -presents today wearing a regular shoe. DP/PT pulses palpable 2/4, CRT < 3 sec to all digits.  RIGHT foot: Wound on the plantar aspect of the foot today is smaller measuring 1 x 0.5 x 0.3 cm.  Overall the wound has improved compared to last appointment.  There is no edema, erythema, drainage or pus there is no clinical signs of infection.  There is no probing, undermining or tunneling.  No fluctuation or crepitation. Prominence of the fifth metatarsal bases bilaterally.  This is where he gets discomfort with walking No pain with calf compression, swelling, warmth, erythema.   Assessment and Plan:  Chronic ulcer right foot, with improvement; prominent fifth metatarsal bases  -Treatment options discussed including all alternatives, risks, and complications -I sharply debrided the wound without any complications down to healthy, bleeding tissue utilizing tissue nipper as well as with a #312 blade scalpel.  I debrided the wound down to healthy, granular tissue.  We are going to continue with daily dressing changes with Medihoney.  If there is any worsening go back to using prisma.  I can continue to recommend offloading at all times.    RTC 2-3 weeks with myself but also with Liliane Channel for inserts  to help offload.   Trula Slade DPM

## 2018-06-04 NOTE — Patient Instructions (Signed)
Medication Instructions:  Your physician recommends that you continue on your current medications as directed. Please refer to the Current Medication list given to you today.  If you need a refill on your cardiac medications before your next appointment, please call your pharmacy.   Lab work: NONE If you have labs (blood work) drawn today and your tests are completely normal, you will receive your results only by: . MyChart Message (if you have MyChart) OR . A paper copy in the mail If you have any lab test that is abnormal or we need to change your treatment, we will call you to review the results.  Testing/Procedures: NONE  Follow-Up: At [facility], you and your health needs are our priority.  As part of our continuing mission to provide you with exceptional heart care, we have created designated Provider Care Teams.  These Care Teams include your primary Cardiologist (physician) and Advanced Practice Providers (APPs -  Physician Assistants and Nurse Practitioners) who all work together to provide you with the care you need, when you need it. You will need a follow up appointment in 3 weeks.   

## 2018-06-06 ENCOUNTER — Other Ambulatory Visit: Payer: Self-pay | Admitting: Neurology

## 2018-06-14 IMAGING — US IR FLUORO GUIDE CV LINE*R*
1 series · 2 of 2 positions shown · IV contrast (agent unspecified)
Comparison: none

INDICATION: History of osteomyelitis. In need of durable intravenous access for
medication administration.

EXAM:
ULTRASOUND AND FLUOROSCOPIC GUIDED PICC LINE INSERTION
MEDICATIONS:
None.
CONTRAST:  None
FLUOROSCOPY TIME:  12 seconds (0.7 MGy)
COMPLICATIONS:
None immediate.
TECHNIQUE: The procedure, risks, benefits, and alternatives were explained to
the patient and informed written consent was obtained. A timeout was
performed prior to the initiation of the procedure.

[Series 1: ir fluoro/shunt/fist · 2 of 2 slices shown]
[im 1/2]
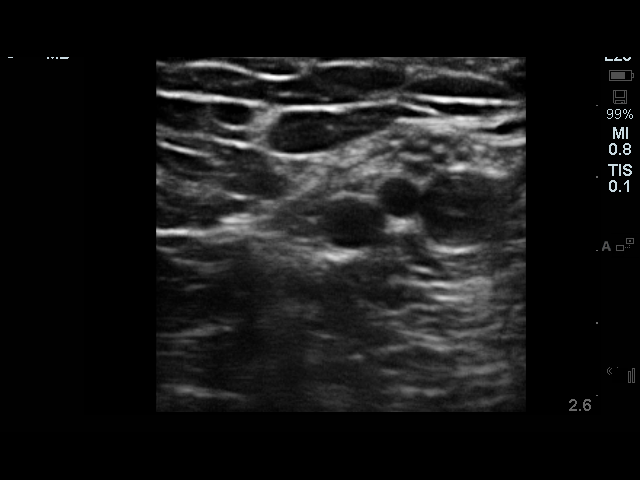
[im 2/2]
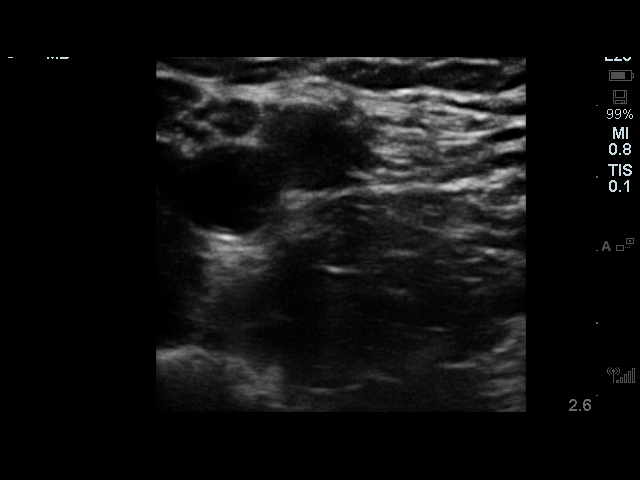

[2 of 2 positions shown; findings below may reference images not displayed]

The right upper extremity was prepped with chlorhexidine in a
sterile fashion, and a sterile drape was applied covering the
operative field. Maximum barrier sterile technique with sterile
gowns and gloves were used for the procedure. A timeout was
performed prior to the initiation of the procedure. Local anesthesia
was provided with 1% lidocaine.

Under direct ultrasound guidance, the brachial vein was accessed
with a micropuncture kit after the overlying soft tissues were
anesthetized with 1% lidocaine.

After the overlying soft tissues were anesthetized, a small venotomy
incision was created and a micropuncture kit was utilized to access
the right brachial vein. Real-time ultrasound guidance was utilized
for vascular access including the acquisition of a permanent
ultrasound image documenting patency of the accessed vessel.

A guidewire was advanced to the level of the superior caval-atrial
junction for measurement purposes and the PICC line was cut to
length. A peel-away sheath was placed and a 36 cm, 5 French, single
lumen was inserted to level of the superior caval-atrial junction. A
post procedure spot fluoroscopic was obtained. The catheter easily
aspirated and flushed and was sutured in place. A dressing was
placed. The patient tolerated the procedure well without immediate
post procedural complication.
FINDINGS: After catheter placement, the tip lies within the superior
cavoatrial junction. The catheter aspirates and flushes normally and
is ready for immediate use.
IMPRESSION: Successful ultrasound and fluoroscopic guided placement of a right
brachia vein approach, 36 cm, 5 French, single lumen PICC with tip
at the superior caval-atrial junction. The PICC line is ready for
immediate use.

## 2018-06-21 ENCOUNTER — Other Ambulatory Visit: Payer: Self-pay

## 2018-06-21 ENCOUNTER — Telehealth: Payer: Self-pay | Admitting: *Deleted

## 2018-06-21 ENCOUNTER — Ambulatory Visit: Payer: Medicare HMO | Admitting: Podiatry

## 2018-06-21 DIAGNOSIS — R2242 Localized swelling, mass and lump, left lower limb: Secondary | ICD-10-CM

## 2018-06-21 DIAGNOSIS — L97511 Non-pressure chronic ulcer of other part of right foot limited to breakdown of skin: Secondary | ICD-10-CM

## 2018-06-21 DIAGNOSIS — M7989 Other specified soft tissue disorders: Secondary | ICD-10-CM

## 2018-06-21 NOTE — Telephone Encounter (Deleted)
-----   Message from Trula Slade, DPM sent at 06/21/2018 11:35 AM EST ----- Can you please order an ultrasound of the left foot to evaluate a cyst on the left lateral midfoot? Thanks.

## 2018-06-22 ENCOUNTER — Other Ambulatory Visit: Payer: Self-pay | Admitting: Cardiology

## 2018-06-22 NOTE — Progress Notes (Addendum)
Subjective: Samuel Moran is a 73 y.o. is seen today in office today for follow-up evaluation of a wound on the right foot.  Today he also has concerns of increased swelling to his left foot.  He has been having some pain issues since having seen him on the fifth metatarsal base bilaterally.  We agreed to try to offload the area however we have had to put this on hold as prescription given the wound healed the right side.  Today he was exercised we measured for inserts to help.  However since I last saw him more he has been on his foot he has noted some increase in swelling to the bottom of his foot localized along this area.  Denies any redness or warmth. Denies any fevers, chills, nausea, vomiting.  No calf pain, chest pain, shortness of breath.  Objective: General: No acute distress, AAOx3 -presents today wearing a regular shoe. DP/PT pulses palpable 2/4, CRT < 3 sec to all digits.  RIGHT foot: Wound on the plantar aspect of the foot today is smaller measuring 0.5 x 0.4 x 0.2 cm.  Overall the wound has improved compared to last appointment.  Today starting to dry over to be almost healed.  There is no edema, erythema, drainage or pus there is no clinical signs of infection.  There is no probing, undermining or tunneling.  No fluctuation or crepitation. Prominence of the fifth metatarsal bases bilaterally.  This is where he gets discomfort with walking.  Today there is increased swelling localized to the fifth metatarsal base plantar.  Does appear to be fluid-filled. No pain with calf compression, swelling, warmth, erythema.   Assessment and Plan:  Chronic ulcer right foot, with improvement; prominent fifth metatarsal bases; tissue mass left foot  -Treatment options discussed including all alternatives, risks, and complications -I sharply debrided the wound without any complications down to healthy, bleeding tissue utilizing tissue nipper as well as with a #312 blade scalpel.  I debrided the  wound down to healthy, granular tissue.  We are going to continue with daily dressing changes with Medihoney.  Regular shoe without any issues. -To the left foot he is having increased swelling to this area denies any recent injury or falls I discussed trying to aspirate the area today the skin with Betadine.  An 18-gauge needle was then inserted into the soft tissue mass.  I was unable to get any fluid mass.  I did perform a steroid injection including 1 cc Kenalog 10, 0.5% Marcaine plain, 0.5 cc of lidocaine plain.  A compression bandage was applied.  He tolerated well there are any complications.  Would order an ultrasound of this area.  It is more of a large bursitis, almost like what he had previously on the right foot.  *x-ray left foot next appointment   Trula Slade DPM

## 2018-06-22 NOTE — Telephone Encounter (Signed)
-----   Message from Trula Slade, DPM sent at 06/21/2018 11:35 AM EST ----- Can you please order an ultrasound of the left foot to evaluate a cyst on the left lateral midfoot? Thanks.

## 2018-06-22 NOTE — Telephone Encounter (Signed)
Orders called to [facility].

## 2018-06-24 ENCOUNTER — Telehealth: Payer: Self-pay | Admitting: *Deleted

## 2018-06-24 DIAGNOSIS — R2242 Localized swelling, mass and lump, left lower limb: Principal | ICD-10-CM

## 2018-06-24 DIAGNOSIS — M7989 Other specified soft tissue disorders: Secondary | ICD-10-CM

## 2018-06-24 NOTE — Telephone Encounter (Signed)
[facility] states there is a right foot diagnosis on [place] Left foot order. I told Samuel Moran I would reorder with left foot dx only. Faxed updated [place] orders to [facility].

## 2018-06-25 ENCOUNTER — Telehealth: Payer: Self-pay | Admitting: *Deleted

## 2018-06-25 NOTE — Telephone Encounter (Signed)
Pt states he has not received a call from [facility] to schedule for a scan.

## 2018-06-25 NOTE — Telephone Encounter (Signed)
Left message informing pt of [facility] [phone] to schedule his appt, the orders had been sent to [facility] on the day of his last appt.

## 2018-07-05 ENCOUNTER — Ambulatory Visit: Payer: Medicare HMO | Admitting: Cardiology

## 2018-07-05 ENCOUNTER — Ambulatory Visit
Admission: RE | Admit: 2018-07-05 | Discharge: 2018-07-05 | Disposition: A | Discharge status: Home / Self Care | Payer: Medicare HMO | Source: Ambulatory Visit | Attending: Podiatry | Admitting: Podiatry

## 2018-07-05 DIAGNOSIS — R2242 Localized swelling, mass and lump, left lower limb: Principal | ICD-10-CM

## 2018-07-05 DIAGNOSIS — M7989 Other specified soft tissue disorders: Secondary | ICD-10-CM

## 2018-07-05 DIAGNOSIS — M85671 Other cyst of bone, right ankle and foot: Secondary | ICD-10-CM | POA: Diagnosis not present

## 2018-07-05 NOTE — Telephone Encounter (Signed)
[facility] states pt says all his problems have been on the right foot and does not feel the [place] should be of the left foot. I reviewed clinical and the left foot soft tissue mass was lateral midfoot and had not been able to be aspirated at the last appt.

## 2018-07-06 ENCOUNTER — Ambulatory Visit: Payer: Medicare HMO | Admitting: Physician Assistant

## 2018-07-06 DIAGNOSIS — E669 Obesity, unspecified: Secondary | ICD-10-CM | POA: Diagnosis not present

## 2018-07-06 DIAGNOSIS — Z79899 Other long term (current) drug therapy: Secondary | ICD-10-CM | POA: Diagnosis not present

## 2018-07-06 DIAGNOSIS — M2550 Pain in unspecified joint: Secondary | ICD-10-CM | POA: Diagnosis not present

## 2018-07-06 DIAGNOSIS — L089 Local infection of the skin and subcutaneous tissue, unspecified: Secondary | ICD-10-CM | POA: Diagnosis not present

## 2018-07-06 DIAGNOSIS — Z6830 Body mass index (BMI) 30.0-30.9, adult: Secondary | ICD-10-CM | POA: Diagnosis not present

## 2018-07-06 DIAGNOSIS — M0630 Rheumatoid nodule, unspecified site: Secondary | ICD-10-CM | POA: Diagnosis not present

## 2018-07-06 DIAGNOSIS — M0579 Rheumatoid arthritis with rheumatoid factor of multiple sites without organ or systems involvement: Secondary | ICD-10-CM | POA: Diagnosis not present

## 2018-07-06 DIAGNOSIS — G20 Parkinson's disease: Secondary | ICD-10-CM | POA: Diagnosis not present

## 2018-07-08 ENCOUNTER — Encounter: Payer: Self-pay | Admitting: Physician Assistant

## 2018-07-08 ENCOUNTER — Ambulatory Visit: Payer: Medicare HMO | Admitting: Physician Assistant

## 2018-07-08 VITALS — BP 140/93 | HR 82 | Ht 71.0 in | Wt 218.0 lb

## 2018-07-08 DIAGNOSIS — G4733 Obstructive sleep apnea (adult) (pediatric): Secondary | ICD-10-CM | POA: Diagnosis not present

## 2018-07-08 DIAGNOSIS — G20 Parkinson's disease: Secondary | ICD-10-CM | POA: Diagnosis not present

## 2018-07-08 DIAGNOSIS — I4819 Other persistent atrial fibrillation: Secondary | ICD-10-CM | POA: Diagnosis not present

## 2018-07-08 NOTE — Patient Instructions (Signed)
Medication Instructions:  Your physician recommends that you continue on your current medications as directed. Please refer to the Current Medication list given to you today.  If you need a refill on your cardiac medications before your next appointment, please call your pharmacy.   Lab work: CBC BMET  YOU WILL NEED TO HAVE YOUR BLOOD DRAWN ON July 23, 2018 FOR YOUR CARDIOVERSION   If you have labs (blood work) drawn today and your tests are completely normal, you will receive your results only by: Marland Kitchen MyChart Message (if you have MyChart) OR . A paper copy in the mail If you have any lab test that is abnormal or we need to change your treatment, we will call you to review the results.  Testing/Procedures: CARDIOVERSION is scheduled for July 26, 2018 at 8 AM Your physician has recommended that you have a Cardioversion (DCCV). Electrical Cardioversion uses a jolt of electricity to your heart either through paddles or wired patches attached to your chest. This is a controlled, usually prescheduled, procedure. Defibrillation is done under light anesthesia in the hospital, and you usually go home the day of the procedure. This is done to get your heart back into a normal rhythm. You are not awake for the procedure. Please see the instruction sheet given to you today.   Follow-Up: Your physician recommends that you schedule a follow-up appointment AFTER YOUR CARDIOVERSION WITH Jenne Campus, MD   Any Other Special Instructions Will Be Listed Below (If Applicable). NONE  CARDIOVERSION INSTRUCTIONS   Dear Mr. Keandre Linden,  You are scheduled for a Cardioversion on July 26, 2018 with Dr. Johnsie Cancel.  Please arrive at the [facility] (Main Entrance A) at [facility]: [address] [place], [place] [address] at 8 am.   DIET: Nothing to eat or drink after midnight except a sip of water with medications (see medication instructions below)  Medication Instructions:   Continue your  anticoagulant: ELIQUIS You will need to continue your anticoagulant after your procedure until you are told by your  Provider that it is safe to stop   You must have a responsible person to drive you home and stay in the waiting area during your procedure. Failure to do so could result in cancellation.  Bring your insurance cards.  *Special Note: Every effort is made to have your procedure done on time. Occasionally there are emergencies that occur at the hospital that may cause delays. Please be patient if a delay does occur.

## 2018-07-08 NOTE — Progress Notes (Signed)
Cardiology Office Note    Date:  07/10/2018   ID:  Samuel Moran, Alferd Apa [birth year], MRN [id number]  PCP:  [name], MD  Cardiologist:  Dr. Agustin Cree  Chief Complaint  Patient presents with  . Follow-up    seen for Dr. Agustin Cree    History of Present Illness:  Samuel Moran is a 73 y.o. male with PMH of persistent atrial fibrillation, OSA, Parkinson's disease and history of rheumatoid arthritis.  There was attempt in the past to cardiovert him, however he went back into atrial fibrillation shortly after the cardioversion.  Last echocardiogram on 07/27/2017 showed EF 55 to 60%, trivial mitral regurgitation, PA peak pressure 39 mmHg.  Myoview obtained on 04/12/2018 showed EF 59%, no ischemia.  Patient was started on flecainide therapy for rhythm control.   He presents today for further evaluation.  He says normally in atrial fibrillation he has some degree of fatigue.  Other than that he does not feel the palpitations nor does he have any shortness of breath.  He is tolerating the flecainide without significant side effect.  His QTC level is normal on today's EKG.  I plan to set the patient up for outpatient cardioversion next week as indicated in Dr. Wendy Poet last note.  I will defer to Dr. Agustin Cree after the cardioversion on when to obtain plain old treadmill test to rule out ventricular ectopy while on the flecainide.     Past Medical History:  Diagnosis Date  . Atrial fibrillation ([place])    onset 07/17/17  . Dysrhythmia   . ED (erectile dysfunction)   . GERD 07/01/2007  . Parkinson's disease ([place])   . 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 PROSTATE SPECIFIC ANTIGEN, ELEVATED 06/29/2007  . Rheumatoid arthritis ([place])   . SEIZURE DISORDER 07/01/2007   one epsiode only  . Spinal stenosis of lumbar region 09/22/2014    Past Surgical History:  Procedure Laterality Date  . AMPUTATION     right great toe - tramatic age 15  . CARDIOVERSION N/A 03/15/2018   Procedure:  CARDIOVERSION;  Surgeon: [name], MD;  Location: [facility] ENDOSCOPY;  Service: Cardiovascular;  Laterality: N/A;  . COLONOSCOPY    . DEBRIDEMENT TOE Right 07/2016  . HAND SURGERY  03/2017   removal cysts  . IR FLUORO GUIDE CV LINE RIGHT  10/16/2016  . IR US GUIDE VASC ACCESS RIGHT  10/16/2016  . OSTECTOMY Right 08/05/2017   Procedure: OSTECTOMY COMPLETE METARSAL HEAD FIRST RIGHT;  Surgeon: Trula Slade, DPM;  Location: [place];  Service: Podiatry;  Laterality: Right;    Current Medications: Outpatient Medications Prior to Visit  Medication Sig Dispense Refill  . carbidopa-levodopa (SINEMET IR) 25-100 MG tablet TAKE 1 TABLET BY MOUTH THREE TIMES A DAY 270 tablet 1  . ELIQUIS 5 MG TABS tablet TAKE 1 TABLET BY MOUTH TWICE A DAY 60 tablet 6  . flecainide (TAMBOCOR) 50 MG tablet TAKE 1 TABLET BY MOUTH TWICE A DAY 60 tablet 6  . ibuprofen (ADVIL,MOTRIN) 200 MG tablet Take 800 mg by mouth every 8 (eight) hours as needed (for pain/headaches.).    Marland Kitchen metoprolol succinate (TOPROL-XL) 50 MG 24 hr tablet TAKE 1 TABLET (50 MG TOTAL) BY MOUTH DAILY. TAKE WITH OR IMMEDIATELY FOLLOWING A MEAL. 90 tablet 0  . Multiple Vitamin (MULTIVITAMIN WITH MINERALS) TABS tablet Take 1 tablet by mouth daily. Centrum Silver    . NEUPRO 4 MG/24HR PLACE 1 PATCH ONTO THE SKIN DAILY. 30 patch 5  . Omega-3  Fatty Acids (FISH OIL) 1200 MG CPDR Take 1,200 mg by mouth daily.    Marland Kitchen omeprazole (PRILOSEC) 20 MG capsule Take 20 mg by mouth 2 (two) times daily before a meal.     . collagenase (SANTYL) ointment Apply 1 application topically daily. (Patient taking differently: Apply 1 application topically daily. Applied to foot with dressing change) 30 g 5  . diclofenac sodium (VOLTAREN) 1 % GEL Apply 2 g topically 4 (four) times daily. Rub into affected area of foot 2 to 4 times daily 100 g 2  . NONFORMULARY OR COMPOUNDED ITEM [facility]:  Antiinflammatory cream #15 - Diclofenac 3%, Lidocaine 5%, Menthol 1%, apply 1-2 grams  to affected area 3-4 times a day prn. 100 each 5  . pneumococcal 13-valent conjugate vaccine (PREVNAR 13) SUSP injection Prevnar 13 (PF) 0.5 mL intramuscular syringe     No facility-administered medications prior to visit.      Allergies:   Patient has no known allergies.   Social History   Socioeconomic History  . Marital status: Single    Spouse name: Not on file  . Number of children: Not on file  . Years of education: Not on file  . Highest education level: Not on file  Occupational History  . Occupation: Chartered certified accountant  Social Needs  . Financial resource strain: Not on file  . Food insecurity:    Worry: Not on file    Inability: Not on file  . Transportation needs:    Medical: Not on file    Non-medical: Not on file  Tobacco Use  . Smoking status: Light Tobacco Smoker    Types: Cigars  . Smokeless tobacco: Never Used  . Tobacco comment: 1 cigar, takes 3 weeks  Substance and Sexual Activity  . Alcohol use: Yes    Alcohol/week: 4.0 standard drinks    Types: 4 Standard drinks or equivalent per week    Comment: one drink a day (liquor)  . Drug use: No  . Sexual activity: Not on file  Lifestyle  . Physical activity:    Days per week: Not on file    Minutes per session: Not on file  . Stress: Not on file  Relationships  . Social connections:    Talks on phone: Not on file    Gets together: Not on file    Attends religious service: Not on file    Active member of club or organization: Not on file    Attends meetings of clubs or organizations: Not on file    Relationship status: Not on file  Other Topics Concern  . Not on file  Social History Narrative  . Not on file     Family History:  The patient's family history includes Alcoholism in his mother; Arthritis in his father and mother.   ROS:   Please see the history of present illness.    ROS All other systems reviewed and are negative.   PHYSICAL EXAM:   VS:  BP (!) 140/93   Pulse 82   Ht 5\' 11"   (1.803 m)   Wt 218 lb (98.9 kg)   BMI 30.40 kg/m    GEN: Well nourished, well developed, in no acute distress  HEENT: normal  Neck: no JVD, carotid bruits, or masses Cardiac: Irregularly irregular; no murmurs, rubs, or gallops,no edema  Respiratory:  clear to auscultation bilaterally, normal work of breathing GI: soft, nontender, nondistended, + BS MS: no deformity or atrophy  Skin: warm and dry, no rash  Neuro:  Alert and Oriented x 3, Strength and sensation are intact Psych: euthymic mood, full affect  Wt Readings from Last 3 Encounters:  07/08/18 218 lb (98.9 kg)  06/04/18 217 lb 1.9 oz (98.5 kg)  05/31/18 216 lb 1.9 oz (98 kg)      Studies/Labs Reviewed:   EKG:  EKG is ordered today.  The ekg ordered today demonstrates atrial fibrillation  Recent Labs: 08/05/2017: Magnesium 1.9 03/08/2018: BUN 21; Creatinine, Ser 0.87; Hemoglobin 13.6; Platelets 256; Potassium 4.2; Sodium 138   Lipid Panel    Component Value Date/Time   CHOL 168 02/26/2017 0922   TRIG 55.0 02/26/2017 0922   HDL 52.90 02/26/2017 0922   CHOLHDL 3 02/26/2017 0922   VLDL 11.0 02/26/2017 0922   LDLCALC 104 (H) 02/26/2017 0922    Additional studies/ records that were reviewed today include:   Echo 07/27/2017 LV EF: 55% -   60% Study Conclusions  - Left ventricle: The cavity size was normal. Wall thickness was   increased in a pattern of moderate LVH. Indeterminant diastolic   function (atrial fibrillation). Systolic function was normal. The   estimated ejection fraction was in the range of 55% to 60%. Wall   motion was normal; there were no regional wall motion   abnormalities. - Aortic valve: There was no stenosis. - Mitral valve: There was trivial regurgitation. - Left atrium: The atrium was moderately dilated. - Right ventricle: The cavity size was normal. Systolic function   was normal. - Right atrium: The atrium was mildly dilated. - Tricuspid valve: Peak RV-RA gradient (S): 31 mm Hg. -  Pulmonary arteries: PA peak pressure: 39 mm Hg (S). - Systemic veins: IVC measured 2.2 with > 50% respirophasic   variation, suggesting RA pressure 8 mmHg.  Impressions:  - The patient was in atrial fibrillation. Normal LV size with   moderate LV hypertrophy. EF 55-60%. Normal RV size and systolic   function. No significant valvular abnormalities. Mild pulmonary   hypertension.   Myoview 04/12/2018 Study Highlights     Nuclear stress EF: 59%.  No T wave inversion was noted during stress.  There was no ST segment deviation noted during stress.  The study is normal.  This is a low risk study.   Normal perfusion. LVEF 59% with normal regional wall motion. No prior study for comparison. This is a low risk study.      ASSESSMENT:    1. Persistent atrial fibrillation   2. OSA (obstructive sleep apnea)   3. Parkinson disease ([place])      PLAN:  In order of problems listed above:  1. Persistent atrial fibrillation: Plan for outpatient DC cardioversion.  Risk and benefit of the procedure has been discussed with the patient.  1 in 10,000 risk associated with sedation including bradycardia, hypotension and aspiration.  He will need a plain old treadmill test afterward to assess for ventricular arrhythmia on flecainide he has been compliant with Eliquis  2. Parkinson's disease: Followed by neurologist    Medication Adjustments/Labs and Tests Ordered: Current medicines are reviewed at length with the patient today.  Concerns regarding medicines are outlined above.  Medication changes, Labs and Tests ordered today are listed in the Patient Instructions below. Patient Instructions  Medication Instructions:  Your physician recommends that you continue on your current medications as directed. Please refer to the Current Medication list given to you today.  If you need a refill on your cardiac medications before your next appointment, please call your  pharmacy.   Lab  work: CBC BMET  YOU WILL NEED TO HAVE YOUR BLOOD DRAWN ON July 23, 2018 FOR YOUR CARDIOVERSION   If you have labs (blood work) drawn today and your tests are completely normal, you will receive your results only by: Marland Kitchen MyChart Message (if you have MyChart) OR . A paper copy in the mail If you have any lab test that is abnormal or we need to change your treatment, we will call you to review the results.  Testing/Procedures: CARDIOVERSION is scheduled for July 26, 2018 at 8 AM Your physician has recommended that you have a Cardioversion (DCCV). Electrical Cardioversion uses a jolt of electricity to your heart either through paddles or wired patches attached to your chest. This is a controlled, usually prescheduled, procedure. Defibrillation is done under light anesthesia in the hospital, and you usually go home the day of the procedure. This is done to get your heart back into a normal rhythm. You are not awake for the procedure. Please see the instruction sheet given to you today.   Follow-Up: Your physician recommends that you schedule a follow-up appointment AFTER YOUR CARDIOVERSION WITH Jenne Campus, MD   Any Other Special Instructions Will Be Listed Below (If Applicable). NONE  CARDIOVERSION INSTRUCTIONS   Dear [name],  You are scheduled for a Cardioversion on July 26, 2018 with Dr. Johnsie Cancel.  Please arrive at the [facility] (Main Entrance A) at [facility]: [address] [place], [place] [address] at 8 am.   DIET: Nothing to eat or drink after midnight except a sip of water with medications (see medication instructions below)  Medication Instructions:   Continue your anticoagulant: ELIQUIS You will need to continue your anticoagulant after your procedure until you are told by your  Provider that it is safe to stop   You must have a responsible person to drive you home and stay in the waiting area during your procedure. Failure to do so could result in  cancellation.  Bring your insurance cards.  *Special Note: Every effort is made to have your procedure done on time. Occasionally there are emergencies that occur at the hospital that may cause delays. Please be patient if a delay does occur.       Hilbert Corrigan, [place]  07/10/2018 11:10 PM    [facility] [place], [place], [place]  [address] Phone: [phone]; Fax: [phone]

## 2018-07-09 ENCOUNTER — Other Ambulatory Visit: Payer: Self-pay | Admitting: Podiatry

## 2018-07-09 ENCOUNTER — Ambulatory Visit: Payer: Medicare HMO

## 2018-07-09 ENCOUNTER — Ambulatory Visit (INDEPENDENT_AMBULATORY_CARE_PROVIDER_SITE_OTHER): Payer: Medicare HMO

## 2018-07-09 ENCOUNTER — Ambulatory Visit (INDEPENDENT_AMBULATORY_CARE_PROVIDER_SITE_OTHER): Payer: Medicare HMO | Admitting: Podiatry

## 2018-07-09 DIAGNOSIS — L97511 Non-pressure chronic ulcer of other part of right foot limited to breakdown of skin: Secondary | ICD-10-CM

## 2018-07-09 DIAGNOSIS — M7989 Other specified soft tissue disorders: Secondary | ICD-10-CM

## 2018-07-09 DIAGNOSIS — M7752 Other enthesopathy of left foot: Secondary | ICD-10-CM

## 2018-07-09 DIAGNOSIS — M79672 Pain in left foot: Secondary | ICD-10-CM

## 2018-07-09 DIAGNOSIS — T8130XA Disruption of wound, unspecified, initial encounter: Secondary | ICD-10-CM

## 2018-07-10 ENCOUNTER — Other Ambulatory Visit: Payer: Self-pay | Admitting: Family Medicine

## 2018-07-10 ENCOUNTER — Encounter: Payer: Self-pay | Admitting: Physician Assistant

## 2018-07-12 ENCOUNTER — Telehealth: Payer: Self-pay | Admitting: *Deleted

## 2018-07-12 DIAGNOSIS — L97511 Non-pressure chronic ulcer of other part of right foot limited to breakdown of skin: Secondary | ICD-10-CM

## 2018-07-12 DIAGNOSIS — R2242 Localized swelling, mass and lump, left lower limb: Secondary | ICD-10-CM

## 2018-07-12 DIAGNOSIS — M7989 Other specified soft tissue disorders: Secondary | ICD-10-CM

## 2018-07-12 NOTE — Telephone Encounter (Signed)
Orders to J. Quintana, RN for pre-cert, faxed to [facility]. 

## 2018-07-12 NOTE — Telephone Encounter (Signed)
-----   Message from Trula Slade, DPM sent at 07/12/2018  7:26 AM EDT ----- Can you please order an MRI of the left foot to evaluate the soft tissue mass? Thanks.

## 2018-07-12 NOTE — Progress Notes (Signed)
Subjective: Samuel Moran is a 74 y.o. is seen today in office today for follow-up evaluation of a wound on the right foot as well as for soft tissue mass in the left foot.  He states the right foot is been doing well and they have been keeping medihoney on to the wound daily.  He feels the wound is almost completely healed.  He denies any drainage or pus or any swelling to the right foot.  On the left foot he feels that the soft tissue mass is getting softer but does continue. Denies any fevers, chills, nausea, vomiting.  No calf pain, chest pain, shortness of breath.  Objective: General: No acute distress, AAOx3 -presents today wearing a regular shoe. DP/PT pulses palpable 2/4, CRT < 3 sec to all digits.  RIGHT foot: Wound on the plantar aspect of the foot today has a hyperkeratotic tissue overlying the area.  Upon debridement appears that the wound is almost completely healed at this time.  There is no surrounding erythema, ascending cellulitis.  Is no fluctuation or crepitation or any malodor.  There is no probing, undermining or tunneling. LEFT FOOT: Almost a fatty type mass is present to the fifth metatarsal base on the left foot today.  Soft tissue masses appear to be softer and smaller than it did last appointment.  No pain today. No pain with calf compression, swelling, warmth, erythema.   Assessment and Plan:  Chronic ulcer right foot, with improvement; prominent fifth metatarsal bases; tissue mass left foot  -Treatment options discussed including all alternatives, risks, and complications -X-rays were obtained and reviewed.  Significant arthritic changes are present in the midfoot.  No evidence of acute fracture or cortical destruction. -Today instructed debrided the wound treatment all hyperkeratotic tissue nonviable tissue utilizing a 312 with scalpel on the right foot.  We can continue with daily dressing changes with Medihoney.  However the wound appears to be also completely healed  at this time.  Continue offloading. -Reviewed the ultrasound the left foot.  I will order an MRI given radiologist recommendation.  Clinically appears to be doing better.  Trula Slade DPM

## 2018-07-13 ENCOUNTER — Other Ambulatory Visit: Payer: Self-pay | Admitting: Physician Assistant

## 2018-07-15 ENCOUNTER — Ambulatory Visit (INDEPENDENT_AMBULATORY_CARE_PROVIDER_SITE_OTHER): Payer: Medicare HMO | Admitting: Cardiology

## 2018-07-15 ENCOUNTER — Other Ambulatory Visit: Payer: Self-pay

## 2018-07-15 DIAGNOSIS — I10 Essential (primary) hypertension: Secondary | ICD-10-CM | POA: Diagnosis not present

## 2018-07-15 DIAGNOSIS — G4733 Obstructive sleep apnea (adult) (pediatric): Secondary | ICD-10-CM

## 2018-07-15 DIAGNOSIS — G20 Parkinson's disease: Secondary | ICD-10-CM | POA: Diagnosis not present

## 2018-07-15 DIAGNOSIS — I4819 Other persistent atrial fibrillation: Secondary | ICD-10-CM

## 2018-07-15 DIAGNOSIS — E785 Hyperlipidemia, unspecified: Secondary | ICD-10-CM

## 2018-07-15 NOTE — Progress Notes (Signed)
Cardiology Office Note:    Date:  07/15/2018   ID:  Samuel Moran, DOB [birth year], MRN [id number]  PCP:  [name], MD  Cardiologist:  Jenne Campus, MD    Referring MD: [name], MD   No chief complaint on file. Doing well  History of Present Illness:    Samuel Moran is a 73 y.o. male persistent atrial fibrillation.  He is already scheduled to have cardioversion.  We discussed again the procedure and I told him what are the risk of it also I told him that sometimes simply does not work but hopefully in his case with flecainide on board will succeed in getting back to normal rhythm and keep in normal rhythm overall he seems to be doing well he goes to gym on the regular basis trying to be active denies have any chest pain tightness squeezing pressure burning chest palpitations.  Past Medical History:  Diagnosis Date  . Atrial fibrillation ([place])    onset 07/17/17  . Dysrhythmia   . ED (erectile dysfunction)   . GERD 07/01/2007  . Parkinson's disease ([place])   . 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 PROSTATE SPECIFIC ANTIGEN, ELEVATED 06/29/2007  . Rheumatoid arthritis ([place])   . SEIZURE DISORDER 07/01/2007   one epsiode only  . Spinal stenosis of lumbar region 09/22/2014    Past Surgical History:  Procedure Laterality Date  . AMPUTATION     right great toe - tramatic age 44  . CARDIOVERSION N/A 03/15/2018   Procedure: CARDIOVERSION;  Surgeon: [name], MD;  Location: [facility] ENDOSCOPY;  Service: Cardiovascular;  Laterality: N/A;  . COLONOSCOPY    . DEBRIDEMENT TOE Right 07/2016  . HAND SURGERY  03/2017   removal cysts  . IR FLUORO GUIDE CV LINE RIGHT  10/16/2016  . IR US GUIDE VASC ACCESS RIGHT  10/16/2016  . OSTECTOMY Right 08/05/2017   Procedure: OSTECTOMY COMPLETE METARSAL HEAD FIRST RIGHT;  Surgeon: Trula Slade, DPM;  Location: [place];  Service: Podiatry;  Laterality: Right;    Current Medications: No outpatient medications have been  marked as taking for the 07/15/18 encounter (Appointment) with Park Liter, MD.     Allergies:   Patient has no known allergies.   Social History   Socioeconomic History  . Marital status: Single    Spouse name: Not on file  . Number of children: Not on file  . Years of education: Not on file  . Highest education level: Not on file  Occupational History  . Occupation: Chartered certified accountant  Social Needs  . Financial resource strain: Not on file  . Food insecurity:    Worry: Not on file    Inability: Not on file  . Transportation needs:    Medical: Not on file    Non-medical: Not on file  Tobacco Use  . Smoking status: Light Tobacco Smoker    Types: Cigars  . Smokeless tobacco: Never Used  . Tobacco comment: 1 cigar, takes 3 weeks  Substance and Sexual Activity  . Alcohol use: Yes    Alcohol/week: 4.0 standard drinks    Types: 4 Standard drinks or equivalent per week    Comment: one drink a day (liquor)  . Drug use: No  . Sexual activity: Not on file  Lifestyle  . Physical activity:    Days per week: Not on file    Minutes per session: Not on file  . Stress: Not on file  Relationships  . Social connections:  Talks on phone: Not on file    Gets together: Not on file    Attends religious service: Not on file    Active member of club or organization: Not on file    Attends meetings of clubs or organizations: Not on file    Relationship status: Not on file  Other Topics Concern  . Not on file  Social History Narrative  . Not on file     Family History: The patient's family history includes Alcoholism in his mother; Arthritis in his father and mother. There is no history of Colon cancer, Esophageal cancer, Rectal cancer, or Stomach cancer. ROS:   Please see the history of present illness.    All 14 point review of systems negative except as described per history of present illness  EKGs/Labs/Other Studies Reviewed:      Recent Labs: 08/05/2017: Magnesium 1.9  03/08/2018: BUN 21; Creatinine, Ser 0.87; Hemoglobin 13.6; Platelets 256; Potassium 4.2; Sodium 138  Recent Lipid Panel    Component Value Date/Time   CHOL 168 02/26/2017 0922   TRIG 55.0 02/26/2017 0922   HDL 52.90 02/26/2017 0922   CHOLHDL 3 02/26/2017 0922   VLDL 11.0 02/26/2017 0922   LDLCALC 104 (H) 02/26/2017 0922    Physical Exam:    VS:  There were no vitals taken for this visit.    Wt Readings from Last 3 Encounters:  07/08/18 218 lb (98.9 kg)  06/04/18 217 lb 1.9 oz (98.5 kg)  05/31/18 216 lb 1.9 oz (98 kg)     GEN:  Well nourished, well developed in no acute distress HEENT: Normal NECK: No JVD; No carotid bruits LYMPHATICS: No lymphadenopathy CARDIAC: Irregularly irregular, no murmurs, no rubs, no gallops RESPIRATORY:  Clear to auscultation without rales, wheezing or rhonchi  ABDOMEN: Soft, non-tender, non-distended MUSCULOSKELETAL:  No edema; No deformity  SKIN: Warm and dry LOWER EXTREMITIES: no swelling NEUROLOGIC:  Alert and oriented x 3 PSYCHIATRIC:  Normal affect   ASSESSMENT:    1. Persistent atrial fibrillation   2. Essential hypertension   3. Obstructive sleep apnea   4. Dyslipidemia   5. Parkinson disease ([place])    PLAN:    In order of problems listed above:  1. Persistent atrial fibrillation plan is to cardiovert him as he scheduled next week.  He stays on anticoagulation I stressed importance of taking this medication on the regular basis without skipping. 2. Central hypertension blood pressure well controlled continue present medications. 3. Dyslipidemia continue statin 4. Concerns disease.  Noted    Medication Adjustments/Labs and Tests Ordered: Current medicines are reviewed at length with the patient today.  Concerns regarding medicines are outlined above.  No orders of the defined types were placed in this encounter.  Medication changes: No orders of the defined types were placed in this encounter.   Signed, Park Liter,  MD, [facility] 07/15/2018 11:59 AM    [place]

## 2018-07-16 ENCOUNTER — Encounter: Payer: Self-pay | Admitting: Cardiology

## 2018-07-19 NOTE — Addendum Note (Signed)
Addended by: Aleatha Borer on: 07/19/2018 03:36 PM   Modules accepted: Orders

## 2018-07-22 DIAGNOSIS — I4819 Other persistent atrial fibrillation: Secondary | ICD-10-CM | POA: Diagnosis not present

## 2018-07-22 LAB — BASIC METABOLIC PANEL
BUN / CREAT RATIO: 22 (ref 10–24)
BUN: 19 mg/dL (ref 8–27)
CHLORIDE: 101 mmol/L (ref 96–106)
CO2: 21 mmol/L (ref 20–29)
CREATININE: 0.88 mg/dL (ref 0.76–1.27)
Calcium: 9 mg/dL (ref 8.6–10.2)
GFR calc Af Amer: 101 mL/min/{1.73_m2} (ref >59)
GFR calc non Af Amer: 87 mL/min/{1.73_m2} (ref >59)
GLUCOSE: 84 mg/dL (ref 65–99)
POTASSIUM: 4.5 mmol/L (ref 3.5–5.2)
SODIUM: 137 mmol/L (ref 134–144)

## 2018-07-22 LAB — CBC
Hematocrit: 42 % (ref 37.5–51.0)
Hemoglobin: 14.1 g/dL (ref 13.0–17.7)
MCH: 30.5 pg (ref 26.6–33.0)
MCHC: 33.6 g/dL (ref 31.5–35.7)
MCV: 91 fL (ref 79–97)
PLATELETS: 237 10*3/uL (ref 150–450)
RBC: 4.62 x10E6/uL (ref 4.14–5.80)
RDW: 13.5 % (ref 11.6–15.4)
WBC: 7.2 10*3/uL (ref 3.4–10.8)

## 2018-07-22 NOTE — Progress Notes (Signed)
Normal red blood cell count

## 2018-07-23 ENCOUNTER — Telehealth: Payer: Self-pay | Admitting: Cardiology

## 2018-07-23 NOTE — Telephone Encounter (Signed)
Called patient to inform elective procedures systemwide will now be cancelled. In regards to patient symptoms his HR is controlled per his report, mostly tired. No chest pain or shortness of breath. Instructed best option would be to cancel DCCV at this time 2/2 to Covid-19. This was scheduled for 07/26/2018 @8am . Patient understood and agreeable with this plan. Instructed to call the office with changes in symptoms.   Reino Bellis NP

## 2018-07-26 ENCOUNTER — Ambulatory Visit (HOSPITAL_COMMUNITY): Admission: RE | Admit: 2018-07-26 | Payer: Medicare HMO | Source: Home / Self Care | Admitting: Cardiovascular Disease

## 2018-07-26 ENCOUNTER — Encounter (HOSPITAL_COMMUNITY): Admission: RE | Payer: Self-pay | Source: Home / Self Care

## 2018-07-26 SURGERY — CARDIOVERSION
Anesthesia: General

## 2018-07-26 NOTE — Progress Notes (Signed)
The patient has been notified of the result and verbalized understanding.  All questions (if any) were answered. Jacqulynn Cadet, [place] 07/26/2018 2:52 PM

## 2018-07-30 ENCOUNTER — Ambulatory Visit: Payer: Medicare HMO | Admitting: Podiatry

## 2018-07-30 ENCOUNTER — Other Ambulatory Visit: Payer: Self-pay

## 2018-07-30 ENCOUNTER — Encounter: Payer: Self-pay | Admitting: Podiatry

## 2018-07-30 VITALS — Temp 98.1°F

## 2018-07-30 DIAGNOSIS — L97511 Non-pressure chronic ulcer of other part of right foot limited to breakdown of skin: Secondary | ICD-10-CM

## 2018-07-30 NOTE — Progress Notes (Signed)
Subjective: Samuel Moran is a 73 y.o. is seen today in office today for follow-up evaluation of a wound on the right foot as well as for soft tissue mass in the left foot.  He states that overall he is doing well.  He still keeping a bandage on the right foot but he feels the area may be healed.  Denies any drainage or pus from the area denies any swelling or redness.  He has not been able to get his MRI of the left foot given the recent health guidelines.  He has no other concerns today. Denies any fevers, chills, nausea, vomiting.  No calf pain, chest pain, shortness of breath.  Objective: General: No acute distress, AAOx3 -presents today wearing a regular shoe. DP/PT pulses palpable 2/4, CRT < 3 sec to all digits.  RIGHT foot: Wound on the plantar aspect of the foot today has a hyperkeratotic tissue overlying the area.  Upon debridement appears that the wound is almost completely healed at this time how there is a pinpoint type lesion still present.  There is no surrounding erythema, ascending cellulitis.  No fluctuation crepitation any malodor. Overall on bilateral feet with metatarsal base specific fatty type lesion present.  The area of the soft tissue mass on the left foot is almost resolved and back to how it was previously.  No erythema or warmth no pain today. No pain with calf compression, swelling, warmth, erythema.   Assessment and Plan:  Chronic ulcer right foot, with improvement; prominent fifth metatarsal bases; tissue mass left foot  -Treatment options discussed including all alternatives, risks, and complications -Today instructed debrided the wound treatment all hyperkeratotic tissue nonviable tissue utilizing a 312 with scalpel on the right foot.  We can continue with daily dressing changes with Medihoney.  However the wound appears to be also completely healed at this time.  Continue offloading. Once healed can switch to moisturizer daily.  -Awaiting MRI  Trula Slade  DPM

## 2018-08-07 ENCOUNTER — Other Ambulatory Visit: Payer: Self-pay | Admitting: Family Medicine

## 2018-08-16 DIAGNOSIS — M25551 Pain in right hip: Secondary | ICD-10-CM | POA: Diagnosis not present

## 2018-08-20 NOTE — Progress Notes (Signed)
Virtual Visit via Video Note The purpose of this virtual visit is to provide medical care while limiting exposure to the novel coronavirus.    Consent was obtained for video visit:  Yes.   Answered questions that patient had about telehealth interaction:  Yes.   I discussed the limitations, risks, security and privacy concerns of performing an evaluation and management service by telemedicine. I also discussed with the patient that there may be a patient responsible charge related to this service. The patient expressed understanding and agreed to proceed.  Pt location: Home Physician Location: office Name of referring provider:  [name], MD I connected with Samuel Moran at patients initiation/request on 08/23/2018 at  2:00 PM EDT by video enabled telemedicine application and verified that I am speaking with the correct person using two identifiers. Pt MRN:  [id number] Pt DOB:  May 28, 1945 Video Participants:  Samuel Moran;     History of Present Illness:  Patient is seen today in follow-up for Parkinson's disease.  He is on rotigotine patch, 4 mg daily and carbidopa/levodopa 25/100, 1 tablet 3 times per day.  Last visit, he was asked to move those dosages closer together, as he was taking the last dose at bedtime.  He called me in January to state that he thought he was doing worse, but still was not moving the dosages closer together and was still taking the last at bedtime and again we advised him to move the dosages closer together.  Today, he states that he is still taking the last one at 8pm (with bedtime at 9:30pm).  Records from his other providers have been reviewed since our last visit, including cardiology records regarding his persistent A. Fib.  He was scheduled for cardioversion but had to be cx due to covid.  Pt denies falls but has some trouble getting up after bending down.  Pt denies lightheadedness, near syncope.  No hallucinations but has occasional visual  distoritions.  Mood has been good.  He does describe some start hesitation.   Observations/Objective:   There were no vitals filed for this visit. GEN:  The patient appears stated age and is in NAD.  Neurological examination:  Orientation: The patient is alert and oriented x3. Cranial nerves: There is good facial symmetry. There is facial hypomimia.  The speech is fluent and clear. Soft palate rises symmetrically and there is no tongue deviation. Hearing is intact to conversational tone. Motor: Strength is at least antigravity x 4.   Shoulder shrug is equal and symmetric.  There is no pronator drift.  Movement examination: Tone: unable Abnormal movements: There is right upper extremity tremor noted with ambulation (last taken levodopa at 8:30 AM and examined at 2:30 PM). Coordination:  There is no decremation with RAM's, with hand opening and closing, finger taps or the action of "turning in a light bulb." Gait and Station: The patient has no difficulty arising out of a deep-seated chair without the use of the hands. The patient's stride length is good, but he has right upper extremity tremor with ambulation.      Assessment and Plan:   1.  Idiopathic Parkinson's disease             -We discussed the diagnosis as well as pathophysiology of the disease.  We discussed treatment options as well as prognostic indicators.  Patient education was provided.             -continue neupro, 4 mg daily.  Discussed  r/b/se of the medication extensively             -Continue with carbidopa/levodopa 25/100, 1 tablet three times per day.  As with the prior several visits and phone calls, we discussed moving his dosages of levodopa closer together so that he is taking them every 4 hours.  He is currently taking them about every 6 hours, with the last dosed very close to bedtime.  I think if he would move him closer together he would find that he clinically does better.             -Discussed in detail Covid-19  and risk factors for this, including age and PD.  Discussed importance of social distancing.  Discussed importance of staying home at all times, as is feasible.  Discussed taking advantage of grocery store hours for the elderly.  Pt expressed understanding.  -Discussed increasing cardiovascular activity, if able by cardiology.  2.  Rheumatoid arthritis             -seeing Dr. [name]. Feels that doing well in that regard.  3.  A-fib             -awaiting cardioversion when able to do elective procedures again  4.  Obstructive sleep apnea syndrome.             -Sleep study in August, 2019 demonstrated mild sleep apnea (AHI 7.1).  This to recommended CPAP given cardiovascular comorbidities. He is awaiting a CPAP titration  Follow Up Instructions:    -I discussed the assessment and treatment plan with the patient. The patient was provided an opportunity to ask questions and all were answered. The patient agreed with the plan and demonstrated an understanding of the instructions.   The patient was advised to call back or seek an in-person evaluation if the symptoms worsen or if the condition fails to improve as anticipated.    Total Time spent in visit with the patient was:  15 min, of which more than 50% of the time was spent in counseling and/or coordinating care on safety (this didn't include the additional 18 min trying to get patient onto the platform).   Pt understands and agrees with the plan of care outlined.     Alonza Bogus, DO

## 2018-08-23 ENCOUNTER — Encounter: Payer: Self-pay | Admitting: *Deleted

## 2018-08-23 ENCOUNTER — Encounter: Payer: Self-pay | Admitting: Neurology

## 2018-08-23 ENCOUNTER — Other Ambulatory Visit: Payer: Self-pay

## 2018-08-23 ENCOUNTER — Telehealth (INDEPENDENT_AMBULATORY_CARE_PROVIDER_SITE_OTHER): Payer: Medicare HMO | Admitting: Neurology

## 2018-08-23 DIAGNOSIS — G20 Parkinson's disease: Secondary | ICD-10-CM

## 2018-08-26 ENCOUNTER — Other Ambulatory Visit: Payer: Self-pay

## 2018-08-26 ENCOUNTER — Encounter: Payer: Self-pay | Admitting: Podiatry

## 2018-08-26 ENCOUNTER — Ambulatory Visit: Payer: Medicare HMO | Admitting: Podiatry

## 2018-08-26 VITALS — Temp 97.7°F

## 2018-08-26 DIAGNOSIS — M7989 Other specified soft tissue disorders: Secondary | ICD-10-CM | POA: Diagnosis not present

## 2018-08-26 DIAGNOSIS — L97511 Non-pressure chronic ulcer of other part of right foot limited to breakdown of skin: Secondary | ICD-10-CM | POA: Diagnosis not present

## 2018-08-26 MED ORDER — DICLOFENAC SODIUM 1 % TD GEL: 2.0000 g | Freq: Four times a day (QID) | TRANSDERMAL | 2 refills | Status: DC

## 2018-08-27 ENCOUNTER — Ambulatory Visit: Payer: Medicare HMO | Admitting: Podiatry

## 2018-08-30 NOTE — Progress Notes (Signed)
Subjective: Samuel Moran is a 73 y.o. is seen today in office today for follow-up evaluation of a wound on the right foot as well as for soft tissue mass in the left foot. He has continued with the medihoney on the wound to the right foot daily.  He denies any drainage or pus coming from the area no swelling or redness.  He also states the left foot is been doing well.  He has not been able to schedule the MRI given the recent coronavirus pandemic.  No pain to bilateral feet today. Denies any fevers, chills, nausea, vomiting.  No calf pain, chest pain, shortness of breath.  Objective: General: No acute distress, AAOx3 -presents today wearing a regular shoe. DP/PT pulses palpable 2/4, CRT < 3 sec to all digits.  RIGHT foot: There is a hyperkeratotic lesion overlying the area of the wound.  On debridement no ulceration has healed there is no definitive ulcer identified today and there is no drainage or pus.  No facial swelling there is no erythema.  No ascending cellulitis.  Is no fluctuance or crepitation malodor.  Dorsal prominence of metatarsal head area plantarly.  There is symmetrical soft tissue mass/bony prominence present fifth metatarsal bases bilaterally.  There is no pain in the area today.  No fluctuation crepitation.  No erythema.  No pain. No pain with calf compression, swelling, warmth, erythema.   Assessment and Plan:  Healed ulcer right foot; prominent fifth metatarsal bases; tissue mass left foot  -Treatment options discussed including all alternatives, risks, and complications -I debrided the hyperkeratotic lesion on the right foot without any complications or bleeding.  Wound appears to be healed.  Continue moisturizer daily offloading at all times. -Awaiting MRI of the left foot. -Continue with supportive shoes and offloading at all times.  Encouraged to monitor his feet daily for any skin breakdown or any new areas of concerns and to let me know immediately if there are any  changes.  Return in about 2 months (around 10/26/2018).  Trula Slade DPM

## 2018-09-01 ENCOUNTER — Ambulatory Visit: Payer: Medicare HMO | Admitting: Neurology

## 2018-09-01 ENCOUNTER — Encounter

## 2018-09-02 ENCOUNTER — Other Ambulatory Visit: Payer: Self-pay

## 2018-09-02 ENCOUNTER — Ambulatory Visit (INDEPENDENT_AMBULATORY_CARE_PROVIDER_SITE_OTHER): Payer: Medicare HMO | Admitting: Podiatry

## 2018-09-02 DIAGNOSIS — L601 Onycholysis: Secondary | ICD-10-CM | POA: Diagnosis not present

## 2018-09-02 NOTE — Patient Instructions (Signed)

## 2018-09-02 NOTE — Progress Notes (Signed)
Subjective: 73 year old male presents the office today for concerns of right fifth toenail pain.  He states that the nail has become loose when he states that the nail hurts quite a bit the nail started to come off.  Denies any drainage or pus coming from the area.  He said no recent treatment. Denies any systemic complaints such as fevers, chills, nausea, vomiting. No acute changes since last appointment, and no other complaints at this time.   Objective: AAO x3, NAD DP/PT pulses palpable bilaterally, CRT less than 3 seconds The right fifth toenail is very loose and only attached on the proximal lateral corner.  The nail was easily removed in total.  There is no drainage or pus or signs of an abscess.  There is a faint rim of erythema more from inflammation as opposed to infection.  No ascending cellulitis.  No fluctuation crepitation any malodor. No open lesions or pre-ulcerative lesions.  No pain with calf compression, swelling, warmth, erythema  Assessment: Right fifth toenail Onycholysis  Plan: -All treatment options discussed with the patient including all alternatives, risks, complications.  -Today was easily able to remove the right fifth toenail but no complications or bleeding.  Recommend antibiotic ointment dressing changes daily.  Also discussed Epson salt soaks.  Discussed with him to make sure that someone else check the temperature the water to avoid any frostbite or burn. -Patient encouraged to call the office with any questions, concerns, change in symptoms.   RTC as scheduled or sooner if needed  Trula Slade DPM

## 2018-09-03 ENCOUNTER — Other Ambulatory Visit: Payer: Self-pay | Admitting: Family Medicine

## 2018-09-19 ENCOUNTER — Other Ambulatory Visit: Payer: Self-pay | Admitting: Cardiology

## 2018-09-21 NOTE — Telephone Encounter (Signed)
We can do Rx for him

## 2018-09-21 NOTE — Telephone Encounter (Signed)
Do you want to take over this medication or should the patient's PCP continue to fill it?

## 2018-09-23 DIAGNOSIS — M545 Low back pain: Secondary | ICD-10-CM | POA: Diagnosis not present

## 2018-09-23 DIAGNOSIS — M25551 Pain in right hip: Secondary | ICD-10-CM | POA: Diagnosis not present

## 2018-10-13 ENCOUNTER — Other Ambulatory Visit: Payer: Self-pay | Admitting: Cardiology

## 2018-10-14 DIAGNOSIS — Z79899 Other long term (current) drug therapy: Secondary | ICD-10-CM | POA: Diagnosis not present

## 2018-10-14 DIAGNOSIS — M0579 Rheumatoid arthritis with rheumatoid factor of multiple sites without organ or systems involvement: Secondary | ICD-10-CM | POA: Diagnosis not present

## 2018-10-14 DIAGNOSIS — M545 Low back pain: Secondary | ICD-10-CM | POA: Diagnosis not present

## 2018-10-14 DIAGNOSIS — G20 Parkinson's disease: Secondary | ICD-10-CM | POA: Diagnosis not present

## 2018-10-14 DIAGNOSIS — L089 Local infection of the skin and subcutaneous tissue, unspecified: Secondary | ICD-10-CM | POA: Diagnosis not present

## 2018-10-14 DIAGNOSIS — M2550 Pain in unspecified joint: Secondary | ICD-10-CM | POA: Diagnosis not present

## 2018-10-21 ENCOUNTER — Other Ambulatory Visit: Payer: Self-pay | Admitting: Neurology

## 2018-10-21 NOTE — Telephone Encounter (Signed)
Requested Prescriptions   Pending Prescriptions Disp Refills  . carbidopa-levodopa (SINEMET IR) 25-100 MG tablet [Pharmacy Med Name: CARBIDOPA-LEVODOPA 25-100 TAB] 270 tablet 1    Sig: TAKE 1 TABLET BY MOUTH THREE TIMES A DAY   Rx last filled:04/26/18 #270 1 refills  Pt last seen:08/23/18  Follow up appt scheduled:01/25/19

## 2018-10-28 ENCOUNTER — Ambulatory Visit: Payer: Medicare HMO | Admitting: Podiatry

## 2018-10-28 ENCOUNTER — Other Ambulatory Visit: Payer: Self-pay

## 2018-10-28 DIAGNOSIS — M7989 Other specified soft tissue disorders: Secondary | ICD-10-CM | POA: Diagnosis not present

## 2018-10-28 DIAGNOSIS — L84 Corns and callosities: Secondary | ICD-10-CM | POA: Diagnosis not present

## 2018-10-28 NOTE — Progress Notes (Signed)
Subjective: 73 year old male presents the office today for concerns of a callus involving the foot.  He states he is doing well otherwise and he has not had any redness or drainage or any swelling.  He states that the fluid is back on the left foot as well.  He was never able get the MRI due to the pandemic.  He has no pain associated with swelling on the left foot.  No redness or warmth. Denies any systemic complaints such as fevers, chills, nausea, vomiting. No acute changes since last appointment, and no other complaints at this time.   Objective: AAO x3, NAD DP/PT pulses palpable bilaterally, CRT less than 3 seconds Callus formation on the right foot plantarly over the area the previous incision.  Upon debridement there is no ongoing ulceration, drainage or any signs of infection.  On the left foot worse than the right there is localized edema the third metatarsal base consistent with a large bursa.  There is no erythema or warmth.  No open lesions or pre-ulcerative lesions.  No pain with calf compression, swelling, warmth, erythema  Assessment: Healed wound with hyperkeratotic lesion right foot, left soft tissue mass  Plan: -All treatment options discussed with the patient including all alternatives, risks, complications.  -Debrided hyperkeratotic lesion to the plantar complications.  Moisturizer daily. -Regards to the soft tissue mass is offloading.  I do believe a lot of this is due to biomechanical changes however we will get the MRI to rule out any other soft tissue mass.  This was reordered today. Sent to [employer] [facility] for pre-cert.  -Patient encouraged to call the office with any questions, concerns, change in symptoms.   Trula Slade DPM

## 2018-11-07 ENCOUNTER — Other Ambulatory Visit: Payer: Self-pay | Admitting: Cardiology

## 2018-11-10 ENCOUNTER — Telehealth: Payer: Self-pay | Admitting: *Deleted

## 2018-11-10 NOTE — Telephone Encounter (Addendum)
[employer] authorized 07/2018 MRI and authorization faxed to [place].

## 2018-11-22 ENCOUNTER — Telehealth: Payer: Self-pay | Admitting: Cardiology

## 2018-11-22 NOTE — Telephone Encounter (Signed)
We need to bring him to the office for EKG and will decide

## 2018-11-22 NOTE — Telephone Encounter (Signed)
Cardioversion was canceled in March due to [place] and was never rescheduled. I will consult with Dr. Agustin Cree and follow up with patient.

## 2018-11-22 NOTE — Telephone Encounter (Signed)
Patient needs cath rescheduled please advise

## 2018-11-24 DIAGNOSIS — M545 Low back pain: Secondary | ICD-10-CM | POA: Diagnosis not present

## 2018-11-24 DIAGNOSIS — M4316 Spondylolisthesis, lumbar region: Secondary | ICD-10-CM | POA: Diagnosis not present

## 2018-11-24 DIAGNOSIS — M4126 Other idiopathic scoliosis, lumbar region: Secondary | ICD-10-CM | POA: Diagnosis not present

## 2018-11-24 DIAGNOSIS — Z6829 Body mass index (BMI) 29.0-29.9, adult: Secondary | ICD-10-CM | POA: Diagnosis not present

## 2018-11-24 DIAGNOSIS — M5416 Radiculopathy, lumbar region: Secondary | ICD-10-CM | POA: Diagnosis not present

## 2018-11-24 DIAGNOSIS — R030 Elevated blood-pressure reading, without diagnosis of hypertension: Secondary | ICD-10-CM | POA: Diagnosis not present

## 2018-11-24 NOTE — Telephone Encounter (Signed)
Called patient back. Informed him of Dr. Wendy Poet recommendation. Scheduled him for appointment next week. No further questions.

## 2018-11-29 ENCOUNTER — Telehealth: Payer: Self-pay | Admitting: *Deleted

## 2018-11-29 DIAGNOSIS — M7989 Other specified soft tissue disorders: Secondary | ICD-10-CM

## 2018-11-29 NOTE — Telephone Encounter (Signed)
I called pt and he states Dr. Donald Pore office has a mobile unit that come to the office periodically and he would like both of the MRIs performed with that group. I asked pt to have Dr. Melven Sartorius MRI scheduler contact me for specific information to re-certify the MRI of the foot.

## 2018-11-29 NOTE — Telephone Encounter (Signed)
Pt states he received prior authorization for the MRI of his foot and in the meantime Dr. Vertell Limber wants him to have a MRI on the back.

## 2018-11-30 ENCOUNTER — Telehealth: Payer: Self-pay | Admitting: Podiatry

## 2018-11-30 ENCOUNTER — Other Ambulatory Visit: Payer: Self-pay

## 2018-11-30 ENCOUNTER — Telehealth: Payer: Self-pay | Admitting: *Deleted

## 2018-11-30 ENCOUNTER — Ambulatory Visit (INDEPENDENT_AMBULATORY_CARE_PROVIDER_SITE_OTHER): Payer: Medicare HMO | Admitting: Cardiology

## 2018-11-30 ENCOUNTER — Encounter: Payer: Self-pay | Admitting: Cardiology

## 2018-11-30 VITALS — BP 154/74 | HR 69 | Ht 71.0 in | Wt 212.0 lb

## 2018-11-30 DIAGNOSIS — I4819 Other persistent atrial fibrillation: Secondary | ICD-10-CM | POA: Diagnosis not present

## 2018-11-30 DIAGNOSIS — I10 Essential (primary) hypertension: Secondary | ICD-10-CM | POA: Diagnosis not present

## 2018-11-30 DIAGNOSIS — G20 Parkinson's disease: Secondary | ICD-10-CM | POA: Diagnosis not present

## 2018-11-30 MED ORDER — METOPROLOL SUCCINATE ER 25 MG PO TB24: 25.0000 mg | ORAL_TABLET | Freq: Every day | ORAL | 1 refills | Status: DC

## 2018-11-30 NOTE — Telephone Encounter (Signed)
Left message for [facility] - Raquel Sarna to call with information to refer pt for the MRI of the foot.

## 2018-11-30 NOTE — Telephone Encounter (Signed)
[facility] - Truman Hayward states orders can be faxed to [phone].

## 2018-11-30 NOTE — Progress Notes (Signed)
Cardiology Office Note:    Date:  11/30/2018   ID:  Samuel Moran, DOB 05-May-1946, MRN [id number]  PCP:  Park Liter, MD  Cardiologist:  Jenne Campus, MD    Referring MD: Park Liter, MD   Chief Complaint  Patient presents with  . Here for EKG  I am here to talk about my atrial fibrillation  History of Present Illness:    Samuel Moran is a 73 y.o. male past medical history significant for persistent atrial fibrillation, 1 trial to cardiovert him to sinus rhythm was unsuccessful meaning that he went back to atrial fibrillation very quickly after that.  After that because of symptomatology we decided to put him on antiarrhythmic therapy after stress test being negative flecainide being initiated and then we were trying to do cardioversion with however coronavirus situation happened and eventually he did not end up having cardioversion.  He comes today to talk about options for this situation right now his EKG today still showed atrial fibrillation with controlled ventricular rate, poor R wave progression anterior precordium, nonspecific ST segment changes.  He overall complain of being weak tired and exhausted still trying to be active and walks he does have a dog named Sasha and he try to walk with the dog on a regular basis.  Of course is difficult to tell if his symptoms are related to his Parkinson that is progressing or atrial fibrillation. We talked in length today about options for it he does not remember taking Tambocor.  Will call pharmacy try to find out if he does that we will schedule him for cardioversion  Past Medical History:  Diagnosis Date  . Atrial fibrillation ([place])    onset 07/17/17  . Dysrhythmia   . ED (erectile dysfunction)   . GERD 07/01/2007  . Parkinson's disease ([place])   . 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 PROSTATE SPECIFIC ANTIGEN, ELEVATED 06/29/2007  . Rheumatoid arthritis ([place])   . SEIZURE DISORDER 07/01/2007   one epsiode only   . Spinal stenosis of lumbar region 09/22/2014    Past Surgical History:  Procedure Laterality Date  . AMPUTATION     right great toe - tramatic age 42  . CARDIOVERSION N/A 03/15/2018   Procedure: CARDIOVERSION;  Surgeon: [name], MD;  [facility] ENDOSCOPY;  Service: Cardiovascular;  Laterality: N/A;  . COLONOSCOPY    . DEBRIDEMENT TOE Right 07/2016  . HAND SURGERY  03/2017   removal cysts  . IR FLUORO GUIDE CV LINE RIGHT  10/16/2016  . IR US GUIDE VASC ACCESS RIGHT  10/16/2016  . OSTECTOMY Right 08/05/2017   Procedure: OSTECTOMY COMPLETE METARSAL HEAD FIRST RIGHT;  Surgeon: Trula Slade, DPM;  Location: [facility];  Service: Podiatry;  Laterality: Right;    Current Medications: Current Meds  Medication Sig  . carbidopa-levodopa (SINEMET IR) 25-100 MG tablet TAKE 1 TABLET BY MOUTH THREE TIMES A DAY  . diclofenac sodium (VOLTAREN) 1 % GEL Apply 2 g topically 4 (four) times daily. Rub into affected area of foot 2 to 4 times daily  . ELIQUIS 5 MG TABS tablet TAKE 1 TABLET BY MOUTH TWICE A DAY (Patient taking differently: Take 5 mg by mouth 2 (two) times daily. )  . flecainide (TAMBOCOR) 50 MG tablet TAKE 1 TABLET BY MOUTH TWICE A DAY (Patient taking differently: Take 50 mg by mouth 2 (two) times daily. )  . ibuprofen (ADVIL,MOTRIN) 200 MG tablet Take 800 mg by mouth every 8 (eight) hours as  needed (for pain/headaches.).  Marland Kitchen leflunomide (ARAVA) 20 MG tablet Take 20 mg by mouth daily.  . metoprolol succinate (TOPROL-XL) 50 MG 24 hr tablet TAKE 1 TABLET (50 MG TOTAL) BY MOUTH DAILY. TAKE WITH OR IMMEDIATELY FOLLOWING A MEAL.  . Multiple Vitamin (MULTIVITAMIN WITH MINERALS) TABS tablet Take 1 tablet by mouth daily. Centrum Silver  . NEUPRO 4 MG/24HR PLACE 1 PATCH ONTO THE SKIN DAILY.  . NON FORMULARY Apply 1 application topically daily as needed (pain.). Chinese Herb Rub  . Omega-3 Fatty Acids (FISH OIL) 1200 MG CPDR Take 1,200 mg by mouth daily.  Marland Kitchen omeprazole (PRILOSEC) 20 MG capsule  Take 20 mg by mouth daily.      Allergies:   Patient has no known allergies.   Social History   Socioeconomic History  . Marital status: Single    Spouse name: Not on file  . Number of children: Not on file  . Years of education: Not on file  . Highest education level: Not on file  Occupational History  . Occupation: Chartered certified accountant  Social Needs  . Financial resource strain: Not on file  . Food insecurity    Worry: Not on file    Inability: Not on file  . Transportation needs    Medical: Not on file    Non-medical: Not on file  Tobacco Use  . Smoking status: Light Tobacco Smoker    Types: Cigars  . Smokeless tobacco: Never Used  . Tobacco comment: 1 cigar, takes 3 weeks  Substance and Sexual Activity  . Alcohol use: Yes    Alcohol/week: 4.0 standard drinks    Types: 4 Standard drinks or equivalent per week    Comment: one drink a day (liquor)  . Drug use: No  . Sexual activity: Not on file  Lifestyle  . Physical activity    Days per week: Not on file    Minutes per session: Not on file  . Stress: Not on file  Relationships  . Social Herbalist on phone: Not on file    Gets together: Not on file    Attends religious service: Not on file    Active member of club or organization: Not on file    Attends meetings of clubs or organizations: Not on file    Relationship status: Not on file  Other Topics Concern  . Not on file  Social History Narrative  . Not on file     Family History: The patient's family history includes Alcoholism in his mother; Arthritis in his father and mother. There is no history of Colon cancer, Esophageal cancer, Rectal cancer, or Stomach cancer. ROS:   Please see the history of present illness.    All 14 point review of systems negative except as described per history of present illness  EKGs/Labs/Other Studies Reviewed:      Recent Labs: 07/22/2018: BUN 19; Creatinine, Ser 0.88; Hemoglobin 14.1; Platelets 237; Potassium  4.5; Sodium 137  Recent Lipid Panel    Component Value Date/Time   CHOL 168 02/26/2017 0922   TRIG 55.0 02/26/2017 0922   HDL 52.90 02/26/2017 0922   CHOLHDL 3 02/26/2017 0922   VLDL 11.0 02/26/2017 0922   LDLCALC 104 (H) 02/26/2017 0922    Physical Exam:    VS:  BP (!) 154/74   Pulse 69   Ht 5\' 11"  (1.803 m)   Wt 212 lb (96.2 kg)   SpO2 98%   BMI 29.57 kg/m  Wt Readings from Last 3 Encounters:  11/30/18 212 lb (96.2 kg)  08/23/18 215 lb (97.5 kg)  08/23/18 215 lb (97.5 kg)     GEN:  Well nourished, well developed in no acute distress HEENT: Normal NECK: No JVD; No carotid bruits LYMPHATICS: No lymphadenopathy CARDIAC: RRR, no murmurs, no rubs, no gallops RESPIRATORY:  Clear to auscultation without rales, wheezing or rhonchi  ABDOMEN: Soft, non-tender, non-distended MUSCULOSKELETAL:  No edema; No deformity  SKIN: Warm and dry LOWER EXTREMITIES: no swelling NEUROLOGIC:  Alert and oriented x 3 PSYCHIATRIC:  Normal affect   ASSESSMENT:    1. Persistent atrial fibrillation   2. Essential hypertension   3. Parkinson disease ([place])    PLAN:    In order of problems listed above:  1. Persistent atrial fibrillation.  We called pharmacy to find out that he did pick up his Tambocor/flecainide.  Therefore will make arrangements for cardioversion.  I also asked him to check his medication, let me know exactly if he take that medication.  I stressed importance of taking Eliquis on a regular basis as well.  Cardioversion explained to him including all risk benefits as well as alternative will 2. Essential hypertension blood pressure appears to be well controlled continue present management. 3. Parkinson disease.  Followed by neurology   Medication Adjustments/Labs and Tests Ordered: Current medicines are reviewed at length with the patient today.  Concerns regarding medicines are outlined above.  No orders of the defined types were placed in this encounter.  Medication  changes: No orders of the defined types were placed in this encounter.   Signed, Park Liter, MD, [facility] 11/30/2018 9:12 AM    [place]

## 2018-11-30 NOTE — H&P (View-Only) (Signed)
Cardiology Office Note:    Date:  11/30/2018   ID:  Samuel Moran, DOB May 30, 1945, MRN [id number]  PCP:  Park Liter, MD  Cardiologist:  Jenne Campus, MD    Referring MD: Park Liter, MD   Chief Complaint  Patient presents with  . Here for EKG  I am here to talk about my atrial fibrillation  History of Present Illness:    Samuel Moran is a 73 y.o. male past medical history significant for persistent atrial fibrillation, 1 trial to cardiovert him to sinus rhythm was unsuccessful meaning that he went back to atrial fibrillation very quickly after that.  After that because of symptomatology we decided to put him on antiarrhythmic therapy after stress test being negative flecainide being initiated and then we were trying to do cardioversion with however coronavirus situation happened and eventually he did not end up having cardioversion.  He comes today to talk about options for this situation right now his EKG today still showed atrial fibrillation with controlled ventricular rate, poor R wave progression anterior precordium, nonspecific ST segment changes.  He overall complain of being weak tired and exhausted still trying to be active and walks he does have a dog named Sasha and he try to walk with the dog on a regular basis.  Of course is difficult to tell if his symptoms are related to his Parkinson that is progressing or atrial fibrillation. We talked in length today about options for it he does not remember taking Tambocor.  Will call pharmacy try to find out if he does that we will schedule him for cardioversion  Past Medical History:  Diagnosis Date  . Atrial fibrillation ([place])    onset 07/17/17  . Dysrhythmia   . ED (erectile dysfunction)   . GERD 07/01/2007  . Parkinson's disease ([place])   . 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 PROSTATE SPECIFIC ANTIGEN, ELEVATED 06/29/2007  . Rheumatoid arthritis ([place])   . SEIZURE DISORDER 07/01/2007   one epsiode only   . Spinal stenosis of lumbar region 09/22/2014    Past Surgical History:  Procedure Laterality Date  . AMPUTATION     right great toe - tramatic age 60  . CARDIOVERSION N/A 03/15/2018   Procedure: CARDIOVERSION;  Surgeon: [name], MD;  Location[facility] ENDOSCOPY;  Service: Cardiovascular;  Laterality: N/A;  . COLONOSCOPY    . DEBRIDEMENT TOE Right 07/2016  . HAND SURGERY  03/2017   removal cysts  . IR FLUORO GUIDE CV LINE RIGHT  10/16/2016  . IR US GUIDE VASC ACCESS RIGHT  10/16/2016  . OSTECTOMY Right 08/05/2017   Procedure: OSTECTOMY COMPLETE METARSAL HEAD FIRST RIGHT;  Surgeon: Trula Slade, DPM;  Location: [facility];  Service: Podiatry;  Laterality: Right;    Current Medications: Current Meds  Medication Sig  . carbidopa-levodopa (SINEMET IR) 25-100 MG tablet TAKE 1 TABLET BY MOUTH THREE TIMES A DAY  . diclofenac sodium (VOLTAREN) 1 % GEL Apply 2 g topically 4 (four) times daily. Rub into affected area of foot 2 to 4 times daily  . ELIQUIS 5 MG TABS tablet TAKE 1 TABLET BY MOUTH TWICE A DAY (Patient taking differently: Take 5 mg by mouth 2 (two) times daily. )  . flecainide (TAMBOCOR) 50 MG tablet TAKE 1 TABLET BY MOUTH TWICE A DAY (Patient taking differently: Take 50 mg by mouth 2 (two) times daily. )  . ibuprofen (ADVIL,MOTRIN) 200 MG tablet Take 800 mg by mouth every 8 (eight) hours as  needed (for pain/headaches.).  Marland Kitchen leflunomide (ARAVA) 20 MG tablet Take 20 mg by mouth daily.  . metoprolol succinate (TOPROL-XL) 50 MG 24 hr tablet TAKE 1 TABLET (50 MG TOTAL) BY MOUTH DAILY. TAKE WITH OR IMMEDIATELY FOLLOWING A MEAL.  . Multiple Vitamin (MULTIVITAMIN WITH MINERALS) TABS tablet Take 1 tablet by mouth daily. Centrum Silver  . NEUPRO 4 MG/24HR PLACE 1 PATCH ONTO THE SKIN DAILY.  . NON FORMULARY Apply 1 application topically daily as needed (pain.). Chinese Herb Rub  . Omega-3 Fatty Acids (FISH OIL) 1200 MG CPDR Take 1,200 mg by mouth daily.  Marland Kitchen omeprazole (PRILOSEC) 20 MG capsule  Take 20 mg by mouth daily.      Allergies:   Patient has no known allergies.   Social History   Socioeconomic History  . Marital status: Single    Spouse name: Not on file  . Number of children: Not on file  . Years of education: Not on file  . Highest education level: Not on file  Occupational History  . Occupation: Chartered certified accountant  Social Needs  . Financial resource strain: Not on file  . Food insecurity    Worry: Not on file    Inability: Not on file  . Transportation needs    Medical: Not on file    Non-medical: Not on file  Tobacco Use  . Smoking status: Light Tobacco Smoker    Types: Cigars  . Smokeless tobacco: Never Used  . Tobacco comment: 1 cigar, takes 3 weeks  Substance and Sexual Activity  . Alcohol use: Yes    Alcohol/week: 4.0 standard drinks    Types: 4 Standard drinks or equivalent per week    Comment: one drink a day (liquor)  . Drug use: No  . Sexual activity: Not on file  Lifestyle  . Physical activity    Days per week: Not on file    Minutes per session: Not on file  . Stress: Not on file  Relationships  . Social Herbalist on phone: Not on file    Gets together: Not on file    Attends religious service: Not on file    Active member of club or organization: Not on file    Attends meetings of clubs or organizations: Not on file    Relationship status: Not on file  Other Topics Concern  . Not on file  Social History Narrative  . Not on file     Family History: The patient's family history includes Alcoholism in his mother; Arthritis in his father and mother. There is no history of Colon cancer, Esophageal cancer, Rectal cancer, or Stomach cancer. ROS:   Please see the history of present illness.    All 14 point review of systems negative except as described per history of present illness  EKGs/Labs/Other Studies Reviewed:      Recent Labs: 07/22/2018: BUN 19; Creatinine, Ser 0.88; Hemoglobin 14.1; Platelets 237; Potassium  4.5; Sodium 137  Recent Lipid Panel    Component Value Date/Time   CHOL 168 02/26/2017 0922   TRIG 55.0 02/26/2017 0922   HDL 52.90 02/26/2017 0922   CHOLHDL 3 02/26/2017 0922   VLDL 11.0 02/26/2017 0922   LDLCALC 104 (H) 02/26/2017 0922    Physical Exam:    VS:  BP (!) 154/74   Pulse 69   Ht 5\' 11"  (1.803 m)   Wt 212 lb (96.2 kg)   SpO2 98%   BMI 29.57 kg/m  Wt Readings from Last 3 Encounters:  11/30/18 212 lb (96.2 kg)  08/23/18 215 lb (97.5 kg)  08/23/18 215 lb (97.5 kg)     GEN:  Well nourished, well developed in no acute distress HEENT: Normal NECK: No JVD; No carotid bruits LYMPHATICS: No lymphadenopathy CARDIAC: RRR, no murmurs, no rubs, no gallops RESPIRATORY:  Clear to auscultation without rales, wheezing or rhonchi  ABDOMEN: Soft, non-tender, non-distended MUSCULOSKELETAL:  No edema; No deformity  SKIN: Warm and dry LOWER EXTREMITIES: no swelling NEUROLOGIC:  Alert and oriented x 3 PSYCHIATRIC:  Normal affect   ASSESSMENT:    1. Persistent atrial fibrillation   2. Essential hypertension   3. Parkinson disease ([place])    PLAN:    In order of problems listed above:  1. Persistent atrial fibrillation.  We called pharmacy to find out that he did pick up his Tambocor/flecainide.  Therefore will make arrangements for cardioversion.  I also asked him to check his medication, let me know exactly if he take that medication.  I stressed importance of taking Eliquis on a regular basis as well.  Cardioversion explained to him including all risk benefits as well as alternative will 2. Essential hypertension blood pressure appears to be well controlled continue present management. 3. Parkinson disease.  Followed by neurology   Medication Adjustments/Labs and Tests Ordered: Current medicines are reviewed at length with the patient today.  Concerns regarding medicines are outlined above.  No orders of the defined types were placed in this encounter.  Medication  changes: No orders of the defined types were placed in this encounter.   Signed, Park Liter, MD, [facility] 11/30/2018 9:12 AM    [place]

## 2018-11-30 NOTE — Telephone Encounter (Signed)
I informed pt of the change of location and he could contact [facility] to schedule.

## 2018-11-30 NOTE — Telephone Encounter (Signed)
[place] - MEDICAID-TANY[place] M. CHANGED THE LOCATION OF THE MRI TO [facility], THE AUTHORIZATION:  [id number], VALID UNTIL 05/01/2019 WILL REMAIN THE SAME. Faxed the changed location orders to [facility].

## 2018-11-30 NOTE — Telephone Encounter (Signed)
[facility] called requesting to speak with the nurse. One of their doctors is also wanting to schedule an MRI for the patient and wanted to coordinate with [place] so that the patient can have both MRI's done at the same visit.   [phone] Ext. [phone]

## 2018-11-30 NOTE — Patient Instructions (Addendum)
Medication Instructions:.  Your physician has recommended you make the following change in your medication:   Decrease: Metoprolol succinate to 25 mg daily   If you need a refill on your cardiac medications before your next appointment, please call your pharmacy.   Lab work: None.  If you have labs (blood work) drawn today and your tests are completely normal, you will receive your results only by: Marland Kitchen MyChart Message (if you have MyChart) OR . A paper copy in the mail If you have any lab test that is abnormal or we need to change your treatment, we will call you to review the results.  Testing/Procedures: None.   Follow-Up: At [facility], you and your health needs are our priority.  As part of our continuing mission to provide you with exceptional heart care, we have created designated Provider Care Teams.  These Care Teams include your primary Cardiologist (physician) and Advanced Practice Providers (APPs -  Physician Assistants and Nurse Practitioners) who all work together to provide you with the care you need, when you need it. . You will need a follow up appointment in 1 months.   Any Other Special Instructions Will Be Listed Below (If Applicable).

## 2018-11-30 NOTE — Telephone Encounter (Signed)
Attempted to call patient back to confirm medication and see what days work for him for cardioversion. No answer and ano voicemail. Will  Continue efforts.

## 2018-11-30 NOTE — Addendum Note (Signed)
Addended by: Ashok Norris on: 11/30/2018 09:28 AM   Modules accepted: Orders

## 2018-11-30 NOTE — Addendum Note (Signed)
Addended by: Ashok Norris on: 11/30/2018 09:31 AM   Modules accepted: Orders

## 2018-11-30 NOTE — Telephone Encounter (Signed)
Pt called to let Dr. Raliegh Ip know he is taking the Flecainide 50 mg once daily.

## 2018-12-02 DIAGNOSIS — M4126 Other idiopathic scoliosis, lumbar region: Secondary | ICD-10-CM | POA: Diagnosis not present

## 2018-12-02 DIAGNOSIS — I4819 Other persistent atrial fibrillation: Secondary | ICD-10-CM | POA: Diagnosis not present

## 2018-12-02 DIAGNOSIS — I10 Essential (primary) hypertension: Secondary | ICD-10-CM | POA: Diagnosis not present

## 2018-12-02 NOTE — Addendum Note (Signed)
Addended by: Ashok Norris on: 12/02/2018 12:19 PM   Modules accepted: Orders

## 2018-12-02 NOTE — Telephone Encounter (Signed)
Left message for patient to return call.

## 2018-12-02 NOTE — Telephone Encounter (Signed)
Patient called back and was informed of cardio version time, covid testing time and all instructions. Patient verbally understood no further questions.

## 2018-12-02 NOTE — Telephone Encounter (Signed)
Dear Samuel Moran You are scheduled for a Cardioversion on August 10th 2020 with Dr. Recardo Evangelist.  Please arrive at the [facility] (Main Entrance A) at [facility]: [address] [place], [place] [address] at 7 am.   DIET: Nothing to eat or drink after midnight except a sip of water with medications (see medication instructions below)  Medication Instructions: Continue your anticoagulant: eliquis  You will need to continue your anticoagulant after your procedure until you  are told by your  Provider that it is safe to stop   Labs: You will need labs within 1 week. BMP, CBC     You must have a responsible person to drive you home and stay in the waiting area during your procedure. Failure to do so could result in cancellation.  Bring your insurance cards.  *Special Note: Every effort is made to have your procedure done on time. Occasionally there are emergencies that occur at the hospital that may cause delays. Please be patient if a delay does occur.    Patient is scheduled for cardioverison. Left message for patient to return call to inform him of this.

## 2018-12-03 ENCOUNTER — Telehealth: Payer: Self-pay | Admitting: Emergency Medicine

## 2018-12-03 LAB — BASIC METABOLIC PANEL
BUN/Creatinine Ratio: 19 (ref 10–24)
BUN: 16 mg/dL (ref 8–27)
CO2: 24 mmol/L (ref 20–29)
Calcium: 9.1 mg/dL (ref 8.6–10.2)
Chloride: 104 mmol/L (ref 96–106)
Creatinine, Ser: 0.83 mg/dL (ref 0.76–1.27)
GFR calc Af Amer: 102 mL/min/{1.73_m2} (ref >59)
GFR calc non Af Amer: 89 mL/min/{1.73_m2} (ref >59)
Glucose: 79 mg/dL (ref 65–99)
Potassium: 4.3 mmol/L (ref 3.5–5.2)
Sodium: 141 mmol/L (ref 134–144)

## 2018-12-03 LAB — CBC
Hematocrit: 41.6 % (ref 37.5–51.0)
Hemoglobin: 14.1 g/dL (ref 13.0–17.7)
MCH: 31.1 pg (ref 26.6–33.0)
MCHC: 33.9 g/dL (ref 31.5–35.7)
MCV: 92 fL (ref 79–97)
Platelets: 222 10*3/uL (ref 150–450)
RBC: 4.53 x10E6/uL (ref 4.14–5.80)
RDW: 13.1 % (ref 11.6–15.4)
WBC: 6.8 10*3/uL (ref 3.4–10.8)

## 2018-12-03 NOTE — Telephone Encounter (Signed)
Left message for patient to return call regarding lab results.  

## 2018-12-09 DIAGNOSIS — M4126 Other idiopathic scoliosis, lumbar region: Secondary | ICD-10-CM | POA: Diagnosis not present

## 2018-12-09 DIAGNOSIS — M19072 Primary osteoarthritis, left ankle and foot: Secondary | ICD-10-CM | POA: Diagnosis not present

## 2018-12-09 DIAGNOSIS — R2242 Localized swelling, mass and lump, left lower limb: Secondary | ICD-10-CM | POA: Diagnosis not present

## 2018-12-09 DIAGNOSIS — M48061 Spinal stenosis, lumbar region without neurogenic claudication: Secondary | ICD-10-CM | POA: Diagnosis not present

## 2018-12-09 DIAGNOSIS — M47816 Spondylosis without myelopathy or radiculopathy, lumbar region: Secondary | ICD-10-CM | POA: Diagnosis not present

## 2018-12-10 ENCOUNTER — Other Ambulatory Visit (HOSPITAL_COMMUNITY)
Admission: RE | Admit: 2018-12-10 | Discharge: 2018-12-10 | Disposition: A | Discharge status: Home / Self Care | Payer: Medicare HMO | Source: Ambulatory Visit | Attending: Cardiovascular Disease | Admitting: Cardiovascular Disease

## 2018-12-10 DIAGNOSIS — Z20828 Contact with and (suspected) exposure to other viral communicable diseases: Secondary | ICD-10-CM | POA: Diagnosis not present

## 2018-12-10 DIAGNOSIS — Z01812 Encounter for preprocedural laboratory examination: Secondary | ICD-10-CM | POA: Insufficient documentation

## 2018-12-10 LAB — SARS CORONAVIRUS 2 (TAT 6-24 HRS): SARS Coronavirus 2: NEGATIVE

## 2018-12-13 ENCOUNTER — Ambulatory Visit (HOSPITAL_COMMUNITY): Payer: Medicare HMO | Admitting: Certified Registered"

## 2018-12-13 ENCOUNTER — Other Ambulatory Visit: Payer: Self-pay

## 2018-12-13 ENCOUNTER — Ambulatory Visit (HOSPITAL_COMMUNITY)
Admission: RE | Admit: 2018-12-13 | Discharge: 2018-12-13 | Disposition: A | Discharge status: Home / Self Care | Payer: Medicare HMO | Attending: Cardiovascular Disease | Admitting: Cardiovascular Disease

## 2018-12-13 ENCOUNTER — Encounter (HOSPITAL_COMMUNITY)
Admission: RE | Disposition: A | Discharge status: Home / Self Care | Payer: Self-pay | Source: Home / Self Care | Attending: Cardiovascular Disease

## 2018-12-13 ENCOUNTER — Encounter (HOSPITAL_COMMUNITY): Payer: Self-pay

## 2018-12-13 DIAGNOSIS — F17290 Nicotine dependence, other tobacco product, uncomplicated: Secondary | ICD-10-CM | POA: Insufficient documentation

## 2018-12-13 DIAGNOSIS — Z791 Long term (current) use of non-steroidal anti-inflammatories (NSAID): Secondary | ICD-10-CM | POA: Diagnosis not present

## 2018-12-13 DIAGNOSIS — I4819 Other persistent atrial fibrillation: Secondary | ICD-10-CM

## 2018-12-13 DIAGNOSIS — I10 Essential (primary) hypertension: Secondary | ICD-10-CM | POA: Insufficient documentation

## 2018-12-13 DIAGNOSIS — E785 Hyperlipidemia, unspecified: Secondary | ICD-10-CM | POA: Diagnosis not present

## 2018-12-13 DIAGNOSIS — Z7901 Long term (current) use of anticoagulants: Secondary | ICD-10-CM | POA: Insufficient documentation

## 2018-12-13 DIAGNOSIS — M48061 Spinal stenosis, lumbar region without neurogenic claudication: Secondary | ICD-10-CM | POA: Diagnosis not present

## 2018-12-13 DIAGNOSIS — G20 Parkinson's disease: Secondary | ICD-10-CM | POA: Diagnosis not present

## 2018-12-13 DIAGNOSIS — K219 Gastro-esophageal reflux disease without esophagitis: Secondary | ICD-10-CM | POA: Diagnosis not present

## 2018-12-13 DIAGNOSIS — Z79899 Other long term (current) drug therapy: Secondary | ICD-10-CM | POA: Diagnosis not present

## 2018-12-13 DIAGNOSIS — I480 Paroxysmal atrial fibrillation: Secondary | ICD-10-CM | POA: Diagnosis not present

## 2018-12-13 DIAGNOSIS — M069 Rheumatoid arthritis, unspecified: Secondary | ICD-10-CM | POA: Insufficient documentation

## 2018-12-13 DIAGNOSIS — R69 Illness, unspecified: Secondary | ICD-10-CM | POA: Diagnosis not present

## 2018-12-13 HISTORY — PX: CARDIOVERSION: SHX1299

## 2018-12-13 SURGERY — CARDIOVERSION
Anesthesia: General

## 2018-12-13 MED ORDER — SODIUM CHLORIDE 0.9 % IV SOLN
INTRAVENOUS | Status: DC
  Administered 2018-12-13: 08:00:00 via INTRAVENOUS

## 2018-12-13 MED ORDER — LIDOCAINE HCL (CARDIAC) PF 100 MG/5ML IV SOSY
PREFILLED_SYRINGE | INTRAVENOUS | Status: DC | PRN
  Administered 2018-12-13: 100 mg via INTRAVENOUS

## 2018-12-13 MED ORDER — PROPOFOL 10 MG/ML IV BOLUS
INTRAVENOUS | Status: DC | PRN
  Administered 2018-12-13: 50 mg via INTRAVENOUS

## 2018-12-13 NOTE — Interval H&P Note (Signed)
History and Physical Interval Note:  12/13/2018 7:52 AM  Samuel Moran  has presented today for surgery, with the diagnosis of A-FIB.  The various methods of treatment have been discussed with the patient and family. After consideration of risks, benefits and other options for treatment, the patient has consented to  Procedure(s): CARDIOVERSION (N/A) as a surgical intervention.  The patient's history has been reviewed, patient examined, no change in status, stable for surgery.  I have reviewed the patient's chart and labs.  Questions were answered to the patient's satisfaction.     [name]

## 2018-12-13 NOTE — Anesthesia Postprocedure Evaluation (Signed)
Anesthesia Post Note  Patient: Samuel Moran  Procedure(s) Performed: CARDIOVERSION (N/A )     Patient location during evaluation: Endoscopy Anesthesia Type: General Level of consciousness: awake and alert Pain management: pain level controlled Vital Signs Assessment: post-procedure vital signs reviewed and stable Respiratory status: spontaneous breathing, nonlabored ventilation and respiratory function stable Cardiovascular status: blood pressure returned to baseline and stable Postop Assessment: no apparent nausea or vomiting Anesthetic complications: no    Last Vitals:  Vitals:   12/13/18 0830 12/13/18 0836  BP: (!) 150/84 (!) 160/85  Pulse: 65 68  Resp: (!) 25 17  Temp:    SpO2: 96% 98%    Last Pain:  Vitals:   12/13/18 0836  TempSrc:   PainSc: 0-No pain                 Lidia Collum

## 2018-12-13 NOTE — Anesthesia Preprocedure Evaluation (Addendum)
Anesthesia Evaluation  Patient identified by MRN, date of birth, ID band Patient awake    Reviewed: Allergy & Precautions, NPO status , Patient's Chart, lab work & pertinent test results, reviewed documented beta blocker date and time   History of Anesthesia Complications Negative for: history of anesthetic complications  Airway Mallampati: II  TM Distance: >3 FB Neck ROM: Full    Dental   Pulmonary sleep apnea , Current Smoker and Patient abstained from smoking.,    Pulmonary exam normal        Cardiovascular hypertension, Pt. on medications and Pt. on home beta blockers + dysrhythmias  Rhythm:Irregular Rate:Normal     Neuro/Psych Seizures -, Well Controlled,  Parkinson's negative psych ROS   GI/Hepatic Neg liver ROS, GERD  ,  Endo/Other  negative endocrine ROS  Renal/GU negative Renal ROS  negative genitourinary   Musculoskeletal  (+) Arthritis , Rheumatoid disorders,    Abdominal   Peds  Hematology negative hematology ROS (+)   Anesthesia Other Findings Echo 07/27/17: EF 55-60%, PASP 39 Normal stress test 04/12/18  Reproductive/Obstetrics                           Anesthesia Physical Anesthesia Plan  ASA: III  Anesthesia Plan: General   Post-op Pain Management:    Induction: Intravenous  PONV Risk Score and Plan: TIVA and Treatment may vary due to age or medical condition  Airway Management Planned: Mask  Additional Equipment: None  Intra-op Plan:   Post-operative Plan:   Informed Consent: I have reviewed the patients History and Physical, chart, labs and discussed the procedure including the risks, benefits and alternatives for the proposed anesthesia with the patient or authorized representative who has indicated his/her understanding and acceptance.       Plan Discussed with:   Anesthesia Plan Comments:        Anesthesia Quick Evaluation

## 2018-12-13 NOTE — Transfer of Care (Signed)
Immediate Anesthesia Transfer of Care Note  Patient: Samuel Moran  Procedure(s) Performed: CARDIOVERSION (N/A )  Patient Location: Endoscopy Unit  Anesthesia Type:General  Level of Consciousness: awake, alert  and oriented  Airway & Oxygen Therapy: Patient Spontanous Breathing and Patient connected to face mask oxygen  Post-op Assessment: Report given to RN, Post -op Vital signs reviewed and stable and Patient moving all extremities X 4  Post vital signs: Reviewed and stable  Last Vitals:  Vitals Value Taken Time  BP    Temp    Pulse    Resp    SpO2      Last Pain:  Vitals:   12/13/18 0735  TempSrc: Oral  PainSc: 0-No pain         Complications: No apparent anesthesia complications

## 2018-12-13 NOTE — Op Note (Signed)
Procedure: Electrical Cardioversion Indications:  Atrial Fibrillation  Procedure Details:  Consent: Risks of procedure as well as the alternatives and risks of each were explained to the (patient/caregiver).  Consent for procedure obtained.  Time Out: Verified patient identification, verified procedure, site/side was marked, verified correct patient position, special equipment/implants available, medications/allergies/relevent history reviewed, required imaging and test results available.  Performed  Patient placed on cardiac monitor, pulse oximetry, supplemental oxygen as necessary.  Sedation given: Propofol IV, Anesthesiology Pacer pads placed anterior and posterior chest.  Cardioverted 1 time(s).  Cardioversion with synchronized biphasic 150J shock.  Evaluation: Findings: Post procedure EKG shows: NSR Complications: None Patient did tolerate procedure well.  Time Spent Directly with the Patient:  30 minutes   Samuel Moran 12/13/2018, 8:16 AM

## 2018-12-14 ENCOUNTER — Encounter (HOSPITAL_COMMUNITY): Payer: Self-pay | Admitting: Cardiovascular Disease

## 2018-12-17 ENCOUNTER — Other Ambulatory Visit: Payer: Self-pay | Admitting: Neurology

## 2018-12-20 DIAGNOSIS — Z23 Encounter for immunization: Secondary | ICD-10-CM | POA: Diagnosis not present

## 2018-12-20 NOTE — Telephone Encounter (Signed)
Requested Prescriptions   Pending Prescriptions Disp Refills  . [place] 4 MG/24HR [Pharmacy Med Name: NEUPRO 4 MG/24 HR PATCH] 30 patch 5    Sig: APPLY 1 PATCH ONTO THE SKIN EVERY DAY   Rx last filled:06/07/18 #30 5 refills  Pt last seen:08/23/18   Follow up appt scheduled:01/25/19

## 2018-12-21 DIAGNOSIS — M5416 Radiculopathy, lumbar region: Secondary | ICD-10-CM | POA: Diagnosis not present

## 2018-12-21 DIAGNOSIS — M545 Low back pain: Secondary | ICD-10-CM | POA: Diagnosis not present

## 2018-12-21 DIAGNOSIS — R030 Elevated blood-pressure reading, without diagnosis of hypertension: Secondary | ICD-10-CM | POA: Diagnosis not present

## 2018-12-21 DIAGNOSIS — M4126 Other idiopathic scoliosis, lumbar region: Secondary | ICD-10-CM | POA: Diagnosis not present

## 2018-12-21 DIAGNOSIS — M4316 Spondylolisthesis, lumbar region: Secondary | ICD-10-CM | POA: Diagnosis not present

## 2018-12-21 DIAGNOSIS — Z6830 Body mass index (BMI) 30.0-30.9, adult: Secondary | ICD-10-CM | POA: Diagnosis not present

## 2018-12-24 ENCOUNTER — Other Ambulatory Visit: Payer: Self-pay | Admitting: Cardiology

## 2018-12-31 ENCOUNTER — Ambulatory Visit (INDEPENDENT_AMBULATORY_CARE_PROVIDER_SITE_OTHER): Payer: Medicare HMO | Admitting: Cardiology

## 2018-12-31 ENCOUNTER — Encounter: Payer: Self-pay | Admitting: Cardiology

## 2018-12-31 ENCOUNTER — Other Ambulatory Visit: Payer: Self-pay

## 2018-12-31 VITALS — BP 144/90 | HR 68 | Ht 71.0 in | Wt 213.4 lb

## 2018-12-31 DIAGNOSIS — I10 Essential (primary) hypertension: Secondary | ICD-10-CM

## 2018-12-31 DIAGNOSIS — I480 Paroxysmal atrial fibrillation: Secondary | ICD-10-CM | POA: Diagnosis not present

## 2018-12-31 NOTE — Patient Instructions (Signed)
Medication Instructions:  Your physician recommends that you continue on your current medications as directed. Please refer to the Current Medication list given to you today.  If you need a refill on your cardiac medications before your next appointment, please call your pharmacy.   Lab work: None. If you have labs (blood work) drawn today and your tests are completely normal, you will receive your results only by: Marland Kitchen MyChart Message (if you have MyChart) OR . A paper copy in the mail If you have any lab test that is abnormal or we need to change your treatment, we will call you to review the results.  Testing/Procedures:  None.   Follow-Up: At [facility], you and your health needs are our priority.  As part of our continuing mission to provide you with exceptional heart care, we have created designated Provider Care Teams.  These Care Teams include your primary Cardiologist (physician) and Advanced Practice Providers (APPs -  Physician Assistants and Nurse Practitioners) who all work together to provide you with the care you need, when you need it. You will need a follow up appointment in 2 months.  Please call our office 2 months in advance to schedule this appointment.  You may see Jenne Campus, MD or another member of our [place] Provider Team in [place]: Shirlee More, MD . Jyl Heinz, MD  Any Other Special Instructions Will Be Listed Below (If Applicable).  Dr. Agustin Cree has referred you to see Dr. Curt Bears.

## 2018-12-31 NOTE — Progress Notes (Signed)
Cardiology Office Note:    Date:  12/31/2018   ID:  Rabih Pasqual, DOB 02/03/1946, MRN [id number]  PCP:  Park Liter, MD  Cardiologist:  Jenne Campus, MD    Referring MD: Park Liter, MD   Chief Complaint  Patient presents with   Follow-up    History of Present Illness:    Samuel Moran is a 72 y.o. male with history of persistent atrial fibrillation.  The onset of atrial fibrillation happened in March 2019.  Since that time initially we try to convert him without any medications only anticoagulation very quickly after that he went back to atrial fibrillation.  Second attempt was done just last month that was after weeks of flecainide therapy 50 mg twice daily he converted to sinus rhythm he felt good for a few days and then started feeling weak and tired again comes today to my office for follow-up and he is back to atrial fibrillation.  His rate is controlled but clearly atrial fibrillation.  He is here to talk about options.  I think we reached the point that EP expertise is needed.  We talked about options for this situation option being different antiarrhythmic therapy, augmenting his flecainide, potentially atrial fibrillation ablation or simply AV control of atrial fibrillation.  He said he would like to think about it but he accepted offer to see electrophysiologist.  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   PROSTATE SPECIFIC ANTIGEN, ELEVATED 06/29/2007   Rheumatoid arthritis ([place])    SEIZURE DISORDER 07/01/2007   one epsiode only   Spinal stenosis of lumbar region 09/22/2014    Past Surgical History:  Procedure Laterality Date   AMPUTATION     right great toe - tramatic age 33   CARDIOVERSION N/A 03/15/2018   Procedure: CARDIOVERSION;  Surgeon: [name],  MD;  Location: [facility] ENDOSCOPY;  Service: Cardiovascular;  Laterality: N/A;   CARDIOVERSION N/A 12/13/2018   Procedure: CARDIOVERSION;  Surgeon: Sanda Klein, MD;  Location: [place] ENDOSCOPY;  Service: Cardiovascular;  Laterality: N/A;   COLONOSCOPY     DEBRIDEMENT TOE Right 07/2016   HAND SURGERY  03/2017   removal cysts   IR FLUORO GUIDE CV LINE RIGHT  10/16/2016   IR US GUIDE VASC ACCESS RIGHT  10/16/2016   OSTECTOMY Right 08/05/2017   Procedure: OSTECTOMY COMPLETE METARSAL HEAD FIRST RIGHT;  Surgeon: Trula Slade, DPM;  Location: [facility];  Service: Podiatry;  Laterality: Right;    Current Medications: Current Meds  Medication Sig   B Complex-C (SUPER B COMPLEX PO) Take 1 tablet by mouth every evening.   carbidopa-levodopa (SINEMET IR) 25-100 MG tablet TAKE 1 TABLET BY MOUTH THREE TIMES A DAY (Patient taking differently: Take 1 tablet by mouth 3 (three) times daily. (AM, NOON, 1600))   ELIQUIS 5 MG TABS tablet TAKE 1 TABLET BY MOUTH TWICE A DAY   flecainide (TAMBOCOR) 50 MG tablet TAKE 1 TABLET BY MOUTH TWICE A DAY (Patient taking differently: Take 50 mg by mouth 2 (two) times daily. )   ibuprofen (ADVIL,MOTRIN) 200 MG tablet Take 800 mg by mouth 2 (two) times daily as needed (for pain/headaches.).    leflunomide (ARAVA) 20 MG tablet Take 20 mg by mouth daily.   metoprolol succinate (TOPROL-XL) 25 MG 24 hr tablet  Take 1 tablet (25 mg total) by mouth daily. Take with or immediately following a meal.   Multiple Vitamin (MULTIVITAMIN WITH MINERALS) TABS tablet Take 1 tablet by mouth daily. Centrum Silver   NEUPRO 4 MG/24HR APPLY 1 PATCH ONTO THE SKIN EVERY DAY   Omega-3 Fatty Acids (FISH OIL) 1200 MG CPDR Take 1,200 mg by mouth daily.   omeprazole (PRILOSEC) 20 MG capsule Take 20 mg by mouth daily.      Allergies:   Patient has no known allergies.   Social History   Socioeconomic History   Marital status: Single    Spouse name: Not on file   Number of children: Not  on file   Years of education: Not on file   Highest education level: Not on file  Occupational History   Occupation: Chartered certified accountant  Social Needs   Financial resource strain: Not on file   Food insecurity    Worry: Not on file    Inability: Not on file   Transportation needs    Medical: Not on file    Non-medical: Not on file  Tobacco Use   Smoking status: Light Tobacco Smoker    Types: Cigars   Smokeless tobacco: Never Used   Tobacco comment: 1 cigar, takes 3 weeks  Substance and Sexual Activity   Alcohol use: Yes    Alcohol/week: 4.0 standard drinks    Types: 4 Standard drinks or equivalent per week    Comment: one drink a day (liquor)   Drug use: No   Sexual activity: Not on file  Lifestyle   Physical activity    Days per week: Not on file    Minutes per session: Not on file   Stress: Not on file  Relationships   Social connections    Talks on phone: Not on file    Gets together: Not on file    Attends religious service: Not on file    Active member of club or organization: Not on file    Attends meetings of clubs or organizations: Not on file    Relationship status: Not on file  Other Topics Concern   Not on file  Social History Narrative   Not on file     Family History: The patient's family history includes Alcoholism in his mother; Arthritis in his father and mother. There is no history of Colon cancer, Esophageal cancer, Rectal cancer, or Stomach cancer. ROS:   Please see the history of present illness.    All 14 point review of systems negative except as described per history of present illness  EKGs/Labs/Other Studies Reviewed:      Recent Labs: 12/02/2018: BUN 16; Creatinine, Ser 0.83; Hemoglobin 14.1; Platelets 222; Potassium 4.3; Sodium 141  Recent Lipid Panel    Component Value Date/Time   CHOL 168 02/26/2017 0922   TRIG 55.0 02/26/2017 0922   HDL 52.90 02/26/2017 0922   CHOLHDL 3 02/26/2017 0922   VLDL 11.0 02/26/2017 0922     LDLCALC 104 (H) 02/26/2017 0922    Physical Exam:    VS:  BP (!) 144/90    Pulse 68    Ht 5\' 11"  (1.803 m)    Wt 213 lb 6.4 oz (96.8 kg)    SpO2 98%    BMI 29.76 kg/m     Wt Readings from Last 3 Encounters:  12/31/18 213 lb 6.4 oz (96.8 kg)  11/30/18 212 lb (96.2 kg)  08/23/18 215 lb (97.5 kg)     GEN:  Well nourished, well developed in no acute distress HEENT: Normal NECK: No JVD; No carotid bruits LYMPHATICS: No lymphadenopathy CARDIAC: Irregularly irregular, no murmurs, no rubs, no gallops RESPIRATORY:  Clear to auscultation without rales, wheezing or rhonchi  ABDOMEN: Soft, non-tender, non-distended MUSCULOSKELETAL:  No edema; No deformity  SKIN: Warm and dry LOWER EXTREMITIES: no swelling NEUROLOGIC:  Alert and oriented x 3 PSYCHIATRIC:  Normal affect   ASSESSMENT:    1. Paroxysmal atrial fibrillation (HCC)   2. Essential hypertension   3. Parkinson disease ([place])    PLAN:    In order of problems listed above:  1. Persistent atrial fibrillation recent cardioversion with flecainide on board went back to atrial fibrillation obviously he is very frustrated about the situation.  This is second attempt to cardiovert him.  Referral to EP will be done to talk about options.  I already talked to him about potentially atrial fibrillation ablation. 2. Essential hypertension blood pressure controlled continue present management. 3. Parkinson disease.  Noted  EKG done today showed atrial fibrillation with controlled ventricular rate.   Medication Adjustments/Labs and Tests Ordered: Current medicines are reviewed at length with the patient today.  Concerns regarding medicines are outlined above.  No orders of the defined types were placed in this encounter.  Medication changes: No orders of the defined types were placed in this encounter.   Signed, Park Liter, MD, [facility] 12/31/2018 9:18 AM    [place]

## 2018-12-31 NOTE — Addendum Note (Signed)
Addended by: Ashok Norris on: 12/31/2018 03:08 PM   Modules accepted: Orders

## 2019-01-03 ENCOUNTER — Other Ambulatory Visit: Payer: Self-pay

## 2019-01-03 MED ORDER — FLECAINIDE ACETATE 50 MG PO TABS: 50.0000 mg | ORAL_TABLET | Freq: Two times a day (BID) | ORAL | 6 refills | Status: DC

## 2019-01-17 DIAGNOSIS — M5416 Radiculopathy, lumbar region: Secondary | ICD-10-CM | POA: Diagnosis not present

## 2019-01-21 NOTE — Progress Notes (Signed)
Samuel Moran was seen today in follow up for Parkinsons disease.  Pt is currently on carbidopa/levodopa 25/100, 1 tablet 3 times per day at 8am/noon/4pm.  He is also on the rotigotine patch, 4 mg daily.  He has had no compulsive behaviors.  No sleep attacks.  Pt denies falls.  Pt denies lightheadedness, near syncope.  No hallucinations.  Mood has been good.  Medical records have been reviewed since last visit.  Patient had a cardioversion on December 13, 2018 for atrial fibrillation.  He did cardiovert, but within a few days went back to atrial fibrillation.  He has an appt with electrophysiology soon.  He is having a "pinched nerve" in his hip, making it difficult to sit for a prolonged time.  He is seeing Dr. Vertell Limber and had an injection about 2 weeks ago.  He is having more trouble with balance but thinks that it may be from the back and from a rheumatoid nodule on the foot.   PREVIOUS MEDICATIONS: PREVIOUS MEDICATIONS: Pramipexole (patient had a friend who had a sleep attack on the medication and subsequently the patient developed sleepiness on the medication and we decided to discontinue)  ALLERGIES:  No Known Allergies  CURRENT MEDICATIONS:  Outpatient Encounter Medications as of 01/25/2019  Medication Sig  . B Complex-C (SUPER B COMPLEX PO) Take 1 tablet by mouth every evening.  . carbidopa-levodopa (SINEMET IR) 25-100 MG tablet TAKE 1 TABLET BY MOUTH THREE TIMES A DAY (Patient taking differently: Take 1 tablet by mouth 3 (three) times daily. (AM, NOON, 1600))  . ELIQUIS 5 MG TABS tablet TAKE 1 TABLET BY MOUTH TWICE A DAY  . flecainide (TAMBOCOR) 50 MG tablet Take 1 tablet (50 mg total) by mouth 2 (two) times daily.  Marland Kitchen ibuprofen (ADVIL,MOTRIN) 200 MG tablet Take 800 mg by mouth 2 (two) times daily as needed (for pain/headaches.).   Marland Kitchen leflunomide (ARAVA) 20 MG tablet Take 20 mg by mouth daily.  . metoprolol succinate (TOPROL-XL) 25 MG 24 hr tablet Take 1 tablet (25 mg total) by mouth  daily. Take with or immediately following a meal.  . Multiple Vitamin (MULTIVITAMIN WITH MINERALS) TABS tablet Take 1 tablet by mouth daily. Centrum Silver  . NEUPRO 4 MG/24HR APPLY 1 PATCH ONTO THE SKIN EVERY DAY  . Omega-3 Fatty Acids (FISH OIL) 1200 MG CPDR Take 1,200 mg by mouth daily.  Marland Kitchen omeprazole (PRILOSEC) 20 MG capsule Take 20 mg by mouth daily.    No facility-administered encounter medications on file as of 01/25/2019.     PAST MEDICAL HISTORY:   Past Medical History:  Diagnosis Date  . Atrial fibrillation ([place])    onset 07/17/17  . Dysrhythmia   . ED (erectile dysfunction)   . GERD 07/01/2007  . Parkinson's disease ([place])   . 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 PROSTATE SPECIFIC ANTIGEN, ELEVATED 06/29/2007  . Rheumatoid arthritis ([place])   . SEIZURE DISORDER 07/01/2007   one epsiode only  . Spinal stenosis of lumbar region 09/22/2014    PAST SURGICAL HISTORY:   Past Surgical History:  Procedure Laterality Date  . AMPUTATION     right great toe - tramatic age 30  . CARDIOVERSION N/A 03/15/2018   Procedure: CARDIOVERSION;  Surgeon: [name], MD;  Location: [facility] ENDOSCOPY;  Service: Cardiovascular;  Laterality: N/A;  . CARDIOVERSION N/A 12/13/2018   Procedure: CARDIOVERSION;  Surgeon: Sanda Klein, MD;  Location: [facility] ENDOSCOPY;  Service: Cardiovascular;  Laterality: N/A;  . COLONOSCOPY    .  DEBRIDEMENT TOE Right 07/2016  . HAND SURGERY  03/2017   removal cysts  . IR FLUORO GUIDE CV LINE RIGHT  10/16/2016  . IR US GUIDE VASC ACCESS RIGHT  10/16/2016  . OSTECTOMY Right 08/05/2017   Procedure: OSTECTOMY COMPLETE METARSAL HEAD FIRST RIGHT;  Surgeon: Trula Slade, DPM;  Location: [place];  Service: Podiatry;  Laterality: Right;    SOCIAL HISTORY:   Social History   Socioeconomic History  . Marital status: Single    Spouse name: Not on file  . Number of children: 0  . Years of education: Not on file  . Highest education level: High school graduate  Occupational  History  . Occupation: Chartered certified accountant  Social Needs  . Financial resource strain: Not on file  . Food insecurity    Worry: Not on file    Inability: Not on file  . Transportation needs    Medical: Not on file    Non-medical: Not on file  Tobacco Use  . Smoking status: Light Tobacco Smoker    Types: Cigars  . Smokeless tobacco: Never Used  . Tobacco comment: 1 cigar, takes 3 weeks  Substance and Sexual Activity  . Alcohol use: Yes    Alcohol/week: 4.0 standard drinks    Types: 4 Standard drinks or equivalent per week    Comment: one drink a day (liquor)  . Drug use: No  . Sexual activity: Not on file  Lifestyle  . Physical activity    Days per week: Not on file    Minutes per session: Not on file  . Stress: Not on file  Relationships  . Social Herbalist on phone: Not on file    Gets together: Not on file    Attends religious service: Not on file    Active member of club or organization: Not on file    Attends meetings of clubs or organizations: Not on file    Relationship status: Not on file  . Intimate partner violence    Fear of current or ex partner: Not on file    Emotionally abused: Not on file    Physically abused: Not on file    Forced sexual activity: Not on file  Other Topics Concern  . Not on file  Social History Narrative  . Not on file    FAMILY HISTORY:   Family Status  Relation Name Status  . Mother  Deceased  . Father  Deceased  . Child adopted Alive  . Neg Hx  (Not Specified)    ROS:  ROS  PHYSICAL EXAMINATION:    VITALS:   Vitals:   01/25/19 0808  BP: (!) 151/92  Pulse: 79  SpO2: 99%  Weight: 217 lb (98.4 kg)  Height: 5' 11"  (1.803 m)    GEN:  The patient appears stated age and is in NAD. HEENT:  Normocephalic, atraumatic.  The mucous membranes are moist. The superficial temporal arteries are without ropiness or tenderness. CV:  irreg irreg Lungs:  CTAB Neck/HEME:  There are no carotid bruits bilaterally.   Neurological examination:  Orientation: The patient is alert and oriented x3. Cranial nerves: There is good facial symmetry with facial hypomimia. The speech is fluent and clear. Soft palate rises symmetrically and there is no tongue deviation. Hearing is intact to conversational tone. Sensation: Sensation is intact to light touch throughout Motor: Strength is at least antigravity x4.  Movement examination: Tone: There is normal tone in the UE/LE Abnormal movements: there  is RUE rest tremor>LUE resting tremor Coordination:  There is no decremation with RAM's, with any form of RAMS, including alternating supination and pronation of the forearm, hand opening and closing, finger taps, heel taps and toe taps. Gait and Station: The patient has  difficulty arising out of a deep-seated chair without the use of the hands but more because of back and R hip pain. The patient's stride length is decreased and he is shuffling.  The patient has an antalgic gait.   ASSESSMENT/PLAN:  1.  Idiopathic Parkinson's disease             -We discussed the diagnosis as well as pathophysiology of the disease.  We discussed treatment options as well as prognostic indicators.  Patient education was provided.             -continue neupro, 4 mg daily.  Discussed r/b/se of the medication extensively             -increase carbidopa/levodopa 25/100 7am/11am/3pm/7pm  -refer to neurorehab for PT  -met our PD social worker        2.  Rheumatoid arthritis             -seeing Dr. [name].   3.  A-fib             -s/p cardioversion x 2, with return to atrial fibrillation.  He has a referral to electrophysiology to talk about options.  4.  Obstructive sleep apnea syndrome.             -Sleep study in August, 2019 demonstrated mild sleep apnea (AHI 7.1).  This to recommended CPAP given cardiovascular comorbidities. Now on cpap.  5.  Back pain  -he is seeing Dr. Vertell Limber and having injections  6.  Depression  -due to  medical illness.  May benefit from counseling.  Social worker discussing with him.  7.  Follow up is anticipated in the next 4-6 months, sooner should new neurologic issues arise.  Much greater than 50% of this visit was spent in counseling and coordinating care.  Total face to face time:  25 min  Cc:  Park Liter, MD

## 2019-01-25 ENCOUNTER — Other Ambulatory Visit: Payer: Self-pay

## 2019-01-25 ENCOUNTER — Encounter: Payer: Self-pay | Admitting: Neurology

## 2019-01-25 ENCOUNTER — Ambulatory Visit: Payer: Medicare HMO | Admitting: Neurology

## 2019-01-25 VITALS — BP 151/92 | HR 79 | Ht 71.0 in | Wt 217.0 lb

## 2019-01-25 DIAGNOSIS — M545 Low back pain, unspecified: Secondary | ICD-10-CM

## 2019-01-25 DIAGNOSIS — G8929 Other chronic pain: Secondary | ICD-10-CM | POA: Diagnosis not present

## 2019-01-25 DIAGNOSIS — G4733 Obstructive sleep apnea (adult) (pediatric): Secondary | ICD-10-CM

## 2019-01-25 DIAGNOSIS — G20 Parkinson's disease: Secondary | ICD-10-CM | POA: Diagnosis not present

## 2019-01-25 MED ORDER — CARBIDOPA-LEVODOPA 25-100 MG PO TABS: 1.0000 | ORAL_TABLET | Freq: Four times a day (QID) | ORAL | 1 refills | Status: AC

## 2019-01-25 NOTE — Patient Instructions (Addendum)
increase carbidopa/levodopa 25/100, 7am/11am/3pm/7pm  You have been referred to Neuro Rehab for therapy. They will call you directly to schedule an appointment.  Please call [phone] if you do not hear from them.   The physicians and staff at [facility] are committed to providing excellent care. You may receive a survey requesting feedback about your experience at our office. We strive to receive "very good" responses to the survey questions. If you feel that your experience would prevent you from giving the office a "very good " response, please contact our office to try to remedy the situation. We may be reached at [phone]. Thank you for taking the time out of your busy day to complete the survey.

## 2019-01-27 ENCOUNTER — Ambulatory Visit (INDEPENDENT_AMBULATORY_CARE_PROVIDER_SITE_OTHER): Payer: Medicare HMO | Admitting: Podiatry

## 2019-01-27 ENCOUNTER — Other Ambulatory Visit: Payer: Self-pay

## 2019-01-27 DIAGNOSIS — M7752 Other enthesopathy of left foot: Secondary | ICD-10-CM | POA: Diagnosis not present

## 2019-01-27 DIAGNOSIS — M7989 Other specified soft tissue disorders: Secondary | ICD-10-CM

## 2019-01-27 DIAGNOSIS — L601 Onycholysis: Secondary | ICD-10-CM

## 2019-01-27 DIAGNOSIS — L84 Corns and callosities: Secondary | ICD-10-CM | POA: Diagnosis not present

## 2019-01-27 NOTE — Progress Notes (Signed)
Subjective: 73 year old male presents the office today for concerns of a callus to the right foot as well as the first right fifth toenail be trimmed this is loose and starting to come off.  Also he continues to get recurrent swelling to his left foot on the bottom of the foot.  MRI done previously.  Try to contact him with the results are not able to reach him.Denies any systemic complaints such as fevers, chills, nausea, vomiting. No acute changes since last appointment, and no other complaints at this time.   Objective: AAO x3, NAD DP/PT pulses palpable bilaterally, CRT less than 3 seconds Right fifth digit toenail is loose and smaller clear drainage expressed.  Upon debridement the entire nail did come off.  Nailbed appears to be healthy there is no purulence.  There is no erythema, ascending cellulitis.  No fluctuation crepitation.  No malodor.  Hyperkeratotic lesion right foot around the area the previous scar plantarly.  No ongoing ulceration drainage or any signs of infection. On the plantar aspect the left foot along the fifth metatarsal base on the midfoot is what appears to be a large bursa.  There is no fluctuation crepitation.  No erythema.  Subjectively gets discomfort in this area when he first gets up.  No pain with calf compression, swelling, warmth, erythema  Assessment: Bursitis left foot, onycholysis right fifth digit toenail, hyperkeratotic lesion  Plan: -All treatment options discussed with the patient including all alternatives, risks, complications.  -I debrided the right fifth digit toenail complications except the nail did come off,.  Recommend small amount of antibiotic ointment dressing changes daily.  Monitor for any signs or symptoms of infection -Debrided hyperkeratotic lesion x1 without any complications or bleeding. -On the left foot I prepped the skin with Betadine, alcohol.  Using 14-gauge needle in order to try to aspirate the area.  Small meta bloody drainage  expressed but there is no purulence.  1 cc Kenalog 10, 0.5 cc of Marcaine plain, 0.5 cc of lidocaine plain was infiltrated into the area without complications.  Compression bandage applied.  Post procedure instructions discussed. -Patient encouraged to call the office with any questions, concerns, change in symptoms.   Return in about 3 months (around 04/28/2019).  Trula Slade DPM

## 2019-01-31 ENCOUNTER — Ambulatory Visit: Payer: Medicare HMO | Admitting: Cardiology

## 2019-02-04 ENCOUNTER — Other Ambulatory Visit: Payer: Self-pay | Admitting: Cardiology

## 2019-02-07 DIAGNOSIS — M545 Low back pain: Secondary | ICD-10-CM | POA: Diagnosis not present

## 2019-02-07 DIAGNOSIS — R030 Elevated blood-pressure reading, without diagnosis of hypertension: Secondary | ICD-10-CM | POA: Diagnosis not present

## 2019-02-07 DIAGNOSIS — Z6830 Body mass index (BMI) 30.0-30.9, adult: Secondary | ICD-10-CM | POA: Diagnosis not present

## 2019-02-07 DIAGNOSIS — M5416 Radiculopathy, lumbar region: Secondary | ICD-10-CM | POA: Diagnosis not present

## 2019-02-10 DIAGNOSIS — M5416 Radiculopathy, lumbar region: Secondary | ICD-10-CM | POA: Diagnosis not present

## 2019-02-15 ENCOUNTER — Ambulatory Visit: Payer: Medicare HMO | Admitting: Cardiology

## 2019-02-15 ENCOUNTER — Other Ambulatory Visit: Payer: Self-pay

## 2019-02-15 ENCOUNTER — Encounter: Payer: Self-pay | Admitting: Cardiology

## 2019-02-15 VITALS — BP 150/94 | HR 78 | Ht 71.0 in | Wt 210.4 lb

## 2019-02-15 DIAGNOSIS — Z01812 Encounter for preprocedural laboratory examination: Secondary | ICD-10-CM | POA: Diagnosis not present

## 2019-02-15 DIAGNOSIS — I4819 Other persistent atrial fibrillation: Secondary | ICD-10-CM | POA: Diagnosis not present

## 2019-02-15 LAB — CBC
Hematocrit: 43.4 % (ref 37.5–51.0)
Hemoglobin: 14.7 g/dL (ref 13.0–17.7)
MCH: 30.6 pg (ref 26.6–33.0)
MCHC: 33.9 g/dL (ref 31.5–35.7)
MCV: 90 fL (ref 79–97)
Platelets: 217 10*3/uL (ref 150–450)
RBC: 4.8 x10E6/uL (ref 4.14–5.80)
RDW: 12.8 % (ref 11.6–15.4)
WBC: 7.5 10*3/uL (ref 3.4–10.8)

## 2019-02-15 LAB — BASIC METABOLIC PANEL
BUN/Creatinine Ratio: 20 (ref 10–24)
BUN: 21 mg/dL (ref 8–27)
CO2: 22 mmol/L (ref 20–29)
Calcium: 9.1 mg/dL (ref 8.6–10.2)
Chloride: 103 mmol/L (ref 96–106)
Creatinine, Ser: 1.06 mg/dL (ref 0.76–1.27)
GFR calc Af Amer: 81 mL/min/{1.73_m2} (ref >59)
GFR calc non Af Amer: 70 mL/min/{1.73_m2} (ref >59)
Glucose: 96 mg/dL (ref 65–99)
Potassium: 4.7 mmol/L (ref 3.5–5.2)
Sodium: 138 mmol/L (ref 134–144)

## 2019-02-15 MED ORDER — FLECAINIDE ACETATE 100 MG PO TABS: 100.0000 mg | ORAL_TABLET | Freq: Two times a day (BID) | ORAL | 1 refills | Status: DC

## 2019-02-15 MED ORDER — CARVEDILOL 12.5 MG PO TABS: 12.5000 mg | ORAL_TABLET | Freq: Two times a day (BID) | ORAL | 3 refills | Status: AC

## 2019-02-15 NOTE — Progress Notes (Signed)
Electrophysiology Office Note   Date:  02/15/2019   ID:  Roshawn Barbin, Alferd Apa [birth year], MRN [id number]  PCP:  Park Liter, MD  Cardiologist:  Agustin Cree Primary Electrophysiologist:  Sylena Lotter Meredith Leeds, MD    Chief Complaint: AF   History of Present Illness: Samuel Moran is a 73 y.o. male who is being seen today for the evaluation of AF at the request of Park Liter, MD. Presenting today for electrophysiology evaluation.  History significant for persistent atrial fibrillation, Parkinson's disease, seizures.  Atrial fibrillation began March 2019.  At that time, he was cardioverted, but quickly went back into atrial fibrillation.  He was started on low-dose flecainide and cardioverted back to sinus rhythm.  He remained in sinus rhythm for a few days, but again went back into atrial fibrillation.  Today, he denies symptoms of palpitations, chest pain, shortness of breath, orthopnea, PND, lower extremity edema, claudication, dizziness, presyncope, syncope, bleeding, or neurologic sequela. The patient is tolerating medications without difficulties.  He was initially diagnosed with atrial fibrillation, he had quite a bit of weakness and fatigue.  Over time, he has felt better with less of this.  He does state that after his most recent cardioversion, he did have less weakness and fatigue.   Past Medical History:  Diagnosis Date  . Atrial fibrillation ([place])    onset 07/17/17  . Dysrhythmia   . ED (erectile dysfunction)   . GERD 07/01/2007  . Parkinson's disease ([place])   . 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 PROSTATE SPECIFIC ANTIGEN, ELEVATED 06/29/2007  . Rheumatoid arthritis ([place])   . SEIZURE DISORDER 07/01/2007   one epsiode only  . Spinal stenosis of lumbar region 09/22/2014   Past Surgical History:  Procedure Laterality Date  . AMPUTATION     right great toe - tramatic age 46  . CARDIOVERSION N/A 03/15/2018   Procedure: CARDIOVERSION;  Surgeon: [name], MD;  Location: [facility] ENDOSCOPY;  Service: Cardiovascular;  Laterality: N/A;  . CARDIOVERSION N/A 12/13/2018   Procedure: CARDIOVERSION;  Surgeon: Sanda Klein, MD;  Location: [facility] ENDOSCOPY;  Service: Cardiovascular;  Laterality: N/A;  . COLONOSCOPY    . DEBRIDEMENT TOE Right 07/2016  . HAND SURGERY  03/2017   removal cysts  . IR FLUORO GUIDE CV LINE RIGHT  10/16/2016  . IR US GUIDE VASC ACCESS RIGHT  10/16/2016  . OSTECTOMY Right 08/05/2017   Procedure: OSTECTOMY COMPLETE METARSAL HEAD FIRST RIGHT;  Surgeon: Trula Slade, DPM;  Location: [facility];  Service: Podiatry;  Laterality: Right;     Current Outpatient Medications  Medication Sig Dispense Refill  . B Complex-C (SUPER B COMPLEX PO) Take 1 tablet by mouth every evening.    . carbidopa-levodopa (SINEMET IR) 25-100 MG tablet Take 1 tablet by mouth 4 (four) times daily. Take at 7am/11am/3pm/7pm 360 tablet 1  . ELIQUIS 5 MG TABS tablet TAKE 1 TABLET BY MOUTH TWICE A DAY 60 tablet 6  . ibuprofen (ADVIL,MOTRIN) 200 MG tablet Take 800 mg by mouth 2 (two) times daily as needed (for pain/headaches.).     Marland Kitchen leflunomide (ARAVA) 20 MG tablet Take 20 mg by mouth daily.    . Multiple Vitamin (MULTIVITAMIN WITH MINERALS) TABS tablet Take 1 tablet by mouth daily. Centrum Silver    . NEUPRO 4 MG/24HR APPLY 1 PATCH ONTO THE SKIN EVERY DAY 30 patch 5  . Omega-3 Fatty Acids (FISH OIL) 1200 MG CPDR Take 1,200 mg by mouth daily.    Marland Kitchen omeprazole (  PRILOSEC) 20 MG capsule Take 20 mg by mouth daily.     . carvedilol (COREG) 12.5 MG tablet Take 1 tablet (12.5 mg total) by mouth 2 (two) times daily. 60 tablet 3  . flecainide (TAMBOCOR) 100 MG tablet Take 1 tablet (100 mg total) by mouth 2 (two) times daily. 180 tablet 1   No current facility-administered medications for this visit.     Allergies:   Patient has no known allergies.   Social History:  The patient  reports that he has been smoking cigars. He has never used smokeless tobacco. He reports  current alcohol use of about 4.0 standard drinks of alcohol per week. He reports that he does not use drugs.   Family History:  The patient's family history includes Alcoholism in his mother; Arthritis in his father and mother.    ROS:  Please see the history of present illness.   Otherwise, review of systems is positive for none.   All other systems are reviewed and negative.    PHYSICAL EXAM: VS:  BP (!) 150/94   Pulse 78   Ht 5\' 11"  (1.803 m)   Wt 210 lb 6.4 oz (95.4 kg)   SpO2 98%   BMI 29.34 kg/m  , BMI Body mass index is 29.34 kg/m. GEN: Well nourished, well developed, in no acute distress  HEENT: normal  Neck: no JVD, carotid bruits, or masses Cardiac: iRRR; no murmurs, rubs, or gallops,no edema  Respiratory:  clear to auscultation bilaterally, normal work of breathing GI: soft, nontender, nondistended, + BS MS: no deformity or atrophy  Skin: warm and dry Neuro:  Strength and sensation are intact Psych: euthymic mood, full affect  EKG:  EKG is ordered today. Personal review of the ekg ordered shows fibrillation, rate 78  Recent Labs: 12/02/2018: BUN 16; Creatinine, Ser 0.83; Hemoglobin 14.1; Platelets 222; Potassium 4.3; Sodium 141    Lipid Panel     Component Value Date/Time   CHOL 168 02/26/2017 0922   TRIG 55.0 02/26/2017 0922   HDL 52.90 02/26/2017 0922   CHOLHDL 3 02/26/2017 0922   VLDL 11.0 02/26/2017 0922   LDLCALC 104 (H) 02/26/2017 0922     Wt Readings from Last 3 Encounters:  02/15/19 210 lb 6.4 oz (95.4 kg)  01/25/19 217 lb (98.4 kg)  12/31/18 213 lb 6.4 oz (96.8 kg)      Other studies Reviewed: Additional studies/ records that were reviewed today include: Myoview 04/12/18  Review of the above records today demonstrates:   Nuclear stress EF: 59%.  No T wave inversion was noted during stress.  There was no ST segment deviation noted during stress.  The study is normal.  This is a low risk study.  TTE 07/27/17 - Left ventricle: The  cavity size was normal. Wall thickness was   increased in a pattern of moderate LVH. Indeterminant diastolic   function (atrial fibrillation). Systolic function was normal. The   estimated ejection fraction was in the range of 55% to 60%. Wall   motion was normal; there were no regional wall motion   abnormalities. - Aortic valve: There was no stenosis. - Mitral valve: There was trivial regurgitation. - Left atrium: The atrium was moderately dilated. - Right ventricle: The cavity size was normal. Systolic function   was normal. - Right atrium: The atrium was mildly dilated. - Tricuspid valve: Peak RV-RA gradient (S): 31 mm Hg. - Pulmonary arteries: PA peak pressure: 39 mm Hg (S). - Systemic veins: IVC measured 2.2  with > 50% respirophasic   variation, suggesting RA pressure 8 mmHg.  Monitor 10/22/17 personally reviewed Baseline rhythm: Atrial fibrillation Minimum heart rate: 55 BPM.  Average heart rate: 87 BPM.  Maximal heart rate 115 BPM. Atrial arrhythmia: Atrial fibrillation alter the recordin Ventricular arrhythmia: 622 premature ventricular beats with 2 ventricular couplets  ASSESSMENT AND PLAN:  1.  Persistent atrial fibrillation: Currently on Eliquis, flecainide, and metoprolol.  He has failed cardioversion multiple times with low-dose flecainide.  I discussed options of increasing flecainide, dofetilide, or ablation.  Risks and benefits of ablation were discussed include bleeding, tamponade, heart block, stroke, damage surrounding organs.  At this point, he would like to try increasing the dose of his flecainide.  We Magdalynn Davilla increase it to 100 mg twice a day.  We Kyerra Vargo also schedule for cardioversion.  I Mazzy Santarelli see him back after his cardioversion for further discussions of rhythm control.  This patients CHA2DS2-VASc Score and unadjusted Ischemic Stroke Rate (% per year) is equal to 2.2 % stroke rate/year from a score of 2  Above score calculated as 1 point each if present [CHF, HTN,  DM, Vascular=MI/PAD/Aortic Plaque, Age if 65-74, or Male] Above score calculated as 2 points each if present [Age > 75, or Stroke/TIA/TE]  2.  Hypertension: Mildly elevated today.  Has also been elevated on prior checks.  We Sanye Ledesma stop his metoprolol and start carvedilol.  He sees his primary cardiologist tomorrow who can make further adjustments to blood pressure meds.  3.  Obstructive sleep apnea: CPAP compliance encouraged  Current medicines are reviewed at length with the patient today.   The patient does not have concerns regarding his medicines.  The following changes were made today: Increase flecainide, stop Toprol-XL, start carvedilol 12.5 mg  Labs/ tests ordered today include:  Orders Placed This Encounter  Procedures  . Basic metabolic panel  . CBC  . EKG 12-Lead   Case discussed with referring cardiologist  Disposition:   FU with Clela Hagadorn 3 months  Signed, Aneli Zara Meredith Leeds, MD  02/15/2019 10:49 AM     [facility] [address] [place] [address] [phone] (office) [phone] (fax)

## 2019-02-15 NOTE — Patient Instructions (Addendum)
Medication Instructions:  Your physician has recommended you make the following change in your medication:  1. STOP Metoprolol 2. START Carvedilol 12.5 mg twice a day 3. INCREASE Flecainide to 100 mg twice a day  * If you need a refill on your cardiac medications before your next appointment, please call your pharmacy.   Labwork: Pre procedure labs today: BMET & CBC If you have labs (blood work) drawn today and your tests are completely normal, you will receive your results only by:  MyChart Message (if you have MyChart) OR  A paper copy in the mail If you have any lab test that is abnormal or we need to change your treatment, we will call you to review the results.  Testing/Procedures: Your physician has recommended that you have a Cardioversion (DCCV). Electrical Cardioversion uses a jolt of electricity to your heart either through paddles or wired patches attached to your chest. This is a controlled, usually prescheduled, procedure. Defibrillation is done under light anesthesia in the hospital, and you usually go home the day of the procedure. This is done to get your heart back into a normal rhythm. You are not awake for the procedure. Please see the instructions below.  Follow-Up: You are scheduled for follow-up appointment in: 1 month in the AFib clinic. This is scheduled for 04/04/19 @ 10:00 am  You are scheduled for follow up with Dr. Curt Bears on 05/30/2019 @ 9:30 am.  Thank you for choosing [facility]!!   Trinidad Curet, RN [phone]  Any Other Special Instructions Will Be Listed Below (If Applicable).   COVID TEST-- On 02/22/19 @ 3:00 pm- You will go to [facility] hospital ([place]) for your Covid testing.   This is a drive thru test site.  There will be multiple testing areas.  Be sure to share with the first checkpoint that you are there for pre-procedure/surgery testing. This will put you into the right (yellow) lane that leads to the PAT  testing team. Stay in your car and the nurse team will come to your car to test you.  After you are tested please go home and self quarantine until the day of your procedure.                             CARDIOVERSION INSTRUCTIONS Please arrive at the [facility] main entrance of [facility] hospital on 02/25/19 at  9:00 am Do not eat or drink after midnight prior to procedure Take your medications as normal the morning of your procedure with a sip of water. You will need someone to drive you home at discharge

## 2019-02-16 ENCOUNTER — Ambulatory Visit (INDEPENDENT_AMBULATORY_CARE_PROVIDER_SITE_OTHER): Payer: Medicare HMO | Admitting: Cardiology

## 2019-02-16 ENCOUNTER — Encounter: Payer: Self-pay | Admitting: Cardiology

## 2019-02-16 VITALS — BP 150/82 | HR 88 | Ht 71.0 in | Wt 215.8 lb

## 2019-02-16 DIAGNOSIS — I4819 Other persistent atrial fibrillation: Secondary | ICD-10-CM | POA: Diagnosis not present

## 2019-02-16 DIAGNOSIS — G20 Parkinson's disease: Secondary | ICD-10-CM

## 2019-02-16 DIAGNOSIS — I10 Essential (primary) hypertension: Secondary | ICD-10-CM

## 2019-02-16 NOTE — Patient Instructions (Signed)
Medication Instructions:  Your physician recommends that you continue on your current medications as directed. Please refer to the Current Medication list given to you today.  If you need a refill on your cardiac medications before your next appointment, please call your pharmacy.   Lab work: None.  If you have labs (blood work) drawn today and your tests are completely normal, you will receive your results only by: Marland Kitchen MyChart Message (if you have MyChart) OR . A paper copy in the mail If you have any lab test that is abnormal or we need to change your treatment, we will call you to review the results.  Testing/Procedures: None.   Follow-Up: At [facility], you and your health needs are our priority.  As part of our continuing mission to provide you with exceptional heart care, we have created designated Provider Care Teams.  These Care Teams include your primary Cardiologist (physician) and Advanced Practice Providers (APPs -  Physician Assistants and Nurse Practitioners) who all work together to provide you with the care you need, when you need it. You will need a follow up appointment in 2 and a half  months.  Please call our office 2 months in advance to schedule this appointment.  You may see Jenne Campus, MD or another member of our [facility] Provider Team in [place]: Shirlee More, MD . Jyl Heinz, MD  Any Other Special Instructions Will Be Listed Below (If Applicable).

## 2019-02-16 NOTE — Progress Notes (Signed)
Cardiology Office Note:    Date:  02/16/2019   ID:  Samuel Moran, DOB Jun 12, 1945, MRN [id number]  PCP:  Park Liter, MD  Cardiologist:  Jenne Campus, MD    Referring MD: Park Liter, MD   Chief Complaint  Patient presents with  . Follow-up  Doing fine  History of Present Illness:    Samuel Moran is a 73 y.o. male with persistent atrial fibrillation.  He did see Dr. Curt Bears yesterday decision has been made to increase dose of flecainide, he will have cardioversion done within next 2 weeks.  Also his metoprolol has been changed to carvedilol.  Since that time he is doing well denies have any chest pain tightness squeezing pressure been chest no palpitations.  He is looking forward towards his cardioversion.  He also had a discussion about atrial fibrillation ablation and we revisit that issue as well.  Past Medical History:  Diagnosis Date  . Atrial fibrillation ([place])    onset 07/17/17  . Dysrhythmia   . ED (erectile dysfunction)   . GERD 07/01/2007  . Parkinson's disease ([place])   . 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 PROSTATE SPECIFIC ANTIGEN, ELEVATED 06/29/2007  . Rheumatoid arthritis ([place])   . SEIZURE DISORDER 07/01/2007   one epsiode only  . Spinal stenosis of lumbar region 09/22/2014    Past Surgical History:  Procedure Laterality Date  . AMPUTATION     right great toe - tramatic age 63  . CARDIOVERSION N/A 03/15/2018   Procedure: CARDIOVERSION;  Surgeon: [name], MD;  Location: [facility] ENDOSCOPY;  Service: Cardiovascular;  Laterality: N/A;  . CARDIOVERSION N/A 12/13/2018   Procedure: CARDIOVERSION;  Surgeon: Sanda Klein, MD;  Location: [facility] ENDOSCOPY;  Service: Cardiovascular;  Laterality: N/A;  . COLONOSCOPY    . DEBRIDEMENT TOE Right 07/2016  . HAND SURGERY  03/2017   removal cysts  . IR FLUORO GUIDE CV LINE RIGHT  10/16/2016  . IR US GUIDE VASC ACCESS RIGHT  10/16/2016  . OSTECTOMY Right 08/05/2017   Procedure: OSTECTOMY COMPLETE  METARSAL HEAD FIRST RIGHT;  Surgeon: Trula Slade, DPM;  Location: [facility];  Service: Podiatry;  Laterality: Right;    Current Medications: Current Meds  Medication Sig  . B Complex-C (SUPER B COMPLEX PO) Take 1 tablet by mouth every evening.  . carbidopa-levodopa (SINEMET IR) 25-100 MG tablet Take 1 tablet by mouth 4 (four) times daily. Take at 7am/11am/3pm/7pm  . carvedilol (COREG) 12.5 MG tablet Take 1 tablet (12.5 mg total) by mouth 2 (two) times daily.  Marland Kitchen ELIQUIS 5 MG TABS tablet TAKE 1 TABLET BY MOUTH TWICE A DAY  . flecainide (TAMBOCOR) 100 MG tablet Take 1 tablet (100 mg total) by mouth 2 (two) times daily.  Marland Kitchen ibuprofen (ADVIL,MOTRIN) 200 MG tablet Take 800 mg by mouth 2 (two) times daily as needed (for pain/headaches.).   Marland Kitchen leflunomide (ARAVA) 20 MG tablet Take 20 mg by mouth daily.  . Multiple Vitamin (MULTIVITAMIN WITH MINERALS) TABS tablet Take 1 tablet by mouth daily. Centrum Silver  . NEUPRO 4 MG/24HR APPLY 1 PATCH ONTO THE SKIN EVERY DAY  . Omega-3 Fatty Acids (FISH OIL) 1200 MG CPDR Take 1,200 mg by mouth daily.  Marland Kitchen omeprazole (PRILOSEC) 20 MG capsule Take 20 mg by mouth daily.      Allergies:   Patient has no known allergies.   Social History   Socioeconomic History  . Marital status: Single    Spouse name: Not on file  .  Number of children: 0  . Years of education: Not on file  . Highest education level: High school graduate  Occupational History  . Occupation: Chartered certified accountant  Social Needs  . Financial resource strain: Not on file  . Food insecurity    Worry: Not on file    Inability: Not on file  . Transportation needs    Medical: Not on file    Non-medical: Not on file  Tobacco Use  . Smoking status: Light Tobacco Smoker    Types: Cigars  . Smokeless tobacco: Never Used  . Tobacco comment: 1 cigar, takes 3 weeks  Substance and Sexual Activity  . Alcohol use: Yes    Alcohol/week: 4.0 standard drinks    Types: 4 Standard drinks or equivalent per  week    Comment: one drink a day (liquor)  . Drug use: No  . Sexual activity: Not on file  Lifestyle  . Physical activity    Days per week: Not on file    Minutes per session: Not on file  . Stress: Not on file  Relationships  . Social Herbalist on phone: Not on file    Gets together: Not on file    Attends religious service: Not on file    Active member of club or organization: Not on file    Attends meetings of clubs or organizations: Not on file    Relationship status: Not on file  Other Topics Concern  . Not on file  Social History Narrative  . Not on file     Family History: The patient's family history includes Alcoholism in his mother; Arthritis in his father and mother. There is no history of Colon cancer, Esophageal cancer, Rectal cancer, or Stomach cancer. ROS:   Please see the history of present illness.    All 14 point review of systems negative except as described per history of present illness  EKGs/Labs/Other Studies Reviewed:      Recent Labs: 02/15/2019: BUN 21; Creatinine, Ser 1.06; Hemoglobin 14.7; Platelets 217; Potassium 4.7; Sodium 138  Recent Lipid Panel    Component Value Date/Time   CHOL 168 02/26/2017 0922   TRIG 55.0 02/26/2017 0922   HDL 52.90 02/26/2017 0922   CHOLHDL 3 02/26/2017 0922   VLDL 11.0 02/26/2017 0922   LDLCALC 104 (H) 02/26/2017 0922    Physical Exam:    VS:  BP (!) 150/82   Pulse 88   Ht 5\' 11"  (1.803 m)   Wt 215 lb 12.8 oz (97.9 kg)   SpO2 95%   BMI 30.10 kg/m     Wt Readings from Last 3 Encounters:  02/16/19 215 lb 12.8 oz (97.9 kg)  02/15/19 210 lb 6.4 oz (95.4 kg)  01/25/19 217 lb (98.4 kg)     GEN:  Well nourished, well developed in no acute distress HEENT: Normal NECK: No JVD; No carotid bruits LYMPHATICS: No lymphadenopathy CARDIAC: RRR, no murmurs, no rubs, no gallops RESPIRATORY:  Clear to auscultation without rales, wheezing or rhonchi  ABDOMEN: Soft, non-tender, non-distended  MUSCULOSKELETAL:  No edema; No deformity  SKIN: Warm and dry LOWER EXTREMITIES: no swelling NEUROLOGIC:  Alert and oriented x 3 PSYCHIATRIC:  Normal affect   ASSESSMENT:    1. Other persistent atrial fibrillation ([place])   2. Essential hypertension   3. Parkinson disease ([place])    PLAN:    In order of problems listed above:  1. Persistent atrial fibrillation plan is to cardiovert him electrically within 2  weeks after increasing dose of flecainide.  Continue anticoagulation 2. Essential hypertension blood pressure slightly elevated today however he said at home it is good we will continue monitoring 3. Parkinson's disease.  Not at present followed by neurology   Medication Adjustments/Labs and Tests Ordered: Current medicines are reviewed at length with the patient today.  Concerns regarding medicines are outlined above.  No orders of the defined types were placed in this encounter.  Medication changes: No orders of the defined types were placed in this encounter.   Signed, Park Liter, MD, [facility] 02/16/2019 4:37 PM    [place]

## 2019-02-17 ENCOUNTER — Telehealth: Payer: Self-pay | Admitting: *Deleted

## 2019-02-17 ENCOUNTER — Ambulatory Visit: Payer: Medicare HMO | Attending: Neurology | Admitting: Physical Therapy

## 2019-02-17 ENCOUNTER — Other Ambulatory Visit: Payer: Self-pay

## 2019-02-17 ENCOUNTER — Ambulatory Visit: Payer: Medicare HMO | Admitting: Cardiology

## 2019-02-17 DIAGNOSIS — R2689 Other abnormalities of gait and mobility: Secondary | ICD-10-CM | POA: Diagnosis not present

## 2019-02-17 DIAGNOSIS — M6281 Muscle weakness (generalized): Secondary | ICD-10-CM | POA: Diagnosis not present

## 2019-02-17 DIAGNOSIS — R2681 Unsteadiness on feet: Secondary | ICD-10-CM

## 2019-02-17 DIAGNOSIS — R293 Abnormal posture: Secondary | ICD-10-CM | POA: Diagnosis not present

## 2019-02-17 NOTE — Patient Instructions (Addendum)
Hip Flexor Stretch    Lying on back near edge of bed, bend one leg, foot flat. Hang other leg over edge, relaxed, thigh resting entirely off the edge of the  bed for __30-60__ secondse. Repeat _3__ times. Do __1-2__ sessions per day.   http://gt2.exer.us/346   Copyright  VHI. All rights reserved.  HIP: Hamstrings - Supine    Place strap around foot. Raise leg up, keep knee straight. Hold _30__ seconds. _3__ reps per set, _1-2__ sets per day Copyright  VHI. All rights reserved.

## 2019-02-17 NOTE — Telephone Encounter (Signed)
While giving pt lab results Pt complained of feeling lightheaded and noticed this since med changes Encouraged pt to check B/P and HR during these episodes to see if vitals are to low Per pt had not checked but will starting today and will call back if no improvement.Will forward to Dr Curt Bears for review .Adonis Housekeeper

## 2019-02-18 NOTE — Telephone Encounter (Signed)
If he feels poorly on higher dose of flecainide, can switch to [place] but Kyrstal Monterrosa need 3 days in the hospital for loading. OK for ablation as well or continue flecainide at 50 mg and see if he is improved. Unlikely to stay in sinus with flecainide 50 mg.

## 2019-02-18 NOTE — Therapy (Signed)
[facility] [address] [place], [place], [address] Phone: [phone]   Fax:  [phone]  Physical Therapy Evaluation  Patient Details  Name: Samuel Moran MRN: [id number] Date of Birth: 11-08-1945 Referring Provider (PT): Tat, Wells Guiles   Encounter Date: 02/17/2019  PT End of Session - 02/18/19 0706    Visit Number  1    Number of Visits  9    Date for PT Re-Evaluation  04/19/19    Authorization Type  [employer] Medicare-will need 10th visit progress note    PT Start Time  0803    PT Stop Time  0845    PT Time Calculation (min)  42 min    Equipment Utilized During Treatment  Gait belt    Activity Tolerance  Patient tolerated treatment well   Hip pain increases with bouts of sitting during eval   Behavior During Therapy  [facility] for tasks assessed/performed       Past Medical History:  Diagnosis Date  . Atrial fibrillation ([place])    onset 07/17/17  . Dysrhythmia   . ED (erectile dysfunction)   . GERD 07/01/2007  . Parkinson's disease ([place])   . 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 PROSTATE SPECIFIC ANTIGEN, ELEVATED 06/29/2007  . Rheumatoid arthritis ([place])   . SEIZURE DISORDER 07/01/2007   one epsiode only  . Spinal stenosis of lumbar region 09/22/2014    Past Surgical History:  Procedure Laterality Date  . AMPUTATION     right great toe - tramatic age 44  . CARDIOVERSION N/A 03/15/2018   Procedure: CARDIOVERSION;  Surgeon: [name], MD;  Location: [facility] ENDOSCOPY;  Service: Cardiovascular;  Laterality: N/A;  . CARDIOVERSION N/A 12/13/2018   Procedure: CARDIOVERSION;  Surgeon: Sanda Klein, MD;  Location: [facility] ENDOSCOPY;  Service: Cardiovascular;  Laterality: N/A;  . COLONOSCOPY    . DEBRIDEMENT TOE Right 07/2016  . HAND SURGERY  03/2017   removal cysts  . IR FLUORO GUIDE CV LINE RIGHT  10/16/2016  . IR US GUIDE VASC ACCESS RIGHT  10/16/2016  . OSTECTOMY Right 08/05/2017   Procedure: OSTECTOMY COMPLETE  METARSAL HEAD FIRST RIGHT;  Surgeon: Trula Slade, DPM;  Location: [facility];  Service: Podiatry;  Laterality: Right;    There were no vitals filed for this visit.   Subjective Assessment - 02/17/19 0805    Subjective  Saw Dr. Carles Collet last week and she recommended therapy.  R hip pain due to a pinched nerve, which is improving with injections.  Have not had any falls.  Feel more shaky on my feet with less rest or not eating well.  Using walking pole for stability for past 6 months.    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  limited by pain, 5-10 minutes    Patient Stated Goals  Want to get rid of hip pain    Currently in Pain?  Yes    Pain Score  1    at worst, 9/10   Pain Location  Leg   foot, hip   Pain Orientation  Right    Pain Descriptors / Indicators  Burning    Pain Type  Chronic pain   6 months   Pain Onset  More than a month ago    Pain Frequency  Intermittent    Aggravating Factors  Worse with prolonged sitting    Pain Relieving Factors  change of positions, injections    Effect of Pain on Daily Activities  PT will monitor, attempt to address with exercises, posture/positioning; pain is being addressed by MD         [facility] PT Assessment - 02/17/19 0811      Assessment   Medical Diagnosis  Parkinson's, back pain    Referring Provider (PT)  Tat, Wells Guiles    Onset Date/Surgical Date  01/25/19   MD visit   Hand Dominance  Right   R hand tremor>L     Precautions   Precautions  Fall      Balance Screen   Has the patient fallen in the past 6 months  No    Has the patient had a decrease in activity level because of a fear of falling?   No    Is the patient reluctant to leave their home because of a fear of falling?   No      Home Environment   Living Environment  Private residence    Living Arrangements   Alone    Type of [place] to enter    Entrance Stairs-Number of Steps  3    Entrance Stairs-Rails  Right    Home Layout  One level    [place]  --   Walking pole     Prior Function   Level of Independence  Independent    Leisure  Does push-ups, sit-ups, leg weights  (Goes to [employer] at [facility])      Posture/Postural Control   Posture/Postural Control  Postural limitations    Postural Limitations  Rounded Shoulders;Forward head      ROM / Strength   AROM / PROM / Strength  Strength      Strength   Overall Strength  Deficits    Overall Strength Comments  Grossly tested at least 4+/5 BLES; exception of R and L hip abductor strength 3+/5      Transfers   Transfers  Sit to Stand;Stand to Sit    Sit to Stand  6: Modified independent (Device/Increase time);Without upper extremity assist;From chair/3-in-1    Five time sit to stand comments   14.03    Stand to Sit  6: Modified independent (Device/Increase time);Without upper extremity assist;To chair/3-in-1    Comments  Initial repositioning of feet upon standing      Ambulation/Gait   Ambulation/Gait  Yes    Ambulation/Gait Assistance  6: Modified independent (Device/Increase time)    Ambulation Distance (Feet)  200 Feet    Assistive device  Other (Comment)   single walking pole   Gait Pattern  Step-through pattern;Antalgic;Lateral trunk lean to right;Lateral trunk lean to left;Decreased trunk rotation    Ambulation Surface  Level;Indoor    Gait velocity  10.25 sec = 3.2 ft/sec    Gait velocity - backwards         Standardized Balance Assessment   Standardized Balance Assessment  Timed Up and Go Test;Dynamic Gait Index      Dynamic Gait Index   Level Surface  Mild Impairment    Change in Gait Speed  Normal    Gait with Horizontal Head Turns  Mild Impairment    Gait with Vertical Head Turns  Mild Impairment    Gait and Pivot Turn  Normal    Step Over Obstacle  Mild Impairment     Step  Around Obstacles  Mild Impairment    Steps  Mild Impairment    Total Score  18    DGI comment:  Scores <19/24 indicate increased fall risk      Timed Up and Go Test   Normal TUG (seconds)  11.72    Manual TUG (seconds)  12.94    Cognitive TUG (seconds)  12.69    TUG Comments  Scores >13.5-15 seconds indicate increased fall risk                Objective measurements completed on examination: See above findings.          Pt performed supine hip flexor stretch, RLE 30 seconds; educated pt on avoiding overarching low back to have both legs hanging over edge of mat (and using trunk flexion to get in and out of that position), as it puts his back in unfavorable position.  Pt performs SLR hamstring stretch, each leg 2 reps x 15 seconds each, using sheet.    PT Education - 02/18/19 0704    Education Details  Educated in objective measures from evaluation; initiated HEP with stretches for low back/hip    Person(s) Educated  Patient    Methods  Explanation;Demonstration;Handout    Comprehension  Verbalized understanding;Returned demonstration          PT Long Term Goals - 02/18/19 0925      PT LONG TERM GOAL #1   Title  Pt will be independent with HEP to address Parkinson's deficits, pain, balance.    Time  4    Period  Weeks    Status  New    Target Date  03/18/19      PT LONG TERM GOAL #2   Title  Pt will perform 5x sit<>stand in less than or equal to 13 sec or improved transfer efficiency and strength.    Time  4    Period  Weeks    Status  New    Target Date  03/18/19      PT LONG TERM GOAL #3   Title  Pt will improve DGI score to at least 20/24 for decreased fall risk.    Time  4    Period  Weeks    Status  New    Target Date  03/18/19      PT LONG TERM GOAL #4   Title  Pt will verbalize understanding of fall prevention in the home environment.    Time  4    Period  Weeks    Status  New    Target Date  03/18/19             Plan -  02/18/19 0707    Clinical Impression Statement  Pt is a 73 year old male who presents to OPPT with history of Parkinson's disease and back/hip pain.  Pt presents with abnormal posture, decreased timing and coordination of gait, decreased functional lower extremity strength, decreased balance, ridigity/tremors.  Pt is at risk of falls per DGI score; he does not have a current exercise program to address Parkinson's deficits.  He will benefit from skilled PT to address the above stated deficits for improved overall mobility and decreased fall risk.    Personal Factors and Comorbidities  Comorbidity 3+    Comorbidities  See PMH-PD, A-fib, great toe amputation RLE    Examination-Activity Limitations  Sit;Stairs;Transfers;Locomotion Level    Examination-Participation Restrictions  Community Activity   Return to gym activities   Stability/Clinical Decision Making  Evolving/Moderate complexity    Clinical Decision Making  Moderate    Rehab Potential  Good    PT Frequency  2x / week    PT Duration  4 weeks   plus eval; total POC = 5 weeks   PT Treatment/Interventions  ADLs/Self Care Home Management;Stair training;Functional mobility training;Therapeutic activities;Therapeutic exercise;Balance training;Neuromuscular re-education;Gait training;DME Instruction;Patient/family education;Manual techniques    PT Next Visit Plan  Review stretches given as HEP; work on standing, modified quadruped PWR! Moves, emphasizing low back/hip flexibility, trunk rotation; hip strengthening    Consulted and Agree with Plan of Care  Patient       Patient will benefit from skilled therapeutic intervention in order to improve the following deficits and impairments:  Abnormal gait, Decreased balance, Decreased mobility, Decreased strength, Impaired flexibility, Postural dysfunction, Difficulty walking, Pain  Visit Diagnosis: Unsteadiness on feet  Other abnormalities of gait and mobility  Muscle weakness  (generalized)  Abnormal posture     Problem List Patient Active Problem List   Diagnosis Date Noted  . Obstructive sleep apnea 05/31/2018  . Full thickness rotator cuff tear 03/16/2018  . Scoliosis deformity of spine 03/02/2018  . Acute low back pain 12/18/2017  . Degeneration of lumbosacral intervertebral disc 10/05/2017  . HTN (hypertension) 09/21/2017  . Bilateral leg edema 09/21/2017  . Osteomyelitis of toe of right foot ([place]) 08/05/2017  . Rheumatoid arthritis, adult ([place]) 08/05/2017  . Parkinson disease ([place]) 08/05/2017  . HLD (hyperlipidemia) 08/05/2017  . Paroxysmal atrial fibrillation ([place]) 08/05/2017  . Normocytic anemia 08/05/2017  . Other persistent atrial fibrillation ([place]) 07/22/2017  . ED (erectile dysfunction) 04/20/2012  . GERD 07/01/2007  . SEIZURE DISORDER 07/01/2007  . Dyslipidemia 06/29/2007    Naiara Lombardozzi W. 02/18/2019, 12:17 PM  Mady Haagensen, PT 02/18/19 12:19 PM Phone: [phone] Fax: [facility] [address] [place], [place], [address] Phone: [phone]   Fax:  [phone]  Name: Gilman Cantey MRN: [id number] Date of Birth: 08-23-1945

## 2019-02-22 ENCOUNTER — Other Ambulatory Visit (HOSPITAL_COMMUNITY)
Admission: RE | Admit: 2019-02-22 | Discharge: 2019-02-22 | Disposition: A | Discharge status: Home / Self Care | Payer: Medicare HMO | Source: Ambulatory Visit | Attending: Internal Medicine | Admitting: Internal Medicine

## 2019-02-22 DIAGNOSIS — Z01812 Encounter for preprocedural laboratory examination: Secondary | ICD-10-CM | POA: Diagnosis not present

## 2019-02-22 DIAGNOSIS — Z20828 Contact with and (suspected) exposure to other viral communicable diseases: Secondary | ICD-10-CM | POA: Insufficient documentation

## 2019-02-23 LAB — NOVEL CORONAVIRUS, NAA (HOSP ORDER, SEND-OUT TO REF LAB; TAT 18-24 HRS): SARS-CoV-2, NAA: NOT DETECTED

## 2019-02-24 NOTE — Telephone Encounter (Signed)
Followed up w/ pt as I was out of the office last week when he called in reporting issues. Pt reports that he is experiencing issue only in the morning about an hour after taking his Flecainide. Yesterday he was going into [employer], got dizzy just walking to door and barely made it to front to find a place to sit down.  Denies syncope. BP this morning 155/91, but he thinks his cuff is malfunctioning. He stated that hospital called him today and told him to bring his new medication with him tomorrow and they would address complaints. Pt advised to do as instructed tomorrow, I will follow up to see if they advised him to make any changes and then discuss w/ [place].  Pt aware it may beginning of next week before being able to discuss w/ Camnitz. Advised to call over weekend if issue remains/re-starts.  Advised to decrease Flecainide to 50 mg BID if re-occurs and call next week to inform us and get updated advisement from [facility]. Patient verbalized understanding and agreeable to plan.

## 2019-02-25 ENCOUNTER — Ambulatory Visit (HOSPITAL_COMMUNITY)
Admission: RE | Admit: 2019-02-25 | Discharge: 2019-02-25 | Disposition: A | Discharge status: Home / Self Care | Payer: Medicare HMO | Attending: Internal Medicine | Admitting: Internal Medicine

## 2019-02-25 ENCOUNTER — Encounter (HOSPITAL_COMMUNITY): Payer: Self-pay | Admitting: Emergency Medicine

## 2019-02-25 ENCOUNTER — Telehealth: Payer: Self-pay | Admitting: Internal Medicine

## 2019-02-25 ENCOUNTER — Other Ambulatory Visit: Payer: Self-pay

## 2019-02-25 ENCOUNTER — Ambulatory Visit (HOSPITAL_COMMUNITY): Payer: Medicare HMO | Admitting: Certified Registered Nurse Anesthetist

## 2019-02-25 ENCOUNTER — Telehealth: Payer: Self-pay | Admitting: Medical

## 2019-02-25 ENCOUNTER — Emergency Department (HOSPITAL_COMMUNITY): Payer: Medicare HMO

## 2019-02-25 ENCOUNTER — Emergency Department (HOSPITAL_COMMUNITY)
Admission: EM | Admit: 2019-02-25 | Discharge: 2019-02-26 | Disposition: A | Discharge status: Home / Self Care | Payer: Medicare HMO | Source: Home / Self Care | Attending: Emergency Medicine | Admitting: Emergency Medicine

## 2019-02-25 ENCOUNTER — Encounter (HOSPITAL_COMMUNITY)
Admission: RE | Disposition: A | Discharge status: Home / Self Care | Payer: Self-pay | Source: Home / Self Care | Attending: Internal Medicine

## 2019-02-25 ENCOUNTER — Encounter (HOSPITAL_COMMUNITY): Payer: Self-pay | Admitting: Anesthesiology

## 2019-02-25 DIAGNOSIS — G4733 Obstructive sleep apnea (adult) (pediatric): Secondary | ICD-10-CM | POA: Diagnosis not present

## 2019-02-25 DIAGNOSIS — R0602 Shortness of breath: Secondary | ICD-10-CM | POA: Insufficient documentation

## 2019-02-25 DIAGNOSIS — F17290 Nicotine dependence, other tobacco product, uncomplicated: Secondary | ICD-10-CM | POA: Insufficient documentation

## 2019-02-25 DIAGNOSIS — I480 Paroxysmal atrial fibrillation: Secondary | ICD-10-CM | POA: Diagnosis not present

## 2019-02-25 DIAGNOSIS — I10 Essential (primary) hypertension: Secondary | ICD-10-CM | POA: Diagnosis not present

## 2019-02-25 DIAGNOSIS — R69 Illness, unspecified: Secondary | ICD-10-CM | POA: Diagnosis not present

## 2019-02-25 DIAGNOSIS — Z7901 Long term (current) use of anticoagulants: Secondary | ICD-10-CM | POA: Diagnosis not present

## 2019-02-25 DIAGNOSIS — Z79899 Other long term (current) drug therapy: Secondary | ICD-10-CM | POA: Insufficient documentation

## 2019-02-25 DIAGNOSIS — J810 Acute pulmonary edema: Secondary | ICD-10-CM | POA: Diagnosis not present

## 2019-02-25 DIAGNOSIS — I4819 Other persistent atrial fibrillation: Secondary | ICD-10-CM | POA: Diagnosis not present

## 2019-02-25 DIAGNOSIS — E785 Hyperlipidemia, unspecified: Secondary | ICD-10-CM | POA: Diagnosis not present

## 2019-02-25 DIAGNOSIS — R61 Generalized hyperhidrosis: Secondary | ICD-10-CM

## 2019-02-25 DIAGNOSIS — Z89411 Acquired absence of right great toe: Secondary | ICD-10-CM | POA: Insufficient documentation

## 2019-02-25 DIAGNOSIS — K219 Gastro-esophageal reflux disease without esophagitis: Secondary | ICD-10-CM | POA: Insufficient documentation

## 2019-02-25 DIAGNOSIS — G20 Parkinson's disease: Secondary | ICD-10-CM | POA: Insufficient documentation

## 2019-02-25 HISTORY — PX: CARDIOVERSION: SHX1299

## 2019-02-25 LAB — BASIC METABOLIC PANEL
Anion gap: 10 (ref 5–15)
BUN: 26 mg/dL — ABNORMAL HIGH (ref 8–23)
CO2: 21 mmol/L — ABNORMAL LOW (ref 22–32)
Calcium: 8.5 mg/dL — ABNORMAL LOW (ref 8.9–10.3)
Chloride: 107 mmol/L (ref 98–111)
Creatinine, Ser: 1.06 mg/dL (ref 0.61–1.24)
GFR calc Af Amer: >60 mL/min (ref >60)
GFR calc non Af Amer: >60 mL/min (ref >60)
Glucose, Bld: 93 mg/dL (ref 70–99)
Potassium: 4.1 mmol/L (ref 3.5–5.1)
Sodium: 138 mmol/L (ref 135–145)

## 2019-02-25 LAB — CBC
HCT: 38.7 % — ABNORMAL LOW (ref 39.0–52.0)
Hemoglobin: 12.5 g/dL — ABNORMAL LOW (ref 13.0–17.0)
MCH: 30.5 pg (ref 26.0–34.0)
MCHC: 32.3 g/dL (ref 30.0–36.0)
MCV: 94.4 fL (ref 80.0–100.0)
Platelets: 202 10*3/uL (ref 150–400)
RBC: 4.1 MIL/uL — ABNORMAL LOW (ref 4.22–5.81)
RDW: 13.3 % (ref 11.5–15.5)
WBC: 8.1 10*3/uL (ref 4.0–10.5)
nRBC: 0 % (ref 0.0–0.2)

## 2019-02-25 LAB — TROPONIN I (HIGH SENSITIVITY)
Troponin I (High Sensitivity): 13 ng/L (ref <18)
Troponin I (High Sensitivity): 17 ng/L (ref <18)

## 2019-02-25 LAB — POCT I-STAT, CHEM 8
BUN: 33 mg/dL — ABNORMAL HIGH (ref 8–23)
Calcium, Ion: 0.9 mmol/L — ABNORMAL LOW (ref 1.15–1.40)
Chloride: 106 mmol/L (ref 98–111)
Creatinine, Ser: 1 mg/dL (ref 0.61–1.24)
Glucose, Bld: 94 mg/dL (ref 70–99)
HCT: 37 % — ABNORMAL LOW (ref 39.0–52.0)
Hemoglobin: 12.6 g/dL — ABNORMAL LOW (ref 13.0–17.0)
Potassium: 4.4 mmol/L (ref 3.5–5.1)
Sodium: 138 mmol/L (ref 135–145)
TCO2: 22 mmol/L (ref 22–32)

## 2019-02-25 SURGERY — CARDIOVERSION
Anesthesia: General

## 2019-02-25 MED ORDER — PROPOFOL 10 MG/ML IV BOLUS
INTRAVENOUS | Status: DC | PRN
  Administered 2019-02-25: 20 mg via INTRAVENOUS
  Administered 2019-02-25: 60 mg via INTRAVENOUS

## 2019-02-25 MED ORDER — LIDOCAINE 2% (20 MG/ML) 5 ML SYRINGE
INTRAMUSCULAR | Status: DC | PRN
  Administered 2019-02-25: 40 mg via INTRAVENOUS

## 2019-02-25 MED ORDER — SODIUM CHLORIDE 0.9 % IV SOLN
INTRAVENOUS | Status: DC
  Administered 2019-02-25: 09:00:00 via INTRAVENOUS

## 2019-02-25 NOTE — Anesthesia Preprocedure Evaluation (Addendum)
Anesthesia Evaluation  Patient identified by MRN, date of birth, ID band Patient awake    Reviewed: Allergy & Precautions, NPO status , Patient's Chart, lab work & pertinent test results, reviewed documented beta blocker date and time   Airway Mallampati: II  TM Distance: >3 FB Neck ROM: Full    Dental no notable dental hx. (+) Teeth Intact   Pulmonary sleep apnea and Continuous Positive Airway Pressure Ventilation , Current Smoker and Patient abstained from smoking.,    Pulmonary exam normal breath sounds clear to auscultation       Cardiovascular hypertension, Pt. on medications and Pt. on home beta blockers Normal cardiovascular exam+ dysrhythmias Atrial Fibrillation  Rhythm:Regular Rate:Normal  Flecainide therapy   Neuro/Psych Seizures -, Well Controlled,  Post herpetic neuralgia Parkinson's disease negative psych ROS   GI/Hepatic Neg liver ROS, GERD  Medicated and Controlled,  Endo/Other  Hyperlipidemia  Renal/GU negative Renal ROS   Hx/o elevated PSA ED    Musculoskeletal  (+) Arthritis , Rheumatoid disorders,  Scoliosis Low back pain   Abdominal   Peds  Hematology  (+) anemia , Eliquis therapy- last dose this am   Anesthesia Other Findings   Reproductive/Obstetrics                           Anesthesia Physical Anesthesia Plan  ASA: III  Anesthesia Plan: General   Post-op Pain Management:    Induction: Intravenous  PONV Risk Score and Plan: 1 and Ondansetron and Treatment may vary due to age or medical condition  Airway Management Planned: Mask  Additional Equipment:   Intra-op Plan:   Post-operative Plan:   Informed Consent: I have reviewed the patients History and Physical, chart, labs and discussed the procedure including the risks, benefits and alternatives for the proposed anesthesia with the patient or authorized representative who has indicated his/her  understanding and acceptance.     Dental advisory given  Plan Discussed with: CRNA and Surgeon  Anesthesia Plan Comments:        Anesthesia Quick Evaluation

## 2019-02-25 NOTE — Anesthesia Procedure Notes (Signed)
Date/Time: 02/25/2019 10:30 AM Performed by: Janene Harvey, CRNA Pre-anesthesia Checklist: Patient identified, Emergency Drugs available, Suction available and Patient being monitored Patient Re-evaluated:Patient Re-evaluated prior to induction Oxygen Delivery Method: Ambu bag Dental Injury: Teeth and Oropharynx as per pre-operative assessment

## 2019-02-25 NOTE — H&P (Signed)
   INTERVAL PROCEDURE H&P  History and Physical Interval Note:  02/25/2019 9:44 AM  Samuel Moran has presented today for their planned procedure. The various methods of treatment have been discussed with the patient and family. After consideration of risks, benefits and other options for treatment, the patient has consented to the procedure.  The patients' outpatient history has been reviewed, patient examined, and no change in status from most recent office note within the past 30 days. I have reviewed the patients' chart and labs and will proceed as planned. Questions were answered to the patient's satisfaction.   Samuel Casino, MD, [facility], [place] Director of the Advanced Lipid Disorders &  Cardiovascular Risk Reduction Clinic Diplomate of the American Board of Clinical Lipidology Attending Cardiologist  Direct Dial: [phone]  Fax: [phone]  Website:  [email or web address] 02/25/2019, 9:44 AM

## 2019-02-25 NOTE — CV Procedure (Signed)
   CARDIOVERSION NOTE  Procedure: Electrical Cardioversion Indications:  Atrial Fibrillation  Procedure Details:  Consent: Risks of procedure as well as the alternatives and risks of each were explained to the (patient/caregiver).  Consent for procedure obtained.  Time Out: Verified patient identification, verified procedure, site/side was marked, verified correct patient position, special equipment/implants available, medications/allergies/relevent history reviewed, required imaging and test results available.  Performed  Patient placed on cardiac monitor, pulse oximetry, supplemental oxygen as necessary.  Sedation given: Propofol per anesthesia Pacer pads placed anterior and posterior chest.  Cardioverted 3 time(s).  Cardioverted at 150J and 200J x 2 biphasic.  Impression: Findings: Post procedure EKG shows: NSR Complications: None Patient did tolerate procedure well.  Plan: 1. Ultimately successful DCCV to NSR after 3 stacked shocks.  Time Spent Directly with the Patient:  45 minutes   [name], MD, [facility], [place] Director of the Advanced Lipid Disorders &  Cardiovascular Risk Reduction Clinic Diplomate of the American Board of Clinical Lipidology Attending Cardiologist  Direct Dial: [phone]  Fax: [phone]  Website:  [email or web address] 02/25/2019, 10:38 AM

## 2019-02-25 NOTE — ED Triage Notes (Signed)
Pt had cardioversion this morning for a-fib, went home and ate a high sodium soup, laid down and  Then began to feel more shakey and shob than normal. Has hx of parkinson's. Denies CP, n/v, or dizziness. Hypertensive and recent dx of same, just started htn meds this week. Axox4.

## 2019-02-25 NOTE — Telephone Encounter (Signed)
     Received an outpatient call from patient. Called back and spoke to patient and his friend, who is a Marine scientist. The patient had been here earlier in the day for a cardioversion which was successful. The patient then went home and took a nap. He woke up from the nap and was sweaty and shaky for a couple minutes. He went in to the living room and laid down on the couch. He denied any chest pain, shortness of breath, lightheadedness, or dizziness. They took his BP and reported first at 168/98 and then 173/104, rate of 85. Medications had recently been changed, flecainide was decreased from ? 100 mg BID to 50 mg BID, and he is apparently very sensitive to medications. The patient and friend seemed very worried since this was his third cardioversion and the patient had never felt like this before. Recommended that patient be evaluated in the ED.   Caller verbalized understanding and was grateful for the call back.  Shawnell Dykes Ninfa Meeker, NP 02/25/2019, 6:26 PM

## 2019-02-25 NOTE — Anesthesia Postprocedure Evaluation (Signed)
Anesthesia Post Note  Patient: Samuel Moran  Procedure(s) Performed: CARDIOVERSION (N/A )     Patient location during evaluation: Endoscopy Anesthesia Type: General Level of consciousness: awake and alert and oriented Pain management: pain level controlled Vital Signs Assessment: post-procedure vital signs reviewed and stable Respiratory status: spontaneous breathing, nonlabored ventilation and respiratory function stable Cardiovascular status: blood pressure returned to baseline and stable Postop Assessment: no apparent nausea or vomiting Anesthetic complications: no    Last Vitals:  Vitals:   02/25/19 1050 02/25/19 1100  BP: 132/83 (!) 148/78  Pulse: 64 69  Resp: (!) 30 20  Temp:    SpO2: 94% 98%    Last Pain:  Vitals:   02/25/19 1100  TempSrc:   PainSc: 0-No pain                 Dnyla Antonetti A.

## 2019-02-25 NOTE — ED Notes (Signed)
Sitting outside with wife

## 2019-02-25 NOTE — Discharge Instructions (Signed)

## 2019-02-25 NOTE — Telephone Encounter (Signed)
Overnight Moonlighter Note  Returned page from patient's friend. Concerned about patient who was having some diaphoresis, tachypnea, hypertension (systolics in the [id number]). Decided to bring him to ED. They are in the ED now.   Marcie Mowers, MD Cardiology Fellow, PGY-7

## 2019-02-25 NOTE — Transfer of Care (Signed)
Immediate Anesthesia Transfer of Care Note  Patient: Samuel Moran  Procedure(s) Performed: CARDIOVERSION (N/A )  Patient Location: Endoscopy Unit  Anesthesia Type:General  Level of Consciousness: drowsy  Airway & Oxygen Therapy: Patient Spontanous Breathing and Patient connected to nasal cannula oxygen  Post-op Assessment: Report given to RN and Post -op Vital signs reviewed and stable  Post vital signs: Reviewed  Last Vitals:  Vitals Value Taken Time  BP    Temp    Pulse    Resp    SpO2      Last Pain:  Vitals:   02/25/19 0923  TempSrc: Temporal  PainSc: 0-No pain         Complications: No apparent anesthesia complications

## 2019-02-26 LAB — BRAIN NATRIURETIC PEPTIDE: B Natriuretic Peptide: 217.5 pg/mL — ABNORMAL HIGH (ref 0.0–100.0)

## 2019-02-26 MED ORDER — FUROSEMIDE 20 MG PO TABS: 20.0000 mg | ORAL_TABLET | Freq: Every day | ORAL | 0 refills | Status: AC

## 2019-02-26 NOTE — ED Provider Notes (Addendum)
TIME SEEN: 2:28 AM  CHIEF COMPLAINT: Diaphoresis, shakiness  HPI: Patient is a 73 year old male with history of atrial fibrillation on flecainide and Eliquis, Parkinson's, and rheumatoid arthritis who presents to the emergency department with an episode of diaphoresis and feeling very shaky that started around 1 PM after eating lunch.  He was in the hospital yesterday for a cardioversion with Dr. Debara Pickett which was successful.  States he felt fine on discharge but then after eating lunch he felt very shaky and became diaphoretic.  Shakiness lasted for about 2 to 3 hours.  He had no chest pain or chest discomfort, no shortness of breath, no nausea or vomiting, diarrhea.  He states he is feeling fine currently.  No longer feeling shaky or diaphoretic.  No recent fevers, cough.  No lower extremity swelling or pain.  No numbness or focal weakness.  He has had 2 previous cardioversions and states that he has never had symptoms similar to this before.  Echo March 2019:  Study Conclusions  - Left ventricle: The cavity size was normal. Wall thickness was   increased in a pattern of moderate LVH. Indeterminant diastolic   function (atrial fibrillation). Systolic function was normal. The   estimated ejection fraction was in the range of 55% to 60%. Wall   motion was normal; there were no regional wall motion   abnormalities. - Aortic valve: There was no stenosis. - Mitral valve: There was trivial regurgitation. - Left atrium: The atrium was moderately dilated. - Right ventricle: The cavity size was normal. Systolic function   was normal. - Right atrium: The atrium was mildly dilated. - Tricuspid valve: Peak RV-RA gradient (S): 31 mm Hg. - Pulmonary arteries: PA peak pressure: 39 mm Hg (S). - Systemic veins: IVC measured 2.2 with > 50% respirophasic   variation, suggesting RA pressure 8 mmHg.  ROS: See HPI Constitutional: no fever  Eyes: no drainage  ENT: no runny nose   Cardiovascular:  no chest  pain  Resp: no SOB  GI: no vomiting GU: no dysuria Integumentary: no rash  Allergy: no hives  Musculoskeletal: no leg swelling  Neurological: no slurred speech ROS otherwise negative  PAST MEDICAL HISTORY/PAST SURGICAL HISTORY:  Past Medical History:  Diagnosis Date  . Atrial fibrillation ([place])    onset 07/17/17  . Dysrhythmia   . ED (erectile dysfunction)   . GERD 07/01/2007  . Parkinson's disease ([place])   . 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 PROSTATE SPECIFIC ANTIGEN, ELEVATED 06/29/2007  . Rheumatoid arthritis ([place])   . SEIZURE DISORDER 07/01/2007   one epsiode only  . Spinal stenosis of lumbar region 09/22/2014    MEDICATIONS:  Prior to Admission medications   Medication Sig Start Date End Date Taking? Authorizing Provider  B Complex-C (SUPER B COMPLEX PO) Take 1 tablet by mouth every evening.    [provider]  carbidopa-levodopa (SINEMET IR) 25-100 MG tablet Take 1 tablet by mouth 4 (four) times daily. Take at 7am/11am/3pm/7pm 01/25/19   Tat, Eustace Quail, DO  carvedilol (COREG) 12.5 MG tablet Take 1 tablet (12.5 mg total) by mouth 2 (two) times daily. 02/15/19   Camnitz, Will Hassell Done, MD  ELIQUIS 5 MG TABS tablet TAKE 1 TABLET BY MOUTH TWICE A DAY 12/24/18   Park Liter, MD  flecainide (TAMBOCOR) 100 MG tablet Take 1 tablet (100 mg total) by mouth 2 (two) times daily. 02/15/19   Camnitz, Ocie Doyne, MD  ibuprofen (ADVIL,MOTRIN) 200 MG tablet Take 800 mg by mouth 2 (two)  times daily as needed (for pain/headaches.).     [provider]  leflunomide (ARAVA) 20 MG tablet Take 20 mg by mouth daily. 07/06/18   [provider]  Multiple Vitamin (MULTIVITAMIN WITH MINERALS) TABS tablet Take 1 tablet by mouth daily. Centrum Silver    [provider]  NEUPRO 4 MG/24HR APPLY 1 [place] DAY Patient taking differently: Place 1 patch onto the skin daily.  12/20/18   Tat, Eustace Quail, DO  Omega-3 Fatty Acids (FISH OIL) 1200 MG CPDR Take  1,200 mg by mouth daily.    [provider]  omeprazole (PRILOSEC) 20 MG capsule Take 20 mg by mouth daily.     [provider]    ALLERGIES:  No Known Allergies  SOCIAL HISTORY:  Social History   Tobacco Use  . Smoking status: Light Tobacco Smoker    Types: Cigars  . Smokeless tobacco: Never Used  . Tobacco comment: 1 cigar, takes 3 weeks  Substance Use Topics  . Alcohol use: Yes    Alcohol/week: 4.0 standard drinks    Types: 4 Standard drinks or equivalent per week    Comment: one drink a day (liquor)    FAMILY HISTORY: Family History  Problem Relation Age of Onset  . Arthritis Mother   . Alcoholism Mother   . Arthritis Father        died after fall/trauma and had TBI  . Colon cancer Neg Hx   . Esophageal cancer Neg Hx   . Rectal cancer Neg Hx   . Stomach cancer Neg Hx     EXAM: BP 135/84 (BP Location: Right Arm)   Pulse 73   Temp 98.5 F (36.9 C) (Oral)   Resp 19   SpO2 98%  CONSTITUTIONAL: Alert and oriented and responds appropriately to questions. Well-appearing; well-nourished, extremely pleasant HEAD: Normocephalic EYES: Conjunctivae clear, pupils appear equal, EOMI ENT: normal nose; moist mucous membranes NECK: Supple, no meningismus, no nuchal rigidity, no LAD  CARD: RRR; S1 and S2 appreciated; no murmurs, no clicks, no rubs, no gallops RESP: Normal chest excursion without splinting or tachypnea; breath sounds clear and equal bilaterally; no wheezes, no rhonchi, no rales, no hypoxia or respiratory distress, speaking full sentences ABD/GI: Normal bowel sounds; non-distended; soft, non-tender, no rebound, no guarding, no peritoneal signs, no hepatosplenomegaly BACK:  The back appears normal and is non-tender to palpation, there is no CVA tenderness EXT: Normal ROM in all joints; non-tender to palpation; no edema; normal capillary refill; no cyanosis, no calf tenderness or swelling    SKIN: Normal color for age and race; warm; no  rash NEURO: Moves all extremities equally, mild tremors consistent with Parkinson's, no facial asymmetry, normal speech PSYCH: The patient's mood and manner are appropriate. Grooming and personal hygiene are appropriate.  MEDICAL DECISION MAKING: Patient here with an episode of diaphoresis and shakiness.  Called cardiology on-call who recommended he come to the emergency department.  He had no shortness of breath despite nursing notes.  No chest pain or chest discomfort.  He has had 2 troponins that have been negative here today.  His chest x-ray does show some diffuse interstitial opacities consistent with edema and his BNP is 217.  He states he is not short of breath and he has not tachypneic or hypoxic here but his significant other at the bedside states that she has noticed some intermittent tachypnea.  We have discussed at length given patient Lasix here versus going home with a prescription  to start if symptoms worsen.  Patient would prefer to start oral medications outpatient if he begins to feel symptomatic.  Patient significant other was previously a Marine scientist and feels comfortable watching him closely.  Recommended follow-up with cardiology as an outpatient.  Discussed at length return precautions.  EKG here shows sinus rhythm without ischemic change or other arrhythmia.  Patient and family at bedside are comfortable with plan for discharge home.  Doubt ACS, PE, dissection.  No infectious symptoms.  His EKG shows no arrhythmia.  He continues to be hemodynamically stable here in the emergency department.  At this time, I do not feel there is any life-threatening condition present. I have reviewed and discussed all results (EKG, imaging, lab, urine as appropriate) and exam findings with patient/family. I have reviewed nursing notes and appropriate previous records.  I feel the patient is safe to be discharged home without further emergent workup and can continue workup as an outpatient as needed.  Discussed usual and customary return precautions. Patient/family verbalize understanding and are comfortable with this plan.  Outpatient follow-up has been provided as needed. All questions have been answered.     EKG Interpretation  Date/Time:  Friday February 25 2019 19:30:50 EDT Ventricular Rate:  87 PR Interval:  242 QRS Duration: 78 QT Interval:  368 QTC Calculation: 442 R Axis:   73 Text Interpretation:  Sinus rhythm with 1st degree A-V block with Premature supraventricular complexes and with occasional Premature ventricular complexes Otherwise normal ECG No significant change since last tracing Confirmed by Pryor Curia [phone]) on 02/26/2019 2:30:23 AM        EKG Interpretation  Date/Time:  Saturday February 26 2019 02:36:46 EDT Ventricular Rate:  77 PR Interval:  242 QRS Duration: 96 QT Interval:  390 QTC Calculation: 442 R Axis:   71 Text Interpretation:  Sinus rhythm Prolonged PR interval No significant change since last tracing Confirmed by Pryor Curia [phone]) on 02/26/2019 4:39:09 AM        Samuel Moran was evaluated in Emergency Department on 02/26/2019 for the symptoms described in the history of present illness. He was evaluated in the context of the global COVID-19 pandemic, which necessitated consideration that the patient might be at risk for infection with the SARS-CoV-2 virus that causes COVID-19. Institutional protocols and algorithms that pertain to the evaluation of patients at risk for COVID-19 are in a state of rapid change based on information released by regulatory bodies including the CDC and federal and state organizations. These policies and algorithms were followed during the patient's care in the ED.    Angalena Cousineau, Delice Bison, DO 02/26/19 Baldwin, Delice Bison, DO 02/26/19 [phone]

## 2019-02-26 NOTE — ED Notes (Signed)
Discharge instructions discussed with pt. Pt verbalized understanding. GF at bedside.

## 2019-02-27 ENCOUNTER — Encounter (HOSPITAL_COMMUNITY): Payer: Self-pay | Admitting: Internal Medicine

## 2019-02-28 DIAGNOSIS — Z6830 Body mass index (BMI) 30.0-30.9, adult: Secondary | ICD-10-CM | POA: Diagnosis not present

## 2019-02-28 DIAGNOSIS — M5416 Radiculopathy, lumbar region: Secondary | ICD-10-CM | POA: Diagnosis not present

## 2019-02-28 DIAGNOSIS — M545 Low back pain: Secondary | ICD-10-CM | POA: Diagnosis not present

## 2019-02-28 DIAGNOSIS — I10 Essential (primary) hypertension: Secondary | ICD-10-CM | POA: Diagnosis not present

## 2019-02-28 NOTE — Telephone Encounter (Signed)
Needs AF clinic or EP APP follow up

## 2019-02-28 NOTE — Telephone Encounter (Signed)
lmtcb to arrange OV

## 2019-03-01 ENCOUNTER — Encounter: Payer: Self-pay | Admitting: Family

## 2019-03-01 ENCOUNTER — Ambulatory Visit (INDEPENDENT_AMBULATORY_CARE_PROVIDER_SITE_OTHER): Payer: Medicare HMO | Admitting: Family

## 2019-03-01 ENCOUNTER — Other Ambulatory Visit: Payer: Self-pay

## 2019-03-01 VITALS — BP 160/90 | HR 70 | Temp 97.3°F | Ht 71.0 in | Wt 218.0 lb

## 2019-03-01 DIAGNOSIS — R0609 Other forms of dyspnea: Secondary | ICD-10-CM

## 2019-03-01 DIAGNOSIS — Z79899 Other long term (current) drug therapy: Secondary | ICD-10-CM

## 2019-03-01 DIAGNOSIS — I4819 Other persistent atrial fibrillation: Secondary | ICD-10-CM

## 2019-03-01 DIAGNOSIS — R0600 Dyspnea, unspecified: Secondary | ICD-10-CM

## 2019-03-01 DIAGNOSIS — I10 Essential (primary) hypertension: Secondary | ICD-10-CM | POA: Diagnosis not present

## 2019-03-01 DIAGNOSIS — Z7901 Long term (current) use of anticoagulants: Secondary | ICD-10-CM

## 2019-03-01 MED ORDER — FLECAINIDE ACETATE 150 MG PO TABS: 150.0000 mg | ORAL_TABLET | Freq: Two times a day (BID) | ORAL | 0 refills | Status: DC

## 2019-03-01 NOTE — Patient Instructions (Addendum)
Medication Instructions:   Your physician has recommended you make the following change in your medication:   Flecainide 150mg  twice daily.   *If you need a refill on your cardiac medications before your next appointment, please call your pharmacy*  Lab Work: Your physician recommends that you return for lab work today: BMET, Flecainide level, ProBNP  If you have labs (blood work) drawn today and your tests are completely normal, you will receive your results only by: Marland Kitchen MyChart Message (if you have MyChart) OR . A paper copy in the mail If you have any lab test that is abnormal or we need to change your treatment, we will call you to review the results.  Testing/Procedures: EKG today shows you are back in atrial fibrillation at a controlled rate.   Follow-Up: At [facility], you and your health needs are our priority.  As part of our continuing mission to provide you with exceptional heart care, we have created designated Provider Care Teams.  These Care Teams include your primary Cardiologist (physician) and Advanced Practice Providers (APPs -  Physician Assistants and Nurse Practitioners) who all work together to provide you with the care you need, when you need it.  Appointment to be scheduled with Dr. Curt Bears to discuss atrial fibrillation ablation.  Other Instructions  Recommend low salt heart healthy diet. Much of your shortness of breath with activity is likely due to the recurrent atrial fibrillation. We will increase your Flecainide level today to see if we can get you out of the atrial fibrillation with medication. We will schedule you with Dr. Curt Bears to discuss atrial fibrillation ablation. This is a procedure to eliminate the extra electrical signals in your heart causing atrial fibrillation.

## 2019-03-01 NOTE — Progress Notes (Signed)
Office Visit    Patient Name: Samuel Moran Date of Encounter: 03/01/2019  Primary Care Provider:  Park Liter, MD Primary Cardiologist:  Jenne Campus, MD Electrophysiologist:  Will Meredith Leeds, MD   Chief Complaint    Samuel Moran is a 73 y.o. male with a hx of atrial fibrillation on flecainide and Eliquis, Parkinson's, rheumatoid arthritis presents today for follow-up after ED visit.  Past Medical History    Past Medical History:  Diagnosis Date  . Atrial fibrillation ([place])    onset 07/17/17  . Dysrhythmia   . ED (erectile dysfunction)   . GERD 07/01/2007  . Parkinson's disease ([place])   . 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 PROSTATE SPECIFIC ANTIGEN, ELEVATED 06/29/2007  . Rheumatoid arthritis ([place])   . SEIZURE DISORDER 07/01/2007   one epsiode only  . Spinal stenosis of lumbar region 09/22/2014   Past Surgical History:  Procedure Laterality Date  . AMPUTATION     right great toe - tramatic age 73  . CARDIOVERSION N/A 03/15/2018   Procedure: CARDIOVERSION;  Surgeon: [name], MD;  Location: [facility] ENDOSCOPY;  Service: Cardiovascular;  Laterality: N/A;  . CARDIOVERSION N/A 12/13/2018   Procedure: CARDIOVERSION;  Surgeon: Sanda Klein, MD;  Location: [facility] ENDOSCOPY;  Service: Cardiovascular;  Laterality: N/A;  . CARDIOVERSION N/A 02/25/2019   Procedure: CARDIOVERSION;  Surgeon: [name], MD;  Location: [facility] ENDOSCOPY;  Service: Cardiovascular;  Laterality: N/A;  . COLONOSCOPY    . DEBRIDEMENT TOE Right 07/2016  . HAND SURGERY  03/2017   removal cysts  . IR FLUORO GUIDE CV LINE RIGHT  10/16/2016  . IR US GUIDE VASC ACCESS RIGHT  10/16/2016  . OSTECTOMY Right 08/05/2017   Procedure: OSTECTOMY COMPLETE METARSAL HEAD FIRST RIGHT;  Surgeon: Trula Slade, DPM;  Location: [facility];  Service: Podiatry;  Laterality: Right;    Allergies  No Known Allergies  History of Present Illness    Samuel Moran is a 73 y.o. male with a hx of  atrial fibrillation on flecainide and Eliquis, Parkinson's, rheumatoid arthritis last seen for cardioversion 02/25/2019 by Dr. Debara Pickett. Of note, has had DCCV x3.  ED visit 02/25/2019 with reports of shakiness after cardioversion. He was started on Lasix PO x5 days for an elevated BNP. He was in SR at the time of ED visit.   Reports feeling DOE and fatigued since last seen. He is surprised to learn he is back in atrial fibrillation. Reports home BP 130s/70s at home. His significant other is a Marine scientist and helps him monitor symptoms carefully. Does report some episodes of sweating since ED visit, but not near to extreme as symptoms at that time.   Continues to smoke a cigar per week and drinks 2 oz bourbon most nights. Discussed that these were likely to trigger arrthymias. Also discussed risk of bleeding with etoh and Eliquis. He denies bleeding complications.   We discussed progression of atrial fib that does not respond to DCCV or medications. When discussion atrial fib ablation he is agreeable to consider. He wants to be able to get back to walking his dog regularly and having energy.  EKGs/Labs/Other Studies Reviewed:   The following studies were reviewed today:  Lexi scan 04/2018  Nuclear stress EF: 59%.  No T wave inversion was noted during stress.  There was no ST segment deviation noted during stress.  The study is normal.  This is a low risk study.   Normal perfusion. LVEF 59% with normal  regional wall motion. No prior study for comparison. This is a low risk study.   Holter monitor 10/2017 Baseline rhythm: Atrial fibrillation   Minimum heart rate: 55 BPM.  Average heart rate: 87 BPM.  Maximal heart rate 115 BPM.   Atrial arrhythmia: Atrial fibrillation alter the recording   Ventricular arrhythmia: 622 premature ventricular beats with 2 ventricular couplets   Conduction abnormality: None   Symptoms: None     Conclusion:  Atrial fibrillation with controlled ventricular  rate,  Infrequent PVCs  Echo 07/2017 Left ventricle: The cavity size was normal. Wall thickness was   increased in a pattern of moderate LVH. Indeterminant diastolic   function (atrial fibrillation). Systolic function was normal. The   estimated ejection fraction was in the range of 55% to 60%. Wall   motion was normal; there were no regional wall motion   abnormalities. - Aortic valve: There was no stenosis. - Mitral valve: There was trivial regurgitation. - Left atrium: The atrium was moderately dilated. - Right ventricle: The cavity size was normal. Systolic function   was normal. - Right atrium: The atrium was mildly dilated. - Tricuspid valve: Peak RV-RA gradient (S): 31 mm Hg. - Pulmonary arteries: PA peak pressure: 39 mm Hg (S). - Systemic veins: IVC measured 2.2 with > 50% respirophasic   variation, suggesting RA pressure 8 mmHg.   Impressions:   - The patient was in atrial fibrillation. Normal LV size with   moderate LV hypertrophy. EF 55-60%. Normal RV size and systolic   function. No significant valvular abnormalities. Mild pulmonary   hypertension.  EKG:  EKG is  ordered today.  The ekg ordered today demonstrates atrial fibrillation rate 67 bpm with QTc 437, no acute ST/T wave changes.  Recent Labs: 02/25/2019: B Natriuretic Peptide 217.5; BUN 26; Creatinine, Ser 1.06; Hemoglobin 12.5; Platelets 202; Potassium 4.1; Sodium 138  Recent Lipid Panel    Component Value Date/Time   CHOL 168 02/26/2017 0922   TRIG 55.0 02/26/2017 0922   HDL 52.90 02/26/2017 0922   CHOLHDL 3 02/26/2017 0922   VLDL 11.0 02/26/2017 0922   LDLCALC 104 (H) 02/26/2017 0922    Home Medications   Current Meds  Medication Sig  . ELIQUIS 5 MG TABS tablet TAKE 1 TABLET BY MOUTH TWICE A DAY  . [DISCONTINUED] flecainide (TAMBOCOR) 100 MG tablet Take 1 tablet (100 mg total) by mouth 2 (two) times daily.      Review of Systems    Review of Systems  Constitution: Negative for chills, fever  and malaise/fatigue.  Cardiovascular: Positive for dyspnea on exertion and irregular heartbeat. Negative for chest pain, leg swelling, near-syncope, orthopnea and palpitations.  Respiratory: Negative for cough, shortness of breath and wheezing.   Gastrointestinal: Negative for nausea and vomiting.  Neurological: Negative for dizziness, light-headedness and weakness.   All other systems reviewed and are otherwise negative except as noted above.  Physical Exam    VS:  BP (!) 160/90 (BP Location: Left Arm, Patient Position: Sitting, Cuff Size: Normal)   Pulse 70   Temp (!) 97.3 F (36.3 C) (Temporal)   Ht 5\' 11"  (1.803 m)   Wt 218 lb (98.9 kg)   SpO2 96%   BMI 30.40 kg/m  , BMI Body mass index is 30.4 kg/m. GEN: Well nourished, well developed, in no acute distress. HEENT: normal. Neck: Supple, no JVD, carotid bruits, or masses. Cardiac: irregularly irregular, no murmurs, rubs, or gallops. No clubbing, cyanosis, edema.  Radials/DP/PT 2+ and equal bilaterally.  Respiratory:  Respirations regular and unlabored, clear to auscultation bilaterally. GI: Soft, nontender, nondistended, BS + x 4. MS: No deformity or atrophy. Skin: Warm and dry, no rash. Neuro:  Strength and sensation are intact. Psych: Normal affect.  Accessory Clinical Findings    ECG personally reviewed by me today - rate controlled atrial fibrillation 67 bpm with return of atrial fibrillation since last tracing.  Assessment & Plan    1. Persistent atrial fibrillation - DCCV 02/25/19 - recurrent atrial fibrillation post cardioversion. EKG today rate controlled atrial fib rate 67 bpm. This is his third cardioversion. Symptomatic with fatigue and DOE. Discussed consideration of atrial fibrillation ablation and he is willing to continue discussion with Dr. Curt Bears. In interim, flecainide level today and increase to 150mg  BID at recommendation of Dr. Geraldo Pitter DOD. We will get him set up to see Dr. Curt Bears as soon as possible.    2. High risk medication use - Flecainide with appropriate Coreg for rate control. Flecainide level today. Normal QTc today 437.  3. Chronic anticoagulation - Secondary to atrial fib. CHADS2VASC 2. No bleeding complications. Continue Eliquis 5mg  BID.  4. HTN - BP elevated today in office but reports well controlled 130s/70s at home. Continue present antihypertensive regimen. Continue to monitor at home.   5. OSA - CPAP compliance encouraged.   6. Elevated BNP - Noted during ED visit 02/25/19 BNP 217.5. Likely fluid retention due to atrial fibrillation. He was started on Lasix 20mg  x5 days. BMET, BNP today for monitoring. Euvolemic on exam. Does endorse DOE which is likely due to recurrent atrial fibrillation.   I have called his significant other Sheryl who is a nurse at his request to give her an update.   Disposition: Follow up with Dr. Curt Bears to discuss ablation.   [name], NP 03/01/2019, 1:14 PM

## 2019-03-02 ENCOUNTER — Telehealth: Payer: Self-pay | Admitting: Family

## 2019-03-02 MED ORDER — FLECAINIDE ACETATE 100 MG PO TABS: 100.0000 mg | ORAL_TABLET | Freq: Two times a day (BID) | ORAL | 1 refills | Status: DC

## 2019-03-02 NOTE — Telephone Encounter (Signed)
Seen for office visit 03/01/19. Called 03/02/19 - at his office visit we were of the understanding that he was taking Flecainide 100mg  BID so we increased to 150mg  BID. He calls today to tell us he was actually take 50mg  BID - as such, we will increase his dose to 100mg  BID.   Tells me he has plenty of Flecainide until his upcoming appointment with Dr. Curt Bears next week.   [name], NP

## 2019-03-02 NOTE — Addendum Note (Signed)
Addended by: [name] on: 03/02/2019 08:53 AM   Modules accepted: Orders

## 2019-03-02 NOTE — Progress Notes (Signed)
Samuel Moran called our office to let us know he had been taking 50mg  Flecainide rather than the 100mg  on our records. As such, will increase his dose from 50mg  BID to 100mg  BID. He verbalizes understanding of this plan. Tells me he has plenty of Flecainide to get him to his appt with Samuel Moran next week.   [name], NP

## 2019-03-03 ENCOUNTER — Ambulatory Visit: Payer: Medicare HMO | Admitting: Physical Therapy

## 2019-03-03 ENCOUNTER — Other Ambulatory Visit: Payer: Self-pay

## 2019-03-03 ENCOUNTER — Encounter: Payer: Self-pay | Admitting: Physical Therapy

## 2019-03-03 DIAGNOSIS — M6281 Muscle weakness (generalized): Secondary | ICD-10-CM | POA: Diagnosis not present

## 2019-03-03 DIAGNOSIS — R2689 Other abnormalities of gait and mobility: Secondary | ICD-10-CM | POA: Diagnosis not present

## 2019-03-03 DIAGNOSIS — R2681 Unsteadiness on feet: Secondary | ICD-10-CM | POA: Diagnosis not present

## 2019-03-03 DIAGNOSIS — R293 Abnormal posture: Secondary | ICD-10-CM | POA: Diagnosis not present

## 2019-03-03 LAB — BASIC METABOLIC PANEL
BUN/Creatinine Ratio: 25 — ABNORMAL HIGH (ref 10–24)
BUN: 28 mg/dL — ABNORMAL HIGH (ref 8–27)
CO2: 22 mmol/L (ref 20–29)
Calcium: 8.5 mg/dL — ABNORMAL LOW (ref 8.6–10.2)
Chloride: 104 mmol/L (ref 96–106)
Creatinine, Ser: 1.13 mg/dL (ref 0.76–1.27)
GFR calc Af Amer: 75 mL/min/{1.73_m2} (ref >59)
GFR calc non Af Amer: 65 mL/min/{1.73_m2} (ref >59)
Glucose: 97 mg/dL (ref 65–99)
Potassium: 4.2 mmol/L (ref 3.5–5.2)
Sodium: 138 mmol/L (ref 134–144)

## 2019-03-03 LAB — FLECAINIDE LEVEL: Flecainide: 0.3 ug/mL (ref 0.20–1.00)

## 2019-03-03 LAB — BRAIN NATRIURETIC PEPTIDE: BNP: 355.1 pg/mL — ABNORMAL HIGH (ref 0.0–100.0)

## 2019-03-03 NOTE — Patient Instructions (Signed)
      Perform 1x/day, work up to 10-20 reps

## 2019-03-03 NOTE — Therapy (Signed)
[facility] [address] [place], [place], [address] Phone: [phone]   Fax:  [phone]  Physical Therapy Treatment  Patient Details  Name: Samuel Moran MRN: [id number] Date of Birth: 04-09-1946 Referring Provider (PT): Tat, Wells Guiles   Encounter Date: 03/03/2019  PT End of Session - 03/03/19 0922    Visit Number  2    Number of Visits  9    Date for PT Re-Evaluation  04/19/19    Authorization Type  [employer] Medicare-will need 10th visit progress note    PT Start Time  0803    PT Stop Time  0844    PT Time Calculation (min)  41 min    Activity Tolerance  Patient tolerated treatment well   Pt rates hip pain as 0/10 at end of session   Behavior During Therapy  [facility] for tasks assessed/performed       Past Medical History:  Diagnosis Date  . Atrial fibrillation ([place])    onset 07/17/17  . Dysrhythmia   . ED (erectile dysfunction)   . GERD 07/01/2007  . Parkinson's disease ([place])   . 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 PROSTATE SPECIFIC ANTIGEN, ELEVATED 06/29/2007  . Rheumatoid arthritis ([place])   . SEIZURE DISORDER 07/01/2007   one epsiode only  . Spinal stenosis of lumbar region 09/22/2014    Past Surgical History:  Procedure Laterality Date  . AMPUTATION     right great toe - tramatic age 89  . CARDIOVERSION N/A 03/15/2018   Procedure: CARDIOVERSION;  Surgeon: [name], MD;  Location: [facility] ENDOSCOPY;  Service: Cardiovascular;  Laterality: N/A;  . CARDIOVERSION N/A 12/13/2018   Procedure: CARDIOVERSION;  Surgeon: Sanda Klein, MD;  Location: [facility] ENDOSCOPY;  Service: Cardiovascular;  Laterality: N/A;  . CARDIOVERSION N/A 02/25/2019   Procedure: CARDIOVERSION;  Surgeon: [name], MD;  Location: [facility] ENDOSCOPY;  Service: Cardiovascular;  Laterality: N/A;  . COLONOSCOPY    . DEBRIDEMENT TOE Right 07/2016  . HAND SURGERY  03/2017   removal cysts  . IR FLUORO GUIDE CV LINE RIGHT  10/16/2016  . IR  US GUIDE VASC ACCESS RIGHT  10/16/2016  . OSTECTOMY Right 08/05/2017   Procedure: OSTECTOMY COMPLETE METARSAL HEAD FIRST RIGHT;  Surgeon: Trula Slade, DPM;  Location: [facility];  Service: Podiatry;  Laterality: Right;    There were no vitals filed for this visit.  Subjective Assessment - 03/03/19 0806    Subjective  Hip is bothering me this morning.  Sometimes it's better than others.  Have difficulty getting out of the car sometimes due to RLE pain.  Had cardioversion (planned), then ED visit due to diaphoresis.  No activity limitations, restrictions I'm aware of.    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  Yes    Pain Score  6     Pain Location  Hip    Pain Orientation  Right    Pain Descriptors / Indicators  Burning    Pain Radiating Towards  Pt goes all the way down to foot at times.    Pain Onset  More than a month ago    Pain Frequency  Intermittent    Aggravating Factors   driving, sitting    Pain Relieving Factors  change  of poistions, injections                       [facility] Adult PT Treatment/Exercise - 03/03/19 0001      Therapeutic Activites    Therapeutic Activities  Other Therapeutic Activities    Other Therapeutic Activities  In response to pt's concerns, questions about difficulty getting out of car, practiced options to assist with this:  seated lateral weigthshifting, then seated leg lifts to simulate stepping out of car, then once standing, lateral weightshifting.  Also performed seated RLE hamstring stretch 2 x 30 seconds.      Exercises   Exercises  Lumbar      Lumbar Exercises: Stretches   Active Hamstring Stretch  Right;1 rep;30 seconds    Active Hamstring Stretch Limitations  using belt in supine-review from HEP    Lower Trunk Rotation  3 reps;20  seconds   initiated with rocking side to side   Pelvic Tilt  5 reps    Pelvic Tilt Limitations  with cues for technique      Lumbar Exercises: Supine   Glut Set  10 reps;3 seconds    Glut Set Limitations  Press down into the mat, each leg    Other Supine Lumbar Exercises  Supine leg lengthener, x 5 reps each leg.      Lumbar Exercises: Quadruped   [place]  5 reps   quadruped on mat       PWR [facility]) - 03/03/19 [id number]    PWR! exercises  Moves in supine;Moves in [place]! Up  x 5 reps through shoulders, x 5 reps through hips    PWR! Rock  x 10 reps forward and back    PWR! Twist  x 5 reps each side    Comments  PWR! Moves in quadruped on mat, visual and verbal cues for technique    PWR! Up  x 5 reps through shoulders, then 5 reps through hips    PWR! Rock  x 5 reps each side    PWR! Twist  x 5 reps each side    PWR! Step  x 5 reps single step out and in, cues for foot flat    Comments  PWR! Moves in supine, verbal and visual cues for technique          PT Education - 03/03/19 0922    Education Details  Initiated HEP-PWR! Moves for stretch, trunk flexibility    Person(s) Educated  Patient    Methods  Explanation;Demonstration;Handout;Verbal cues    Comprehension  Verbalized understanding;Returned demonstration          PT Long Term Goals - 02/18/19 0925      PT LONG TERM GOAL #1   Title  Pt will be independent with HEP to address Parkinson's deficits, pain, balance.    Time  4    Period  Weeks    Status  New    Target Date  03/18/19      PT LONG TERM GOAL #2   Title  Pt will perform 5x sit<>stand in less than or equal to 13 sec or improved transfer efficiency and strength.    Time  4    Period  Weeks    Status  New    Target Date  03/18/19      PT LONG TERM GOAL #3   Title  Pt will improve DGI score to at least 20/24 for decreased fall risk.  Time  4    Period  Weeks    Status  New    Target Date  03/18/19      PT LONG TERM GOAL #4    Title  Pt will verbalize understanding of fall prevention in the home environment.    Time  4    Period  Weeks    Status  New    Target Date  03/18/19            Plan - 03/03/19 [id number]    Clinical Impression Statement  Pt returns today for PT visit after several weeks off, due to planned cardioversion (then ED visit due to diaphoresis).  Pt with no c/o with activity today and tolerates exercises well; no c/o pain at end of session.  Focused session on basic lumbar exercises, with progression to supine and quadruped PWR! Moves to address trunk/hip flexibility and strengthening.  Pt will continue to benefit from skilled PT to address stiffness, flexibility, improved initiation of stepping motions (esepcially to get in and out of car).    Personal Factors and Comorbidities  Comorbidity 3+    Comorbidities  See PMH-PD, A-fib, great toe amputation RLE    Examination-Activity Limitations  Sit;Stairs;Transfers;Locomotion Level    Examination-Participation Restrictions  Community Activity   Return to gym activities   Stability/Clinical Decision Making  Evolving/Moderate complexity    Rehab Potential  Good    PT Frequency  2x / week    PT Duration  4 weeks   plus eval; total POC = 5 weeks   PT Treatment/Interventions  ADLs/Self Care Home Management;Stair training;Functional mobility training;Therapeutic activities;Therapeutic exercise;Balance training;Neuromuscular re-education;Gait training;DME Instruction;Patient/family education;Manual techniques    PT Next Visit Plan  Review PWR! Moves given as HEP; work on getting in and out of car (maybe add seated PWR! Moves)    Consulted and Agree with Plan of Care  Patient       Patient will benefit from skilled therapeutic intervention in order to improve the following deficits and impairments:  Abnormal gait, Decreased balance, Decreased mobility, Decreased strength, Impaired flexibility, Postural dysfunction, Difficulty walking, Pain  Visit  Diagnosis: Muscle weakness (generalized)  Abnormal posture     Problem List Patient Active Problem List   Diagnosis Date Noted  . Obstructive sleep apnea 05/31/2018  . Full thickness rotator cuff tear 03/16/2018  . Scoliosis deformity of spine 03/02/2018  . Acute low back pain 12/18/2017  . Degeneration of lumbosacral intervertebral disc 10/05/2017  . HTN (hypertension) 09/21/2017  . Bilateral leg edema 09/21/2017  . Osteomyelitis of toe of right foot ([place]) 08/05/2017  . Rheumatoid arthritis, adult ([place]) 08/05/2017  . Parkinson disease ([place]) 08/05/2017  . HLD (hyperlipidemia) 08/05/2017  . Paroxysmal atrial fibrillation ([place]) 08/05/2017  . Normocytic anemia 08/05/2017  . Persistent atrial fibrillation ([place]) 07/22/2017  . ED (erectile dysfunction) 04/20/2012  . GERD 07/01/2007  . SEIZURE DISORDER 07/01/2007  . Dyslipidemia 06/29/2007    MARRIOTT,AMY W. 03/03/2019, 9:26 AM Frazier Butt., PT [facility] [address] [place], [place], [address] Phone: [phone]   Fax:  [phone]  Name: Samuel Moran MRN: [id number] Date of Birth: 09-Aug-1945

## 2019-03-04 ENCOUNTER — Encounter: Payer: Self-pay | Admitting: Physical Therapy

## 2019-03-04 ENCOUNTER — Other Ambulatory Visit: Payer: Self-pay

## 2019-03-04 ENCOUNTER — Ambulatory Visit: Payer: Medicare HMO | Admitting: Physical Therapy

## 2019-03-04 VITALS — BP 111/68 | HR 80

## 2019-03-04 DIAGNOSIS — R2689 Other abnormalities of gait and mobility: Secondary | ICD-10-CM | POA: Diagnosis not present

## 2019-03-04 DIAGNOSIS — R293 Abnormal posture: Secondary | ICD-10-CM

## 2019-03-04 DIAGNOSIS — R2681 Unsteadiness on feet: Secondary | ICD-10-CM | POA: Diagnosis not present

## 2019-03-04 DIAGNOSIS — M6281 Muscle weakness (generalized): Secondary | ICD-10-CM

## 2019-03-04 NOTE — Therapy (Signed)
[facility] [address] [place], [place], [address] Phone: [phone]   Fax:  [phone]  Physical Therapy Treatment  Patient Details  Name: Samuel Moran MRN: [id number] Date of Birth: 1945/10/24 Referring Provider (PT): Tat, Wells Guiles   Encounter Date: 03/04/2019  PT End of Session - 03/04/19 1214    Visit Number  3    Number of Visits  9    Date for PT Re-Evaluation  04/19/19    Authorization Type  [employer] Medicare-will need 10th visit progress note    PT Start Time  0802    PT Stop Time  0842    PT Time Calculation (min)  40 min    Activity Tolerance  Patient tolerated treatment well   Pt rates hip pain as 0/10 at end of session   Behavior During Therapy  [facility] for tasks assessed/performed       Past Medical History:  Diagnosis Date  . Atrial fibrillation ([place])    onset 07/17/17  . Dysrhythmia   . ED (erectile dysfunction)   . GERD 07/01/2007  . Parkinson's disease ([place])   . 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 PROSTATE SPECIFIC ANTIGEN, ELEVATED 06/29/2007  . Rheumatoid arthritis ([place])   . SEIZURE DISORDER 07/01/2007   one epsiode only  . Spinal stenosis of lumbar region 09/22/2014    Past Surgical History:  Procedure Laterality Date  . AMPUTATION     right great toe - tramatic age 16  . CARDIOVERSION N/A 03/15/2018   Procedure: CARDIOVERSION;  Surgeon: [name], MD;  Location: [facility] ENDOSCOPY;  Service: Cardiovascular;  Laterality: N/A;  . CARDIOVERSION N/A 12/13/2018   Procedure: CARDIOVERSION;  Surgeon: Sanda Klein, MD;  Location: [facility] ENDOSCOPY;  Service: Cardiovascular;  Laterality: N/A;  . CARDIOVERSION N/A 02/25/2019   Procedure: CARDIOVERSION;  Surgeon: [name], MD;  Location: [facility] ENDOSCOPY;  Service: Cardiovascular;  Laterality: N/A;  . COLONOSCOPY    . DEBRIDEMENT TOE Right 07/2016  . HAND SURGERY  03/2017   removal cysts  . IR FLUORO GUIDE CV LINE RIGHT  10/16/2016  . IR  US GUIDE VASC ACCESS RIGHT  10/16/2016  . OSTECTOMY Right 08/05/2017   Procedure: OSTECTOMY COMPLETE METARSAL HEAD FIRST RIGHT;  Surgeon: Trula Slade, DPM;  Location: [facility];  Service: Podiatry;  Laterality: Right;    Vitals:   03/04/19 0804  BP: 111/68  Pulse: 80    Subjective Assessment - 03/04/19 0804    Subjective  Tried the exercises a little this morning.    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  Yes    Pain Score  3     Pain Location  Hip    Pain Orientation  Right    Pain Descriptors / Indicators  Burning    Pain Type  Chronic pain    Pain Onset  More than a month ago    Pain Frequency  Intermittent    Aggravating Factors   driving, sitting    Pain Relieving Factors  change of positions, injections                       [facility] Adult PT Treatment/Exercise - 03/04/19 0001      Lumbar Exercises: Stretches  Active Hamstring Stretch  Right;Left;2 reps;30 seconds    Active Hamstring Stretch Limitations  Foot propped at edge of mat    Other Lumbar Stretch Exercise  Standing widened BOS with anterior/posterior weightshifting, x 10 reps, then stagger stance forward/back weigthshifting at counterx 12 reps eacj        PWR [facility]) - 03/04/19 0807    PWR! exercises  Moves in supine;Moves in [place];Moves in sitting;Moves in standing    PWR! Up  x 10 reps -UEs supported at bench    PWR! Rock  x 10 reps forward/back rocking, UEs supported on bench    PWR! Twist  x 10 reps each side-UEs on mat, on all 4's, cues for looking at hand    Comments  PWR! Moves in quadruped, cues for technique (tried with UEs at bench simulating sofa for UE support)    PWR! Up  x 5 reps through shoulders, x 10 reps through hips    PWR! Rock  x 5 reps each side, then 5 reps alternating  side    PWR! Twist  x 5 reps alternating sides with flexed knees, then x 5 reps each side, extended knee, cues to push through heel for improved stretch through hips    PWR! Step  x 10 reps single leg step out and in    Comments  PWR! Moves in supine, cues for technique    PWR! Rock  2 sets x 10 reps lateral rocking, with added reaching in standing.    PWR! Rock  lateral weigthshifting through hips, x 10 reps    PWR! Step  x 10 reps single leg step out and in; then simulating car, step out/together, then reverse, x 8 reps    Comments  PWR! Moves in sitting-cues for technique, intensity, connection to function               PT Long Term Goals - 02/18/19 0925      PT LONG TERM GOAL #1   Title  Pt will be independent with HEP to address Parkinson's deficits, pain, balance.    Time  4    Period  Weeks    Status  New    Target Date  03/18/19      PT LONG TERM GOAL #2   Title  Pt will perform 5x sit<>stand in less than or equal to 13 sec or improved transfer efficiency and strength.    Time  4    Period  Weeks    Status  New    Target Date  03/18/19      PT LONG TERM GOAL #3   Title  Pt will improve DGI score to at least 20/24 for decreased fall risk.    Time  4    Period  Weeks    Status  New    Target Date  03/18/19      PT LONG TERM GOAL #4   Title  Pt will verbalize understanding of fall prevention in the home environment.    Time  4    Period  Weeks    Status  New    Target Date  03/18/19            Plan - 03/04/19 1215    Clinical Impression Statement  Reviewed HEP given last visit, for supine and quadruped PWR! Moves.  Pt demo understanding with cues, and worked today in combination of modified quadruped (with UE's supported at bench);given that he just was here yesterday, may  review an additional time to make sure pt's full understanding.  Also worked in sitting and standing to help with RLE lifting for car trasnfers as well as initial standing balance.  Pt  will continue to beneift from skilled PT to address flexibility, balance, gait for improved overall functional mobility.    Personal Factors and Comorbidities  Comorbidity 3+    Comorbidities  See PMH-PD, A-fib, great toe amputation RLE    Examination-Activity Limitations  Sit;Stairs;Transfers;Locomotion Level    Examination-Participation Restrictions  Community Activity   Return to gym activities   Stability/Clinical Decision Making  Evolving/Moderate complexity    Rehab Potential  Good    PT Frequency  2x / week    PT Duration  4 weeks   plus eval; total POC = 5 weeks   PT Treatment/Interventions  ADLs/Self Care Home Management;Stair training;Functional mobility training;Therapeutic activities;Therapeutic exercise;Balance training;Neuromuscular re-education;Gait training;DME Instruction;Patient/family education;Manual techniques    PT Next Visit Plan  Review PWR! Moves given as HEP (supine and quadruped); work on getting in and out of car (maybe add seated PWR! Moves), standing PWR! Moves, standing balance and gait    Consulted and Agree with Plan of Care  Patient       Patient will benefit from skilled therapeutic intervention in order to improve the following deficits and impairments:  Abnormal gait, Decreased balance, Decreased mobility, Decreased strength, Impaired flexibility, Postural dysfunction, Difficulty walking, Pain  Visit Diagnosis: Muscle weakness (generalized)  Abnormal posture     Problem List Patient Active Problem List   Diagnosis Date Noted  . Obstructive sleep apnea 05/31/2018  . Full thickness rotator cuff tear 03/16/2018  . Scoliosis deformity of spine 03/02/2018  . Acute low back pain 12/18/2017  . Degeneration of lumbosacral intervertebral disc 10/05/2017  . HTN (hypertension) 09/21/2017  . Bilateral leg edema 09/21/2017  . Osteomyelitis of toe of right foot ([place]) 08/05/2017  . Rheumatoid arthritis, adult ([place]) 08/05/2017  . Parkinson disease ([place])  08/05/2017  . HLD (hyperlipidemia) 08/05/2017  . Paroxysmal atrial fibrillation ([place]) 08/05/2017  . Normocytic anemia 08/05/2017  . Persistent atrial fibrillation ([place]) 07/22/2017  . ED (erectile dysfunction) 04/20/2012  . GERD 07/01/2007  . SEIZURE DISORDER 07/01/2007  . Dyslipidemia 06/29/2007    Olene Godfrey W. 03/04/2019, 12:18 PM  Frazier Butt., PT   [facility] [address] [place], [place], [address] Phone: [phone]   Fax:  [phone]  Name: Samuel Moran MRN: [id number] Date of Birth: 05-Jul-1945

## 2019-03-07 ENCOUNTER — Encounter: Payer: Self-pay | Admitting: Physical Therapy

## 2019-03-07 ENCOUNTER — Other Ambulatory Visit: Payer: Self-pay

## 2019-03-07 ENCOUNTER — Ambulatory Visit: Payer: Medicare HMO | Attending: Neurology | Admitting: Physical Therapy

## 2019-03-07 DIAGNOSIS — R2689 Other abnormalities of gait and mobility: Secondary | ICD-10-CM | POA: Diagnosis not present

## 2019-03-07 DIAGNOSIS — R2681 Unsteadiness on feet: Secondary | ICD-10-CM | POA: Diagnosis not present

## 2019-03-07 DIAGNOSIS — M6281 Muscle weakness (generalized): Secondary | ICD-10-CM | POA: Diagnosis not present

## 2019-03-07 DIAGNOSIS — R293 Abnormal posture: Secondary | ICD-10-CM | POA: Diagnosis not present

## 2019-03-07 NOTE — Therapy (Signed)
[facility] [address] [place], [place], [address] Phone: [phone]   Fax:  [phone]  Physical Therapy Treatment  Patient Details  Name: Samuel Moran MRN: [id number] Date of Birth: 20-Jun-1945 Referring Provider (PT): Tat, Wells Guiles   Encounter Date: 03/07/2019  PT End of Session - 03/07/19 1012    Visit Number  4    Number of Visits  9    Date for PT Re-Evaluation  04/19/19    Authorization Type  [employer] Medicare-will need 10th visit progress note    PT Start Time  0804    PT Stop Time  0844    PT Time Calculation (min)  40 min    Activity Tolerance  Patient tolerated treatment well;Patient limited by pain   Increased pain to 7/10 at end of session   Behavior During Therapy  [facility] for tasks assessed/performed       Past Medical History:  Diagnosis Date  . Atrial fibrillation ([place])    onset 07/17/17  . Dysrhythmia   . ED (erectile dysfunction)   . GERD 07/01/2007  . Parkinson's disease ([place])   . 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 PROSTATE SPECIFIC ANTIGEN, ELEVATED 06/29/2007  . Rheumatoid arthritis ([place])   . SEIZURE DISORDER 07/01/2007   one epsiode only  . Spinal stenosis of lumbar region 09/22/2014    Past Surgical History:  Procedure Laterality Date  . AMPUTATION     right great toe - tramatic age 56  . CARDIOVERSION N/A 03/15/2018   Procedure: CARDIOVERSION;  Surgeon: [name], MD;  Location: [facility] ENDOSCOPY;  Service: Cardiovascular;  Laterality: N/A;  . CARDIOVERSION N/A 12/13/2018   Procedure: CARDIOVERSION;  Surgeon: Sanda Klein, MD;  Location: [facility] ENDOSCOPY;  Service: Cardiovascular;  Laterality: N/A;  . CARDIOVERSION N/A 02/25/2019   Procedure: CARDIOVERSION;  Surgeon: [name], MD;  Location: [facility] ENDOSCOPY;  Service: Cardiovascular;  Laterality: N/A;  . COLONOSCOPY    . DEBRIDEMENT TOE Right 07/2016  . HAND SURGERY  03/2017   removal cysts  . IR FLUORO GUIDE CV LINE RIGHT   10/16/2016  . IR US GUIDE VASC ACCESS RIGHT  10/16/2016  . OSTECTOMY Right 08/05/2017   Procedure: OSTECTOMY COMPLETE METARSAL HEAD FIRST RIGHT;  Surgeon: Trula Slade, DPM;  Location: [facility];  Service: Podiatry;  Laterality: Right;    There were no vitals filed for this visit.  Subjective Assessment - 03/07/19 0806    Subjective  No changes, typical back soreness, more yesterday than today.    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  Yes    Pain Score  3     Pain Location  Hip    Pain Orientation  Right    Pain Descriptors / Indicators  Pressure    Pain Type  Chronic pain    Pain Onset  More than a month ago    Pain Frequency  Intermittent    Aggravating Factors   driving, sitting    Pain Relieving Factors  movement                       [facility] Adult PT Treatment/Exercise - 03/07/19 0001      High Level Balance   High Level Balance  Comments  Practice squats to pick up objects from floor:  beginning with squats to touch cones 2 sets x 3 reps, then squat to pick up cones, x 3 reps.  Cues for widened BOS and to get as close as possible to object being picked up to avoid forward lean and LOB        [facility]) - 03/07/19 0810    PWR! Up  10 reps     PWR! Rock  x 10 reps     PWR! Twist  x 10 reps each side (performed in corner reaching across body to touch wall)    PWR Step  x 10 reps side    Comments  PWR! Moves in standing performed with verbal, visual cues, for flexibility, squat technique, posture and step strategy       Balance Exercises - 03/07/19 0823      Balance Exercises: Standing   Standing Eyes Opened  Wide (BOA);Head turns;Solid surface;5 reps;Narrow base of support (BOS)   Head nods; then on foam: wide BOS with head turns/nods   Standing Eyes  Closed  Wide (BOA);Narrow base of support (BOS);Head turns;Solid surface;5 reps   Head nods; on foam:  head turns/nods x 5 reps   Stepping Strategy  Anterior;Posterior;UE support;10 reps   Cues for technique   Sidestepping  Upper extremity support;3 reps   With added squats along counter   Other Standing Exercises  Stagger stance forward/back rocking x 10 reps each   Cues for technique, for improved weigthshifting A/P directio            PT Long Term Goals - 02/18/19 0925      PT LONG TERM GOAL #1   Title  Pt will be independent with HEP to address Parkinson's deficits, pain, balance.    Time  4    Period  Weeks    Status  New    Target Date  03/18/19      PT LONG TERM GOAL #2   Title  Pt will perform 5x sit<>stand in less than or equal to 13 sec or improved transfer efficiency and strength.    Time  4    Period  Weeks    Status  New    Target Date  03/18/19      PT LONG TERM GOAL #3   Title  Pt will improve DGI score to at least 20/24 for decreased fall risk.    Time  4    Period  Weeks    Status  New    Target Date  03/18/19      PT LONG TERM GOAL #4   Title  Pt will verbalize understanding of fall prevention in the home environment.    Time  4    Period  Weeks    Status  New    Target Date  03/18/19            Plan - 03/07/19 1012    Clinical Impression Statement  Focus of today's skilled PT session was standing balance, standing flexibility and weigthshifting, as well as education/practice of safe squatting technique (given pt's fall on Friday from squatting position).  Pt needs several rest breaks to perform self-stretching to R hip and low back, and he reports increase in pain to 7/10 at end of session, after static standing balance exercises.  Pt will continue to benefit from skilled PT to address flexibility, posture and balance for overall improved funcitonal mobility.    Personal Factors  and Comorbidities  Comorbidity 3+    Comorbidities  See PMH-PD,  A-fib, great toe amputation RLE    Examination-Activity Limitations  Sit;Stairs;Transfers;Locomotion Level    Examination-Participation Restrictions  Community Activity   Return to gym activities   Stability/Clinical Decision Making  Evolving/Moderate complexity    Rehab Potential  Good    PT Frequency  2x / week    PT Duration  4 weeks   plus eval; total POC = 5 weeks   PT Treatment/Interventions  ADLs/Self Care Home Management;Stair training;Functional mobility training;Therapeutic activities;Therapeutic exercise;Balance training;Neuromuscular re-education;Gait training;DME Instruction;Patient/family education;Manual techniques    PT Next Visit Plan  Review PWR! Moves given as HEP (supine and quadruped)-as needed (may do these after standing balance exercises to stretch low back/R hip); standing corner balance, standing PWR! Moves to address squat technique, posture, flexibility    Consulted and Agree with Plan of Care  Patient       Patient will benefit from skilled therapeutic intervention in order to improve the following deficits and impairments:  Abnormal gait, Decreased balance, Decreased mobility, Decreased strength, Impaired flexibility, Postural dysfunction, Difficulty walking, Pain  Visit Diagnosis: Unsteadiness on feet  Abnormal posture  Muscle weakness (generalized)     Problem List Patient Active Problem List   Diagnosis Date Noted  . Obstructive sleep apnea 05/31/2018  . Full thickness rotator cuff tear 03/16/2018  . Scoliosis deformity of spine 03/02/2018  . Acute low back pain 12/18/2017  . Degeneration of lumbosacral intervertebral disc 10/05/2017  . HTN (hypertension) 09/21/2017  . Bilateral leg edema 09/21/2017  . Osteomyelitis of toe of right foot ([place]) 08/05/2017  . Rheumatoid arthritis, adult ([place]) 08/05/2017  . Parkinson disease ([place]) 08/05/2017  . HLD (hyperlipidemia) 08/05/2017  . Paroxysmal atrial fibrillation ([place]) 08/05/2017  . Normocytic anemia  08/05/2017  . Persistent atrial fibrillation ([place]) 07/22/2017  . ED (erectile dysfunction) 04/20/2012  . GERD 07/01/2007  . SEIZURE DISORDER 07/01/2007  . Dyslipidemia 06/29/2007    Samuel Cline W. 03/07/2019, 10:17 AM  Frazier Butt., PT  [facility] [address] [place], [place], [address] Phone: [phone]   Fax:  [phone]  Name: Samuel Moran MRN: [id number] Date of Birth: 05/09/1945

## 2019-03-08 ENCOUNTER — Ambulatory Visit (INDEPENDENT_AMBULATORY_CARE_PROVIDER_SITE_OTHER): Payer: Medicare HMO | Admitting: Cardiology

## 2019-03-08 ENCOUNTER — Encounter: Payer: Self-pay | Admitting: Cardiology

## 2019-03-08 VITALS — BP 130/84 | HR 67 | Ht 71.0 in | Wt 220.0 lb

## 2019-03-08 DIAGNOSIS — I4819 Other persistent atrial fibrillation: Secondary | ICD-10-CM

## 2019-03-08 NOTE — Progress Notes (Signed)
Electrophysiology Office Note   Date:  03/08/2019   ID:  Samuel Moran, Samuel Moran November 06, 1945, MRN [id number]  PCP:  Park Liter, MD  Cardiologist:  Agustin Cree Primary Electrophysiologist:  Ana Woodroof Meredith Leeds, MD    Chief Complaint: AF   History of Present Illness: Samuel Moran is a 73 y.o. male who is being seen today for the evaluation of AF at the request of Park Liter, MD. Presenting today for electrophysiology evaluation.  History significant for persistent atrial fibrillation, Parkinson's disease, seizures.  Atrial fibrillation began March 2019.  At that time, he was cardioverted, but quickly went back into atrial fibrillation.  He was started on low-dose flecainide and cardioverted back to sinus rhythm.  He remained in sinus rhythm for a few days, but again went back into atrial fibrillation.  He had a cardioversion after an increased dose of flecainide, but unfortunately went back into atrial fibrillation.  Today, denies symptoms of palpitations, chest pain, shortness of breath, orthopnea, PND, lower extremity edema, claudication, dizziness, presyncope, syncope, bleeding, or neurologic sequela. The patient is tolerating medications without difficulties.  He had a recent cardioversion, but went back into atrial fibrillation quite quickly.  He has shortness of breath, weakness, and fatigue.   Past Medical History:  Diagnosis Date  . Atrial fibrillation ([place])    onset 07/17/17  . Dysrhythmia   . ED (erectile dysfunction)   . GERD 07/01/2007  . Parkinson's disease ([place])   . 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 PROSTATE SPECIFIC ANTIGEN, ELEVATED 06/29/2007  . Rheumatoid arthritis ([place])   . SEIZURE DISORDER 07/01/2007   one epsiode only  . Spinal stenosis of lumbar region 09/22/2014   Past Surgical History:  Procedure Laterality Date  . AMPUTATION     right great toe - tramatic age 53  . CARDIOVERSION N/A 03/15/2018   Procedure: CARDIOVERSION;  Surgeon:  [name], MD;  Location: [facility] ENDOSCOPY;  Service: Cardiovascular;  Laterality: N/A;  . CARDIOVERSION N/A 12/13/2018   Procedure: CARDIOVERSION;  Surgeon: Sanda Klein, MD;  Location: [facility] ENDOSCOPY;  Service: Cardiovascular;  Laterality: N/A;  . CARDIOVERSION N/A 02/25/2019   Procedure: CARDIOVERSION;  Surgeon: [name], MD;  Location: [facility] ENDOSCOPY;  Service: Cardiovascular;  Laterality: N/A;  . COLONOSCOPY    . DEBRIDEMENT TOE Right 07/2016  . HAND SURGERY  03/2017   removal cysts  . IR FLUORO GUIDE CV LINE RIGHT  10/16/2016  . IR US GUIDE VASC ACCESS RIGHT  10/16/2016  . OSTECTOMY Right 08/05/2017   Procedure: OSTECTOMY COMPLETE METARSAL HEAD FIRST RIGHT;  Surgeon: Trula Slade, DPM;  Location: [facility];  Service: Podiatry;  Laterality: Right;     Current Outpatient Medications  Medication Sig Dispense Refill  . B Complex-C (SUPER B COMPLEX PO) Take 1 tablet by mouth every evening.    . carbidopa-levodopa (SINEMET IR) 25-100 MG tablet Take 1 tablet by mouth 4 (four) times daily. Take at 7am/11am/3pm/7pm 360 tablet 1  . carvedilol (COREG) 12.5 MG tablet Take 1 tablet (12.5 mg total) by mouth 2 (two) times daily. 60 tablet 3  . ELIQUIS 5 MG TABS tablet TAKE 1 TABLET BY MOUTH TWICE A DAY 60 tablet 6  . flecainide (TAMBOCOR) 100 MG tablet Take 1 tablet (100 mg total) by mouth 2 (two) times daily. 180 tablet 1  . furosemide (LASIX) 20 MG tablet Take 1 tablet (20 mg total) by mouth daily. 5 tablet 0  . gabapentin (NEURONTIN) 100 MG capsule Take 100 mg by  mouth 3 (three) times daily.    Marland Kitchen ibuprofen (ADVIL,MOTRIN) 200 MG tablet Take 800 mg by mouth 2 (two) times daily as needed (for pain/headaches.).     Marland Kitchen leflunomide (ARAVA) 20 MG tablet Take 20 mg by mouth daily.    . metoprolol succinate (TOPROL-XL) 25 MG 24 hr tablet Take 25 mg by mouth daily.    . Multiple Vitamin (MULTIVITAMIN WITH MINERALS) TABS tablet Take 1 tablet by mouth daily. Centrum Silver    . NEUPRO 4 MG/24HR  APPLY 1 PATCH ONTO THE SKIN EVERY DAY 30 patch 5  . Omega-3 Fatty Acids (FISH OIL) 1200 MG CPDR Take 1,200 mg by mouth daily.     No current facility-administered medications for this visit.     Allergies:   Patient has no known allergies.   Social History:  The patient  reports that he has been smoking cigars. He has never used smokeless tobacco. He reports current alcohol use of about 4.0 standard drinks of alcohol per week. He reports that he does not use drugs.   Family History:  The patient's family history includes Alcoholism in his mother; Arthritis in his father and mother.   ROS:  Please see the history of present illness.   Otherwise, review of systems is positive for none.   All other systems are reviewed and negative.   PHYSICAL EXAM: VS:  BP 130/84   Pulse 67   Ht 5\' 11"  (1.803 m)   Wt 220 lb (99.8 kg)   SpO2 92%   BMI 30.68 kg/m  , BMI Body mass index is 30.68 kg/m. GEN: Well nourished, well developed, in no acute distress  HEENT: normal  Neck: no JVD, carotid bruits, or masses Cardiac: irregular; no murmurs, rubs, or gallops,no edema  Respiratory:  clear to auscultation bilaterally, normal work of breathing GI: soft, nontender, nondistended, + BS MS: no deformity or atrophy  Skin: warm and dry Neuro:  Strength and sensation are intact Psych: euthymic mood, full affect  EKG:  EKG is not ordered today. Personal review of the ekg ordered 02/26/19 shows sinus rhythm, first-degree AV block, rate 77  Recent Labs: 02/25/2019: Hemoglobin 12.5; Platelets 202 03/01/2019: BNP 355.1; BUN 28; Creatinine, Ser 1.13; Potassium 4.2; Sodium 138    Lipid Panel     Component Value Date/Time   CHOL 168 02/26/2017 0922   TRIG 55.0 02/26/2017 0922   HDL 52.90 02/26/2017 0922   CHOLHDL 3 02/26/2017 0922   VLDL 11.0 02/26/2017 0922   LDLCALC 104 (H) 02/26/2017 0922     Wt Readings from Last 3 Encounters:  03/08/19 220 lb (99.8 kg)  03/01/19 218 lb (98.9 kg)  02/26/19  215 lb 12.8 oz (97.9 kg)      Other studies Reviewed: Additional studies/ records that were reviewed today include: Myoview 04/12/18  Review of the above records today demonstrates:   Nuclear stress EF: 59%.  No T wave inversion was noted during stress.  There was no ST segment deviation noted during stress.  The study is normal.  This is a low risk study.  TTE 07/27/17 - Left ventricle: The cavity size was normal. Wall thickness was   increased in a pattern of moderate LVH. Indeterminant diastolic   function (atrial fibrillation). Systolic function was normal. The   estimated ejection fraction was in the range of 55% to 60%. Wall   motion was normal; there were no regional wall motion   abnormalities. - Aortic valve: There was no stenosis. - Mitral  valve: There was trivial regurgitation. - Left atrium: The atrium was moderately dilated. - Right ventricle: The cavity size was normal. Systolic function   was normal. - Right atrium: The atrium was mildly dilated. - Tricuspid valve: Peak RV-RA gradient (S): 31 mm Hg. - Pulmonary arteries: PA peak pressure: 39 mm Hg (S). - Systemic veins: IVC measured 2.2 with > 50% respirophasic   variation, suggesting RA pressure 8 mmHg.  Monitor 10/22/17 personally reviewed Baseline rhythm: Atrial fibrillation Minimum heart rate: 55 BPM.  Average heart rate: 87 BPM.  Maximal heart rate 115 BPM. Atrial arrhythmia: Atrial fibrillation alter the recordin Ventricular arrhythmia: 622 premature ventricular beats with 2 ventricular couplets  ASSESSMENT AND PLAN:  1.  Persistent atrial fibrillation: Currently on Eliquis, flecainide, carvedilol.  His flecainide dose was increased, but he had further episodes of atrial fibrillation.  He would thus prefer to have ablation.  Risks and benefits were discussed which include bleeding, tamponade, heart block, stroke, damage surrounding organs.  He has a friend who is a Marine scientist and he would like to discuss this  with her.  We Perkins Molina give him information on ablation.  We also discussed starting medication such as amiodarone or dofetilide.  This patients CHA2DS2-VASc Score and unadjusted Ischemic Stroke Rate (% per year) is equal to 2.2 % stroke rate/year from a score of 2  Above score calculated as 1 point each if present [CHF, HTN, DM, Vascular=MI/PAD/Aortic Plaque, Age if 65-74, or Male] Above score calculated as 2 points each if present [Age > 75, or Stroke/TIA/TE]   2.  Hypertension: Currently well controlled  3.  Obstructive sleep apnea: CPAP compliance encouraged  Current medicines are reviewed at length with the patient today.   The patient does not have concerns regarding his medicines.  The following changes were made today: None  Labs/ tests ordered today include:  No orders of the defined types were placed in this encounter.  Disposition:   FU with Adrianna Dudas 3 months  Signed, Mikesha Migliaccio Meredith Leeds, MD  03/08/2019 12:41 PM     [facility] [address] [place] [address] [phone] (office) [phone] (fax)

## 2019-03-08 NOTE — Patient Instructions (Addendum)
Medication Instructions:  Your physician recommends that you continue on your current medications as directed. Please refer to the Current Medication list given to you today.  * If you need a refill on your cardiac medications before your next appointment, please call your pharmacy.   Labwork: None ordered  Testing/Procedures: None ordered  Follow-Up: At [facility], you and your health needs are our priority.  As part of our continuing mission to provide you with exceptional heart care, we have created designated Provider Care Teams.  These Care Teams include your primary Cardiologist (physician) and Advanced Practice Providers (APPs -  Physician Assistants and Nurse Practitioners) who all work together to provide you with the care you need, when you need it.  You will need a follow up appointment in 3 months.  Please call our office 2 months in advance to schedule this appointment.  You may see Dr Curt Bears or one of the following Advanced Practice Providers on your designated Care Team:    Chanetta Marshall, NP  Tommye Standard, PA-C  Oda Kilts, [place]  Thank you for choosing [facility]!!   Trinidad Curet, RN [phone]  Any Other Special Instructions Will Be Listed Below (If Applicable).    Cardiac Ablation Cardiac ablation is a procedure to disable (ablate) a small amount of heart tissue in very specific places. The heart has many electrical connections. Sometimes these connections are abnormal and can cause the heart to beat very fast or irregularly. Ablating some of the problem areas can improve the heart rhythm or return it to normal. Ablation may be done for people who:  Have Wolff-Parkinson-White syndrome.  Have fast heart rhythms (tachycardia).  Have taken medicines for an abnormal heart rhythm (arrhythmia) that were not effective or caused side effects.  Have a high-risk heartbeat that may be life-threatening. During the procedure, a small incision is made in the  neck or the groin, and a long, thin, flexible tube (catheter) is inserted into the incision and moved to the heart. Small devices (electrodes) on the tip of the catheter will send out electrical currents. A type of X-ray (fluoroscopy) will be used to help guide the catheter and to provide images of the heart. Tell a health care provider about:  Any allergies you have.  All medicines you are taking, including vitamins, herbs, eye drops, creams, and over-the-counter medicines.  Any problems you or family members have had with anesthetic medicines.  Any blood disorders you have.  Any surgeries you have had.  Any medical conditions you have, such as kidney failure.  Whether you are pregnant or may be pregnant. What are the risks? Generally, this is a safe procedure. However, problems may occur, including:  Infection.  Bruising and bleeding at the catheter insertion site.  Bleeding into the chest, especially into the sac that surrounds the heart. This is a serious complication.  Stroke or blood clots.  Damage to other structures or organs.  Allergic reaction to medicines or dyes.  Need for a permanent pacemaker if the normal electrical system is damaged. A pacemaker is a small computer that sends electrical signals to the heart and helps your heart beat normally.  The procedure not being fully effective. This may not be recognized until months later. Repeat ablation procedures are sometimes required. What happens before the procedure?  Follow instructions from your health care provider about eating or drinking restrictions.  Ask your health care provider about: ? Changing or stopping your regular medicines. This is especially important if  you are taking diabetes medicines or blood thinners. ? Taking medicines such as aspirin and ibuprofen. These medicines can thin your blood. Do not take these medicines before your procedure if your health care provider instructs you not  to.  Plan to have someone take you home from the hospital or clinic.  If you will be going home right after the procedure, plan to have someone with you for 24 hours. What happens during the procedure?  To lower your risk of infection: ? Your health care team will wash or sanitize their hands. ? Your skin will be washed with soap. ? Hair may be removed from the incision area.  An IV tube will be inserted into one of your veins.  You will be given a medicine to help you relax (sedative).  The skin on your neck or groin will be numbed.  An incision will be made in your neck or your groin.  A needle will be inserted through the incision and into a large vein in your neck or groin.  A catheter will be inserted into the needle and moved to your heart.  Dye may be injected through the catheter to help your surgeon see the area of the heart that needs treatment.  Electrical currents will be sent from the catheter to ablate heart tissue in desired areas. There are three types of energy that may be used to ablate heart tissue: ? Heat (radiofrequency energy). ? Laser energy. ? Extreme cold (cryoablation).  When the necessary tissue has been ablated, the catheter will be removed.  Pressure will be held on the catheter insertion area to prevent excessive bleeding.  A bandage (dressing) will be placed over the catheter insertion area. The procedure may vary among health care providers and hospitals. What happens after the procedure?  Your blood pressure, heart rate, breathing rate, and blood oxygen level will be monitored until the medicines you were given have worn off.  Your catheter insertion area will be monitored for bleeding. You will need to lie still for a few hours to ensure that you do not bleed from the catheter insertion area.  Do not drive for 24 hours or as long as directed by your health care provider. Summary  Cardiac ablation is a procedure to disable (ablate) a  small amount of heart tissue in very specific places. Ablating some of the problem areas can improve the heart rhythm or return it to normal.  During the procedure, electrical currents will be sent from the catheter to ablate heart tissue in desired areas. This information is not intended to replace advice given to you by your health care provider. Make sure you discuss any questions you have with your health care provider. Document Released: 09/07/2008 Document Revised: 10/12/2017 Document Reviewed: 03/10/2016 Elsevier Patient Education  2020 [employer].

## 2019-03-09 ENCOUNTER — Other Ambulatory Visit: Payer: Self-pay

## 2019-03-09 ENCOUNTER — Ambulatory Visit: Payer: Medicare HMO | Admitting: Physical Therapy

## 2019-03-09 DIAGNOSIS — R2689 Other abnormalities of gait and mobility: Secondary | ICD-10-CM

## 2019-03-09 DIAGNOSIS — M6281 Muscle weakness (generalized): Secondary | ICD-10-CM | POA: Diagnosis not present

## 2019-03-09 DIAGNOSIS — R293 Abnormal posture: Secondary | ICD-10-CM | POA: Diagnosis not present

## 2019-03-09 DIAGNOSIS — R2681 Unsteadiness on feet: Secondary | ICD-10-CM | POA: Diagnosis not present

## 2019-03-09 NOTE — Therapy (Signed)
[facility] [address] [place] [place], [place], [address] Phone: [phone]   Fax:  [phone]  Physical Therapy Treatment  Patient Details  Name: Samuel Moran MRN: [id number] Date of Birth: [birth year] Referring Provider (PT): Tat, Wells Guiles   Encounter Date: 03/09/2019  PT End of Session - 03/09/19 1114    Visit Number  5    Number of Visits  9    Date for PT Re-Evaluation  04/19/19    Authorization Type  [employer] Medicare-will need 10th visit progress note    PT Start Time  0846    PT Stop Time  0930    PT Time Calculation (min)  44 min    Activity Tolerance  Patient tolerated treatment well;Patient limited by pain   exercises limited due to reports of R shoulder pain   Behavior During Therapy  [facility] for tasks assessed/performed       Past Medical History:  Diagnosis Date  . Atrial fibrillation ([place])    onset 07/17/17  . Dysrhythmia   . ED (erectile dysfunction)   . GERD 07/01/2007  . Parkinson's disease ([place])   . 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 PROSTATE SPECIFIC ANTIGEN, ELEVATED 06/29/2007  . Rheumatoid arthritis ([place])   . SEIZURE DISORDER 07/01/2007   one epsiode only  . Spinal stenosis of lumbar region 09/22/2014    Past Surgical History:  Procedure Laterality Date  . AMPUTATION     right great toe - tramatic age 5  . CARDIOVERSION N/A 03/15/2018   Procedure: CARDIOVERSION;  Surgeon: [name], MD;  Location: [facility] ENDOSCOPY;  Service: Cardiovascular;  Laterality: N/A;  . CARDIOVERSION N/A 12/13/2018   Procedure: CARDIOVERSION;  Surgeon: Sanda Klein, MD;  Location: [facility] ENDOSCOPY;  Service: Cardiovascular;  Laterality: N/A;  . CARDIOVERSION N/A 02/25/2019   Procedure: CARDIOVERSION;  Surgeon: [name], MD;  Location: [facility] ENDOSCOPY;  Service: Cardiovascular;  Laterality: N/A;  . COLONOSCOPY    . DEBRIDEMENT TOE Right 07/2016  . HAND SURGERY  03/2017   removal cysts  . IR FLUORO GUIDE CV  LINE RIGHT  10/16/2016  . IR US GUIDE VASC ACCESS RIGHT  10/16/2016  . OSTECTOMY Right 08/05/2017   Procedure: OSTECTOMY COMPLETE METARSAL HEAD FIRST RIGHT;  Surgeon: Trula Slade, DPM;  Location: [facility];  Service: Podiatry;  Laterality: Right;    There were no vitals filed for this visit.  Subjective Assessment - 03/09/19 0849    Subjective  Back is feeling better. Has been doing some of the exercises at home, including the supine hip flexor stretch.    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  Yes    Pain Score  2     Pain Location  Back    Pain Orientation  Right    Pain Descriptors / Indicators  Pressure    Pain Type  Chronic pain    Pain Onset  More than a month ago                               [facility] Adult PT Treatment/Exercise - 03/09/19 0001      Transfers   Transfers  Sit to Stand;Stand to Sit  Sit to Stand  5: Supervision;6: Modified independent (Device/Increase time)    Sit to Stand Details  Verbal cues for sequencing;Verbal cues for technique    Sit to Stand Details (indicate cue type and reason)  4 reps each of sit <> stands from different surfaces without UE support: low blue mat table, standard arm chair, low gray chair with no arm support - cues for anterior weight shifting as initially when pt stands, tends to brace BLEs against mat table          Pt performs PWR! Moves in supine position x 10 reps   PWR! Up for improved posture   PWR! Rock for improved weighshifting - cues for alternating between R and L and technique   PWR! Twist for improved trunk rotation  cues for alternating between R and L and technique for posture  PWR! Step for improved step initiation - initial demonstrative cues to perform correctly, verbal cues during, with  pt able to correctly perform during last 4 reps independently.      Pt performs PWR! Moves in standing position 2 x 10 reps- at edge of mat table with chair anteriorly for intermittent UE support.    PWR! Up for improved posture  PWR! Rock for improved weighshifting - 1 x 10 reps with reaching with BUE, pt reporting that his right shoulder is bothersome with reaching (has been bothersome for approx. A year since after he reports a fall where he hit his shoulder) - but feels better after movement. Performed 1 x 10 reps lateral weight shifting without UE reaching for 2nd rep.   PWR! Step for improved step initiation - with single UE support on chair, cues for bigger steps, 2nd rep performing without lateral reaching due to pt reporting increased R shoulder discomfort.       PT Education - 03/09/19 1114    Education Details  reviewed PWR! supine moves for HEP - educated pt on importance of performing and technique/sequencing, follow up with physician/PCP regarding R shoulder pain due to pt not previously mentioning to an MD before after a fall and has been bothersome for approx. a year.    Person(s) Educated  Patient    Methods  Explanation;Demonstration    Comprehension  Verbalized understanding;Returned demonstration          PT Long Term Goals - 02/18/19 0925      PT LONG TERM GOAL #1   Title  Pt will be independent with HEP to address Parkinson's deficits, pain, balance.    Time  4    Period  Weeks    Status  New    Target Date  03/18/19      PT LONG TERM GOAL #2   Title  Pt will perform 5x sit<>stand in less than or equal to 13 sec or improved transfer efficiency and strength.    Time  4    Period  Weeks    Status  New    Target Date  03/18/19      PT LONG TERM GOAL #3   Title  Pt will improve DGI score to at least 20/24 for decreased fall risk.    Time  4    Period  Weeks    Status  New    Target Date  03/18/19      PT LONG TERM GOAL #4   Title  Pt will  verbalize understanding of fall prevention in the home environment.    Time  4  Period  Weeks    Status  New    Target Date  03/18/19            Plan - 03/09/19 1120    Clinical Impression Statement  Focus of today's skilled session was reviewing pt's supine PWR! moves HEP, standing PWR! moves, and sit <> stands from various surfaces/heights without UE support. Pt reported no increase in back/hip pain throughout session - reported relief from movement. Pt had increased R shoulder discomfort with PWR! standing lateral reaching that subsided with rest, and pt reports this discomfort gets better with movement. Was not bothersome during supine PWR! moves. Pt will continue to benefit from skilled PT to addres, flexibility, posture, and balance to improve functional mobility and decrease fall risk.    Personal Factors and Comorbidities  Comorbidity 3+    Comorbidities  See PMH-PD, A-fib, great toe amputation RLE    Examination-Activity Limitations  Sit;Stairs;Transfers;Locomotion Level    Examination-Participation Restrictions  Community Activity   Return to gym activities   Stability/Clinical Decision Making  Evolving/Moderate complexity    Rehab Potential  Good    PT Frequency  2x / week    PT Duration  4 weeks   plus eval; total POC = 5 weeks   PT Treatment/Interventions  ADLs/Self Care Home Management;Stair training;Functional mobility training;Therapeutic activities;Therapeutic exercise;Balance training;Neuromuscular re-education;Gait training;DME Instruction;Patient/family education;Manual techniques    PT Next Visit Plan  How is right shoulder? Review PWR! Moves given as HEP (quadruped)-as needed (may do these after standing balance exercises to stretch low back/R hip); standing corner balance, standing PWR! Moves to address squat technique, posture, flexibility. Stepping strategy (with multidirectional stepping),A/P weight shifting with arm swing/upright posture    Consulted and Agree  with Plan of Care  Patient       Patient will benefit from skilled therapeutic intervention in order to improve the following deficits and impairments:  Abnormal gait, Decreased balance, Decreased mobility, Decreased strength, Impaired flexibility, Postural dysfunction, Difficulty walking, Pain  Visit Diagnosis: Unsteadiness on feet  Abnormal posture  Muscle weakness (generalized)  Other abnormalities of gait and mobility     Problem List Patient Active Problem List   Diagnosis Date Noted  . Obstructive sleep apnea 05/31/2018  . Full thickness rotator cuff tear 03/16/2018  . Scoliosis deformity of spine 03/02/2018  . Acute low back pain 12/18/2017  . Degeneration of lumbosacral intervertebral disc 10/05/2017  . HTN (hypertension) 09/21/2017  . Bilateral leg edema 09/21/2017  . Osteomyelitis of toe of right foot ([place]) 08/05/2017  . Rheumatoid arthritis, adult ([place]) 08/05/2017  . Parkinson disease ([place]) 08/05/2017  . HLD (hyperlipidemia) 08/05/2017  . Paroxysmal atrial fibrillation ([place]) 08/05/2017  . Normocytic anemia 08/05/2017  . Persistent atrial fibrillation ([place]) 07/22/2017  . ED (erectile dysfunction) 04/20/2012  . GERD 07/01/2007  . SEIZURE DISORDER 07/01/2007  . Dyslipidemia 06/29/2007    Arliss Journey, PT, DPT  03/09/2019, 11:39 AM  [facility] [address] [place], [place], [address] Phone: [phone]   Fax:  [phone]  Name: Samuel Moran MRN: [id number] Date of Birth: 29-Jun-1945

## 2019-03-11 ENCOUNTER — Telehealth: Payer: Self-pay | Admitting: Cardiology

## 2019-03-11 DIAGNOSIS — Z01812 Encounter for preprocedural laboratory examination: Secondary | ICD-10-CM

## 2019-03-11 DIAGNOSIS — I4819 Other persistent atrial fibrillation: Secondary | ICD-10-CM

## 2019-03-11 NOTE — Telephone Encounter (Signed)
New Message:   Please call,pt would like to schedule his Ablation.

## 2019-03-11 NOTE — Telephone Encounter (Signed)
Scheduler made pt aware that I would follow up with him next week

## 2019-03-14 ENCOUNTER — Other Ambulatory Visit: Payer: Self-pay | Admitting: Family

## 2019-03-14 ENCOUNTER — Ambulatory Visit: Payer: Medicare HMO | Admitting: Physical Therapy

## 2019-03-14 DIAGNOSIS — I4819 Other persistent atrial fibrillation: Secondary | ICD-10-CM

## 2019-03-15 NOTE — Telephone Encounter (Signed)
Follow up   Patient would like a call to discuss the ablation per the previous message.

## 2019-03-16 ENCOUNTER — Ambulatory Visit: Payer: Medicare HMO | Admitting: Physical Therapy

## 2019-03-16 ENCOUNTER — Encounter: Payer: Self-pay | Admitting: Physical Therapy

## 2019-03-16 ENCOUNTER — Other Ambulatory Visit: Payer: Self-pay

## 2019-03-16 DIAGNOSIS — R293 Abnormal posture: Secondary | ICD-10-CM | POA: Diagnosis not present

## 2019-03-16 DIAGNOSIS — M6281 Muscle weakness (generalized): Secondary | ICD-10-CM

## 2019-03-16 DIAGNOSIS — R2681 Unsteadiness on feet: Secondary | ICD-10-CM | POA: Diagnosis not present

## 2019-03-16 DIAGNOSIS — R2689 Other abnormalities of gait and mobility: Secondary | ICD-10-CM

## 2019-03-16 NOTE — Telephone Encounter (Signed)
Pt would like to arrange AFib ablation 12/22. Aware I will be in touch in next several weeks to go over instructions. Aware office will contact him to arrange CT testing w/i week prior to procedure. Patient verbalized understanding and agreeable to plan.

## 2019-03-16 NOTE — Patient Instructions (Addendum)

## 2019-03-16 NOTE — Therapy (Signed)
[facility] [address] [place], [place], [address] Phone: [phone]   Fax:  [phone]  Physical Therapy Treatment  Patient Details  Name: Samuel Moran MRN: [id number] Date of Birth: November 20, 1945 Referring Provider (PT): Tat, Wells Guiles   Encounter Date: 03/16/2019  PT End of Session - 03/16/19 0942    Visit Number  6    Number of Visits  9    Date for PT Re-Evaluation  04/19/19    Authorization Type  [employer] Medicare-will need 10th visit progress note    PT Start Time  0845    PT Stop Time  0930    PT Time Calculation (min)  45 min    Equipment Utilized During Treatment  Gait belt    Activity Tolerance  Patient tolerated treatment well   exercises limited due to reports of R shoulder pain   Behavior During Therapy  [facility] for tasks assessed/performed       Past Medical History:  Diagnosis Date  . Atrial fibrillation ([place])    onset 07/17/17  . Dysrhythmia   . ED (erectile dysfunction)   . GERD 07/01/2007  . Parkinson's disease ([place])   . 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 PROSTATE SPECIFIC ANTIGEN, ELEVATED 06/29/2007  . Rheumatoid arthritis ([place])   . SEIZURE DISORDER 07/01/2007   one epsiode only  . Spinal stenosis of lumbar region 09/22/2014    Past Surgical History:  Procedure Laterality Date  . AMPUTATION     right great toe - tramatic age 60  . CARDIOVERSION N/A 03/15/2018   Procedure: CARDIOVERSION;  Surgeon: [name], MD;  Location: [facility] ENDOSCOPY;  Service: Cardiovascular;  Laterality: N/A;  . CARDIOVERSION N/A 12/13/2018   Procedure: CARDIOVERSION;  Surgeon: Sanda Klein, MD;  Location: [facility] ENDOSCOPY;  Service: Cardiovascular;  Laterality: N/A;  . CARDIOVERSION N/A 02/25/2019   Procedure: CARDIOVERSION;  Surgeon: [name], MD;  Location: [facility] ENDOSCOPY;  Service: Cardiovascular;  Laterality: N/A;  . COLONOSCOPY    . DEBRIDEMENT TOE Right 07/2016  . HAND SURGERY  03/2017   removal  cysts  . IR FLUORO GUIDE CV LINE RIGHT  10/16/2016  . IR US GUIDE VASC ACCESS RIGHT  10/16/2016  . OSTECTOMY Right 08/05/2017   Procedure: OSTECTOMY COMPLETE METARSAL HEAD FIRST RIGHT;  Surgeon: Trula Slade, DPM;  Location: [facility];  Service: Podiatry;  Laterality: Right;    There were no vitals filed for this visit.  Subjective Assessment - 03/16/19 0847    Subjective  Is in contact so schedule his ablation soon. Notices he is getting more SOB with walking. No falls. Has been doing his exercises at home. R shoulder is feeling better - has been using hemp cream on it.    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  No/denies    Pain Onset  More than a month ago         [facility] PT Assessment - 03/16/19 0854      Transfers   Five time sit to stand comments   14   no UE support, 9.56 seconds with BUE support      Dynamic Gait Index   Level Surface  Normal    Change in Gait Speed  Normal    Gait  with Horizontal Head Turns  Mild Impairment    Gait with Vertical Head Turns  Normal    Gait and Pivot Turn  Normal    Step Over Obstacle  Normal    Step Around Obstacles  Normal    Steps  Mild Impairment    Total Score  22    DGI comment:  22/24            Pt performs PWR! Moves in quadraped position x  10 reps   PWR! Up for improved posture - modified and performed push up at counter and then standing tall, 2 x 5 reps - pt reporting increased back discomfort attempting to perform on mat table.   PWR! Rock for improved weighshifting - cues for slower movement, holding stretch in position   PWR! Twist for improved trunk rotation - cues for slower movement and looking up at hand, holding stretch in position   PWR! Step for improved step initiation - attempted 2 x 5 reps in quadraped,  instead modified and performed at countertop with BUE support on counter, 1 x 10 reps         [facility] Adult PT Treatment/Exercise - 03/16/19 0854      Lumbar Exercises: Stretches   Other Lumbar Stretch Exercise  Staggered stance A/P weight shifting and holding stretch for hamstring/calf when shifting balance posteriorly, 1 x 10 reps B, single UE support at counter             PT Education - 03/16/19 0941    Education Details  fall prevention handout, reviewed quadraped PWR! moves - performing PWR! UP and PWR! Step at countertop.    Person(s) Educated  Patient    Methods  Explanation;Demonstration;Handout    Comprehension  Verbalized understanding;Returned demonstration          PT Long Term Goals - 03/16/19 0851      PT LONG TERM GOAL #1   Title  Pt will be independent with HEP to address Parkinson's deficits, pain, balance.    Baseline  performing 2/4 of his quadraped exercises, performing remainder of exercises.    Time  4    Period  Weeks    Status  Partially Met      PT LONG TERM GOAL #2   Title  Pt will perform 5x sit<>stand in less than or equal to 13 sec or improved transfer efficiency and strength.    Baseline  14 seconds with no UE support.    Time  4    Period  Weeks    Status  On-going      PT LONG TERM GOAL #3   Title  Pt will improve DGI score to at least 20/24 for decreased fall risk.    Baseline  22/24    Time  4    Period  Weeks    Status  Achieved      PT LONG TERM GOAL #4   Title  Pt will verbalize understanding of fall prevention in the home environment.    Baseline  reviewed today, provided pt with a handout.    Time  4    Period  Weeks    Status  Partially Met            Plan - 03/16/19 0951    Clinical Impression Statement  Focus of today's skilled session was assessing pt's LTGs - pt has acheived LTG #3 in regards to his DGI score. Today he scored a 22/24 (previously was 18/24),  indicating decreased fall risk. Pt has partially  met LTG #1 and #4 in regards to HEP and verbalizing understanding of fall prevention. Pt performed sit <> stand today with no UE support in 14 seconds - previously was the same time, will continue to address proper sit <> stand technique and LE strengthening. Pt reporting no R UE discomfort during any weight shifting/ PWR! moves exercises today.  Reported SOB after 10 reps of standing exercises, assessed O2 sats, WFL at 96%, cueing for pursed lip breathing. Pt still has 2 appointments next week due to initial scheduling conflicts, will work on finalizing HEP and discuss re-cert vs. D/C.    Personal Factors and Comorbidities  Comorbidity 3+    Comorbidities  See PMH-PD, A-fib, great toe amputation RLE    Examination-Activity Limitations  Sit;Stairs;Transfers;Locomotion Level    Examination-Participation Restrictions  Community Activity   Return to gym activities   Stability/Clinical Decision Making  Evolving/Moderate complexity    Rehab Potential  Good    PT Frequency  2x / week    PT Duration  4 weeks   plus eval; total POC = 5 weeks   PT Treatment/Interventions  ADLs/Self Care Home Management;Stair training;Functional mobility training;Therapeutic activities;Therapeutic exercise;Balance training;Neuromuscular re-education;Gait training;DME Instruction;Patient/family education;Manual techniques    PT Next Visit Plan  Discuss D/C vs. re-cert. Sit <> stands technique with forward weight shift, Start finalizing/Review PWR! Moves given as HEP, standing corner balance, standing PWR! Moves to address squat technique, posture, flexibility. Stepping strategy (with multidirectional stepping),A/P weight shifting with arm swing/upright posture    Consulted and Agree with Plan of Care  Patient       Patient will benefit from skilled therapeutic intervention in order to improve the following deficits and impairments:  Abnormal gait, Decreased balance, Decreased mobility, Decreased strength, Impaired flexibility,  Postural dysfunction, Difficulty walking, Pain  Visit Diagnosis: Unsteadiness on feet  Abnormal posture  Muscle weakness (generalized)  Other abnormalities of gait and mobility     Problem List Patient Active Problem List   Diagnosis Date Noted  . Obstructive sleep apnea 05/31/2018  . Full thickness rotator cuff tear 03/16/2018  . Scoliosis deformity of spine 03/02/2018  . Acute low back pain 12/18/2017  . Degeneration of lumbosacral intervertebral disc 10/05/2017  . HTN (hypertension) 09/21/2017  . Bilateral leg edema 09/21/2017  . Osteomyelitis of toe of right foot ([place]) 08/05/2017  . Rheumatoid arthritis, adult ([place]) 08/05/2017  . Parkinson disease ([place]) 08/05/2017  . HLD (hyperlipidemia) 08/05/2017  . Paroxysmal atrial fibrillation ([place]) 08/05/2017  . Normocytic anemia 08/05/2017  . Persistent atrial fibrillation ([place]) 07/22/2017  . ED (erectile dysfunction) 04/20/2012  . GERD 07/01/2007  . SEIZURE DISORDER 07/01/2007  . Dyslipidemia 06/29/2007    Lillia Pauls, DPT  03/16/2019, 10:02 AM  [facility] [address] [place], [place], [address] Phone: [phone]   Fax:  [phone]  Name: Samuel Moran MRN: [id number] Date of Birth: 1945/05/24

## 2019-03-18 ENCOUNTER — Other Ambulatory Visit: Payer: Self-pay | Admitting: Family

## 2019-03-18 DIAGNOSIS — I4819 Other persistent atrial fibrillation: Secondary | ICD-10-CM

## 2019-03-21 ENCOUNTER — Encounter: Payer: Self-pay | Admitting: Physical Therapy

## 2019-03-21 ENCOUNTER — Other Ambulatory Visit: Payer: Self-pay

## 2019-03-21 ENCOUNTER — Ambulatory Visit: Payer: Medicare HMO | Admitting: Physical Therapy

## 2019-03-21 ENCOUNTER — Telehealth: Payer: Self-pay | Admitting: Cardiology

## 2019-03-21 DIAGNOSIS — R2681 Unsteadiness on feet: Secondary | ICD-10-CM | POA: Diagnosis not present

## 2019-03-21 DIAGNOSIS — R293 Abnormal posture: Secondary | ICD-10-CM | POA: Diagnosis not present

## 2019-03-21 DIAGNOSIS — R2689 Other abnormalities of gait and mobility: Secondary | ICD-10-CM | POA: Diagnosis not present

## 2019-03-21 DIAGNOSIS — R0600 Dyspnea, unspecified: Secondary | ICD-10-CM

## 2019-03-21 DIAGNOSIS — M6281 Muscle weakness (generalized): Secondary | ICD-10-CM | POA: Diagnosis not present

## 2019-03-21 NOTE — Telephone Encounter (Signed)
Patient having sweats and sob while walking dog,. Chest pain, upper back, chills please advise

## 2019-03-21 NOTE — Telephone Encounter (Signed)
Lets schedu;e him to have echocardiogram to recheck LVEF

## 2019-03-21 NOTE — Telephone Encounter (Signed)
Called patient. He reports having shortness of breath while walking his dog, shortness of breath is worse on exertion. He denies swelling of lower extremities. He reports a sweating sensation that comes and goes out of no where. He will sweat on chest, back, and head. He denies fevers but states he has felt this way for 2 weeks. Denies chest pain. Will consult with Dr. Agustin Cree and follow up with patient.

## 2019-03-22 NOTE — Therapy (Signed)
[facility] [address] [place], [place], [address] Phone: [phone]   Fax:  [phone]  Physical Therapy Treatment  Patient Details  Name: Samuel Moran MRN: [id number] Date of Birth: August 26, 1945 Referring Provider (PT): Tat, Wells Guiles   Encounter Date: 03/21/2019  PT End of Session - 03/22/19 0837    Visit Number  7    Number of Visits  9    Date for PT Re-Evaluation  04/19/19    Authorization Type  [employer]-will need 10th visit progress note    PT Start Time  0848    PT Stop Time  0930    PT Time Calculation (min)  42 min    Equipment Utilized During Treatment  Gait belt    Activity Tolerance  Patient tolerated treatment well   multiple seated rest breaks due to SOB; O2 sats WNL; BP 126/68 end of session   Behavior During Therapy  [facility] for tasks assessed/performed       Past Medical History:  Diagnosis Date  . Atrial fibrillation ([place])    onset 07/17/17  . Dysrhythmia   . ED (erectile dysfunction)   . GERD 07/01/2007  . Parkinson's disease ([place])   . 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 PROSTATE SPECIFIC ANTIGEN, ELEVATED 06/29/2007  . Rheumatoid arthritis ([place])   . SEIZURE DISORDER 07/01/2007   one epsiode only  . Spinal stenosis of lumbar region 09/22/2014    Past Surgical History:  Procedure Laterality Date  . AMPUTATION     right great toe - tramatic age 40  . CARDIOVERSION N/A 03/15/2018   Procedure: CARDIOVERSION;  Surgeon: [name], MD;  Location: [facility] ENDOSCOPY;  Service: Cardiovascular;  Laterality: N/A;  . CARDIOVERSION N/A 12/13/2018   Procedure: CARDIOVERSION;  Surgeon: Sanda Klein, MD;  Location: [facility] ENDOSCOPY;  Service: Cardiovascular;  Laterality: N/A;  . CARDIOVERSION N/A 02/25/2019   Procedure: CARDIOVERSION;  Surgeon: [name], MD;  Location: [facility] ENDOSCOPY;  Service: Cardiovascular;  Laterality: N/A;  . COLONOSCOPY    . DEBRIDEMENT TOE Right 07/2016  . HAND  SURGERY  03/2017   removal cysts  . IR FLUORO GUIDE CV LINE RIGHT  10/16/2016  . IR US GUIDE VASC ACCESS RIGHT  10/16/2016  . OSTECTOMY Right 08/05/2017   Procedure: OSTECTOMY COMPLETE METARSAL HEAD FIRST RIGHT;  Surgeon: Trula Slade, DPM;  Location: [facility];  Service: Podiatry;  Laterality: Right;    There were no vitals filed for this visit.  Subjective Assessment - 03/21/19 0850    Subjective  Need to be more consistent with my stretching.  Have to call the cardiologist to see about the shortness of breath and sweating.    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  No/denies    Pain Onset  More than a month ago         [facility] PT Assessment - 03/21/19 0911      Functional Gait  Assessment   Gait assessed   Yes    Gait Level Surface  Walks 20 ft, slow speed, abnormal gait pattern, evidence for imbalance or deviates 10-15 in outside of the 12 in walkway width. Requires more than 7 sec to ambulate 20 ft.   8.65   Change in Gait Speed  Able to change speed, demonstrates mild gait deviations, deviates 6-10 in outside of the 12 in walkway width, or no gait deviations, unable to achieve a major change in velocity, or uses a change in velocity, or uses an assistive device.    Gait with Horizontal Head Turns  Performs head turns smoothly with slight change in gait velocity (eg, minor disruption to smooth gait path), deviates 6-10 in outside 12 in walkway width, or uses an assistive device.    Gait with Vertical Head Turns  Performs task with slight change in gait velocity (eg, minor disruption to smooth gait path), deviates 6 - 10 in outside 12 in walkway width or uses assistive device    Gait and Pivot Turn  Pivot turns safely within 3 sec and stops quickly with no loss of balance.    Step Over  Obstacle  Is able to step over 2 stacked shoe boxes taped together (9 in total height) without changing gait speed. No evidence of imbalance.    Gait with Narrow Base of Support  Ambulates 4-7 steps.    Gait with Eyes Closed  Walks 20 ft, uses assistive device, slower speed, mild gait deviations, deviates 6-10 in outside 12 in walkway width. Ambulates 20 ft in less than 9 sec but greater than 7 sec.    Ambulating Backwards  Walks 20 ft, uses assistive device, slower speed, mild gait deviations, deviates 6-10 in outside 12 in walkway width.    Steps  Alternating feet, must use rail.    Total Score  20    FGA comment:  Scores <22/30 indicate increased fall risk.      Reviewed HEP for PWR! Moves:  1-PWR! Moves in quadruped on mat surface (pt reports performing in bed at home)    PWR! Up for improved posture - at mat, push up to tall kneel, x 10 reps  PWR! Rock for improved weighshifting - cues for slower movement, holding stretch in position, then increased speed of rocking for increased flexibility, x 5 reps  PWR! Twist for improved trunk rotation - cues for slower movement and looking up at hand, holding stretch in position x 5 reps each side  PWR! Step for improved step initiation - attempted 5 reps in quadraped, limitations in quadruped position due to hip stiffness  Pt requires brief rest breaks between exercises due to SOB.   2-PWR! Moves in supine: PWR! Up through shoulders x 10 reps, PWR! Up through hips x 10 reps  PWR! Rock x 5 reps each side, cues for technique for optimal stretch through hips  PWR! Twist x 5 reps each side, cues for technique for optimal stretch through hips  PWR! Step, single leg out and in, x 5 reps each.           [facility] PT Treatment/Exercise - 03/21/19 0911      Transfers   Transfers  Sit to Stand;Stand to Sit    Sit to Stand  6: Modified independent (Device/Increase time);Without upper extremity assist;From chair/3-in-1    Five time  sit to stand comments   13.81     Stand to Sit  6: Modified independent (Device/Increase time);Without upper extremity assist;To chair/3-in-1           Self Care:  Discussed also breathing limitations, needing to take frequent rest breaks due to SOB.  Discussed that pt is to call cardiologist after PT session.  PT Education - 03/22/19 0817    Education Details  Discussed POC, plans  to continue PT, fall risk per FGA.    Person(s) Educated  Patient    Methods  Explanation    Comprehension  Verbalized understanding          PT Long Term Goals - 03/22/19 0814      PT LONG TERM GOAL #1   Title  Pt will be independent with HEP to address Parkinson's deficits, pain, balance.    Baseline  performing 2/4 of his quadraped exercises, performing remainder of exercises.    Time  4    Period  Weeks    Status  Partially Met      PT LONG TERM GOAL #2   Title  Pt will perform 5x sit<>stand in less than or equal to 13 sec or improved transfer efficiency and strength.    Baseline  14 seconds with no UE support.    Time  4    Period  Weeks    Status  On-going      PT LONG TERM GOAL #3   Title  Pt will improve DGI score to at least 20/24 for decreased fall risk.    Baseline  22/24    Time  4    Period  Weeks    Status  Achieved      PT LONG TERM GOAL #4   Title  Pt will verbalize understanding of fall prevention in the home environment.    Baseline  reviewed today, provided pt with a handout.    Time  4    Period  Weeks    Status  Partially Met            Plan - 03/22/19 1761    Clinical Impression Statement  Focused on review of supine and quadruped PWR! Moves as part of HEP; assessed further gait measures, with FGA score 20/30, indicating fall risk.  Pt is motivated for continued therapy; however, he is experiencing SOB and fatigue throughout session, and appears to be going for work-up for cardiologist.  He would benefit (and pt agreed to) from continued skilled PT to  address balance and transfers, as well as functional mobility; however, need to discuss potential for putting pt on hold until he sees cardiologist.    Personal Factors and Comorbidities  Comorbidity 3+    Comorbidities  See PMH-PD, A-fib, great toe amputation RLE    Examination-Activity Limitations  Sit;Stairs;Transfers;Locomotion Level    Examination-Participation Restrictions  Community Activity   Return to gym activities   Stability/Clinical Decision Making  Evolving/Moderate complexity    Rehab Potential  Good    PT Frequency  2x / week    PT Duration  4 weeks   plus eval; total POC = 5 weeks   PT Treatment/Interventions  ADLs/Self Care Home Management;Stair training;Functional mobility training;Therapeutic activities;Therapeutic exercise;Balance training;Neuromuscular re-education;Gait training;DME Instruction;Patient/family education;Manual techniques    PT Next Visit Plan  We discussed possible recert, for 1x/wk for 4 additional weeks after this week, but pt did not schedule?  Look at adding standing PWR! Moves (PWR! Up and PWR! Rock for [employer]) to HEP; need to discuss with pt possibly holding further scehduling/recert until he sees cardiologist, given SOB, sweating, fatigue symptoms    Consulted and Agree with Plan of Care  Patient       Patient will benefit from skilled therapeutic intervention in order to improve the following deficits and impairments:  Abnormal gait, Decreased balance, Decreased mobility, Decreased strength, Impaired flexibility, Postural dysfunction, Difficulty walking, Pain  Visit Diagnosis: Unsteadiness on  feet  Abnormal posture  Other abnormalities of gait and mobility     Problem List Patient Active Problem List   Diagnosis Date Noted  . Obstructive sleep apnea 05/31/2018  . Full thickness rotator cuff tear 03/16/2018  . Scoliosis deformity of spine 03/02/2018  . Acute low back pain 12/18/2017  . Degeneration of lumbosacral  intervertebral disc 10/05/2017  . HTN (hypertension) 09/21/2017  . Bilateral leg edema 09/21/2017  . Osteomyelitis of toe of right foot ([place]) 08/05/2017  . Rheumatoid arthritis, adult ([place]) 08/05/2017  . Parkinson disease ([place]) 08/05/2017  . HLD (hyperlipidemia) 08/05/2017  . Paroxysmal atrial fibrillation ([place]) 08/05/2017  . Normocytic anemia 08/05/2017  . Persistent atrial fibrillation ([place]) 07/22/2017  . ED (erectile dysfunction) 04/20/2012  . GERD 07/01/2007  . SEIZURE DISORDER 07/01/2007  . Dyslipidemia 06/29/2007    Altan Kraai W. 03/22/2019, 12:19 PM  Frazier Butt., PT   [facility] [address] [place], [place], [address] Phone: [phone]   Fax:  [phone]  Name: Samuel Moran MRN: [id number] Date of Birth: Jun 22, 1945

## 2019-03-22 NOTE — Telephone Encounter (Signed)
Left message for pt that he has echo scheduled in [facility] at [facility] on Thursday, 11/19 at 10:15. If unable to go then call [phone] to reschedule.

## 2019-03-22 NOTE — Telephone Encounter (Signed)
Informed patient Dr. Agustin Cree wants him to have a echocardiogram tomorrow. Will schedule and call patient back.

## 2019-03-23 ENCOUNTER — Ambulatory Visit: Payer: Medicare HMO | Admitting: Physical Therapy

## 2019-03-23 NOTE — Telephone Encounter (Signed)
Patient informed of echo appointment.  

## 2019-03-24 ENCOUNTER — Other Ambulatory Visit: Payer: Self-pay

## 2019-03-24 ENCOUNTER — Ambulatory Visit (HOSPITAL_BASED_OUTPATIENT_CLINIC_OR_DEPARTMENT_OTHER)
Admission: RE | Admit: 2019-03-24 | Discharge: 2019-03-24 | Disposition: A | Discharge status: Home / Self Care | Payer: Medicare HMO | Source: Ambulatory Visit | Attending: Cardiology | Admitting: Cardiology

## 2019-03-24 DIAGNOSIS — R0600 Dyspnea, unspecified: Secondary | ICD-10-CM | POA: Insufficient documentation

## 2019-03-24 NOTE — Progress Notes (Signed)
  Echocardiogram 2D Echocardiogram has been performed.  Samuel Moran 03/24/2019, 10:42 AM

## 2019-03-24 NOTE — Telephone Encounter (Signed)
Left message asking pt to call me back to let me know if 12/22 ablation date is still the date he wants, per our last conversation (he was going to confirm w/ family).

## 2019-03-29 ENCOUNTER — Other Ambulatory Visit: Payer: Self-pay | Admitting: Emergency Medicine

## 2019-03-29 ENCOUNTER — Telehealth: Payer: Self-pay | Admitting: Emergency Medicine

## 2019-03-29 MED ORDER — FLECAINIDE ACETATE 100 MG PO TABS: 100.0000 mg | ORAL_TABLET | Freq: Two times a day (BID) | ORAL | 1 refills | Status: AC

## 2019-03-29 NOTE — Telephone Encounter (Signed)
Left message for patient to return call regarding results  

## 2019-03-29 NOTE — Telephone Encounter (Signed)
Patient called back and informed of results. He is still wanting to know about the sweating he has. Discussed with Dr. Agustin Cree for now patient advised to monitor this and let us know if it gets worse. Patient verbally understood. No further questions.

## 2019-04-01 ENCOUNTER — Telehealth: Payer: Self-pay | Admitting: *Deleted

## 2019-04-01 NOTE — Telephone Encounter (Signed)
Copied from [place] [phone]. Topic: Appointment Scheduling - Transfer of Care >> Apr 01, 2019  8:43 AM Richardo Priest, NT wrote: Pt is requesting to transfer FROM: Dr.Krasowski/ [place] Pt is requesting to transfer TO: Dr. Daylene Katayama Reason for requested transfer: pt's PCP retired  Send CRM to patient's current PCP (transferring FROM).

## 2019-04-04 ENCOUNTER — Encounter (HOSPITAL_COMMUNITY): Payer: Self-pay | Admitting: Physician Assistant

## 2019-04-04 ENCOUNTER — Ambulatory Visit (HOSPITAL_COMMUNITY)
Admission: RE | Admit: 2019-04-04 | Discharge: 2019-04-04 | Disposition: A | Discharge status: Home / Self Care | Payer: Medicare HMO | Source: Ambulatory Visit | Attending: Physician Assistant | Admitting: Physician Assistant

## 2019-04-04 ENCOUNTER — Other Ambulatory Visit: Payer: Self-pay

## 2019-04-04 VITALS — BP 120/58 | HR 74 | Ht 71.0 in | Wt 217.2 lb

## 2019-04-04 DIAGNOSIS — E669 Obesity, unspecified: Secondary | ICD-10-CM | POA: Insufficient documentation

## 2019-04-04 DIAGNOSIS — I4819 Other persistent atrial fibrillation: Secondary | ICD-10-CM

## 2019-04-04 DIAGNOSIS — Z79899 Other long term (current) drug therapy: Secondary | ICD-10-CM | POA: Diagnosis not present

## 2019-04-04 DIAGNOSIS — I10 Essential (primary) hypertension: Secondary | ICD-10-CM | POA: Insufficient documentation

## 2019-04-04 DIAGNOSIS — Z6830 Body mass index (BMI) 30.0-30.9, adult: Secondary | ICD-10-CM | POA: Diagnosis not present

## 2019-04-04 DIAGNOSIS — F17210 Nicotine dependence, cigarettes, uncomplicated: Secondary | ICD-10-CM | POA: Insufficient documentation

## 2019-04-04 DIAGNOSIS — D6869 Other thrombophilia: Secondary | ICD-10-CM | POA: Diagnosis not present

## 2019-04-04 DIAGNOSIS — G20 Parkinson's disease: Secondary | ICD-10-CM | POA: Diagnosis not present

## 2019-04-04 DIAGNOSIS — Z7901 Long term (current) use of anticoagulants: Secondary | ICD-10-CM | POA: Diagnosis not present

## 2019-04-04 DIAGNOSIS — G4733 Obstructive sleep apnea (adult) (pediatric): Secondary | ICD-10-CM | POA: Insufficient documentation

## 2019-04-04 DIAGNOSIS — R69 Illness, unspecified: Secondary | ICD-10-CM | POA: Diagnosis not present

## 2019-04-04 NOTE — Progress Notes (Signed)
Primary Care Physician: Park Liter, MD Primary Cardiologist: Dr Agustin Cree  Primary Electrophysiologist: Dr Curt Bears Referring Physician: Dr Aletha Halim Samuel Moran is a 73 y.o. male with a history of persistent atrial fibrillation, OSA, HTN, Parkinson's diease, seizures, and RA who presents for follow up in the [facility].  The patient was initially diagnosed with atrial fibrillation 07/2017. He was started on Eliquis for a CHADS2VASC score of 2 and flecainide and underwent DCCV x2 but unfortunately has had recurrence of his afib. He has symptoms of SOB and fatigue when in afib. He does admit to alcohol use. He is tolerating the higher dose of flecainide. He is scheduled for an afib ablation on 04/26/19.  Today, he denies symptoms of palpitations, chest pain, orthopnea, PND, lower extremity edema, dizziness, presyncope, syncope, snoring, daytime somnolence, bleeding, or neurologic sequela. The patient is tolerating medications without difficulties and is otherwise without complaint today.    Atrial Fibrillation Risk Factors:  he does have symptoms or diagnosis of sleep apnea. he is compliant with CPAP therapy. he does not have a history of rheumatic fever. he does have a history of alcohol use.  he has a BMI of Body mass index is 30.29 kg/m.Marland Kitchen Filed Weights   04/04/19 1004  Weight: 98.5 kg    Family History  Problem Relation Age of Onset  . Arthritis Mother   . Alcoholism Mother   . Arthritis Father        died after fall/trauma and had TBI  . Colon cancer Neg Hx   . Esophageal cancer Neg Hx   . Rectal cancer Neg Hx   . Stomach cancer Neg Hx      Atrial Fibrillation Management history:  Previous antiarrhythmic drugs: flecainide Previous cardioversions: 03/2018, 12/2018, 02/25/19 Previous ablations: none CHADS2VASC score: 2 Anticoagulation history: Eliquis    Past Medical History:  Diagnosis Date  . Atrial fibrillation ([place])     onset 07/17/17  . Dysrhythmia   . ED (erectile dysfunction)   . GERD 07/01/2007  . Parkinson's disease ([place])   . 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 PROSTATE SPECIFIC ANTIGEN, ELEVATED 06/29/2007  . Rheumatoid arthritis ([place])   . SEIZURE DISORDER 07/01/2007   one epsiode only  . Spinal stenosis of lumbar region 09/22/2014   Past Surgical History:  Procedure Laterality Date  . AMPUTATION     right great toe - tramatic age 63  . CARDIOVERSION N/A 03/15/2018   Procedure: CARDIOVERSION;  Surgeon: [name], MD;  Location: [facility] ENDOSCOPY;  Service: Cardiovascular;  Laterality: N/A;  . CARDIOVERSION N/A 12/13/2018   Procedure: CARDIOVERSION;  Surgeon: Sanda Klein, MD;  Location: [facility] ENDOSCOPY;  Service: Cardiovascular;  Laterality: N/A;  . CARDIOVERSION N/A 02/25/2019   Procedure: CARDIOVERSION;  Surgeon: [name], MD;  Location: [facility] ENDOSCOPY;  Service: Cardiovascular;  Laterality: N/A;  . COLONOSCOPY    . DEBRIDEMENT TOE Right 07/2016  . HAND SURGERY  03/2017   removal cysts  . IR FLUORO GUIDE CV LINE RIGHT  10/16/2016  . IR US GUIDE VASC ACCESS RIGHT  10/16/2016  . OSTECTOMY Right 08/05/2017   Procedure: OSTECTOMY COMPLETE METARSAL HEAD FIRST RIGHT;  Surgeon: Trula Slade, DPM;  Location: [facility];  Service: Podiatry;  Laterality: Right;    Current Outpatient Medications  Medication Sig Dispense Refill  . B Complex-C (SUPER B COMPLEX PO) Take 1 tablet by mouth every evening.    . carbidopa-levodopa (SINEMET IR) 25-100 MG tablet Take 1  tablet by mouth 4 (four) times daily. Take at 7am/11am/3pm/7pm 360 tablet 1  . carvedilol (COREG) 12.5 MG tablet Take 1 tablet (12.5 mg total) by mouth 2 (two) times daily. 60 tablet 3  . ELIQUIS 5 MG TABS tablet TAKE 1 TABLET BY MOUTH TWICE A DAY 60 tablet 6  . flecainide (TAMBOCOR) 100 MG tablet Take 1 tablet (100 mg total) by mouth 2 (two) times daily. 180 tablet 1  . furosemide (LASIX) 20 MG tablet Take 1 tablet (20 mg total) by mouth  daily. 5 tablet 0  . gabapentin (NEURONTIN) 100 MG capsule Take 100 mg by mouth 3 (three) times daily.    Marland Kitchen ibuprofen (ADVIL,MOTRIN) 200 MG tablet Take 800 mg by mouth 2 (two) times daily as needed (for pain/headaches.).     Marland Kitchen leflunomide (ARAVA) 20 MG tablet Take 20 mg by mouth daily.    . metoprolol succinate (TOPROL-XL) 25 MG 24 hr tablet Take 25 mg by mouth daily.    . Multiple Vitamin (MULTIVITAMIN WITH MINERALS) TABS tablet Take 1 tablet by mouth daily. Centrum Silver    . NEUPRO 4 MG/24HR APPLY 1 PATCH ONTO THE SKIN EVERY DAY 30 patch 5  . Omega-3 Fatty Acids (FISH OIL) 1200 MG CPDR Take 1,200 mg by mouth daily.     No current facility-administered medications for this encounter.     No Known Allergies  Social History   Socioeconomic History  . Marital status: Single    Spouse name: Not on file  . Number of children: 0  . Years of education: Not on file  . Highest education level: High school graduate  Occupational History  . Occupation: Chartered certified accountant  Social Needs  . Financial resource strain: Not on file  . Food insecurity    Worry: Not on file    Inability: Not on file  . Transportation needs    Medical: Not on file    Non-medical: Not on file  Tobacco Use  . Smoking status: Light Tobacco Smoker    Types: Cigars  . Smokeless tobacco: Never Used  . Tobacco comment: 1 cigar, takes 3 weeks  Substance and Sexual Activity  . Alcohol use: Yes    Alcohol/week: 4.0 standard drinks    Types: 4 Standard drinks or equivalent per week    Comment: one drink a day (liquor)  . Drug use: No  . Sexual activity: Not on file  Lifestyle  . Physical activity    Days per week: Not on file    Minutes per session: Not on file  . Stress: Not on file  Relationships  . Social Herbalist on phone: Not on file    Gets together: Not on file    Attends religious service: Not on file    Active member of club or organization: Not on file    Attends meetings of clubs or  organizations: Not on file    Relationship status: Not on file  . Intimate partner violence    Fear of current or ex partner: Not on file    Emotionally abused: Not on file    Physically abused: Not on file    Forced sexual activity: Not on file  Other Topics Concern  . Not on file  Social History Narrative  . Not on file     ROS- All systems are reviewed and negative except as per the HPI above.  Physical Exam: Vitals:   04/04/19 1004  BP: (!) 120/58  Pulse:  74  Weight: 98.5 kg  Height: 5\' 11"  (1.803 m)    GEN- The patient is well appearing obese male, alert and oriented x 3 today.   Head- normocephalic, atraumatic Eyes-  Sclera clear, conjunctiva pink Ears- hearing intact Oropharynx- clear Neck- supple  Lungs- Clear to ausculation bilaterally, normal work of breathing Heart- irregular rate and rhythm, no murmurs, rubs or gallops  GI- soft, NT, ND, + BS Extremities- no clubbing, cyanosis, or edema MS- no significant deformity or atrophy Skin- no rash or lesion Psych- euthymic mood, full affect Neuro- strength and sensation are intact, tremor right hand  Wt Readings from Last 3 Encounters:  04/04/19 98.5 kg  03/08/19 99.8 kg  03/01/19 98.9 kg    EKG today demonstrates afib HR 74, QRS 98, QTc 470  Echo 03/24/19 demonstrated   1. Left ventricular ejection fraction, by visual estimation, is 55 to 60%. The left ventricle has normal function. There is no left ventricular hypertrophy. Pseudonormal pattern.  2. The mitral valve is normal in structure. Mild to moderate mitral valve regurgitation. No evidence of mitral stenosis.  3. Mild LA enlargement.  4. Mildly dilated IVC.  Epic records are reviewed at length today  Assessment and Plan:  1. Persistent atrial fibrillation S/p DCCV 02/25/19. Patient has seen Dr Curt Bears and is scheduled for an ablation next month. Continue Eliquis 5 mg BID Continue flecainide 100 mg BID for now. Continue Coreg 12.5 mg BID   This patients CHA2DS2-VASc Score and unadjusted Ischemic Stroke Rate (% per year) is equal to 2.2 % stroke rate/year from a score of 2  Above score calculated as 1 point each if present [CHF, HTN, DM, Vascular=MI/PAD/Aortic Plaque, Age if 65-74, or Male] Above score calculated as 2 points each if present [Age > 75, or Stroke/TIA/TE]   2. Obesity Body mass index is 30.29 kg/m. Lifestyle modification was discussed at length including regular exercise and weight reduction.  3. Obstructive sleep apnea The importance of adequate treatment of sleep apnea was discussed today in order to improve our ability to maintain sinus rhythm long term. Patient compliant with CPAP therapy.  4. HTN Stable, no changes today.   Follow up after afib ablation.    [facility] [address] [place], [place] [address] [phone] 04/04/2019 10:34 AM

## 2019-04-04 NOTE — Telephone Encounter (Signed)
ok 

## 2019-04-04 NOTE — Telephone Encounter (Signed)
Please schedule patient a TOC appointment

## 2019-04-06 NOTE — Telephone Encounter (Signed)
Reviewed upcoming ablation instructions with pt. He will pick up the typed instruction letter tomorrow when he stops by the office tomorrow for pre procedure blood work. Aware office will call him to arrange post procedure follow up. Patient verbalized understanding and agreeable to plan.

## 2019-04-07 ENCOUNTER — Ambulatory Visit: Payer: Medicare HMO | Admitting: Physical Therapy

## 2019-04-07 ENCOUNTER — Other Ambulatory Visit: Payer: Self-pay

## 2019-04-07 ENCOUNTER — Other Ambulatory Visit: Payer: Medicare HMO | Admitting: *Deleted

## 2019-04-07 ENCOUNTER — Encounter: Payer: Self-pay | Admitting: *Deleted

## 2019-04-07 DIAGNOSIS — I4819 Other persistent atrial fibrillation: Secondary | ICD-10-CM

## 2019-04-07 DIAGNOSIS — Z01812 Encounter for preprocedural laboratory examination: Secondary | ICD-10-CM

## 2019-04-07 LAB — BASIC METABOLIC PANEL
BUN/Creatinine Ratio: 27 — ABNORMAL HIGH (ref 10–24)
BUN: 27 mg/dL (ref 8–27)
CO2: 20 mmol/L (ref 20–29)
Calcium: 8.6 mg/dL (ref 8.6–10.2)
Chloride: 105 mmol/L (ref 96–106)
Creatinine, Ser: 1.01 mg/dL (ref 0.76–1.27)
GFR calc Af Amer: 86 mL/min/{1.73_m2} (ref >59)
GFR calc non Af Amer: 74 mL/min/{1.73_m2} (ref >59)
Glucose: 103 mg/dL — ABNORMAL HIGH (ref 65–99)
Potassium: 4.1 mmol/L (ref 3.5–5.2)
Sodium: 137 mmol/L (ref 134–144)

## 2019-04-07 LAB — CBC
Hematocrit: 37.5 % (ref 37.5–51.0)
Hemoglobin: 12.3 g/dL — ABNORMAL LOW (ref 13.0–17.7)
MCH: 30.6 pg (ref 26.6–33.0)
MCHC: 32.8 g/dL (ref 31.5–35.7)
MCV: 93 fL (ref 79–97)
Platelets: 178 10*3/uL (ref 150–450)
RBC: 4.02 x10E6/uL — ABNORMAL LOW (ref 4.14–5.80)
RDW: 13.1 % (ref 11.6–15.4)
WBC: 9.5 10*3/uL (ref 3.4–10.8)

## 2019-04-15 ENCOUNTER — Ambulatory Visit: Payer: Medicare HMO | Attending: Neurology | Admitting: Physical Therapy

## 2019-04-15 ENCOUNTER — Other Ambulatory Visit: Payer: Self-pay

## 2019-04-15 DIAGNOSIS — M6281 Muscle weakness (generalized): Secondary | ICD-10-CM | POA: Diagnosis not present

## 2019-04-15 DIAGNOSIS — R2681 Unsteadiness on feet: Secondary | ICD-10-CM

## 2019-04-15 DIAGNOSIS — R2689 Other abnormalities of gait and mobility: Secondary | ICD-10-CM

## 2019-04-15 DIAGNOSIS — R293 Abnormal posture: Secondary | ICD-10-CM | POA: Diagnosis not present

## 2019-04-15 NOTE — Therapy (Signed)
[facility] [address] [place], [place], [address] Phone: [phone]   Fax:  [phone]  Physical Therapy Treatment/ Re-Cert  Patient Details  Name: Samuel Moran MRN: [id number] Date of Birth: [birth year] Referring Provider (PT): Tat, Wells Guiles   Encounter Date: 04/15/2019  PT End of Session - 04/15/19 1201    Visit Number  8    Number of Visits  12    Date for PT Re-Evaluation  07/14/19   due to scheduling, pt undergoing ablation at end of December   Authorization Type  [place] need 10th visit progress note    PT Start Time  0717    PT Stop Time  0800    PT Time Calculation (min)  43 min    Equipment Utilized During Treatment  Gait belt    Activity Tolerance  Patient tolerated treatment well   multiple seated rest breaks due to SOB; O2 sats WNL   Behavior During Therapy  [facility] for tasks assessed/performed       Past Medical History:  Diagnosis Date  . Atrial fibrillation ([place])    onset 07/17/17  . Dysrhythmia   . ED (erectile dysfunction)   . GERD 07/01/2007  . Parkinson's disease ([place])   . 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 PROSTATE SPECIFIC ANTIGEN, ELEVATED 06/29/2007  . Rheumatoid arthritis ([place])   . SEIZURE DISORDER 07/01/2007   one epsiode only  . Spinal stenosis of lumbar region 09/22/2014    Past Surgical History:  Procedure Laterality Date  . AMPUTATION     right great toe - tramatic age 89  . CARDIOVERSION N/A 03/15/2018   Procedure: CARDIOVERSION;  Surgeon: [name], MD;  Location: [facility] ENDOSCOPY;  Service: Cardiovascular;  Laterality: N/A;  . CARDIOVERSION N/A 12/13/2018   Procedure: CARDIOVERSION;  Surgeon: Sanda Klein, MD;  Location: [facility] ENDOSCOPY;  Service: Cardiovascular;  Laterality: N/A;  . CARDIOVERSION N/A 02/25/2019   Procedure: CARDIOVERSION;  Surgeon: [name], MD;  Location: [facility] ENDOSCOPY;  Service: Cardiovascular;  Laterality: N/A;  . COLONOSCOPY     . DEBRIDEMENT TOE Right 07/2016  . HAND SURGERY  03/2017   removal cysts  . IR FLUORO GUIDE CV LINE RIGHT  10/16/2016  . IR US GUIDE VASC ACCESS RIGHT  10/16/2016  . OSTECTOMY Right 08/05/2017   Procedure: OSTECTOMY COMPLETE METARSAL HEAD FIRST RIGHT;  Surgeon: Trula Slade, DPM;  Location: [facility];  Service: Podiatry;  Laterality: Right;    There were no vitals filed for this visit.  Subjective Assessment - 04/15/19 0720    Subjective  Saw the cardiologist - will be getting an atrial fibriallation ablation done on the 12/22. Noticing that he has been getting more short of breath. Exercises are going well, not doing them as much as he should. Hip is getting better, but it is still there - laying on that hip makes it better.    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  No/denies    Pain Onset  More than a month ago             Pt performs PWR! Moves in standing position at edge of mat:   PWR! Up for improved posture 1 x 10 reps   PWR! Rock for  improved weighshifting 2 x 5 reps, cues to look up at hand   PWR! Twist for improved trunk rotation 1 x 10 reps, after performing these reports that shoulder is starting to tighten up, needs to brace BLE against back of mat for balance.   PWR! Step for improved step initiation 2 x 5 reps without reaching UE due to reports of shoulder pain.   Cues provided for holding upright posture, weight shifting, and technique.   Needs frequent seated and standing rest breaks due to SOB - O2 and HR WNL throughout session              [facility] Adult PT Treatment/Exercise - 04/15/19 0001      Self-Care   Self-Care  Other Self-Care Comments    Other Self-Care Comments   Pt requesting that therapist check out his R shoulder due to increased  tightness during standing PWR! exercises. Therapist stating that the referral is for PD and pt has not yet gone to his PCP regarding his R shoulder. Advised pt to go see PCP to get a referral for R shoulder pain (fot OT) if appropriate in order to treat.  Instructed pt that he will need to get medical clearance from his physician after his ablation in order to safely resume therapy.           Balance Exercises - 04/15/19 0758      Balance Exercises: Standing   Standing Eyes Opened  Wide (BOA);Head turns;Foam/compliant surface;Other (comment)   2 x 10 reps head nods and turns   Other Standing Exercises  narrower BOS eyes open on foam 2 x 30 seconds        PT Education - 04/15/19 1201    Education Details  Discussed POC, plans to continue PT, will need medical clearance  from MD after ablation, pt making an appointment with PCP in regards to continued R shoulder discomfort    Person(s) Educated  Patient    Methods  Explanation    Comprehension  Verbalized understanding          PT Long Term Goals - 03/22/19 3419      PT LONG TERM GOAL #1   Title  Pt will be independent with HEP to address Parkinson's deficits, pain, balance.    Baseline  performing 2/4 of his quadraped exercises, performing remainder of exercises.    Time  4    Period  Weeks    Status  Partially Met      PT LONG TERM GOAL #2   Title  Pt will perform 5x sit<>stand in less than or equal to 13 sec or improved transfer efficiency and strength.    Baseline  14 seconds with no UE support.    Time  4    Period  Weeks    Status  On-going      PT LONG TERM GOAL #3   Title  Pt will improve DGI score to at least 20/24 for decreased fall risk.    Baseline  22/24    Time  4    Period  Weeks    Status  Achieved      PT LONG TERM GOAL #4   Title  Pt will verbalize understanding of fall prevention in the home environment.    Baseline  reviewed today, provided pt with a handout.    Time  4    Period  Weeks     Status  Partially Met  Revised/ongoing LTGs for re-cert:    PT Long Term Goals - 04/15/19 1344      PT LONG TERM GOAL #1   Title  Pt will be independent with HEP to address Parkinson's deficits, pain, balance. ALL LTGS DUE 06/06/19    Baseline  performing 2/4 of his quadraped exercises, performing remainder of exercises.    Time  8   delay due to pt's abalation and with scheduling   Period  Weeks    Status  On-going      PT LONG TERM GOAL #2   Title  Pt will perform 5x sit<>stand in less than or equal to 13 sec or improved transfer efficiency and strength.    Baseline  14 seconds with no UE support.    Time  8    Period  Weeks    Status  On-going      PT LONG TERM GOAL #3   Title  Patient will improve FGA score to at least a 23/30 in order to demo decr fall risk.    Baseline  20/30    Time  8    Period  Weeks    Status  Achieved      PT LONG TERM GOAL #4   Title  Pt will verbalize understanding of fall prevention in the home environment.    Baseline  previously reviewed - handout provided.    Time  8    Period  Weeks    Status  On-going          04/15/19 1343  Plan  Clinical Impression Statement Focus of today's session was standing PWR! Moves to address weight shifting, trunk flexibility, and posture, and standing corner balance. Pt continues to have SOB during session - O2 and HR WFL throughout session today. Pt needed frequent seated rest breaks throughout session today. Pt will be undergoing an ablation for a fib at the end of December. Pt continues to have R shoulder pain that limits pt's ability to perform standing PWR! Moves, instructed pt to follow up regarding shoulder pain with PCP. Pt is motivated for continued therapy, will re-cert for an additional 1x week for 4 weeks in January after receiving medical clearance from physician after ablation in order to progress towards unmet goals.  Personal Factors and Comorbidities Comorbidity 3+  Comorbidities See  PMH-PD, A-fib, great toe amputation RLE  Examination-Activity Limitations Sit;Stairs;Transfers;Locomotion Level  Examination-Participation Restrictions Community Activity (Return to gym activities)  Pt will benefit from skilled therapeutic intervention in order to improve on the following deficits Abnormal gait;Decreased balance;Decreased mobility;Decreased strength;Impaired flexibility;Postural dysfunction;Difficulty walking;Pain  Stability/Clinical Decision Making Evolving/Moderate complexity  Rehab Potential Good  PT Frequency 1x / week  PT Duration 4 weeks (plus eval; total POC = 5 weeks)  PT Treatment/Interventions ADLs/Self Care Home Management;Stair training;Functional mobility training;Therapeutic activities;Therapeutic exercise;Balance training;Neuromuscular re-education;Gait training;DME Instruction;Patient/family education;Manual techniques  PT Next Visit Plan how is R  shoulder? Look at adding standing PWR! Moves (PWR! Up and PWR! Rock for [employer]) to HEP; standing corner balance, stepping strategies.  Consulted and Agree with Plan of Care Patient       Patient will benefit from skilled therapeutic intervention in order to improve the following deficits and impairments:     Visit Diagnosis: Abnormal posture  Other abnormalities of gait and mobility  Unsteadiness on feet  Muscle weakness (generalized)     Problem List Patient Active Problem List   Diagnosis Date Noted  . Acquired thrombophilia ([place]) 04/04/2019  . Obstructive sleep  apnea 05/31/2018  . Full thickness rotator cuff tear 03/16/2018  . Scoliosis deformity of spine 03/02/2018  . Acute low back pain 12/18/2017  . Degeneration of lumbosacral intervertebral disc 10/05/2017  . HTN (hypertension) 09/21/2017  . Bilateral leg edema 09/21/2017  . Osteomyelitis of toe of right foot ([place]) 08/05/2017  . Rheumatoid arthritis, adult ([place]) 08/05/2017  . Parkinson disease ([place]) 08/05/2017  . HLD  (hyperlipidemia) 08/05/2017  . Paroxysmal atrial fibrillation ([place]) 08/05/2017  . Normocytic anemia 08/05/2017  . Persistent atrial fibrillation ([place]) 07/22/2017  . ED (erectile dysfunction) 04/20/2012  . GERD 07/01/2007  . SEIZURE DISORDER 07/01/2007  . Dyslipidemia 06/29/2007    Arliss Journey, PT, DPT  04/15/2019, 1:41 PM  [facility] [address] [place], [place], [address] Phone: [phone]   Fax:  [phone]  Name: Samuel Moran MRN: [id number] Date of Birth: 1945-11-17

## 2019-04-18 ENCOUNTER — Telehealth (HOSPITAL_COMMUNITY): Payer: Self-pay | Admitting: Emergency Medicine

## 2019-04-18 DIAGNOSIS — M0579 Rheumatoid arthritis with rheumatoid factor of multiple sites without organ or systems involvement: Secondary | ICD-10-CM | POA: Diagnosis not present

## 2019-04-18 DIAGNOSIS — R509 Fever, unspecified: Secondary | ICD-10-CM | POA: Diagnosis not present

## 2019-04-18 DIAGNOSIS — M2550 Pain in unspecified joint: Secondary | ICD-10-CM | POA: Diagnosis not present

## 2019-04-18 DIAGNOSIS — Z79899 Other long term (current) drug therapy: Secondary | ICD-10-CM | POA: Diagnosis not present

## 2019-04-18 NOTE — Telephone Encounter (Signed)
Reaching out to patient to offer assistance regarding upcoming cardiac imaging study; pt verbalizes understanding of appt date/time, parking situation and where to check in, pre-test NPO status and medications ordered, and verified current allergies; name and call back number provided for further questions should they arise Shandra Szymborski RN Navigator Cardiac Imaging [facility] [phone] office [phone] cell 

## 2019-04-18 NOTE — Telephone Encounter (Signed)
Patient has an appointment scheduled with Dr Jerilee Hoh.

## 2019-04-19 ENCOUNTER — Ambulatory Visit (HOSPITAL_COMMUNITY)
Admission: RE | Admit: 2019-04-19 | Discharge: 2019-04-19 | Disposition: A | Discharge status: Home / Self Care | Payer: Medicare HMO | Source: Ambulatory Visit | Attending: Cardiology | Admitting: Cardiology

## 2019-04-19 ENCOUNTER — Other Ambulatory Visit: Payer: Self-pay

## 2019-04-19 DIAGNOSIS — I4819 Other persistent atrial fibrillation: Secondary | ICD-10-CM | POA: Diagnosis not present

## 2019-04-19 MED ORDER — IOHEXOL 350 MG/ML SOLN
80.0000 mL | Freq: Once | INTRAVENOUS | Status: AC | PRN
  Administered 2019-04-19: 16:00:00 80 mL via INTRAVENOUS

## 2019-04-20 ENCOUNTER — Telehealth: Payer: Self-pay | Admitting: Cardiology

## 2019-04-20 NOTE — Telephone Encounter (Signed)
Patient is calling to inform Dr. Nicholes Rough from [facility] Rheumatology took blood work from the patient recently and stated he can call her if he would like to discuss it at  [phone].

## 2019-04-20 NOTE — Telephone Encounter (Signed)
Forwarding to Dr. Curt Bears for advisement before calling pt back to discuss further

## 2019-04-20 NOTE — Telephone Encounter (Signed)
New message   Pt c/o medication issue:  1. Name of Medication: Z- pack  2. How are you currently taking this medication (dosage and times per day)? n/a  3. Are you having a reaction (difficulty breathing--STAT)? no  4. What is your medication issue? Patient was prescribed Zpack for pleural effusion. Patient wants to know if needs to take this antibiotic; upcoming procedure 12/22

## 2019-04-20 NOTE — Telephone Encounter (Signed)
Pt informed Dr. Curt Bears going to review tomorrow and let him know. Pt aware to start whatever is recommended for the effusion (Z pack). Patient verbalized understanding and agreeable to plan.

## 2019-04-21 NOTE — Telephone Encounter (Signed)
Dr. Curt Bears reviewed chart.  Pt made aware to proceed as planned with ablation next week. Patient verbalized understanding and agreeable to plan.

## 2019-04-22 NOTE — Telephone Encounter (Signed)
attempted to reach Rheumatology, they close at noon Friday's.  Will attempt again next week.

## 2019-04-23 ENCOUNTER — Other Ambulatory Visit (HOSPITAL_COMMUNITY)
Admission: RE | Admit: 2019-04-23 | Discharge: 2019-04-23 | Disposition: A | Discharge status: Home / Self Care | Payer: Medicare HMO | Source: Ambulatory Visit | Attending: Cardiology | Admitting: Cardiology

## 2019-04-23 DIAGNOSIS — Z01812 Encounter for preprocedural laboratory examination: Secondary | ICD-10-CM | POA: Diagnosis not present

## 2019-04-23 DIAGNOSIS — Z20828 Contact with and (suspected) exposure to other viral communicable diseases: Secondary | ICD-10-CM | POA: Diagnosis not present

## 2019-04-23 LAB — SARS CORONAVIRUS 2 (TAT 6-24 HRS): SARS Coronavirus 2: NEGATIVE

## 2019-04-25 ENCOUNTER — Telehealth: Payer: Self-pay | Admitting: Emergency Medicine

## 2019-04-25 ENCOUNTER — Telehealth: Payer: Self-pay | Admitting: Cardiology

## 2019-04-25 DIAGNOSIS — E785 Hyperlipidemia, unspecified: Secondary | ICD-10-CM

## 2019-04-25 NOTE — Telephone Encounter (Signed)
Called patient informed him per CT results and Dr. Agustin Cree patient needs fasting lipid panel. Patient verbally understood, no further questions.

## 2019-04-25 NOTE — Telephone Encounter (Signed)
New Message  Patient is calling in to get instructions and what he should and should not be doing for the ablation that is scheduled for 04/26/2019.  Please give patient a call back to advise.

## 2019-04-25 NOTE — Telephone Encounter (Signed)
Spoke to rheumatology office.  They will send to rheumatologist to review request.

## 2019-04-25 NOTE — Telephone Encounter (Signed)
Reviewed instructions w/ pt who verbalized understanding.

## 2019-04-26 ENCOUNTER — Encounter (HOSPITAL_COMMUNITY)
Admission: RE | Disposition: A | Discharge status: Home / Self Care | Payer: Self-pay | Source: Home / Self Care | Attending: Cardiology

## 2019-04-26 ENCOUNTER — Other Ambulatory Visit: Payer: Self-pay

## 2019-04-26 ENCOUNTER — Ambulatory Visit (HOSPITAL_COMMUNITY): Payer: Medicare HMO | Admitting: Certified Registered"

## 2019-04-26 ENCOUNTER — Ambulatory Visit (HOSPITAL_COMMUNITY)
Admission: RE | Admit: 2019-04-26 | Discharge: 2019-04-26 | Disposition: A | Discharge status: Home / Self Care | Payer: Medicare HMO | Attending: Cardiology | Admitting: Cardiology

## 2019-04-26 DIAGNOSIS — Z6830 Body mass index (BMI) 30.0-30.9, adult: Secondary | ICD-10-CM | POA: Diagnosis not present

## 2019-04-26 DIAGNOSIS — I10 Essential (primary) hypertension: Secondary | ICD-10-CM | POA: Insufficient documentation

## 2019-04-26 DIAGNOSIS — Z89411 Acquired absence of right great toe: Secondary | ICD-10-CM | POA: Diagnosis not present

## 2019-04-26 DIAGNOSIS — R69 Illness, unspecified: Secondary | ICD-10-CM | POA: Diagnosis not present

## 2019-04-26 DIAGNOSIS — G4733 Obstructive sleep apnea (adult) (pediatric): Secondary | ICD-10-CM | POA: Insufficient documentation

## 2019-04-26 DIAGNOSIS — M069 Rheumatoid arthritis, unspecified: Secondary | ICD-10-CM | POA: Diagnosis not present

## 2019-04-26 DIAGNOSIS — E785 Hyperlipidemia, unspecified: Secondary | ICD-10-CM | POA: Diagnosis not present

## 2019-04-26 DIAGNOSIS — E669 Obesity, unspecified: Secondary | ICD-10-CM | POA: Insufficient documentation

## 2019-04-26 DIAGNOSIS — Z79899 Other long term (current) drug therapy: Secondary | ICD-10-CM | POA: Diagnosis not present

## 2019-04-26 DIAGNOSIS — K219 Gastro-esophageal reflux disease without esophagitis: Secondary | ICD-10-CM | POA: Insufficient documentation

## 2019-04-26 DIAGNOSIS — F17290 Nicotine dependence, other tobacco product, uncomplicated: Secondary | ICD-10-CM | POA: Diagnosis not present

## 2019-04-26 DIAGNOSIS — I4819 Other persistent atrial fibrillation: Secondary | ICD-10-CM | POA: Insufficient documentation

## 2019-04-26 DIAGNOSIS — Z8679 Personal history of other diseases of the circulatory system: Secondary | ICD-10-CM | POA: Insufficient documentation

## 2019-04-26 DIAGNOSIS — G20 Parkinson's disease: Secondary | ICD-10-CM | POA: Insufficient documentation

## 2019-04-26 DIAGNOSIS — R9431 Abnormal electrocardiogram [ECG] [EKG]: Secondary | ICD-10-CM | POA: Diagnosis not present

## 2019-04-26 DIAGNOSIS — Z7901 Long term (current) use of anticoagulants: Secondary | ICD-10-CM | POA: Diagnosis not present

## 2019-04-26 DIAGNOSIS — I480 Paroxysmal atrial fibrillation: Secondary | ICD-10-CM | POA: Diagnosis not present

## 2019-04-26 HISTORY — PX: ATRIAL FIBRILLATION ABLATION: EP1191

## 2019-04-26 LAB — POCT ACTIVATED CLOTTING TIME
Activated Clotting Time: 257 seconds
Activated Clotting Time: 301 seconds
Activated Clotting Time: 323 seconds

## 2019-04-26 SURGERY — ATRIAL FIBRILLATION ABLATION
Anesthesia: General

## 2019-04-26 MED ORDER — ONDANSETRON HCL 4 MG/2ML IJ SOLN
INTRAMUSCULAR | Status: DC | PRN
  Administered 2019-04-26: 4 mg via INTRAVENOUS

## 2019-04-26 MED ORDER — FENTANYL CITRATE (PF) 100 MCG/2ML IJ SOLN
INTRAMUSCULAR | Status: DC | PRN
  Administered 2019-04-26 (×2): 50 ug via INTRAVENOUS

## 2019-04-26 MED ORDER — DOBUTAMINE IN D5W 4-5 MG/ML-% IV SOLN
INTRAVENOUS | Status: AC
  Filled 2019-04-26: qty 250

## 2019-04-26 MED ORDER — HEPARIN (PORCINE) IN NACL 1000-0.9 UT/500ML-% IV SOLN
INTRAVENOUS | Status: AC
  Filled 2019-04-26: qty 500

## 2019-04-26 MED ORDER — DOBUTAMINE IN D5W 4-5 MG/ML-% IV SOLN
INTRAVENOUS | Status: DC | PRN
  Administered 2019-04-26: 20 ug/kg/min via INTRAVENOUS

## 2019-04-26 MED ORDER — PHENYLEPHRINE 40 MCG/ML (10ML) SYRINGE FOR IV PUSH (FOR BLOOD PRESSURE SUPPORT)
PREFILLED_SYRINGE | INTRAVENOUS | Status: DC | PRN
  Administered 2019-04-26 (×2): 120 ug via INTRAVENOUS

## 2019-04-26 MED ORDER — HEPARIN SODIUM (PORCINE) 1000 UNIT/ML IJ SOLN
INTRAMUSCULAR | Status: DC | PRN
  Administered 2019-04-26: 1000 [IU] via INTRAVENOUS

## 2019-04-26 MED ORDER — BUPIVACAINE HCL (PF) 0.25 % IJ SOLN
INTRAMUSCULAR | Status: AC
  Filled 2019-04-26: qty 30

## 2019-04-26 MED ORDER — SODIUM CHLORIDE 0.9% FLUSH: 3.0000 mL | INTRAVENOUS | Status: DC | PRN

## 2019-04-26 MED ORDER — SUGAMMADEX SODIUM 200 MG/2ML IV SOLN
INTRAVENOUS | Status: DC | PRN
  Administered 2019-04-26: 200 mg via INTRAVENOUS

## 2019-04-26 MED ORDER — LIDOCAINE 2% (20 MG/ML) 5 ML SYRINGE
INTRAMUSCULAR | Status: DC | PRN
  Administered 2019-04-26: 100 mg via INTRAVENOUS

## 2019-04-26 MED ORDER — ACETAMINOPHEN 325 MG PO TABS: 650.0000 mg | ORAL_TABLET | ORAL | Status: DC | PRN

## 2019-04-26 MED ORDER — SODIUM CHLORIDE 0.9 % IV SOLN: INTRAVENOUS | Status: DC

## 2019-04-26 MED ORDER — ONDANSETRON HCL 4 MG/2ML IJ SOLN: 4.0000 mg | Freq: Four times a day (QID) | INTRAMUSCULAR | Status: DC | PRN

## 2019-04-26 MED ORDER — ROCURONIUM BROMIDE 50 MG/5ML IV SOSY
PREFILLED_SYRINGE | INTRAVENOUS | Status: DC | PRN
  Administered 2019-04-26 (×2): 50 mg via INTRAVENOUS

## 2019-04-26 MED ORDER — SODIUM CHLORIDE 0.9% FLUSH: 3.0000 mL | Freq: Two times a day (BID) | INTRAVENOUS | Status: DC

## 2019-04-26 MED ORDER — SODIUM CHLORIDE 0.9 % IV SOLN: 250.0000 mL | INTRAVENOUS | Status: DC | PRN

## 2019-04-26 MED ORDER — HEPARIN SODIUM (PORCINE) 1000 UNIT/ML IJ SOLN
INTRAMUSCULAR | Status: AC
  Filled 2019-04-26: qty 1

## 2019-04-26 MED ORDER — DEXAMETHASONE SODIUM PHOSPHATE 10 MG/ML IJ SOLN
INTRAMUSCULAR | Status: DC | PRN
  Administered 2019-04-26: 10 mg via INTRAVENOUS

## 2019-04-26 MED ORDER — HEPARIN SODIUM (PORCINE) 1000 UNIT/ML IJ SOLN
INTRAMUSCULAR | Status: DC | PRN
  Administered 2019-04-26: 14000 [IU] via INTRAVENOUS
  Administered 2019-04-26: 3000 [IU] via INTRAVENOUS
  Administered 2019-04-26: 6000 [IU] via INTRAVENOUS

## 2019-04-26 MED ORDER — HEPARIN (PORCINE) IN NACL 2-0.9 UNITS/ML
INTRAMUSCULAR | Status: AC | PRN
  Administered 2019-04-26 (×5): 500 mL

## 2019-04-26 MED ORDER — PHENYLEPHRINE HCL-NACL 10-0.9 MG/250ML-% IV SOLN
INTRAVENOUS | Status: DC | PRN
  Administered 2019-04-26: 30 ug/min via INTRAVENOUS

## 2019-04-26 MED ORDER — BUPIVACAINE HCL (PF) 0.25 % IJ SOLN
INTRAMUSCULAR | Status: DC | PRN
  Administered 2019-04-26: 30 mL

## 2019-04-26 MED ORDER — PROTAMINE SULFATE 10 MG/ML IV SOLN
INTRAVENOUS | Status: DC | PRN
  Administered 2019-04-26: 50 mg via INTRAVENOUS

## 2019-04-26 MED ORDER — PROPOFOL 10 MG/ML IV BOLUS
INTRAVENOUS | Status: DC | PRN
  Administered 2019-04-26: 130 mg via INTRAVENOUS

## 2019-04-26 SURGICAL SUPPLY — 22 items
BLANKET WARM UNDERBOD FULL ACC (MISCELLANEOUS) ×2 IMPLANT
CATH MAPPNG PENTARAY F 2-6-2MM (CATHETERS) IMPLANT
CATH SMTCH THERMOCOOL SF DF (CATHETERS) ×1 IMPLANT
CATH SOUNDSTAR ECO REPROCESSED (CATHETERS) ×1 IMPLANT
CATH WEBSTER BI DIR CS D-F CRV (CATHETERS) ×1 IMPLANT
COVER SWIFTLINK CONNECTOR (BAG) ×2 IMPLANT
DEVICE CLOSURE PERCLS PRGLD 6F (VASCULAR PRODUCTS) IMPLANT
PACK EP LATEX FREE (CUSTOM PROCEDURE TRAY) ×2
PACK EP LF (CUSTOM PROCEDURE TRAY) ×1 IMPLANT
PAD PRO RADIOLUCENT 2001M-C (PAD) ×2 IMPLANT
PATCH CARTO3 (PAD) ×1 IMPLANT
PENTARAY F 2-6-2MM (CATHETERS) ×2
PERCLOSE PROGLIDE 6F (VASCULAR PRODUCTS) ×8
SHEATH AVANTI 11F 11CM (SHEATH) ×1 IMPLANT
SHEATH BAYLIS SUREFLEX  M 8.5 (SHEATH) ×1
SHEATH BAYLIS SUREFLEX M 8.5 (SHEATH) IMPLANT
SHEATH BAYLIS TRANSSEPTAL 98CM (NEEDLE) ×1 IMPLANT
SHEATH CARTO VIZIGO SM CVD (SHEATH) ×1 IMPLANT
SHEATH PINNACLE 7F 10CM (SHEATH) ×1 IMPLANT
SHEATH PINNACLE 8F 10CM (SHEATH) ×2 IMPLANT
SHEATH PROBE COVER 6X72 (BAG) ×1 IMPLANT
TUBING SMART ABLATE COOLFLOW (TUBING) ×1 IMPLANT

## 2019-04-26 NOTE — Anesthesia Postprocedure Evaluation (Signed)
Anesthesia Post Note  Patient: Samuel Moran  Procedure(s) Performed: ATRIAL FIBRILLATION ABLATION (N/A )     Anesthesia Type: General Level of consciousness: awake and alert Pain management: pain level controlled Vital Signs Assessment: post-procedure vital signs reviewed and stable Respiratory status: spontaneous breathing, nonlabored ventilation and respiratory function stable Cardiovascular status: blood pressure returned to baseline and stable Postop Assessment: no apparent nausea or vomiting Anesthetic complications: no    Last Vitals:  Vitals:   04/26/19 1305 04/26/19 1314  BP: (!) 147/81   Pulse: 72   Resp: (!) 25   Temp:  36.7 C  SpO2: 96%     Last Pain:  Vitals:   04/26/19 1314  TempSrc: Temporal  PainSc:                  Lidia Collum

## 2019-04-26 NOTE — Anesthesia Procedure Notes (Signed)
Procedure Name: Intubation Date/Time: 04/26/2019 9:49 AM Performed by: Lance Coon, CRNA Pre-anesthesia Checklist: Patient identified, Emergency Drugs available, Suction available, Patient being monitored and Timeout performed Patient Re-evaluated:Patient Re-evaluated prior to induction Oxygen Delivery Method: Circle system utilized Preoxygenation: Pre-oxygenation with 100% oxygen Induction Type: IV induction Ventilation: Mask ventilation without difficulty Laryngoscope Size: Miller and 2 Grade View: Grade I Tube type: Oral Tube size: 7.5 mm Number of attempts: 1 Airway Equipment and Method: Stylet Placement Confirmation: ETT inserted through vocal cords under direct vision,  positive ETCO2 and breath sounds checked- equal and bilateral Secured at: 22 cm Tube secured with: Tape Dental Injury: Teeth and Oropharynx as per pre-operative assessment

## 2019-04-26 NOTE — Discharge Instructions (Signed)
Post procedure care instructions No driving for 4 days. No lifting over 5 lbs for 1 week. No vigorous or sexual activity for 1 week. You may return to work/your usual activities on 05/03/2019. Keep procedure site clean & dry. If you notice increased pain, swelling, bleeding or pus, call/return!  You may shower, but no soaking baths/hot tubs/pools for 1 week.    Cardiac Ablation, Care After This sheet gives you information about how to care for yourself after your procedure. Your health care provider may also give you more specific instructions. If you have problems or questions, contact your health care provider. What can I expect after the procedure? After the procedure, it is common to have:  Bruising around your puncture site.  Tenderness around your puncture site.  Skipped heartbeats.  Tiredness (fatigue). Follow these instructions at home: Puncture site care   Follow instructions from your health care provider about how to take care of your puncture site. Make sure you: ? Wash your hands with soap and water before you change your bandage (dressing). If soap and water are not available, use hand sanitizer. ? Change your dressing as told by your health care provider. ? Leave stitches (sutures), skin glue, or adhesive strips in place. These skin closures may need to stay in place for up to 2 weeks. If adhesive strip edges start to loosen and curl up, you may trim the loose edges. Do not remove adhesive strips completely unless your health care provider tells you to do that.  Check your puncture site every day for signs of infection. Check for: ? Redness, swelling, or pain. ? Fluid or blood. If your puncture site starts to bleed, lie down on your back, apply firm pressure to the area, and contact your health care provider. ? Warmth. ? Pus or a bad smell. Driving  Ask your health care provider when it is safe for you to drive again after the procedure.  Do not drive or use heavy  machinery while taking prescription pain medicine.  Do not drive for 24 hours if you were given a medicine to help you relax (sedative) during your procedure. Activity  Avoid activities that take a lot of effort for at least 3 days after your procedure.  Do not lift anything that is heavier than 10 lb (4.5 kg), or the limit that you are told, until your health care provider says that it is safe.  Return to your normal activities as told by your health care provider. Ask your health care provider what activities are safe for you. General instructions  Take over-the-counter and prescription medicines only as told by your health care provider.  Do not use any products that contain nicotine or tobacco, such as cigarettes and e-cigarettes. If you need help quitting, ask your health care provider.  Do not take baths, swim, or use a hot tub until your health care provider approves.  Do not drink alcohol for 24 hours after your procedure.  Keep all follow-up visits as told by your health care provider. This is important. Contact a health care provider if:  You have redness, mild swelling, or pain around your puncture site.  You have fluid or blood coming from your puncture site that stops after applying firm pressure to the area.  Your puncture site feels warm to the touch.  You have pus or a bad smell coming from your puncture site.  You have a fever.  You have chest pain or discomfort that spreads to your  neck, jaw, or arm.  You are sweating a lot.  You feel nauseous.  You have a fast or irregular heartbeat.  You have shortness of breath.  You are dizzy or light-headed and feel the need to lie down.  You have pain or numbness in the arm or leg closest to your puncture site. Get help right away if:  Your puncture site suddenly swells.  Your puncture site is bleeding and the bleeding does not stop after applying firm pressure to the area. These symptoms may represent a  serious problem that is an emergency. Do not wait to see if the symptoms will go away. Get medical help right away. Call your local emergency services (911 in the [place] Do not drive yourself to the hospital. Summary  After the procedure, it is normal to have bruising and tenderness at the puncture site in your groin, neck, or forearm.  Check your puncture site every day for signs of infection.  Get help right away if your puncture site is bleeding and the bleeding does not stop after applying firm pressure to the area. This is a medical emergency. This information is not intended to replace advice given to you by your health care provider. Make sure you discuss any questions you have with your health care provider. Document Released: 07/31/2016 Document Revised: 04/03/2017 Document Reviewed: 07/31/2016 Elsevier Patient Education  2020 [employer].

## 2019-04-26 NOTE — Transfer of Care (Signed)
Immediate Anesthesia Transfer of Care Note  Patient: Samuel Moran  Procedure(s) Performed: ATRIAL FIBRILLATION ABLATION (N/A )  Patient Location: Cath Lab  Anesthesia Type:General  Level of Consciousness: drowsy and patient cooperative  Airway & Oxygen Therapy: Patient Spontanous Breathing  Post-op Assessment: Report given to RN and Post -op Vital signs reviewed and stable  Post vital signs: Reviewed and stable  Last Vitals:  Vitals Value Taken Time  BP 161/98 04/26/19 1233  Temp 36.4 C 04/26/19 1233  Pulse 72 04/26/19 1235  Resp 25 04/26/19 1235  SpO2 94 % 04/26/19 1235  Vitals shown include unvalidated device data.  Last Pain:  Vitals:   04/26/19 1233  TempSrc: Temporal  PainSc: 0-No pain      Patients Stated Pain Goal: 5 ([id number] [phone]  Complications: No apparent anesthesia complications

## 2019-04-26 NOTE — H&P (Signed)
Samuel Moran has presented today for surgery, with the diagnosis of atrial fibrillation.  The various methods of treatment have been discussed with the patient and family. After consideration of risks, benefits and other options for treatment, the patient has consented to  Procedure(s): Catheter ablation as a surgical intervention .  Risks include but not limited to bleeding, tamponade, heart block, stroke, damage to surrounding organs, among others. The patient's history has been reviewed, patient examined, no change in status, stable for surgery.  I have reviewed the patient's chart and labs.  Questions were answered to the patient's satisfaction.    Sedona Wenk Curt Bears, MD 04/26/2019 9:01 AM

## 2019-04-26 NOTE — Anesthesia Preprocedure Evaluation (Signed)
Anesthesia Evaluation  Patient identified by MRN, date of birth, ID band Patient awake    Reviewed: Allergy & Precautions, NPO status , Patient's Chart, lab work & pertinent test results  History of Anesthesia Complications Negative for: history of anesthetic complications  Airway Mallampati: II  TM Distance: >3 FB Neck ROM: Full    Dental  (+) Teeth Intact   Pulmonary sleep apnea , Current Smoker,    Pulmonary exam normal        Cardiovascular hypertension, + dysrhythmias Atrial Fibrillation  Rhythm:Irregular Rate:Normal     Neuro/Psych Parkinson's negative psych ROS   GI/Hepatic Neg liver ROS, GERD  ,  Endo/Other  negative endocrine ROS  Renal/GU negative Renal ROS  negative genitourinary   Musculoskeletal  (+) Arthritis , Rheumatoid disorders,    Abdominal   Peds  Hematology negative hematology ROS (+)   Anesthesia Other Findings Echo 03/24/19: EF 55-60%, mild-mod MR Stress test 04/12/18: normal perfusion, LVEF 59%, normal regional wall motion, low risk study  Reproductive/Obstetrics                             Anesthesia Physical Anesthesia Plan  ASA: III  Anesthesia Plan: General   Post-op Pain Management:    Induction: Intravenous  PONV Risk Score and Plan: 1 and Ondansetron, Dexamethasone, Treatment may vary due to age or medical condition and Midazolam  Airway Management Planned: Oral ETT  Additional Equipment: None  Intra-op Plan:   Post-operative Plan: Extubation in OR  Informed Consent: I have reviewed the patients History and Physical, chart, labs and discussed the procedure including the risks, benefits and alternatives for the proposed anesthesia with the patient or authorized representative who has indicated his/her understanding and acceptance.     Dental advisory given  Plan Discussed with:   Anesthesia Plan Comments:         Anesthesia Quick  Evaluation

## 2019-05-02 ENCOUNTER — Ambulatory Visit: Payer: Medicare HMO | Admitting: Podiatry

## 2019-05-02 DIAGNOSIS — I499 Cardiac arrhythmia, unspecified: Secondary | ICD-10-CM | POA: Diagnosis not present

## 2019-05-02 DIAGNOSIS — R404 Transient alteration of awareness: Secondary | ICD-10-CM | POA: Diagnosis not present

## 2019-05-02 DIAGNOSIS — I469 Cardiac arrest, cause unspecified: Secondary | ICD-10-CM | POA: Diagnosis not present

## 2019-05-03 ENCOUNTER — Telehealth: Payer: Self-pay | Admitting: Cardiology

## 2019-05-03 NOTE — Telephone Encounter (Signed)
Will send this message to Dr. Curt Bears and Venida Jarvis RN as a general FYI. Medical records has already updated this pt as being deceased in his chart.  All future appts appeared to be cancelled out of the system.

## 2019-05-03 NOTE — Telephone Encounter (Signed)
Patients friend, Malachy Mood, wanted me to let Dr. Curt Bears and his nurse know that he passed away yesterday, 05/12/2019. She left her number in case Dr. Curt Bears or his nurse wanted to reach out to her. I advised her that they were both not in today but would send them the message.

## 2019-05-06 DIAGNOSIS — 419620001 Death: Secondary | SNOMED CT | POA: Diagnosis not present

## 2019-05-06 DEATH — deceased

## 2019-05-10 ENCOUNTER — Telehealth: Payer: Self-pay | Admitting: Neurology

## 2019-05-10 NOTE — Telephone Encounter (Signed)
Pat from [facility] was wanting to know if Dr. Carles Collet could sign the death certificate on patient. Please call her back at [phone] and let her know. Thanks!

## 2019-05-10 NOTE — Telephone Encounter (Signed)
Please see note.

## 2019-05-11 NOTE — Telephone Encounter (Signed)
Spoke with Samuel Moran and she is aware

## 2019-05-11 NOTE — Telephone Encounter (Signed)
No.  Since he just had a cardiac procedure immediate prior to his death, it would be more appropriate for cardiology and/or PCP.  His PD was very mild and doubtful played any role

## 2019-05-16 ENCOUNTER — Ambulatory Visit: Payer: Medicare HMO | Admitting: Physical Therapy

## 2019-05-18 ENCOUNTER — Encounter: Payer: Medicare HMO | Admitting: Internal Medicine

## 2019-05-23 ENCOUNTER — Ambulatory Visit: Payer: Medicare HMO | Admitting: Physical Therapy

## 2019-05-24 ENCOUNTER — Ambulatory Visit (HOSPITAL_COMMUNITY): Payer: Medicare HMO | Admitting: Physician Assistant

## 2019-05-30 ENCOUNTER — Ambulatory Visit: Payer: Medicare HMO | Admitting: Cardiology

## 2019-05-30 ENCOUNTER — Ambulatory Visit: Payer: Medicare HMO | Admitting: Physical Therapy

## 2019-06-06 ENCOUNTER — Ambulatory Visit: Payer: Medicare HMO | Admitting: Physical Therapy

## 2019-06-30 ENCOUNTER — Ambulatory Visit: Payer: Medicare HMO | Admitting: Neurology

## 2019-08-02 ENCOUNTER — Ambulatory Visit: Payer: Medicare HMO | Admitting: Cardiology

## 2020-10-23 IMAGING — DX DG CHEST 2V
2 series · 2 of 2 positions shown · non-contrast
Comparison: 04/17/2015

CLINICAL DATA: Cardioversion, increased shortness of breath

EXAM:
CHEST - 2 VIEW

[w chest decub]
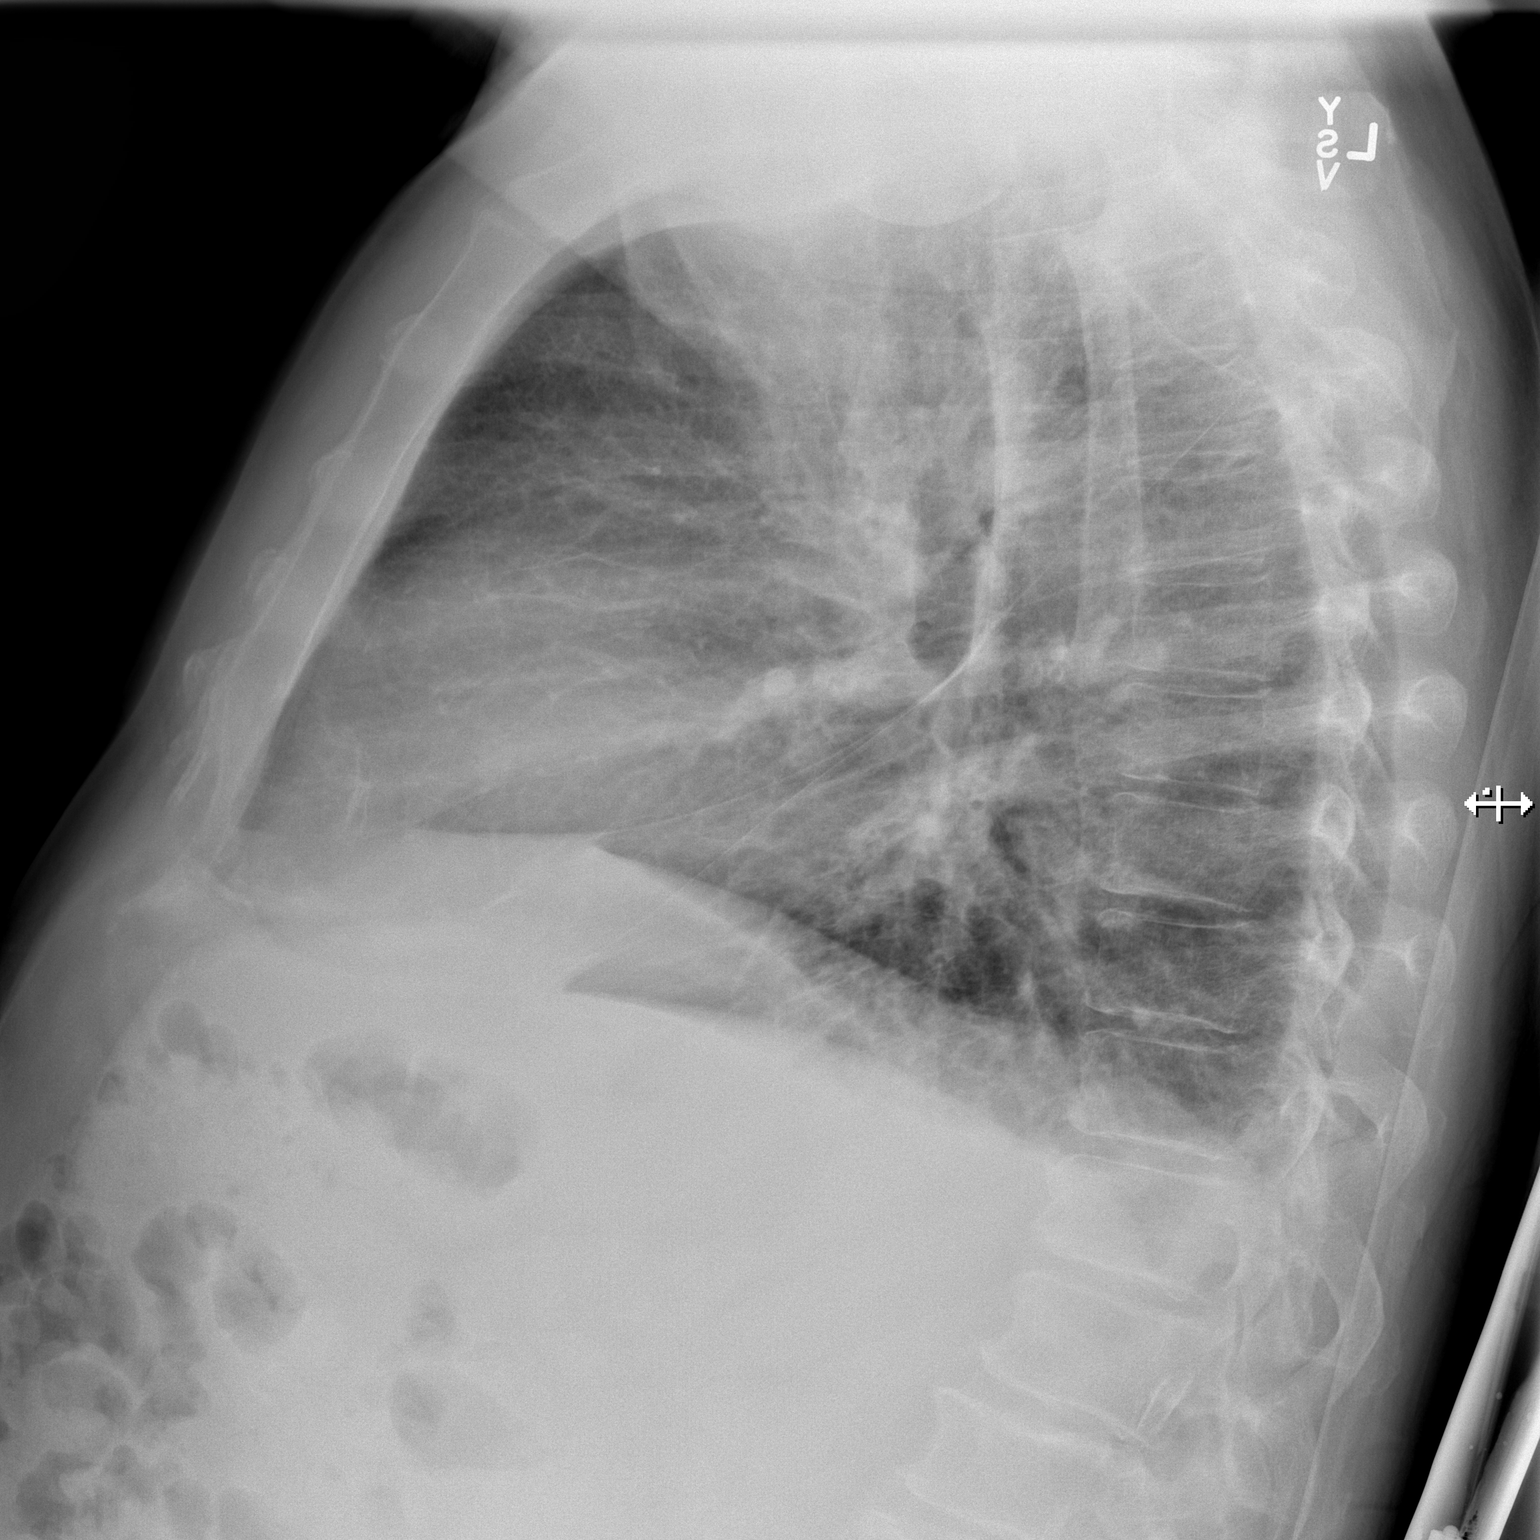

[x chest ap]
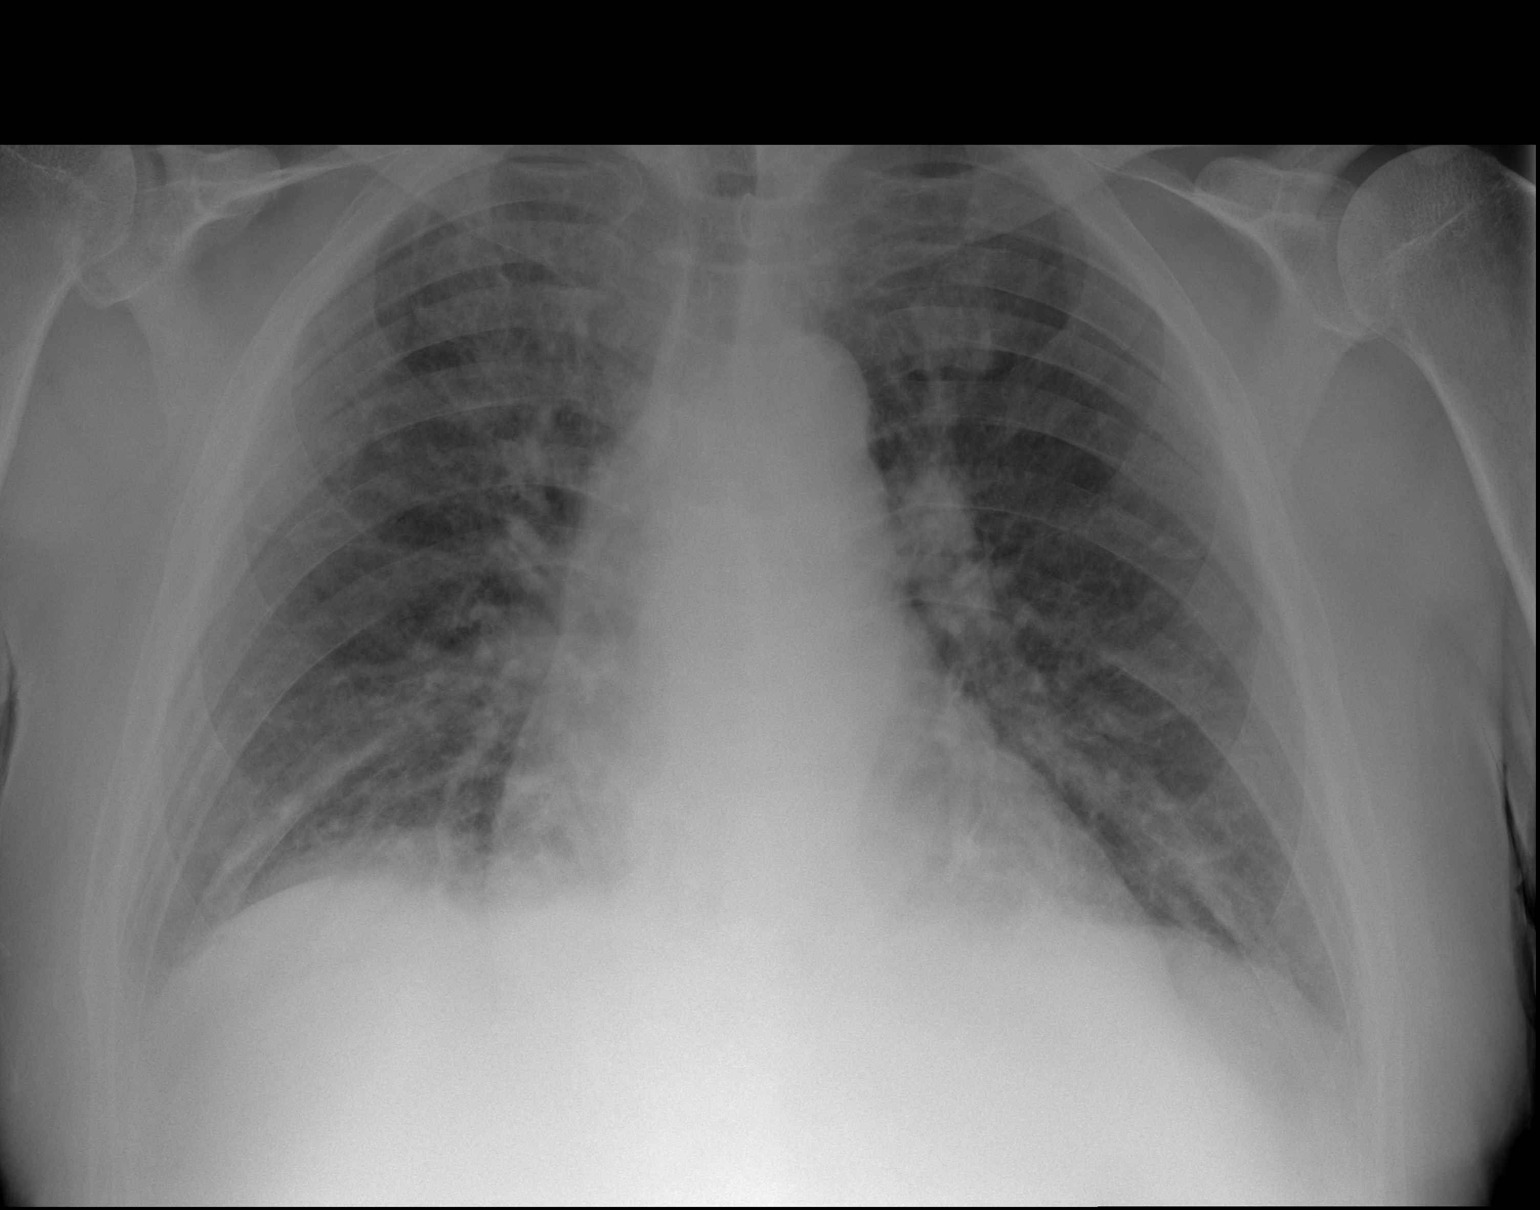

[2 of 2 positions shown; findings below may reference images not displayed]

FINDINGS: Cardiomegaly. Pulmonary vascular prominence and diffuse bilateral
interstitial opacity. Probable trace pleural effusions. The
visualized skeletal structures are unremarkable.
IMPRESSION: 1. Pulmonary vascular prominence and diffuse bilateral interstitial
opacity. Probable trace pleural effusions. Findings are consistent
with edema.

2.  Cardiomegaly.
